# Patient Record
Sex: Female | Born: 1965
Health system: Southern US, Community
[De-identification: ages and names within clinical notes are randomized; demographics above are authoritative.]

## PROBLEM LIST (undated history)

## (undated) DIAGNOSIS — Z923 Personal history of irradiation: Secondary | ICD-10-CM

## (undated) DIAGNOSIS — F329 Major depressive disorder, single episode, unspecified: Secondary | ICD-10-CM

## (undated) DIAGNOSIS — Z86010 Personal history of colonic polyps: Secondary | ICD-10-CM

## (undated) DIAGNOSIS — M1990 Unspecified osteoarthritis, unspecified site: Secondary | ICD-10-CM

## (undated) DIAGNOSIS — R232 Flushing: Secondary | ICD-10-CM

## (undated) DIAGNOSIS — F32A Depression, unspecified: Secondary | ICD-10-CM

## (undated) DIAGNOSIS — C50411 Malignant neoplasm of upper-outer quadrant of right female breast: Secondary | ICD-10-CM

## (undated) DIAGNOSIS — I10 Essential (primary) hypertension: Secondary | ICD-10-CM

## (undated) DIAGNOSIS — T7840XA Allergy, unspecified, initial encounter: Secondary | ICD-10-CM

## (undated) HISTORY — PX: ABDOMINAL HYSTERECTOMY: SHX81

## (undated) HISTORY — DX: Allergy, unspecified, initial encounter: T78.40XA

## (undated) HISTORY — DX: Personal history of irradiation: Z92.3

## (undated) HISTORY — DX: Malignant neoplasm of upper-outer quadrant of right female breast: C50.411

## (undated) HISTORY — DX: Personal history of colonic polyps: Z86.010

## (undated) HISTORY — DX: Flushing: R23.2

## (undated) HISTORY — PX: COLONOSCOPY: SHX174

---

## 1998-09-13 ENCOUNTER — Emergency Department (HOSPITAL_COMMUNITY): Admission: EM | Admit: 1998-09-13 | Discharge: 1998-09-13 | Payer: Self-pay | Admitting: Internal Medicine

## 1998-12-07 ENCOUNTER — Other Ambulatory Visit: Admission: RE | Admit: 1998-12-07 | Discharge: 1998-12-07 | Payer: Self-pay | Admitting: *Deleted

## 1999-11-08 ENCOUNTER — Inpatient Hospital Stay (HOSPITAL_COMMUNITY): Admission: AD | Admit: 1999-11-08 | Discharge: 1999-11-08 | Payer: Self-pay | Admitting: *Deleted

## 1999-11-20 ENCOUNTER — Inpatient Hospital Stay (HOSPITAL_COMMUNITY): Admission: AD | Admit: 1999-11-20 | Discharge: 1999-11-20 | Payer: Self-pay | Admitting: Obstetrics and Gynecology

## 2000-01-05 ENCOUNTER — Other Ambulatory Visit: Admission: RE | Admit: 2000-01-05 | Discharge: 2000-01-05 | Payer: Self-pay | Admitting: *Deleted

## 2000-02-24 ENCOUNTER — Inpatient Hospital Stay (HOSPITAL_COMMUNITY): Admission: AD | Admit: 2000-02-24 | Discharge: 2000-02-24 | Payer: Self-pay | Admitting: Obstetrics and Gynecology

## 2000-03-28 ENCOUNTER — Inpatient Hospital Stay (HOSPITAL_COMMUNITY): Admission: AD | Admit: 2000-03-28 | Discharge: 2000-03-28 | Payer: Self-pay | Admitting: Obstetrics and Gynecology

## 2001-01-17 ENCOUNTER — Emergency Department (HOSPITAL_COMMUNITY): Admission: EM | Admit: 2001-01-17 | Discharge: 2001-01-17 | Payer: Self-pay | Admitting: Emergency Medicine

## 2001-02-27 ENCOUNTER — Other Ambulatory Visit: Admission: RE | Admit: 2001-02-27 | Discharge: 2001-02-27 | Payer: Self-pay | Admitting: *Deleted

## 2001-03-24 ENCOUNTER — Emergency Department (HOSPITAL_COMMUNITY): Admission: EM | Admit: 2001-03-24 | Discharge: 2001-03-24 | Payer: Self-pay | Admitting: Emergency Medicine

## 2001-04-02 ENCOUNTER — Emergency Department (HOSPITAL_COMMUNITY): Admission: EM | Admit: 2001-04-02 | Discharge: 2001-04-02 | Payer: Self-pay | Admitting: Emergency Medicine

## 2002-03-15 ENCOUNTER — Other Ambulatory Visit: Admission: RE | Admit: 2002-03-15 | Discharge: 2002-03-15 | Payer: Self-pay | Admitting: *Deleted

## 2003-08-19 ENCOUNTER — Other Ambulatory Visit: Admission: RE | Admit: 2003-08-19 | Discharge: 2003-08-19 | Payer: Self-pay | Admitting: *Deleted

## 2005-08-15 ENCOUNTER — Other Ambulatory Visit: Admission: RE | Admit: 2005-08-15 | Discharge: 2005-08-15 | Payer: Self-pay | Admitting: Obstetrics and Gynecology

## 2006-05-16 ENCOUNTER — Encounter: Admission: RE | Admit: 2006-05-16 | Discharge: 2006-05-16 | Payer: Self-pay | Admitting: Occupational Medicine

## 2006-08-28 ENCOUNTER — Other Ambulatory Visit: Admission: RE | Admit: 2006-08-28 | Discharge: 2006-08-28 | Payer: Self-pay | Admitting: Obstetrics and Gynecology

## 2006-10-04 ENCOUNTER — Encounter (INDEPENDENT_AMBULATORY_CARE_PROVIDER_SITE_OTHER): Payer: Self-pay | Admitting: Obstetrics and Gynecology

## 2006-10-04 ENCOUNTER — Ambulatory Visit (HOSPITAL_COMMUNITY): Admission: RE | Admit: 2006-10-04 | Discharge: 2006-10-04 | Payer: Self-pay | Admitting: Obstetrics and Gynecology

## 2008-05-15 ENCOUNTER — Encounter (INDEPENDENT_AMBULATORY_CARE_PROVIDER_SITE_OTHER): Payer: Self-pay | Admitting: Obstetrics and Gynecology

## 2008-05-15 ENCOUNTER — Ambulatory Visit (HOSPITAL_COMMUNITY): Admission: RE | Admit: 2008-05-15 | Discharge: 2008-05-16 | Payer: Self-pay | Admitting: Obstetrics and Gynecology

## 2009-06-20 ENCOUNTER — Emergency Department (HOSPITAL_BASED_OUTPATIENT_CLINIC_OR_DEPARTMENT_OTHER): Admission: EM | Admit: 2009-06-20 | Discharge: 2009-06-20 | Payer: Self-pay | Admitting: Emergency Medicine

## 2010-05-30 ENCOUNTER — Encounter: Payer: Self-pay | Admitting: Obstetrics and Gynecology

## 2010-07-13 ENCOUNTER — Emergency Department (HOSPITAL_BASED_OUTPATIENT_CLINIC_OR_DEPARTMENT_OTHER)
Admission: EM | Admit: 2010-07-13 | Discharge: 2010-07-13 | Disposition: A | Discharge status: Home / Self Care | Payer: 59 | Attending: Emergency Medicine | Admitting: Emergency Medicine

## 2010-07-13 DIAGNOSIS — I8390 Asymptomatic varicose veins of unspecified lower extremity: Secondary | ICD-10-CM | POA: Insufficient documentation

## 2010-08-23 LAB — DIFFERENTIAL
Eosinophils Absolute: 0.1 10*3/uL (ref 0.0–0.7)
Monocytes Absolute: 0.4 10*3/uL (ref 0.1–1.0)
Neutrophils Relative %: 57 % (ref 43–77)

## 2010-08-23 LAB — COMPREHENSIVE METABOLIC PANEL
ALT: 36 U/L — ABNORMAL HIGH (ref 0–35)
AST: 45 U/L — ABNORMAL HIGH (ref 0–37)
Alkaline Phosphatase: 41 U/L (ref 39–117)
BUN: 13 mg/dL (ref 6–23)
Calcium: 9.4 mg/dL (ref 8.4–10.5)
Glucose, Bld: 93 mg/dL (ref 70–99)
Sodium: 137 mEq/L (ref 135–145)

## 2010-08-23 LAB — CBC
HCT: 33.4 % — ABNORMAL LOW (ref 36.0–46.0)
HCT: 37.3 % (ref 36.0–46.0)
MCHC: 32.8 g/dL (ref 30.0–36.0)
MCHC: 32.8 g/dL (ref 30.0–36.0)
MCV: 82.3 fL (ref 78.0–100.0)
MCV: 82.4 fL (ref 78.0–100.0)
MCV: 83.9 fL (ref 78.0–100.0)
Platelets: 286 10*3/uL (ref 150–400)
RBC: 3.71 MIL/uL — ABNORMAL LOW (ref 3.87–5.11)
RBC: 4.06 MIL/uL (ref 3.87–5.11)
WBC: 11.5 10*3/uL — ABNORMAL HIGH (ref 4.0–10.5)

## 2010-08-23 LAB — BASIC METABOLIC PANEL
BUN: 5 mg/dL — ABNORMAL LOW (ref 6–23)
CO2: 26 mEq/L (ref 19–32)
Chloride: 104 mEq/L (ref 96–112)
Creatinine, Ser: 0.66 mg/dL (ref 0.4–1.2)

## 2010-09-21 NOTE — Op Note (Signed)
[name], Lindsay [name]                 ACCOUNT NO.:  [id number]   MEDICAL RECORD NO.:  [id number]          PATIENT TYPE:  OIB   LOCATION:  9318                          FACILITY:  [facility]   PHYSICIAN:  Malachi Pro. Ambrose Mantle, M.D. DATE OF BIRTH:  [birth year]   DATE OF PROCEDURE:  05/15/2008  DATE OF DISCHARGE:                               OPERATIVE REPORT   PREOPERATIVE DIAGNOSES:  Menorrhagia, dysmenorrhea, abnormal uterine  bleeding, fibroids, probable adenomyosis, prior endometrial ablation.   POSTOPERATIVE DIAGNOSES:  Menorrhagia, dysmenorrhea, abnormal uterine  bleeding, fibroids, probable adenomyosis, prior endometrial ablation.   OPERATION:  Vaginal hysterectomy.   OPERATOR:  Malachi Pro. Ambrose Mantle, MD   ASSISTANT:  Zenaida Niece, MD   ANESTHESIA:  General anesthesia.   The patient was noted to have a low potassium of 2.8, so she was begun  on potassium intravenously.  She was brought to the operating room,  placed under satisfactory general anesthesia, and placed in lithotomy  position.  The vulva, vagina, perineum, and medial thighs were prepped  with Betadine solution.  Urethra was prepped and a Foley catheter was  inserted to straight drain.  Exam revealed the uterus to be mobile,  posterior upper limit of normal size, the adnexa were free of masses.  The area was draped as a sterile field.  The cervix was identified,  grasped anteriorly and posteriorly with Lahey clamps, and a dilute  solution of Neo-Synephrine was injected at the cervicovaginal junction.  A circumferential incision was made around the cervix.  The anterior  vaginal mucosa was pushed ahead.  A plane was developed over the  anterior peritoneum.  I made one attempt to enter.  I did not enter the  peritoneum, turned my attention posteriorly, and was able to enter the  posterior cul-de-sac.  I clamped, cut, and suture ligated both  uterosacral ligaments and cardinal ligaments.  I then clamped, cut, and  suture ligated  the parametrial tissues still above the uterine vessels,  inverted the uterus through the incision and the cul-de-sac, and clamped  across both upper pedicles requiring two clamps on the right and one on  the left.  The uterus was removed.  It did seem to be one and a half  times normal size and slightly irregular with what was thought to be  fibroids.  The ovaries dropped into the operative field and were  actually somewhat of a burden to keep out of the way.  The upper  pedicles that have been clamped across were doubly suture ligated.  Hemostasis was achieved just at the mesovarium with two  sutures of 3-0  Vicryl.  Additional hemostasis was obtained on both sides with  interrupted figure-of-eight sutures of 0 Vicryl.  I had placed a Bonnano  retractor in when I got into the peritoneal cavity.  I removed it and  sutured the posterior vaginal mucosa to the posterior peritoneum from  uterosacral ligament.  I then searched for more hemostasis, realized  that some of the bleeding was coming from the anterior vagina.  This was  controlled and hemostasis seemed complete.  The anterior peritoneum was  then sutured in sort of a pursestring fashion starting anteriorly and  suturing that to the uterosacral ligaments bilaterally back to the  anterior peritoneum.  This was tied down closing the peritoneal cavity.  The vaginal mucosa was then reunited in the midline after the  uterosacral ligaments were tied  together, and after the figure-of-eight sutures in a vertical fashion,  closed the vaginal cuff.  Hemostasis was complete, the procedure was  terminated, and the patient was returned to recovery in satisfactory  condition. The ovaries were normal.      Malachi Pro. Ambrose Mantle, M.D.  Electronically Signed     TFH/MEDQ  D:  05/15/2008  T:  05/16/2008  Job:  [id number]

## 2010-09-21 NOTE — H&P (Signed)
Lindsay [name], Lindsay [name]                 ACCOUNT NO.:  [id number]   MEDICAL RECORD NO.:  [id number]          PATIENT TYPE:  AMB   LOCATION:  SDC                           FACILITY:  [facility]   PHYSICIAN:  Malachi Pro. Ambrose Mantle, M.D. DATE OF BIRTH:  07/15/1965   DATE OF ADMISSION:  05/15/2008  DATE OF DISCHARGE:                              HISTORY & PHYSICAL   HISTORY OF PRESENT ILLNESS:  This is a 45 year old black married female,  para 3-0-0-3, who is admitted to the hospital for hysterectomy because  of significant dysmenorrhea, menorrhagia and abnormal uterine bleeding  after an unsuccessful endometrial ablation in 2008.  Last menstrual  period was May 03, 2008, lasted 9 days.  The previous period  approximately March 23, 2008, lasted approximately 9 days.  The  patient states that her bleeding is heavier than before she had the  ablation and the ablation was done because of heavy periods.  She states  that her periods interfere with her life.  She cannot predict when her  menses will come.  She bleeds onto her clothes and bleeds onto her bed  sheets.  She states that her periods usually last about 9 days, but  there has been intramenstrual bleeding.  She rates her pain with her  periods as moderate.  She does not have dyspareunia, although she has  not had sex in months.  I initially saw her in March 2009, at which time  she complained of spotting and bleeding for 2 weeks.  Examination showed  a slightly enlarged uterus that was posterior.  Pap smear was normal and  STD panel was negative.  She declined an endometrial biopsy, but  returned in April 2009, for an endometrial biopsy, but I could not admit  a #7, #8 or #9 dilator, and I could not admit a Pipelle into her  endometrial cavity.  She then underwent an ultrasound exam which showed  two small fibroids, one 2.5 x 2.5 cm and another approximately 2 cm and  an endometrial thickness of 2.36 mm.  I viewed the ultrasound pictures  with  her and offered her three options; observation for recurrent  bleeding abnormalities, D and C under anesthesia to see if a cervical  canal could be established or hysterectomy.  She called later and wanted  to proceed with hysterectomy.   PAST MEDICAL HISTORY:   ALLERGIES:  NO KNOWN DRUG ALLERGIES.   PAST SURGICAL HISTORY:  1. Endometrial ablation.  2. Tubal ligation.   ILLNESSES:  High blood pressure.   REVIEW OF SYSTEMS:  No heart, lung, bowel or urinary problems.  She does  not drink or smoke.  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   FAMILY HISTORY:  Father died with an unknown history.  Her mother is 63  and healthy.  She has three siblings, 40, 41 and 43, all healthy.  Three  children ages 9 to 109 are healthy.   MEDICATIONS:  Hydrochlorothiazide and formerly she was on lisinopril,  and it is possible that she is still on lisinopril.  I will have  to  confirm that at the time of admission.   PHYSICAL EXAMINATION:  GENERAL:  A well-developed obese black female in  no distress.  VITAL SIGNS:  Blood pressure 150/90, pulse 99, weight is 218.5 pounds.  HEENT:  Revealed prominent eyes, but extraocular movements were intact.  Nose and pharynx are clear.  NECK:  Supple without thyromegaly.  BREASTS:  Soft without masses.  HEART:  Normal size and sounds, no murmurs.  LUNGS:  Clear to auscultation.  The patient does have a narrow oral  cavity.  ABDOMEN:  Soft and obese.  No masses are palpable.  Liver, spleen and  kidneys are not felt.  There is no tenderness.  Vulva and vagina are  clean.  The cervix is clean.  Uterus is posterior, upper limit of normal  size.  The adnexa are clear.  Rectovaginal exam confirms.  No cul-de-sac  scarring present.   ADMITTING IMPRESSION:  Persistent menorrhagia and dysmenorrhea, abnormal  uterine bleeding, status post ablation.  Hypertension.  Probable  adenomyosis and small fibroids.  The patient is admitted for vaginal  hysterectomy.  If  the surgery cannot be completed vaginally, I will  proceed with abdominal hysterectomy.  The patient has been counseled  about the risks of surgery, including, but not limited to heart attack,  stroke, pulmonary embolus, wound disruption, hemorrhage with the need  for reoperation and/or transfusion, fistula formation, nerve injury and  intestinal obstruction.  She has also been counseled about the fact that  the surgery could have an unpredictable impact on her sex drive and  performance.      Malachi Pro. Ambrose Mantle, M.D.  Electronically Signed     TFH/MEDQ  D:  05/14/2008  T:  05/14/2008  Job:  [id number]

## 2010-09-21 NOTE — Discharge Summary (Signed)
Lindsay [name], Lindsay [name]                 ACCOUNT NO.:  [id number]   MEDICAL RECORD NO.:  [id number]          PATIENT TYPE:  OIB   LOCATION:  9318                          [facility]ACILITY:  [facility]   PHYSICIAN:  Malachi Pro. Ambrose Mantle, M.D. DATE OF BIRTH:  [birth year]   DATE OF ADMISSION:  05/15/2008  DATE OF DISCHARGE:                               DISCHARGE SUMMARY   A 45 year old black female who was admitted with menorrhagia,  dysmenorrhea, abnormal uterine bleeding, fibroids, probable adenomyosis,  and prior endometrial ablation for vaginal hysterectomy.  The patient  underwent a vaginal hysterectomy by Dr. Ambrose Mantle with Dr. Jackelyn Knife  assisting under general anesthesia.  The uterus was thought to be about  1-1/2 times normal size felt like it had small fibroids.  These could  have been adenomyomas.  The proximal portion of the left tube was also  removed.  Postoperatively, the patient did well.  On the first postop  day, she ambulated well, tolerated a diet, voided well, and was ready  for discharge.   Laboratory data on admission showed a potassium of 2.8.  Her SGOT and PT  were 45 and 36, bilirubin was 1.  Her white count was 5400, hemoglobin  12.2, hematocrit 37.3, platelet count 362,000, normal differential.  Pregnancy test was negative.  Followup hemoglobin the evening of the  surgery was 11.1, potassium was 3.8 on the first postop day and  hemoglobin on the first postop day was 10.2, hematocrit 31.1.  Pathology  report is pending.  The ovaries appeared normal at the time of the  surgery.  Tube showed evidence of prior tubal ligation.   FINAL DIAGNOSES:  Menorrhagia, dysmenorrhea, abnormal uterine bleeding,  probable fibroids, probable adenomyosis, history of endometrial  ablation.   OPERATION:  Vaginal hysterectomy and removal of proximal segment of the  left fallopian tube.   FINAL CONDITION:  Improved.   INSTRUCTIONS:  Our regular discharge instructions.  No vaginal entrance,  no heavy  lifting or strenuous activity.  Call with temperature elevation  greater than 100.4 degrees.  Call with heavy vaginal bleeding.   MEDICATIONS:  1. Percocet 5/325, 36 tablets 1 every 4-6 hours as needed for pain.  2. Motrin 600 mg 30 tablets 1 every 6 hours as needed for pain.   The patient is advised to inform her medical doctor that her potassium  was 2.8 on admission and the consideration should be given to using  potassium supplementation along with the hydrochlorothiazide.  The  patient is to return in 1-2 weeks for followup examination.      Malachi Pro. Ambrose Mantle, M.D.  Electronically Signed     TFH/MEDQ  D:  05/16/2008  T:  05/17/2008  Job:  [id number]

## 2010-09-24 NOTE — H&P (Signed)
Lindsay [name], Lindsay [name]                 ACCOUNT NO.:  [id number]   MEDICAL RECORD NO.:  [id number]          PATIENT TYPE:  AMB   LOCATION:  SDC                           [facility]ACILITY:  [facility]   PHYSICIAN:  James A. Ashley Royalty, M.D.DATE OF BIRTH:  1965-07-20   DATE OF ADMISSION:  DATE OF DISCHARGE:                              HISTORY & PHYSICAL   HISTORY OF PRESENT ILLNESS:  The patient is a 45 year old gravida 3 para  3 who complains of menorrhagia and metrorrhagia.  She is status post  tubal sterilization procedure.   Ultrasound was performed on August 31, 2006, and revealed several small  fibroids, the largest of which was 2.6 cm in greatest diameter.  The  patient also had a 2.2-cm echo-free right adnexal cyst.  A  sonohysterogram revealed no significant structural abnormality within  the uterine cavity.  The patient is for diagnostic hysteroscopy with  NovaSure endometrial ablation.   MEDICATIONS:  Hydrochlorothiazide.   PAST MEDICAL HISTORY:  Hypertension.   PAST SURGICAL HISTORY:  Negative.   ALLERGIES:  None.   FAMILY HISTORY:  Positive for breast cancer and diabetes.   SOCIAL HISTORY:  The patient denies use of tobacco or significant  alcohol.   REVIEW OF SYSTEMS:  Noncontributory.   PHYSICAL EXAMINATION:  GENERAL:  A well-developed, well-nourished,  pleasant black female in no acute distress.  VITAL SIGNS:  Afebrile.  Vital signs stable.  CHEST:  Lungs are clear.  CARDIAC:  Regular rate and rhythm.  ABDOMEN:  Soft and nontender.  PELVIC:  External genitalia within normal limits.  Vagina and cervix are  without gross lesions.  Bimanual examination reveals the uterus to be  approximately 9 x 6 x 6 cm and no adnexal masses are palpable.  Please  most recent office evaluation.   IMPRESSION:  1. Fibroid uterus without any obvious submucosal component on      sonohysterogram.  2. Right adnexal cyst - probably physiologic.  3. Menorrhagia/metrorrhagia.  4. Anemia - probably  secondary to #3.   PLAN:  Diagnostic hysteroscopy and NovaSure endometrial ablation.  The  risks, benefits, complications, and alternatives will be discussed with  the patient.  The possibility of operative hysteroscopy was discussed  and accepted.  Questions were invited and answered.      James A. Ashley Royalty, M.D.  Electronically Signed     JAM/MEDQ  D:  10/04/2006  T:  10/04/2006  Job:  [id number]

## 2010-09-24 NOTE — Op Note (Signed)
[name], Lindsay [name]                 ACCOUNT NO.:  [id number]   MEDICAL RECORD NO.:  [id number]          PATIENT TYPE:  AMB   LOCATION:  SDC                           [facility]   PHYSICIAN:  James A. Ashley Royalty, M.D.DATE OF BIRTH:  February 11, 1966   DATE OF PROCEDURE:  10/04/2006  DATE OF DISCHARGE:                               OPERATIVE REPORT   PREOPERATIVE DIAGNOSIS:  Menorrhagia.   POSTOPERATIVE DIAGNOSIS:  Menorrhagia.   PROCEDURE:  1. Diagnostic hysteroscopy.  2. Dilatation curettage.  3. Novasure endometrial ablation.   SURGEON:  Rudy Jew. Ashley Royalty, M.D.   ANESTHESIA:  General, 1% Xylocaine paracervical block.   ESTIMATED BLOOD LOSS:  Minimal.   SPECIMENS:  Uterine curettings.   COMPLICATIONS:  None.   PACKS DRAINS:  None.   PROCEDURE:  The patient is taken to the operating room, placed in the  dorsal supine position.  After general anesthetic was administered, she  was placed in the lithotomy position and prepped and draped in usual  manner for vaginal surgery.  Posterior weighted retractor was placed per  vagina.  The anterior lip of cervix grasped with single-tooth tenaculum.  Uterus was gently sounded to 9 cm and noted to be midplane.  Next a  Hegar dilator was inserted into the cervix and advanced to the level of  the internal os.  Measurement was taken and it was 4.5 cm to the  internal os.  No actual dilatation was required to place the Hegar  dilator.  Next the hysteroscope was placed in the uterine cavity using  sorbitol as a distension medium.  The uterine cavity was thoroughly  inspected.  The left and right tubal ostia were visualized.  The cavity  itself appeared to be without any evidence of structural abnormality.  The cervix was also visualized and also noted to be unremarkable.  Appropriate photographs were obtained.  The uterine cavity was then  flushed with the sodium chloride in preparation for the upcoming  Novasure endometrial ablation.  The  hysteroscope was removed.  Next the  uterine curettage was performed with a medium size curette.  The  curettings were submitted to pathology for histologic studies.   Attention was then turned to the Novasure endometrial ablation.  The  Novasure apparatus was inserted into the uterine cavity for the package  directions.  It was seated appropriately.  The endometrial ablation was  then carried out at a wattage of approximately 87 watts,  width 3.5 cm  and cavity length 4.5 cm.  The ablation lasted approximately 2  minutes.  The instruments were was removed.  Small cervical laceration  was easily closed with 2-0 chromic catgut.  Hemostasis was noted and the  procedure terminated.   The patient tolerated the procedure extremely well and was returned to  the recovery room in good condition.      James A. Ashley Royalty, M.D.  Electronically Signed     JAM/MEDQ  D:  10/04/2006  T:  10/04/2006  Job:  [id number]

## 2012-11-01 ENCOUNTER — Emergency Department (HOSPITAL_BASED_OUTPATIENT_CLINIC_OR_DEPARTMENT_OTHER): Payer: 59

## 2012-11-01 ENCOUNTER — Encounter (HOSPITAL_BASED_OUTPATIENT_CLINIC_OR_DEPARTMENT_OTHER): Payer: Self-pay | Admitting: *Deleted

## 2012-11-01 ENCOUNTER — Emergency Department (HOSPITAL_BASED_OUTPATIENT_CLINIC_OR_DEPARTMENT_OTHER)
Admission: EM | Admit: 2012-11-01 | Discharge: 2012-11-01 | Disposition: A | Discharge status: Home / Self Care | Payer: 59 | Attending: Emergency Medicine | Admitting: Emergency Medicine

## 2012-11-01 DIAGNOSIS — Z79899 Other long term (current) drug therapy: Secondary | ICD-10-CM | POA: Insufficient documentation

## 2012-11-01 DIAGNOSIS — I10 Essential (primary) hypertension: Secondary | ICD-10-CM | POA: Insufficient documentation

## 2012-11-01 DIAGNOSIS — M62838 Other muscle spasm: Secondary | ICD-10-CM | POA: Insufficient documentation

## 2012-11-01 HISTORY — DX: Essential (primary) hypertension: I10

## 2012-11-01 MED ORDER — DIAZEPAM 5 MG PO TABS
5.0000 mg | ORAL_TABLET | Freq: Once | ORAL | Status: AC
  Administered 2012-11-01: 5 mg via ORAL
  Filled 2012-11-01: qty 1

## 2012-11-01 MED ORDER — HYDROCODONE-ACETAMINOPHEN 5-325 MG PO TABS: 1.0000 | ORAL_TABLET | ORAL | Status: DC | PRN

## 2012-11-01 MED ORDER — DIAZEPAM 5 MG PO TABS: 5.0000 mg | ORAL_TABLET | Freq: Two times a day (BID) | ORAL | Status: DC

## 2012-11-01 NOTE — ED Notes (Signed)
Pt c/o neck pain which radiates down into both shoulder  X 4 days

## 2012-11-01 NOTE — ED Provider Notes (Signed)
   History    CSN: [id number] Arrival date & time 11/01/12  1456  First MD Initiated Contact with Patient 11/01/12 1524     Chief Complaint  Patient presents with  . Neck Pain   (Consider location/radiation/quality/duration/timing/severity/associated sxs/prior Treatment) HPI Comments: Pt states that she has been having neck pain for 4 days:no known injury:pt states that she is unable to rotate her shoulder to the left  Patient is a 47 y.o. female presenting with neck pain. The history is provided by the patient. No language interpreter was used.  Neck Pain Pain location:  L side and R side Quality:  Aching Pain radiates to:  Does not radiate Pain severity:  Moderate Pain is:  Same all the time Timing:  Constant Progression:  Unchanged Relieved by:  Nothing  Past Medical History  Diagnosis Date  . Hypertension    Past Surgical History  Procedure Laterality Date  . Abdominal hysterectomy     History reviewed. No pertinent family history. History  Substance Use Topics  . Smoking status: Never Smoker   . Smokeless tobacco: Not on file  . Alcohol Use: No   OB History   Grav Para Term Preterm Abortions TAB SAB Ect Mult Living                 Review of Systems  Constitutional: Negative.   HENT: Positive for neck pain.   Respiratory: Negative.   Cardiovascular: Negative.     Allergies  Review of patient's allergies indicates no known allergies.  Home Medications   Current Outpatient Rx  Name  Route  Sig  Dispense  Refill  . fexofenadine (ALLEGRA) 180 MG tablet   Oral   Take 180 mg by mouth daily.         Marland Kitchen lisinopril-hydrochlorothiazide (PRINZIDE,ZESTORETIC) 20-12.5 MG per tablet   Oral   Take 1 tablet by mouth daily.          BP 140/83  Pulse 90  Temp(Src) 98.6 F (37 C) (Oral)  Resp 16  Ht 5\' 7"  (1.702 m)  Wt 230 lb (104.327 kg)  BMI 36.01 kg/m2  SpO2 100% Physical Exam  Nursing note and vitals reviewed. Constitutional: She is oriented to  person, place, and time. She appears well-developed and well-nourished.  Cardiovascular: Normal rate and regular rhythm.   Pulmonary/Chest: Effort normal and breath sounds normal.  Musculoskeletal:  Cervical paraspinal tenderness with decrease rom  Neurological: She is alert and oriented to person, place, and time.  Skin: Skin is warm and dry.  Psychiatric: She has a normal mood and affect.    ED Course  Procedures (including critical care time) Labs Reviewed - No data to display Dg Cervical Spine Complete  11/01/2012   *RADIOLOGY REPORT*  Clinical Data: Posterior and left-sided neck pain.  CERVICAL SPINE - COMPLETE 4+ VIEW  Comparison: None.  Findings: There is slight reversal of the normal cervical lordosis. Minimal anterior osteophytes at C3, C4, and at C5-6 and C7-T1.  No disc space narrowing.  No facet arthritis or foraminal stenosis. No prevertebral soft tissue swelling.  IMPRESSION: No significant abnormality.   Original Report Authenticated By: Francene Boyers, M.D.   1. Muscle spasms of neck     MDM  Will treat for muscle spasms:pt not having any neuro deficits:will have follow up with DR. Vivi Barrack, NP 11/01/12 1652

## 2012-11-01 NOTE — ED Provider Notes (Signed)
Medical screening examination/treatment/procedure(s) were performed by non-physician practitioner and as supervising physician I was immediately available for consultation/collaboration.   Yancey Pedley, MD 11/01/12 1739 

## 2012-11-03 ENCOUNTER — Emergency Department (HOSPITAL_BASED_OUTPATIENT_CLINIC_OR_DEPARTMENT_OTHER)
Admission: EM | Admit: 2012-11-03 | Discharge: 2012-11-03 | Disposition: A | Discharge status: Home / Self Care | Payer: 59 | Attending: Emergency Medicine | Admitting: Emergency Medicine

## 2012-11-03 ENCOUNTER — Encounter (HOSPITAL_BASED_OUTPATIENT_CLINIC_OR_DEPARTMENT_OTHER): Payer: Self-pay | Admitting: *Deleted

## 2012-11-03 DIAGNOSIS — M7912 Myalgia of auxiliary muscles, head and neck: Secondary | ICD-10-CM

## 2012-11-03 DIAGNOSIS — Z79899 Other long term (current) drug therapy: Secondary | ICD-10-CM | POA: Insufficient documentation

## 2012-11-03 DIAGNOSIS — M62838 Other muscle spasm: Secondary | ICD-10-CM | POA: Insufficient documentation

## 2012-11-03 DIAGNOSIS — I10 Essential (primary) hypertension: Secondary | ICD-10-CM | POA: Insufficient documentation

## 2012-11-03 MED ORDER — NAPROXEN 375 MG PO TABS: 375.0000 mg | ORAL_TABLET | Freq: Two times a day (BID) | ORAL | Status: DC | PRN

## 2012-11-03 MED ORDER — HYDROMORPHONE HCL PF 1 MG/ML IJ SOLN
1.0000 mg | Freq: Once | INTRAMUSCULAR | Status: AC
  Administered 2012-11-03: 1 mg via INTRAMUSCULAR
  Filled 2012-11-03: qty 1

## 2012-11-03 MED ORDER — KETOROLAC TROMETHAMINE 15 MG/ML IJ SOLN
15.0000 mg | Freq: Once | INTRAMUSCULAR | Status: AC
  Administered 2012-11-03: 15 mg via INTRAMUSCULAR
  Filled 2012-11-03: qty 1

## 2012-11-03 NOTE — ED Notes (Signed)
Neck pain, was seen a few days ago and meds are not working

## 2012-11-03 NOTE — ED Provider Notes (Signed)
History    47 year old female with back pain. Gradual onset a few days ago. Pain is in the left neck. It is worse with movement. Mild ache at rest. Patient is seen in the emergency room and prescribed Vicodin and Valium. She's been taking this with only mild relief. She denies any trauma. No fevers or chills. No numbness, tingling or loss of strength. No history of cardiac surgery. No use of blood thinning medication.  CSN: [id number] Arrival date & time 11/03/12  0940  First MD Initiated Contact with Patient 11/03/12 1006     Chief Complaint  Patient presents with  . Neck Pain   (Consider location/radiation/quality/duration/timing/severity/associated sxs/prior Treatment) HPI Past Medical History  Diagnosis Date  . Hypertension    Past Surgical History  Procedure Laterality Date  . Abdominal hysterectomy     No family history on file. History  Substance Use Topics  . Smoking status: Never Smoker   . Smokeless tobacco: Not on file  . Alcohol Use: No   OB History   Grav Para Term Preterm Abortions TAB SAB Ect Mult Living                 Review of Systems All systems reviewed and negative, other than as noted in HPI.   Allergies  Review of patient's allergies indicates no known allergies.  Home Medications   Current Outpatient Rx  Name  Route  Sig  Dispense  Refill  . diazepam (VALIUM) 5 MG tablet   Oral   Take 1 tablet (5 mg total) by mouth 2 (two) times daily.   10 tablet   0   . fexofenadine (ALLEGRA) 180 MG tablet   Oral   Take 180 mg by mouth daily.         Marland Kitchen HYDROcodone-acetaminophen (NORCO/VICODIN) 5-325 MG per tablet   Oral   Take 1 tablet by mouth every 4 (four) hours as needed for pain.   10 tablet   0   . lisinopril-hydrochlorothiazide (PRINZIDE,ZESTORETIC) 20-12.5 MG per tablet   Oral   Take 1 tablet by mouth daily.          BP 141/79  Pulse 66  Resp 18  SpO2 100% Physical Exam  Nursing note and vitals reviewed. Constitutional:  She is oriented to person, place, and time. She appears well-developed and well-nourished. No distress.  HENT:  Head: Normocephalic and atraumatic.  Tenderness along the left sternocleidomastoid muscle. No overlying skin changes. No adenopathy. No nuchal rigidity. Carotid arteries palpable bilaterally and symmetric. No bruit or thrill. Posterior pharynx is clear. Normal stomach phonation. Handling secretions. Submental tissues are soft. No tongue elevation. No midline spinal tenderness. Patient reports increased neck pain with flexion and rotation to the right.  Eyes: Conjunctivae are normal. Right eye exhibits no discharge. Left eye exhibits no discharge.  Neck: Neck supple.  Cardiovascular: Normal rate, regular rhythm and normal heart sounds.  Exam reveals no gallop and no friction rub.   No murmur heard. Pulmonary/Chest: Effort normal and breath sounds normal. No respiratory distress.  Abdominal: Soft. She exhibits no distension. There is no tenderness.  Musculoskeletal: She exhibits no edema and no tenderness.  Neurological: She is alert and oriented to person, place, and time. No cranial nerve deficit. She exhibits normal muscle tone. Coordination normal.  Strength is 5 out of 5 viral upper extremities. Biceps reflexes 1+ bilaterally. Easily palpable radial pulses bilaterally which feel symmetric.  Skin: Skin is warm and dry.  Psychiatric: She has a normal  mood and affect. Her behavior is normal. Thought content normal.    ED Course  Procedures (including critical care time) Labs Reviewed - No data to display Dg Cervical Spine Complete  11/01/2012   *RADIOLOGY REPORT*  Clinical Data: Posterior and left-sided neck pain.  CERVICAL SPINE - COMPLETE 4+ VIEW  Comparison: None.  Findings: There is slight reversal of the normal cervical lordosis. Minimal anterior osteophytes at C3, C4, and at C5-6 and C7-T1.  No disc space narrowing.  No facet arthritis or foraminal stenosis. No prevertebral soft  tissue swelling.  IMPRESSION: No significant abnormality.   Original Report Authenticated By: Francene Boyers, M.D.   1. Sternocleidomastoid muscle tenderness     MDM  47yF with continued neck pain. Tenderness along course of L sternocleidomastoid. No nuchal rigidity. Nonfocal neuro exam. No trauma. Suspicion for emergent etiology such as meningitis, carotid/vertebral artery dissection, deep space neck infection, etc. Is low. Plan continued symptomatic tx. Return precautions discussed.   Raeford Razor, MD 11/05/12 [phone]

## 2012-11-06 ENCOUNTER — Ambulatory Visit (INDEPENDENT_AMBULATORY_CARE_PROVIDER_SITE_OTHER): Payer: 59 | Admitting: Family Medicine

## 2012-11-06 ENCOUNTER — Encounter: Payer: Self-pay | Admitting: Family Medicine

## 2012-11-06 VITALS — BP 120/83 | HR 69 | Ht 67.0 in | Wt 220.0 lb

## 2012-11-06 DIAGNOSIS — M542 Cervicalgia: Secondary | ICD-10-CM | POA: Insufficient documentation

## 2012-11-06 NOTE — Assessment & Plan Note (Signed)
2/2 cervical strain.  Start advil regularly for 1 week with food.  Simple ROM exercises.  Will wait over next week before starting PT as she feels a lot better today.  Discussed ergonomic issues.  F/u prn.

## 2012-11-06 NOTE — Patient Instructions (Addendum)
You've suffered a cervical strain. Take advil 600mg  three times a day with food x 1 week then as needed. If not improving call me - we can keep you out of work longer and add physical therapy. Muscle relaxants and pain medication hasn't seemed to help you and don't make you better faster from this. Consider cervical collar if severely painful. Simple range of motion exercises within limits of pain to prevent further stiffness. Heat 15 minutes at a time 3-4 times a day to help with spasms. Watch head position when on computers, texting, when sleeping in bed - should in line with back to prevent further nerve traction and irritation. Follow up with me as needed.

## 2012-11-06 NOTE — Progress Notes (Signed)
Patient ID: Lindsay [name], female   DOB: 04/06/1966, 47 y.o.   MRN: [id number]  PCP: Aura Dials, MD  Subjective:   HPI: Patient is a 47 y.o. female here for neck pain.  Patient reports 8 days ago she woke up with pain in both sides of neck L > R. Thought she slept wrong but pain intensified over next few days causing a couple visits to the ED. Was given valium, hydrocodone and neither of these helped much. Advil has helped some. No radiation of pain into arms. No numbness or tingling. No prior neck problems. No bowel/bladder dysfunction. Feels much better this morning compared to the past 2 days.  Past Medical History  Diagnosis Date  . Hypertension     Current Outpatient Prescriptions on File Prior to Visit  Medication Sig Dispense Refill  . lisinopril-hydrochlorothiazide (PRINZIDE,ZESTORETIC) 20-12.5 MG per tablet Take 1 tablet by mouth daily.      . diazepam (VALIUM) 5 MG tablet Take 1 tablet (5 mg total) by mouth 2 (two) times daily.  10 tablet  0  . fexofenadine (ALLEGRA) 180 MG tablet Take 180 mg by mouth daily.      Marland Kitchen HYDROcodone-acetaminophen (NORCO/VICODIN) 5-325 MG per tablet Take 1 tablet by mouth every 4 (four) hours as needed for pain.  10 tablet  0  . naproxen (NAPROSYN) 375 MG tablet Take 1 tablet (375 mg total) by mouth 2 (two) times daily as needed.  20 tablet  0   No current facility-administered medications on file prior to visit.    Past Surgical History  Procedure Laterality Date  . Abdominal hysterectomy      No Known Allergies  History   Social History  . Marital Status: Married    Spouse Name: N/A    Number of Children: N/A  . Years of Education: N/A   Occupational History  . Not on file.   Social History Main Topics  . Smoking status: Never Smoker   . Smokeless tobacco: Not on file  . Alcohol Use: No  . Drug Use: No  . Sexually Active: No   Other Topics Concern  . Not on file   Social History Narrative  . No narrative on file     Family History  Problem Relation Age of Onset  . Sudden death Neg Hx   . Hypertension Neg Hx   . Diabetes Neg Hx   . Heart attack Neg Hx   . Hyperlipidemia Neg Hx     BP 120/83  Pulse 69  Ht 5\' 7"  (1.702 m)  Wt 220 lb (99.791 kg)  BMI 34.45 kg/m2  Review of Systems: See HPI above.    Objective:  Physical Exam:  Gen: NAD  Neck: No gross deformity, swelling, bruising. Mild TTP left cervical paraspinal region.  No SCM, other shoulder or neck TTP.  No midline/bony TTP. FROM neck - mild pain on flexion, left lateral rotation. BUE strength 5/5.   Sensation intact to light touch.   2+ equal reflexes in triceps, biceps, brachioradialis tendons. Negative spurlings. NV intact distal BUEs.    Bilateral shoulders: No swelling, ecchymoses.  No gross deformity. No TTP. FROM with negative painful arc. Negative Hawkins. Negative Yergasons. Strength 5/5 with empty can and resisted internal/external rotation. Negative apprehension. NV intact distally.  Assessment & Plan:  1. Neck pain - 2/2 cervical strain.  Start advil regularly for 1 week with food.  Simple ROM exercises.  Will wait over next week before starting PT as she  feels a lot better today.  Discussed ergonomic issues.  F/u prn.

## 2012-11-07 ENCOUNTER — Other Ambulatory Visit: Payer: Self-pay

## 2014-01-31 ENCOUNTER — Encounter: Payer: Self-pay | Admitting: Family Medicine

## 2014-02-11 ENCOUNTER — Encounter: Payer: Self-pay | Admitting: Internal Medicine

## 2014-04-02 ENCOUNTER — Ambulatory Visit (AMBULATORY_SURGERY_CENTER): Payer: Self-pay | Admitting: *Deleted

## 2014-04-02 VITALS — Ht 66.0 in | Wt 226.0 lb

## 2014-04-02 DIAGNOSIS — Z1211 Encounter for screening for malignant neoplasm of colon: Secondary | ICD-10-CM

## 2014-04-02 NOTE — Progress Notes (Signed)
No egg or soy allergy. ewm No issues with sedation. ewm No diet pills, no blood thinners. ewm No home 02 use. ewm emmi video to pt's e mail. ewm

## 2014-04-16 ENCOUNTER — Ambulatory Visit (AMBULATORY_SURGERY_CENTER): Payer: 59 | Admitting: Internal Medicine

## 2014-04-16 ENCOUNTER — Encounter: Payer: Self-pay | Admitting: Internal Medicine

## 2014-04-16 VITALS — BP 127/65 | HR 64 | Temp 98.4°F | Resp 28 | Ht 66.0 in | Wt 226.0 lb

## 2014-04-16 DIAGNOSIS — Z1211 Encounter for screening for malignant neoplasm of colon: Secondary | ICD-10-CM

## 2014-04-16 DIAGNOSIS — D122 Benign neoplasm of ascending colon: Secondary | ICD-10-CM

## 2014-04-16 DIAGNOSIS — D125 Benign neoplasm of sigmoid colon: Secondary | ICD-10-CM

## 2014-04-16 DIAGNOSIS — D124 Benign neoplasm of descending colon: Secondary | ICD-10-CM

## 2014-04-16 MED ORDER — SODIUM CHLORIDE 0.9 % IV SOLN: 500.0000 mL | INTRAVENOUS | Status: DC

## 2014-04-16 NOTE — Patient Instructions (Addendum)
I found and removed 5 small polyps that look benign (not cancer). I will let you know pathology results and when to have another routine colonoscopy by mail.  I appreciate the opportunity to care for you. Gatha Mayer, MD, [facility]  Handout on polyps. Discharge instructions given. Resume previous medications. YOU HAD AN ENDOSCOPIC PROCEDURE TODAY AT [facility]: Refer to the procedure report that was given to you for any specific questions about what was found during the examination.  If the procedure report does not answer your questions, please call your gastroenterologist to clarify.  If you requested that your care partner not be given the details of your procedure findings, then the procedure report has been included in a sealed envelope for you to review at your convenience later.  YOU SHOULD EXPECT: Some feelings of bloating in the abdomen. Passage of more gas than usual.  Walking can help get rid of the air that was put into your GI tract during the procedure and reduce the bloating. If you had a lower endoscopy (such as a colonoscopy or flexible sigmoidoscopy) you may notice spotting of blood in your stool or on the toilet paper. If you underwent a bowel prep for your procedure, then you may not have a normal bowel movement for a few days.  DIET: Your first meal following the procedure should be a light meal and then it is ok to progress to your normal diet.  A half-sandwich or bowl of soup is an example of a good first meal.  Heavy or fried foods are harder to digest and may make you feel nauseous or bloated.  Likewise meals heavy in dairy and vegetables can cause extra gas to form and this can also increase the bloating.  Drink plenty of fluids but you should avoid alcoholic beverages for 24 hours.  ACTIVITY: Your care partner should take you home directly after the procedure.  You should plan to take it easy, moving slowly for the rest of the day.  You can resume  normal activity the day after the procedure however you should NOT DRIVE or use heavy machinery for 24 hours (because of the sedation medicines used during the test).    SYMPTOMS TO REPORT IMMEDIATELY: A gastroenterologist can be reached at any hour.  During normal business hours, 8:30 AM to 5:00 PM Monday through Friday, call [phone].  After hours and on weekends, please call the GI answering service at [phone] who will take a message and have the physician on call contact you.   Following lower endoscopy (colonoscopy or flexible sigmoidoscopy):  Excessive amounts of blood in the stool  Significant tenderness or worsening of abdominal pains  Swelling of the abdomen that is new, acute  Fever of 100F or higher  FOLLOW UP: If any biopsies were taken you will be contacted by phone or by letter within the next 1-3 weeks.  Call your gastroenterologist if you have not heard about the biopsies in 3 weeks.  Our staff will call the home number listed on your records the next business day following your procedure to check on you and address any questions or concerns that you may have at that time regarding the information given to you following your procedure. This is a courtesy call and so if there is no answer at the home number and we have not heard from you through the emergency physician on call, we will assume that you have returned to your regular daily  activities without incident.  SIGNATURES/CONFIDENTIALITY: You and/or your care partner have signed paperwork which will be entered into your electronic medical record.  These signatures attest to the fact that that the information above on your After Visit Summary has been reviewed and is understood.  Full responsibility of the confidentiality of this discharge information lies with you and/or your care-partner.

## 2014-04-16 NOTE — Op Note (Signed)
[place]  [address] [place], [phone]   COLONOSCOPY PROCEDURE REPORT  PATIENT: Lindsay, [name]  MR#: [id number] BIRTHDATE: 1965/12/15 , 48  yrs. old GENDER: female ENDOSCOPIST: Gatha Mayer, MD, [facility] PROCEDURE DATE:  04/16/2014 PROCEDURE:   Colonoscopy with biopsy and Colonoscopy with snare polypectomy First Screening Colonoscopy - Avg.  risk and is 50 yrs.  old or older Yes.  Prior Negative Screening - Now for repeat screening. N/A  History of Adenoma - Now for follow-up colonoscopy & has been > or = to 3 yrs.  N/A  Polyps Removed Today? Yes. ASA CLASS:   Class II INDICATIONS:first colonoscopy and average risk for colorectal cancer. MEDICATIONS: Propofol 400 mg IV and Monitored anesthesia care  DESCRIPTION OF PROCEDURE:   After the risks benefits and alternatives of the procedure were thoroughly explained, informed consent was obtained.  The digital rectal exam revealed no abnormalities of the rectum.   The LB [id number]  endoscope was introduced through the anus and advanced to the cecum, which was identified by both the appendix and ileocecal valve. No adverse events experienced.   The quality of the prep was good, using MiraLax  The instrument was then slowly withdrawn as the colon was fully examined.  COLON FINDINGS: Five polyps were found. A diminutive ascending polyp removed completely with cold forceps. Three descending and 1 sigmoid cold snared and completely removed. All diminutive and all sent to pathology.  The examination was otherwise normal. Retroflexed rectal and right colon views revealed no abnormalities. The time to cecum=2 minutes 04 seconds.  Withdrawal time=14 minutes 49 seconds.  The scope was withdrawn and the procedure completed. COMPLICATIONS: There were no immediate complications.  ENDOSCOPIC IMPRESSION: 1.   Five polyps were found and removed, all diminutive 2.   The examination was otherwise normal w/ good prep -  first screening  RECOMMENDATIONS: Timing of repeat colonoscopy will be determined by pathology findings. eSigned:  Gatha Mayer, MD, [facility] 04/16/2014 9:46 AM cc: Bernerd Limbo, MD and The Patient

## 2014-04-16 NOTE — Progress Notes (Signed)
Called to room to assist during endoscopic procedure.  Patient ID and intended procedure confirmed with present staff. Received instructions for my participation in the procedure from the performing physician.  

## 2014-04-16 NOTE — Progress Notes (Signed)
A/ox3, pleased with MAC, report to RN 

## 2014-04-17 ENCOUNTER — Telehealth: Payer: Self-pay | Admitting: *Deleted

## 2014-04-17 NOTE — Telephone Encounter (Signed)
Message left

## 2014-04-25 ENCOUNTER — Encounter: Payer: Self-pay | Admitting: Internal Medicine

## 2014-04-25 DIAGNOSIS — Z86010 Personal history of colonic polyps: Secondary | ICD-10-CM

## 2014-04-25 DIAGNOSIS — Z860101 Personal history of adenomatous and serrated colon polyps: Secondary | ICD-10-CM

## 2014-04-25 HISTORY — DX: Personal history of adenomatous and serrated colon polyps: Z86.0101

## 2014-04-25 HISTORY — DX: Personal history of colonic polyps: Z86.010

## 2014-04-25 NOTE — Progress Notes (Signed)
Quick Note:  4 small adenomas - repeat colon 2019 ______

## 2014-05-09 HISTORY — PX: KNEE ARTHROSCOPY: SUR90

## 2014-08-27 ENCOUNTER — Emergency Department (HOSPITAL_BASED_OUTPATIENT_CLINIC_OR_DEPARTMENT_OTHER)
Admission: EM | Admit: 2014-08-27 | Discharge: 2014-08-27 | Disposition: A | Discharge status: Home / Self Care | Payer: 59 | Attending: Emergency Medicine | Admitting: Emergency Medicine

## 2014-08-27 ENCOUNTER — Encounter (HOSPITAL_BASED_OUTPATIENT_CLINIC_OR_DEPARTMENT_OTHER): Payer: Self-pay

## 2014-08-27 ENCOUNTER — Emergency Department (HOSPITAL_BASED_OUTPATIENT_CLINIC_OR_DEPARTMENT_OTHER): Payer: 59

## 2014-08-27 DIAGNOSIS — M1990 Unspecified osteoarthritis, unspecified site: Secondary | ICD-10-CM | POA: Diagnosis not present

## 2014-08-27 DIAGNOSIS — Z86010 Personal history of colonic polyps: Secondary | ICD-10-CM | POA: Insufficient documentation

## 2014-08-27 DIAGNOSIS — Z79899 Other long term (current) drug therapy: Secondary | ICD-10-CM | POA: Diagnosis not present

## 2014-08-27 DIAGNOSIS — Z791 Long term (current) use of non-steroidal anti-inflammatories (NSAID): Secondary | ICD-10-CM | POA: Diagnosis not present

## 2014-08-27 DIAGNOSIS — M25562 Pain in left knee: Secondary | ICD-10-CM | POA: Insufficient documentation

## 2014-08-27 DIAGNOSIS — I10 Essential (primary) hypertension: Secondary | ICD-10-CM | POA: Diagnosis not present

## 2014-08-27 HISTORY — DX: Unspecified osteoarthritis, unspecified site: M19.90

## 2014-08-27 MED ORDER — OXYCODONE-ACETAMINOPHEN 5-325 MG PO TABS: 1.0000 | ORAL_TABLET | ORAL | Status: DC | PRN

## 2014-08-27 MED ORDER — NAPROXEN 500 MG PO TABS: 500.0000 mg | ORAL_TABLET | Freq: Two times a day (BID) | ORAL | Status: DC

## 2014-08-27 MED ORDER — LIDOCAINE 5 % EX PTCH: 1.0000 | MEDICATED_PATCH | CUTANEOUS | Status: DC

## 2014-08-27 NOTE — Discharge Instructions (Signed)

## 2014-08-27 NOTE — ED Notes (Signed)
Pt reports left knee pain that started yesterday.  Reports possible injury will working out.  Pt report swelling.  Pt ambulatory in triage.

## 2014-08-27 NOTE — ED Provider Notes (Signed)
CSN: [id number]     Arrival date & time 08/27/14  1954 History   First MD Initiated Contact with Patient 08/27/14 2026     Chief Complaint  Patient presents with  . Knee Pain     (Consider location/radiation/quality/duration/timing/severity/associated sxs/prior Treatment) Patient is a 49 y.o. female presenting with knee pain.  Knee Pain Location:  Knee Time since incident:  1 day Injury: no   Knee location:  L knee Pain details:    Quality:  Aching and pressure   Radiates to:  Does not radiate   Severity:  Moderate   Onset quality:  Gradual   Duration:  1 day   Timing:  Constant   Progression:  Worsening Chronicity:  Recurrent Dislocation: no   Prior injury to area:  No Relieved by:  Nothing Ineffective treatments:  Movement Associated symptoms: decreased ROM, stiffness and swelling   Associated symptoms: no back pain, no fatigue, no fever, no itching, no muscle weakness, no neck pain, no numbness and no tingling   Risk factors comment:  HX of OA. Recently took up [employer]. Has been did  a lot of lunges 2 days ago befor the swelling began.   Past Medical History  Diagnosis Date  . Hypertension   . Allergy     seasonal  . Hx of adenomatous colonic polyps 04/25/2014  . Arthritis    Past Surgical History  Procedure Laterality Date  . Abdominal hysterectomy     Family History  Problem Relation Age of Onset  . Sudden death Neg Hx   . Hypertension Neg Hx   . Diabetes Neg Hx   . Heart attack Neg Hx   . Hyperlipidemia Neg Hx   . Colon cancer Neg Hx   . Esophageal cancer Neg Hx   . Rectal cancer Neg Hx   . Stomach cancer Neg Hx    History  Substance Use Topics  . Smoking status: Never Smoker   . Smokeless tobacco: Never Used  . Alcohol Use: No   OB History    No data available     Review of Systems  Constitutional: Negative for fever and fatigue.  Musculoskeletal: Positive for joint swelling, gait problem and stiffness. Negative for back pain and neck  pain.  Skin: Negative for itching.      Allergies  Pollen extract  Home Medications   Prior to Admission medications   Medication Sig Start Date End Date Taking? Authorizing Provider  EPINEPHrine (EPIPEN 2-PAK) 0.3 mg/0.3 mL IJ SOAJ injection  01/01/14   Historical Provider, MD  fexofenadine (ALLEGRA) 180 MG tablet Take 180 mg by mouth daily.    Historical Provider, MD  lisinopril-hydrochlorothiazide (PRINZIDE,ZESTORETIC) 20-12.5 MG per tablet Take 1 tablet by mouth daily.    Historical Provider, MD  naproxen (NAPROSYN) 500 MG tablet Take 1 tablet (500 mg total) by mouth 2 (two) times daily with a meal. 08/27/14   Kyllian Clingerman, PA-C   BP 125/64 mmHg  Pulse 90  Temp(Src) 98.9 F (37.2 C) (Oral)  Resp 18  Ht 5\' 6"  (1.676 m)  Wt 215 lb (97.523 kg)  BMI 34.72 kg/m2  SpO2 100% Physical Exam  Constitutional: She is oriented to person, place, and time. She appears well-developed and well-nourished. No distress.  HENT:  Head: Normocephalic and atraumatic.  Eyes: Conjunctivae are normal. No scleral icterus.  Neck: Normal range of motion.  Cardiovascular: Normal rate, regular rhythm and normal heart sounds.  Exam reveals no gallop and no friction rub.   No  murmur heard. Pulmonary/Chest: Effort normal and breath sounds normal. No respiratory distress.  Abdominal: Soft. Bowel sounds are normal. She exhibits no distension and no mass. There is no tenderness. There is no guarding.  Musculoskeletal:  Knee exam: left positive for moderate crepitations, some mild tenderness and pain on range of motion, minimal effusion is present, no pseudo laxity noted.   Neurological: She is alert and oriented to person, place, and time.  Skin: Skin is warm and dry. She is not diaphoretic.    ED Course  Procedures (including critical care time) Labs Review Labs Reviewed - No data to display  Imaging Review Dg Knee 2 Views Left  08/27/2014   CLINICAL DATA:  49 year old female with left knee pain   EXAM: LEFT KNEE - 1-2 VIEW  COMPARISON:  None  FINDINGS: No acute fracture, malalignment or knee joint effusion. Tricompartmental osteoarthritis with early osteophyte formation. Suspect chondromalacia patella. No lytic or blastic osseous lesion.  IMPRESSION: No acute fracture or joint effusion.  Mild tricompartmental degenerative osteoarthritis.   Electronically Signed   By: Jacqulynn Cadet M.D.   On: 08/27/2014 21:45     EKG Interpretation None      MDM   Final diagnoses:  Left knee pain    Patient X-Ray negative for obvious fracture or dislocation. Pain managed in ED. Pt advised to follow up with orthopedics if symptoms persist for possibility of missed fracture diagnosis. Patient given brace while in ED, conservative therapy recommended and discussed. Patient will be dc home & is agreeable with above plan.     Margarita Mail, PA-C 09/02/14 [place], MD 09/02/14 [phone]

## 2015-03-03 ENCOUNTER — Emergency Department (HOSPITAL_BASED_OUTPATIENT_CLINIC_OR_DEPARTMENT_OTHER)
Admission: EM | Admit: 2015-03-03 | Discharge: 2015-03-03 | Disposition: A | Discharge status: Home / Self Care | Payer: Managed Care, Other (non HMO) | Attending: Emergency Medicine | Admitting: Emergency Medicine

## 2015-03-03 ENCOUNTER — Emergency Department (HOSPITAL_BASED_OUTPATIENT_CLINIC_OR_DEPARTMENT_OTHER): Payer: Managed Care, Other (non HMO)

## 2015-03-03 ENCOUNTER — Encounter (HOSPITAL_BASED_OUTPATIENT_CLINIC_OR_DEPARTMENT_OTHER): Payer: Self-pay | Admitting: *Deleted

## 2015-03-03 DIAGNOSIS — R110 Nausea: Secondary | ICD-10-CM | POA: Insufficient documentation

## 2015-03-03 DIAGNOSIS — M545 Low back pain, unspecified: Secondary | ICD-10-CM

## 2015-03-03 DIAGNOSIS — Z791 Long term (current) use of non-steroidal anti-inflammatories (NSAID): Secondary | ICD-10-CM | POA: Insufficient documentation

## 2015-03-03 DIAGNOSIS — R109 Unspecified abdominal pain: Secondary | ICD-10-CM | POA: Diagnosis not present

## 2015-03-03 DIAGNOSIS — Z79899 Other long term (current) drug therapy: Secondary | ICD-10-CM | POA: Insufficient documentation

## 2015-03-03 DIAGNOSIS — Z86018 Personal history of other benign neoplasm: Secondary | ICD-10-CM | POA: Insufficient documentation

## 2015-03-03 DIAGNOSIS — I10 Essential (primary) hypertension: Secondary | ICD-10-CM | POA: Diagnosis not present

## 2015-03-03 DIAGNOSIS — M1990 Unspecified osteoarthritis, unspecified site: Secondary | ICD-10-CM | POA: Insufficient documentation

## 2015-03-03 DIAGNOSIS — R10A0 Flank pain, unspecified side: Secondary | ICD-10-CM

## 2015-03-03 LAB — COMPREHENSIVE METABOLIC PANEL
ALBUMIN: 4.5 g/dL (ref 3.5–5.0)
ALK PHOS: 39 U/L (ref 38–126)
ALT: 29 U/L (ref 14–54)
ANION GAP: 7 (ref 5–15)
AST: 36 U/L (ref 15–41)
BILIRUBIN TOTAL: 0.8 mg/dL (ref 0.3–1.2)
BUN: 17 mg/dL (ref 6–20)
CALCIUM: 10 mg/dL (ref 8.9–10.3)
CO2: 28 mmol/L (ref 22–32)
Chloride: 103 mmol/L (ref 101–111)
Creatinine, Ser: 0.69 mg/dL (ref 0.44–1.00)
GFR calc Af Amer: >60 mL/min (ref >60)
GLUCOSE: 126 mg/dL — AB (ref 65–99)
POTASSIUM: 3.6 mmol/L (ref 3.5–5.1)
Sodium: 138 mmol/L (ref 135–145)
TOTAL PROTEIN: 8.3 g/dL — AB (ref 6.5–8.1)

## 2015-03-03 LAB — URINALYSIS, ROUTINE W REFLEX MICROSCOPIC
BILIRUBIN URINE: NEGATIVE
Glucose, UA: NEGATIVE mg/dL
Hgb urine dipstick: NEGATIVE
Ketones, ur: NEGATIVE mg/dL
Leukocytes, UA: NEGATIVE
NITRITE: NEGATIVE
PH: 6 (ref 5.0–8.0)
Protein, ur: NEGATIVE mg/dL
SPECIFIC GRAVITY, URINE: 1.022 (ref 1.005–1.030)
UROBILINOGEN UA: 1 mg/dL (ref 0.0–1.0)

## 2015-03-03 LAB — CBC WITH DIFFERENTIAL/PLATELET
BASOS PCT: 0 %
Basophils Absolute: 0 10*3/uL (ref 0.0–0.1)
Eosinophils Absolute: 0 10*3/uL (ref 0.0–0.7)
Eosinophils Relative: 0 %
HEMATOCRIT: 40.4 % (ref 36.0–46.0)
Hemoglobin: 13.1 g/dL (ref 12.0–15.0)
LYMPHS ABS: 0.9 10*3/uL (ref 0.7–4.0)
LYMPHS PCT: 16 %
MCH: 27.9 pg (ref 26.0–34.0)
MCHC: 32.4 g/dL (ref 30.0–36.0)
MCV: 86 fL (ref 78.0–100.0)
MONO ABS: 0.2 10*3/uL (ref 0.1–1.0)
MONOS PCT: 4 %
NEUTROS ABS: 4.8 10*3/uL (ref 1.7–7.7)
NEUTROS PCT: 80 %
Platelets: 326 10*3/uL (ref 150–400)
RBC: 4.7 MIL/uL (ref 3.87–5.11)
RDW: 12.4 % (ref 11.5–15.5)
WBC: 5.9 10*3/uL (ref 4.0–10.5)

## 2015-03-03 MED ORDER — ONDANSETRON HCL 4 MG/2ML IJ SOLN
4.0000 mg | Freq: Once | INTRAMUSCULAR | Status: AC
  Administered 2015-03-03: 4 mg via INTRAVENOUS
  Filled 2015-03-03: qty 2

## 2015-03-03 MED ORDER — MORPHINE SULFATE (PF) 4 MG/ML IV SOLN
4.0000 mg | Freq: Once | INTRAVENOUS | Status: AC
  Administered 2015-03-03: 4 mg via INTRAVENOUS
  Filled 2015-03-03: qty 1

## 2015-03-03 MED ORDER — METHOCARBAMOL 500 MG PO TABS
500.0000 mg | ORAL_TABLET | Freq: Once | ORAL | Status: AC
  Administered 2015-03-03: 500 mg via ORAL
  Filled 2015-03-03: qty 1

## 2015-03-03 MED ORDER — HYDROCODONE-ACETAMINOPHEN 5-325 MG PO TABS: ORAL_TABLET | ORAL | Status: DC

## 2015-03-03 MED ORDER — HYDROCODONE-ACETAMINOPHEN 5-325 MG PO TABS
1.0000 | ORAL_TABLET | Freq: Once | ORAL | Status: AC
  Administered 2015-03-03: 1 via ORAL
  Filled 2015-03-03: qty 1

## 2015-03-03 MED ORDER — KETOROLAC TROMETHAMINE 30 MG/ML IJ SOLN
30.0000 mg | Freq: Once | INTRAMUSCULAR | Status: AC
  Administered 2015-03-03: 30 mg via INTRAVENOUS
  Filled 2015-03-03: qty 1

## 2015-03-03 MED ORDER — METHOCARBAMOL 500 MG PO TABS: 1000.0000 mg | ORAL_TABLET | Freq: Four times a day (QID) | ORAL | Status: DC

## 2015-03-03 NOTE — ED Provider Notes (Signed)
CSN: [id number]     Arrival date & time 03/03/15  1228 History   First MD Initiated Contact with Patient 03/03/15 1236     Chief Complaint  Patient presents with  . Back Pain     (Consider location/radiation/quality/duration/timing/severity/associated sxs/prior Treatment) HPI Comments: Patient with history of hysterectomy presents with complaint of acute onset of right-sided back pain with radiation to right flank starting an approximate 7 AM. Patient awoke and went to work at 6 AM and felt normal. Symptoms have been associated with nausea, no vomiting. No abdominal pain. No fevers, diarrhea. No vaginal bleeding or discharge. No history of kidney stones. Course is constant. Palpation of the lower back makes the pain a little worse.  Patient is a 49 y.o. female presenting with back pain. The history is provided by the patient.  Back Pain Associated symptoms: no abdominal pain, no chest pain, no dysuria, no fever, no headaches and no pelvic pain     Past Medical History  Diagnosis Date  . Hypertension   . Allergy     seasonal  . Hx of adenomatous colonic polyps 04/25/2014  . Arthritis    Past Surgical History  Procedure Laterality Date  . Abdominal hysterectomy     Family History  Problem Relation Age of Onset  . Sudden death Neg Hx   . Hypertension Neg Hx   . Diabetes Neg Hx   . Heart attack Neg Hx   . Hyperlipidemia Neg Hx   . Colon cancer Neg Hx   . Esophageal cancer Neg Hx   . Rectal cancer Neg Hx   . Stomach cancer Neg Hx    Social History  Substance Use Topics  . Smoking status: Never Smoker   . Smokeless tobacco: Never Used  . Alcohol Use: No   OB History    No data available     Review of Systems  Constitutional: Negative for fever.  HENT: Negative for rhinorrhea and sore throat.   Eyes: Negative for redness.  Respiratory: Negative for cough.   Cardiovascular: Negative for chest pain.  Gastrointestinal: Positive for nausea. Negative for vomiting,  abdominal pain and diarrhea.  Genitourinary: Positive for flank pain. Negative for dysuria, vaginal bleeding, vaginal discharge and pelvic pain.  Musculoskeletal: Positive for back pain. Negative for myalgias.  Skin: Negative for rash.  Neurological: Negative for headaches.      Allergies  Pollen extract  Home Medications   Prior to Admission medications   Medication Sig Start Date End Date Taking? Authorizing Provider  EPINEPHrine (EPIPEN 2-PAK) 0.3 mg/0.3 mL IJ SOAJ injection  01/01/14   Historical Provider, MD  fexofenadine (ALLEGRA) 180 MG tablet Take 180 mg by mouth daily.    Historical Provider, MD  lisinopril-hydrochlorothiazide (PRINZIDE,ZESTORETIC) 20-12.5 MG per tablet Take 1 tablet by mouth daily.    Historical Provider, MD  naproxen (NAPROSYN) 500 MG tablet Take 1 tablet (500 mg total) by mouth 2 (two) times daily with a meal. 08/27/14   Abigail Harris, PA-C   BP 134/72 mmHg  Pulse 75  Temp(Src) 98.8 F (37.1 C) (Oral)  Resp 18  Ht 5\' 6"  (1.676 m)  Wt 210 lb (95.255 kg)  BMI 33.91 kg/m2  SpO2 100% Physical Exam  Constitutional: She appears well-developed and well-nourished. She appears distressed (patient is uncomfortable).  HENT:  Head: Normocephalic and atraumatic.  Eyes: Conjunctivae are normal. Right eye exhibits no discharge. Left eye exhibits no discharge.  Neck: Normal range of motion. Neck supple.  Cardiovascular: Normal rate, regular  rhythm and normal heart sounds.   No murmur heard. Pulmonary/Chest: Effort normal and breath sounds normal. No respiratory distress. She has no wheezes. She has no rales.  Abdominal: Soft. There is no tenderness. There is no rebound and no guarding.  Musculoskeletal:       Cervical back: Normal.       Thoracic back: Normal.       Lumbar back: She exhibits tenderness. She exhibits normal range of motion and no bony tenderness.       Back:  Neurological: She is alert.  Skin: Skin is warm and dry.  Psychiatric: She has a  normal mood and affect.  Nursing note and vitals reviewed.   ED Course  Procedures (including critical care time) Labs Review Labs Reviewed  COMPREHENSIVE METABOLIC PANEL - Abnormal; Notable for the following:    Glucose, Bld 126 (*)    Total Protein 8.3 (*)    All other components within normal limits  URINALYSIS, ROUTINE W REFLEX MICROSCOPIC (NOT AT [facility])  CBC WITH DIFFERENTIAL/PLATELET    Imaging Review Ct Renal Stone Study  03/03/2015  CLINICAL DATA:  Bilateral flank pain, nausea EXAM: CT ABDOMEN AND PELVIS WITHOUT CONTRAST TECHNIQUE: Multidetector CT imaging of the abdomen and pelvis was performed following the standard protocol without IV contrast. COMPARISON:  None. FINDINGS: Lung bases are unremarkable. Sagittal images of the spine shows degenerative changes lower thoracic spine. No calcified gallstones are noted within gallbladder. Question gallbladder sludge. Unenhanced liver, spleen, pancreas and adrenal glands are unremarkable. Unenhanced kidneys are symmetrical in size. No hydronephrosis or hydroureter. No nephrolithiasis. No calcified ureteral calculi. Right colon diverticula are noted. There is no evidence of acute diverticulitis. No pericecal inflammation. Normal retrocecal appendix noted in axial image 56. The terminal ileum is unremarkable. There is no evidence of acute colitis. No small bowel obstruction. The uterus is surgically absent. No adnexal masses noted. The urinary bladder is under distended. Pelvic phleboliths are noted. IMPRESSION: 1. No nephrolithiasis.  No hydronephrosis or hydroureter. 2. No calcified ureteral calculi. 3. No pericecal inflammation.  Normal appendix. 4. Surgical absent uterus. 5. No calcified calculi are noted within under distended urinary bladder. Electronically Signed   By: Lahoma Crocker M.D.   On: 03/03/2015 13:51   I have personally reviewed and evaluated these images and lab results as part of my medical decision-making.   EKG  Interpretation None       12:48 PM Patient seen and examined. Work-up initiated. Medications ordered. Patient appears very uncomfortable. She is moaning.   Vital signs reviewed and are as follows: BP 134/72 mmHg  Pulse 75  Temp(Src) 98.8 F (37.1 C) (Oral)  Resp 18  Ht 5\' 6"  (1.676 m)  Wt 210 lb (95.255 kg)  BMI 33.91 kg/m2  SpO2 100%  2:00 PM Morphine helped temporarily. Toradol ordered. CT ordered to eval for stone given no clear explanation for pain. This is negative. Pt and husband updated. Toradol worked slightly. Will transition to PO pain medications and muscle relaxer. Likely will treat as MSK pain and have patient f/u with PCP.   3:42 PM patient updated on results. She is feeling somewhat better but continues to have pain. Will discharge to home is denied treatment. Encouraged PCP follow-up in the next 2-3 days. Return to the emergency department with worsening uncontrolled pain, vomiting, fever, blood in stool, weakness in lower extremities, trouble walking, or other concerns.  MDM   Final diagnoses:  Flank pain  Right-sided low back pain without sciatica  Patient with back/flank pain. Workup here is reassuring. No obvious etiology for pain discovered here. Suspect musculoskeletal in nature. No red flag signs and symptoms of lower back pain. Treatment as above.   Carlisle Cater, PA-C 03/03/15 1543  Evelina Bucy, MD 03/05/15 [phone]

## 2015-03-03 NOTE — ED Notes (Signed)
Back pain and nausea since this am. Moaning.

## 2015-03-03 NOTE — Discharge Instructions (Signed)
Please read and follow all provided instructions.  Your diagnoses today include:  1. Right-sided low back pain without sciatica   2. Flank pain     Tests performed today include:  Vital signs - see below for your results today  Blood counts and electrolytes - normal  Urine test-no infection  CT scan of abdomen and pelvis looking for kidney stone-no stone or other problems  Medications prescribed:   Robaxin (methocarbamol) - muscle relaxer medication  DO NOT drive or perform any activities that require you to be awake and alert because this medicine can make you drowsy.    Vicodin (hydrocodone/acetaminophen) - narcotic pain medication  DO NOT drive or perform any activities that require you to be awake and alert because this medicine can make you drowsy. BE VERY CAREFUL not to take multiple medicines containing Tylenol (also called acetaminophen). Doing so can lead to an overdose which can damage your liver and cause liver failure and possibly death.  Take any prescribed medications only as directed.  Home care instructions:   Follow any educational materials contained in this packet  Please rest, use ice or heat on your back for the next several days  Do not lift, push, pull anything more than 10 pounds for the next week  Follow-up instructions: Please follow-up with your primary care provider in the next 1 week for further evaluation of your symptoms.   Return instructions:  SEEK IMMEDIATE MEDICAL ATTENTION IF YOU HAVE:  New numbness, tingling, weakness, or problem with the use of your arms or legs  Severe back pain not relieved with medications  Loss control of your bowels or bladder  Increasing pain in any areas of the body (such as chest or abdominal pain)  Shortness of breath, dizziness, or fainting.   Worsening nausea (feeling sick to your stomach), vomiting, fever, or sweats  Any other emergent concerns regarding your health   Additional  Information:  Your vital signs today were: BP 138/67 mmHg   Pulse 71   Temp(Src) 98.8 F (37.1 C) (Oral)   Resp 18   Ht 5\' 6"  (1.676 m)   Wt 210 lb (95.255 kg)   BMI 33.91 kg/m2   SpO2 95% If your blood pressure (BP) was elevated above 135/85 this visit, please have this repeated by your doctor within one month. --------------

## 2015-07-24 ENCOUNTER — Emergency Department (HOSPITAL_BASED_OUTPATIENT_CLINIC_OR_DEPARTMENT_OTHER)
Admission: EM | Admit: 2015-07-24 | Discharge: 2015-07-24 | Disposition: A | Discharge status: Home / Self Care | Payer: Managed Care, Other (non HMO) | Attending: Emergency Medicine | Admitting: Emergency Medicine

## 2015-07-24 ENCOUNTER — Encounter (HOSPITAL_BASED_OUTPATIENT_CLINIC_OR_DEPARTMENT_OTHER): Payer: Self-pay

## 2015-07-24 ENCOUNTER — Emergency Department (HOSPITAL_BASED_OUTPATIENT_CLINIC_OR_DEPARTMENT_OTHER): Payer: Managed Care, Other (non HMO)

## 2015-07-24 DIAGNOSIS — N281 Cyst of kidney, acquired: Secondary | ICD-10-CM | POA: Insufficient documentation

## 2015-07-24 DIAGNOSIS — M25511 Pain in right shoulder: Secondary | ICD-10-CM | POA: Insufficient documentation

## 2015-07-24 DIAGNOSIS — R51 Headache: Secondary | ICD-10-CM | POA: Insufficient documentation

## 2015-07-24 DIAGNOSIS — Z90710 Acquired absence of both cervix and uterus: Secondary | ICD-10-CM | POA: Diagnosis not present

## 2015-07-24 DIAGNOSIS — Z8739 Personal history of other diseases of the musculoskeletal system and connective tissue: Secondary | ICD-10-CM | POA: Diagnosis not present

## 2015-07-24 DIAGNOSIS — I10 Essential (primary) hypertension: Secondary | ICD-10-CM | POA: Diagnosis not present

## 2015-07-24 DIAGNOSIS — Z86010 Personal history of colonic polyps: Secondary | ICD-10-CM | POA: Insufficient documentation

## 2015-07-24 DIAGNOSIS — M25512 Pain in left shoulder: Secondary | ICD-10-CM | POA: Diagnosis not present

## 2015-07-24 DIAGNOSIS — K8020 Calculus of gallbladder without cholecystitis without obstruction: Secondary | ICD-10-CM | POA: Diagnosis not present

## 2015-07-24 DIAGNOSIS — R1011 Right upper quadrant pain: Secondary | ICD-10-CM

## 2015-07-24 DIAGNOSIS — Z79899 Other long term (current) drug therapy: Secondary | ICD-10-CM | POA: Diagnosis not present

## 2015-07-24 LAB — COMPREHENSIVE METABOLIC PANEL
ALBUMIN: 4 g/dL (ref 3.5–5.0)
ALT: 34 U/L (ref 14–54)
ANION GAP: 10 (ref 5–15)
AST: 31 U/L (ref 15–41)
Alkaline Phosphatase: 46 U/L (ref 38–126)
BILIRUBIN TOTAL: 0.6 mg/dL (ref 0.3–1.2)
BUN: 20 mg/dL (ref 6–20)
CALCIUM: 8.9 mg/dL (ref 8.9–10.3)
CO2: 28 mmol/L (ref 22–32)
Chloride: 99 mmol/L — ABNORMAL LOW (ref 101–111)
Creatinine, Ser: 0.74 mg/dL (ref 0.44–1.00)
Glucose, Bld: 104 mg/dL — ABNORMAL HIGH (ref 65–99)
POTASSIUM: 3.4 mmol/L — AB (ref 3.5–5.1)
Sodium: 137 mmol/L (ref 135–145)
TOTAL PROTEIN: 7.4 g/dL (ref 6.5–8.1)

## 2015-07-24 LAB — CBC WITH DIFFERENTIAL/PLATELET
Basophils Absolute: 0 10*3/uL (ref 0.0–0.1)
Basophils Relative: 0 %
EOS ABS: 0.1 10*3/uL (ref 0.0–0.7)
Eosinophils Relative: 1 %
HCT: 36.8 % (ref 36.0–46.0)
HEMOGLOBIN: 11.4 g/dL — AB (ref 12.0–15.0)
LYMPHS PCT: 46 %
Lymphs Abs: 3.5 10*3/uL (ref 0.7–4.0)
MCH: 27.9 pg (ref 26.0–34.0)
MCHC: 31 g/dL (ref 30.0–36.0)
MCV: 90.2 fL (ref 78.0–100.0)
MONO ABS: 0.5 10*3/uL (ref 0.1–1.0)
MONOS PCT: 6 %
Myelocytes: 1 %
NEUTROS ABS: 3.6 10*3/uL (ref 1.7–7.7)
Neutrophils Relative %: 46 %
PLATELETS: 332 10*3/uL (ref 150–400)
RBC: 4.08 MIL/uL (ref 3.87–5.11)
RDW: 12.4 % (ref 11.5–15.5)
WBC: 7.7 10*3/uL (ref 4.0–10.5)

## 2015-07-24 LAB — URINALYSIS, ROUTINE W REFLEX MICROSCOPIC
BILIRUBIN URINE: NEGATIVE
Glucose, UA: NEGATIVE mg/dL
HGB URINE DIPSTICK: NEGATIVE
Ketones, ur: NEGATIVE mg/dL
Leukocytes, UA: NEGATIVE
NITRITE: NEGATIVE
PROTEIN: NEGATIVE mg/dL
SPECIFIC GRAVITY, URINE: 1.015 (ref 1.005–1.030)
pH: 6.5 (ref 5.0–8.0)

## 2015-07-24 LAB — LIPASE, BLOOD: LIPASE: 27 U/L (ref 11–51)

## 2015-07-24 MED ORDER — TRAMADOL HCL 50 MG PO TABS: 50.0000 mg | ORAL_TABLET | Freq: Four times a day (QID) | ORAL | Status: DC | PRN

## 2015-07-24 NOTE — ED Notes (Addendum)
Bowie Tran, PA-C in room with patient now. 

## 2015-07-24 NOTE — ED Notes (Signed)
Pt states last night she noticed her bilateral shoulders were painful to touch.  Today she woke up and feels that her chest, breasts, and abdomen are painful to touch as well.  Pt denies any chest pressure, SOB, nausea, vomiting, diarrhea, or urinary symptoms.  Pt states bilateral eyes feel "sore" today too.  Pt was treated by allergist on Monday with a shot and a 5 day regimen of prednisone which she finished today.

## 2015-07-24 NOTE — ED Notes (Signed)
Pt states increase in pain with palpation to mid sternal chest and RUQ and right flank.

## 2015-07-24 NOTE — ED Notes (Signed)
Pt transported to [place] by technician

## 2015-07-24 NOTE — ED Notes (Signed)
C/o pain to upper back, bilat shoulder,arms and abd pai-started yesterday-denies injury and activity when pain started-NAD-steady gait

## 2015-07-24 NOTE — ED Provider Notes (Signed)
CSN: [id number]     Arrival date & time 07/24/15  1555 History   First MD Initiated Contact with Patient 07/24/15 1635     Chief Complaint  Patient presents with  . Abdominal Pain     (Consider location/radiation/quality/duration/timing/severity/associated sxs/prior Treatment) HPI   50 year old female with history of hypertension, colonic polyps, arthritis and prior abdominal surgery including abdominal hysterectomy who presents for evaluation of abdominal pain. Patient reports yesterday she developed a dramatic bilateral shoulder pain which she described as an achy sensation, mild to moderate in severity and worsening with movement. Pain is waxing waning and noticeable only with movement. Today she reported having neck discomfort, mild headache, discomfort to her mid chest and her right upper quadrant abdomen. Her pain is more noticeable only with palpation but not present while resting. She denies having any fever, light or sound sensitivity, neck stiffness, chest pain, shortness of breath, lightheadedness, dizziness, diaphoresis, nausea vomiting diarrhea constipation, numbness, weakness or rash. No specific treatment tried. Denies any postprandial pain. She is not an alcohol abuser or having history of diabetes. No other complaint.   Past Medical History  Diagnosis Date  . Hypertension   . Allergy     seasonal  . Hx of adenomatous colonic polyps 04/25/2014  . Arthritis    Past Surgical History  Procedure Laterality Date  . Abdominal hysterectomy     Family History  Problem Relation Age of Onset  . Sudden death Neg Hx   . Hypertension Neg Hx   . Diabetes Neg Hx   . Heart attack Neg Hx   . Hyperlipidemia Neg Hx   . Colon cancer Neg Hx   . Esophageal cancer Neg Hx   . Rectal cancer Neg Hx   . Stomach cancer Neg Hx    Social History  Substance Use Topics  . Smoking status: Never Smoker   . Smokeless tobacco: Never Used  . Alcohol Use: No   OB History    No data available      Review of Systems  All other systems reviewed and are negative.     Allergies  Pollen extract  Home Medications   Prior to Admission medications   Medication Sig Start Date End Date Taking? Authorizing Provider  EPINEPHrine (EPIPEN 2-PAK) 0.3 mg/0.3 mL IJ SOAJ injection  01/01/14   Historical Provider, MD  fexofenadine (ALLEGRA) 180 MG tablet Take 180 mg by mouth daily.    Historical Provider, MD  lisinopril-hydrochlorothiazide (PRINZIDE,ZESTORETIC) 20-12.5 MG per tablet Take 1 tablet by mouth daily.    Historical Provider, MD   BP 148/99 mmHg  Pulse 71  Temp(Src) 97.6 F (36.4 C) (Oral)  Resp 18  Ht 5\' 6"  (1.676 m)  Wt 97.523 kg  BMI 34.72 kg/m2  SpO2 100% Physical Exam  Constitutional: She is oriented to person, place, and time. She appears well-developed and well-nourished. No distress.  African-American female laying in bed resting comfortably in no acute discomfort.  HENT:  Head: Atraumatic.  Mouth/Throat: Oropharynx is clear and moist.  Eyes: Conjunctivae are normal.  Neck: Normal range of motion. Neck supple.  No nuchal rigidity  Cardiovascular: Normal rate, regular rhythm and intact distal pulses.  Exam reveals no gallop and no friction rub.   No murmur heard. Pulmonary/Chest: Effort normal and breath sounds normal. No respiratory distress. She has no wheezes. She has no rales. She exhibits no tenderness.  Abdominal: Soft. Bowel sounds are normal. She exhibits no distension. There is tenderness (Mild right upper quadrant tenderness on  palpation without guarding or rebound tenderness. Negative Murphy sign, no pain at McBurney's point.).  No abdominal bruit, pulsatile mass, no guarding or rebound tenderness.  Musculoskeletal: She exhibits tenderness (Mild diffuse tenderness throughout bilateral shoulder and trapezius muscle on palpation without focal point tenderness. No significant midline spine tenderness crepitus or step-off.).  Neurological: She is alert and  oriented to person, place, and time.  Skin: No rash noted.  Psychiatric: She has a normal mood and affect.  Nursing note and vitals reviewed.   ED Course  Procedures (including critical care time) Labs Review Labs Reviewed  COMPREHENSIVE METABOLIC PANEL - Abnormal; Notable for the following:    Potassium 3.4 (*)    Chloride 99 (*)    Glucose, Bld 104 (*)    All other components within normal limits  CBC WITH DIFFERENTIAL/PLATELET - Abnormal; Notable for the following:    Hemoglobin 11.4 (*)    All other components within normal limits  URINALYSIS, ROUTINE W REFLEX MICROSCOPIC (NOT AT [facility])  LIPASE, BLOOD    Imaging Review US Abdomen Complete  07/24/2015  CLINICAL DATA:  Right upper quadrant abdominal pain and tenderness. EXAM: ABDOMEN ULTRASOUND COMPLETE COMPARISON:  Abdomen and pelvis CT dated 03/03/2015. FINDINGS: Gallbladder: Multiple gallstones filling the gallbladder, measuring up to 1.9 cm in diameter each. No gallbladder wall thickening or pericholecystic fluid. The patient was not focally tender over the gallbladder. Common bile duct: Diameter: 2.6 mm Liver: Diffusely echogenic. No significant low density on the previous CT. IVC: No abnormality visualized. Pancreas: Visualized portion unremarkable. Spleen: Size and appearance within normal limits. Right Kidney: Length: 10.9 cm. Echogenicity within normal limits. No mass or hydronephrosis visualized. Left Kidney: Length: 11.7 cm. 7 x 6 x 5 mm oval echogenic focus within the cortex in the lower pole. Corresponding fat density on the previous CT. 11 x 9 x 7 mm oval, hypoechoic area in the peripheral cortex of the upper pole with no corresponding abnormality on the previous CT. Abdominal aorta: No aneurysm visualized. The distal aorta and bifurcation were obscured by overlying bowel gas. Other findings: None. IMPRESSION: 1. Cholelithiasis without evidence of cholecystitis. 2. Diffusely echogenic liver, most likely due to mild steatosis.  3. 7 mm probable angiomyolipoma in the lower pole of the left kidney. 4. 11 mm hypoechoic mass in the upper pole of the left kidney with no corresponding CT abnormality. This could represent a complicated cyst or solid mass. Further evaluation with pre and postcontrast CT or MRI of the kidneys is recommended. Electronically Signed   By: Claudie Revering M.D.   On: 07/24/2015 18:22   I have personally reviewed and evaluated these images and lab results as part of my medical decision-making.   EKG Interpretation   Date/Time:  Friday July 24 2015 16:14:20 EDT Ventricular Rate:  65 PR Interval:  149 QRS Duration: 82 QT Interval:  389 QTC Calculation: 404 R Axis:   72 Text Interpretation:  Sinus rhythm Nonspecific T abnormalities, lateral  leads Confirmed by Hazle Coca [phone]) on 07/24/2015 4:54:01 PM      MDM   Final diagnoses:  Abdominal pain, right upper quadrant  Calculus of gallbladder without cholecystitis without obstruction  Cyst of left kidney    BP 109/61 mmHg  Pulse 70  Temp(Src) 97.6 F (36.4 C) (Oral)  Resp 16  Ht 5\' 6"  (1.676 m)  Wt 97.523 kg  BMI 34.72 kg/m2  SpO2 100%   5:12 PM Patient presents with right upper quadrant abdominal pain and atraumatic bilateral shoulder  pain. The pain is reproducible on exam but she does not have a surgical abdomen. She is well-appearing. Her headache is unlikely to be meningitis as patient has no nuchal rigidity and does not appear toxic. No chest pain, shortness of breath to suggest cardiopulmonary etiology. She has a partial hysterectomy, low suspicion for pregnancy causing her symptoms. She has no dysuria. Workup initiated, abdominal ultrasound ordered to rule out gallbladder etiology. Pain medication offer, patient decline. Patient is currently afebrile with stable normal vital signs. HEART score of 2, low risk of MACE  7:09 PM Labs are reassuring, normal lipase, UA without signs of urinary tract infection. Abdominal ultrasound  showing evidence of cholelithiasis but without evidence of cholecystitis. There are also evidence of a hypoechoic mass noted to the left kidney which could represent a complicated cyst versus a solid mass. It is recommended for patient to have further evaluation with pre-and postcontrast CT or MRI of the kidneys. Her pain is not affecting her left flank therefore I have low suspicion that this abnormal finding is relating to her current discomfort. I did discuss the finding and recommend outpatient follow-up with MRI or CT scan per PCP's preference.  I also recommend f/u with surgery as needed outpt for further management of cholelithiasis.    Domenic Moras, PA-C 07/24/15 [place], MD 07/25/15 [phone]

## 2015-07-24 NOTE — Discharge Instructions (Signed)
You have evidence of gallstones which may cause you abdominal pain. Please follow-up with [place] surgery as needed for further management. There are also a cyst that was noted in your left kidney. You will need to follow-up with your primary care Dr. for further evaluation of this which may require a CT scan or an MRI at your doctor's discretion. Return to ED if your condition worsen or if you have any other concern.  Cholelithiasis Cholelithiasis (also called gallstones) is a form of gallbladder disease. The gallbladder is a small organ that helps you digest fats. Symptoms of gallstones are:  Feeling sick to your stomach (nausea).  Throwing up (vomiting).  Belly pain.  Yellowing of the skin (jaundice).  Sudden pain. You may feel the pain for minutes to hours.  Fever.  Pain to the touch. HOME CARE  Only take medicines as told by your doctor.  Eat a low-fat diet until you see your doctor again. Eating fat can result in pain.  Follow up with your doctor as told. Attacks usually happen time after time. Surgery is usually needed for permanent treatment. GET HELP RIGHT AWAY IF:   Your pain gets worse.  Your pain is not helped by medicines.  You have a fever and lasting symptoms for more than 2-3 days.  You have a fever and your symptoms suddenly get worse.  You keep feeling sick to your stomach and throwing up. MAKE SURE YOU:   Understand these instructions.  Will watch your condition.  Will get help right away if you are not doing well or get worse.   This information is not intended to replace advice given to you by your health care provider. Make sure you discuss any questions you have with your health care provider.   Document Released: 10/12/2007 Document Revised: 12/26/2012 Document Reviewed: 10/17/2012 Elsevier Interactive Patient Education [employer].

## 2015-07-24 NOTE — ED Notes (Signed)
Domenic Moras PA-C in room with patient now.

## 2015-07-25 ENCOUNTER — Telehealth (HOSPITAL_BASED_OUTPATIENT_CLINIC_OR_DEPARTMENT_OTHER): Payer: Self-pay

## 2015-09-02 ENCOUNTER — Ambulatory Visit: Payer: Self-pay | Admitting: General Surgery

## 2015-09-02 NOTE — H&P (Signed)
History of Present Illness Lindsay Ok MD; 08/04/2015 2:39 PM) The patient is a 50 year old female who presents for evaluation of gall stones. The patient is a 50 year old female who is referred by [facility] for evaluation of symptomatic stones.  Patient was recently ER secondary to abdominal pain which radiated to the shoulders. Patient on ultrasound revealed gallstones. Patient's laboratory studies were within normal limits.  Patient states that the pain is all returned secondary to seen a low-fat diet.   Other Problems Elbert Ewings, CMA; 08/04/2015 1:52 PM) Arthritis Cholelithiasis High blood pressure  Past Surgical History Elbert Ewings, CMA; 08/04/2015 1:52 PM) Colon Polyp Removal - Colonoscopy Foot Surgery Left. Hysterectomy (not due to cancer) - Partial Knee Surgery Left.  Diagnostic Studies History Elbert Ewings, [place]; 08/04/2015 1:52 PM) Colonoscopy within last year Mammogram within last year Pap Smear 1-5 years ago  Allergies Elbert Ewings, CMA; 08/04/2015 1:53 PM) Pollen Extracts *ALTERNATIVE MEDICINES*  Medication History Elbert Ewings, CMA; 08/04/2015 1:53 PM) TraMADol HCl (50MG  Tablet, Oral) Active. Lisinopril-Hydrochlorothiazide (20-12.5MG  Tablet, Oral) Active. EPINEPHrine (0.3MG /0.3ML Soln Auto-inj, Injection) Active. Allegra (180MG  Tablet, Oral) Active. Medications Reconciled  Social History Elbert Ewings, [place]; 08/04/2015 1:52 PM) Caffeine use Carbonated beverages, Tea. No alcohol use No drug use Tobacco use Never smoker.  Family History Elbert Ewings, [place]; 08/04/2015 1:52 PM) Arthritis Family Members In General. Hypertension Family Members In General.  Pregnancy / Birth History Elbert Ewings, [place]; 08/04/2015 1:52 PM) Age at menarche 32 years. Age of menopause <43 Contraceptive History Oral contraceptives. Gravida 3 Maternal age 38-20 Para 3    Review of Systems Elbert Ewings CMA; 08/04/2015 1:52 PM) General Present- Fatigue.  Not Present- Appetite Loss, Chills, Fever, Night Sweats, Weight Gain and Weight Loss. Skin Not Present- Change in Wart/Mole, Dryness, Hives, Jaundice, New Lesions, Non-Healing Wounds, Rash and Ulcer. HEENT Present- Seasonal Allergies and Wears glasses/contact lenses. Not Present- Earache, Hearing Loss, Hoarseness, Nose Bleed, Oral Ulcers, Ringing in the Ears, Sinus Pain, Sore Throat, Visual Disturbances and Yellow Eyes. Respiratory Not Present- Bloody sputum, Chronic Cough, Difficulty Breathing, Snoring and Wheezing. Cardiovascular Not Present- Chest Pain, Difficulty Breathing Lying Down, Leg Cramps, Palpitations, Rapid Heart Rate, Shortness of Breath and Swelling of Extremities. Gastrointestinal Present- Gets full quickly at meals and Nausea. Not Present- Abdominal Pain, Bloating, Bloody Stool, Change in Bowel Habits, Chronic diarrhea, Constipation, Difficulty Swallowing, Excessive gas, Hemorrhoids, Indigestion, Rectal Pain and Vomiting. Female Genitourinary Not Present- Frequency, Nocturia, Painful Urination, Pelvic Pain and Urgency. Musculoskeletal Not Present- Back Pain, Joint Pain, Joint Stiffness, Muscle Pain, Muscle Weakness and Swelling of Extremities. Neurological Not Present- Decreased Memory, Fainting, Headaches, Numbness, Seizures, Tingling, Tremor, Trouble walking and Weakness. Psychiatric Present- Anxiety and Depression. Not Present- Bipolar, Change in Sleep Pattern, Fearful and Frequent crying. Endocrine Present- Hot flashes. Not Present- Cold Intolerance, Excessive Hunger, Hair Changes, Heat Intolerance and New Diabetes. Hematology Not Present- Easy Bruising, Excessive bleeding, Gland problems, HIV and Persistent Infections.  Vitals Elbert Ewings CMA; 08/04/2015 1:54 PM) 08/04/2015 1:54 PM Weight: 212.8 lb Height: 66in Body Surface Area: 2.05 m Body Mass Index: 34.35 kg/m  Temp.: 97.49F  Pulse: 60 (Regular)  BP: 138/84 (Sitting, Left Arm, Standard)       Physical  Exam Lindsay Ok, MD; 08/04/2015 2:40 PM) General Mental Status-Alert. General Appearance-Consistent with stated age. Hydration-Well hydrated. Voice-Normal.  Head and Neck Head-normocephalic, atraumatic with no lesions or palpable masses.  Eye Eyeball - Bilateral-Extraocular movements intact. Sclera/Conjunctiva - Bilateral-No scleral icterus.  Chest and Lung Exam Chest and lung  exam reveals -quiet, even and easy respiratory effort with no use of accessory muscles. Inspection Chest Wall - Normal. Back - normal.  Cardiovascular Cardiovascular examination reveals -normal heart sounds, regular rate and rhythm with no murmurs.  Abdomen Inspection Normal Exam - No Hernias. Palpation/Percussion Normal exam - Soft, Non Tender, No Rebound tenderness, No Rigidity (guarding) and No hepatosplenomegaly. Auscultation Normal exam - Bowel sounds normal.  Neurologic Neurologic evaluation reveals -alert and oriented x 3 with no impairment of recent or remote memory. Mental Status-Normal.  Musculoskeletal Normal Exam - Left-Upper Extremity Strength Normal and Lower Extremity Strength Normal. Normal Exam - Right-Upper Extremity Strength Normal, Lower Extremity Weakness.    Assessment & Plan Lindsay Ok MD; 08/04/2015 2:40 PM) SYMPTOMATIC CHOLELITHIASIS (K80.20) Impression: 50 year old female symptomatic stones.  1. Discussed with the patient in detail of the procedure, laparoscopic cholecystectomy 2. Risks and benefits were discussed with the patient to generally include, but not limited to: infection, bleeding, possible need for post op ERCP, damage to the bile ducts, bile leak, and possible need for further surgery. Alternatives were offered and described. All questions were answered and the patient voiced understanding of the procedure and wishes to proceed at this point with a laparoscopic cholecystectomy 3. The patient will call us back when she is  ready to proceed with surgery.

## 2015-09-06 NOTE — Anesthesia Preprocedure Evaluation (Addendum)
Anesthesia Evaluation  Patient identified by MRN, date of birth, ID band Patient awake    Reviewed: Allergy & Precautions, NPO status , Patient's Chart, lab work & pertinent test results  Airway Mallampati: II       Dental  (+) Dental Advisory Given, Teeth Intact   Pulmonary neg pulmonary ROS,    breath sounds clear to auscultation       Cardiovascular hypertension, Pt. on medications negative cardio ROS   Rhythm:Regular     Neuro/Psych negative neurological ROS  negative psych ROS   GI/Hepatic negative GI ROS, Neg liver ROS,   Endo/Other  negative endocrine ROSObesity BMI 35  Renal/GU negative Renal ROS  negative genitourinary   Musculoskeletal negative musculoskeletal ROS (+)   Abdominal   Peds negative pediatric ROS (+)  Hematology negative hematology ROS (+)   Anesthesia Other Findings   Reproductive/Obstetrics negative OB ROS                            Anesthesia Physical Anesthesia Plan  ASA: II  Anesthesia Plan: General   Post-op Pain Management:    Induction: Intravenous  Airway Management Planned: Oral ETT  Additional Equipment:   Intra-op Plan:   Post-operative Plan:   Informed Consent: I have reviewed the patients History and Physical, chart, labs and discussed the procedure including the risks, benefits and alternatives for the proposed anesthesia with the patient or authorized representative who has indicated his/her understanding and acceptance.     Plan Discussed with:   Anesthesia Plan Comments:         Anesthesia Quick Evaluation

## 2015-09-08 ENCOUNTER — Encounter (HOSPITAL_COMMUNITY): Payer: Self-pay

## 2015-09-08 ENCOUNTER — Encounter (HOSPITAL_COMMUNITY)
Admission: RE | Admit: 2015-09-08 | Discharge: 2015-09-08 | Disposition: A | Discharge status: Home / Self Care | Payer: Managed Care, Other (non HMO) | Source: Ambulatory Visit | Attending: General Surgery | Admitting: General Surgery

## 2015-09-08 DIAGNOSIS — K8020 Calculus of gallbladder without cholecystitis without obstruction: Secondary | ICD-10-CM | POA: Diagnosis present

## 2015-09-08 DIAGNOSIS — E669 Obesity, unspecified: Secondary | ICD-10-CM | POA: Diagnosis not present

## 2015-09-08 DIAGNOSIS — K8010 Calculus of gallbladder with chronic cholecystitis without obstruction: Secondary | ICD-10-CM | POA: Diagnosis not present

## 2015-09-08 DIAGNOSIS — I10 Essential (primary) hypertension: Secondary | ICD-10-CM | POA: Diagnosis not present

## 2015-09-08 DIAGNOSIS — M1990 Unspecified osteoarthritis, unspecified site: Secondary | ICD-10-CM | POA: Diagnosis not present

## 2015-09-08 DIAGNOSIS — Z6835 Body mass index (BMI) 35.0-35.9, adult: Secondary | ICD-10-CM | POA: Diagnosis not present

## 2015-09-08 DIAGNOSIS — Z79899 Other long term (current) drug therapy: Secondary | ICD-10-CM | POA: Diagnosis not present

## 2015-09-08 HISTORY — DX: Depression, unspecified: F32.A

## 2015-09-08 HISTORY — DX: Major depressive disorder, single episode, unspecified: F32.9

## 2015-09-08 LAB — CBC
HCT: 38.8 % (ref 36.0–46.0)
HEMOGLOBIN: 12.4 g/dL (ref 12.0–15.0)
MCH: 28.1 pg (ref 26.0–34.0)
MCHC: 32 g/dL (ref 30.0–36.0)
MCV: 88 fL (ref 78.0–100.0)
PLATELETS: 338 10*3/uL (ref 150–400)
RBC: 4.41 MIL/uL (ref 3.87–5.11)
RDW: 12.6 % (ref 11.5–15.5)
WBC: 4.2 10*3/uL (ref 4.0–10.5)

## 2015-09-08 LAB — BASIC METABOLIC PANEL
ANION GAP: 9 (ref 5–15)
BUN: 17 mg/dL (ref 6–20)
CALCIUM: 10 mg/dL (ref 8.9–10.3)
CO2: 29 mmol/L (ref 22–32)
Chloride: 104 mmol/L (ref 101–111)
Creatinine, Ser: 0.78 mg/dL (ref 0.44–1.00)
GLUCOSE: 92 mg/dL (ref 65–99)
Potassium: 4.2 mmol/L (ref 3.5–5.1)
Sodium: 142 mmol/L (ref 135–145)

## 2015-09-08 NOTE — Pre-Procedure Instructions (Signed)
EKG in EPIC 

## 2015-09-08 NOTE — Patient Instructions (Signed)
CATHRIN ERMEL  09/08/2015   Your procedure is scheduled on: 09/09/15  Report to [facility] Main  Entrance take [facility]  elevators to 3rd floor to  [place] at 9:30  AM.  Call this number if you have problems the morning of surgery [phone]   Remember: ONLY 1 PERSON MAY GO WITH YOU TO SHORT STAY TO GET  READY MORNING OF [place].  Do not eat food or drink liquids :After Midnight.tonight  Take Allegra if needed with sip of water                                You may not have any metal on your body including hair pins and              piercings  Do not wear jewelry, make-up, lotions, powders or perfumes, deodorant             Do not wear nail polish.  Do not shave  48 hours prior to surgery.                 Do not bring valuables to the hospital. [place].  Contacts, dentures or bridgework may not be worn into surgery.  Leave suitcase in the car. After surgery it may be brought to your room.     Patients discharged the day of surgery will not be allowed to drive home.  Name and phone number of your driver:mother- Rudene Re  Special Instructions: N/A             _____________________________________________________________________             [facility] - Preparing for Surgery Before surgery, you can play an important role.  Because skin is not sterile, your skin needs to be as free of germs as possible.  You can reduce the number of germs on your skin by washing with CHG (chlorahexidine gluconate) soap before surgery.  CHG is an antiseptic cleaner which kills germs and bonds with the skin to continue killing germs even after washing. Please DO NOT use if you have an allergy to CHG or antibacterial soaps.  If your skin becomes reddened/irritated stop using the CHG and inform your nurse when you arrive at Short Stay. Do not shave (including legs and underarms) for at least 48 hours prior to the  first CHG shower.  You may shave your face/neck. Please follow these instructions carefully:  1.  Shower with CHG Soap the night before surgery and the  morning of Surgery.  2.  If you choose to wash your hair, wash your hair first as usual with your  normal  shampoo.  3.  After you shampoo, rinse your hair and body thoroughly to remove the  shampoo.                           4.  Use CHG as you would any other liquid soap.  You can apply chg directly  to the skin and wash                       Gently with a scrungie or clean washcloth.  5.  Apply the CHG Soap to your body ONLY FROM THE NECK DOWN.   Do not use on face/ open                           Wound or open sores. Avoid contact with eyes, ears mouth and genitals (private parts).                       Wash face,  Genitals (private parts) with your normal soap.             6.  Wash thoroughly, paying special attention to the area where your surgery  will be performed.  7.  Thoroughly rinse your body with warm water from the neck down.  8.  DO NOT shower/wash with your normal soap after using and rinsing off  the CHG Soap.                9.  Pat yourself dry with a clean towel.            10.  Wear clean pajamas.            11.  Place clean sheets on your bed the night of your first shower and do not  sleep with pets. Day of Surgery : Do not apply any lotions/deodorants the morning of surgery.  Please wear clean clothes to the hospital/surgery center.  FAILURE TO FOLLOW THESE INSTRUCTIONS MAY RESULT IN THE CANCELLATION OF YOUR SURGERY PATIENT SIGNATURE_________________________________  NURSE SIGNATURE__________________________________  ________________________________________________________________________

## 2015-09-09 ENCOUNTER — Ambulatory Visit (HOSPITAL_COMMUNITY)
Admission: RE | Admit: 2015-09-09 | Discharge: 2015-09-09 | Disposition: A | Discharge status: Home / Self Care | Payer: Managed Care, Other (non HMO) | Source: Ambulatory Visit | Attending: General Surgery | Admitting: General Surgery

## 2015-09-09 ENCOUNTER — Encounter (HOSPITAL_COMMUNITY): Payer: Self-pay

## 2015-09-09 ENCOUNTER — Ambulatory Visit (HOSPITAL_COMMUNITY): Payer: Managed Care, Other (non HMO) | Admitting: Anesthesiology

## 2015-09-09 ENCOUNTER — Encounter (HOSPITAL_COMMUNITY)
Admission: RE | Disposition: A | Discharge status: Home / Self Care | Payer: Self-pay | Source: Ambulatory Visit | Attending: General Surgery

## 2015-09-09 DIAGNOSIS — Z6835 Body mass index (BMI) 35.0-35.9, adult: Secondary | ICD-10-CM | POA: Insufficient documentation

## 2015-09-09 DIAGNOSIS — E669 Obesity, unspecified: Secondary | ICD-10-CM | POA: Insufficient documentation

## 2015-09-09 DIAGNOSIS — I10 Essential (primary) hypertension: Secondary | ICD-10-CM | POA: Insufficient documentation

## 2015-09-09 DIAGNOSIS — K8010 Calculus of gallbladder with chronic cholecystitis without obstruction: Secondary | ICD-10-CM | POA: Insufficient documentation

## 2015-09-09 DIAGNOSIS — Z79899 Other long term (current) drug therapy: Secondary | ICD-10-CM | POA: Insufficient documentation

## 2015-09-09 DIAGNOSIS — M1990 Unspecified osteoarthritis, unspecified site: Secondary | ICD-10-CM | POA: Insufficient documentation

## 2015-09-09 HISTORY — PX: CHOLECYSTECTOMY: SHX55

## 2015-09-09 SURGERY — LAPAROSCOPIC CHOLECYSTECTOMY
Anesthesia: General

## 2015-09-09 MED ORDER — ONDANSETRON HCL 4 MG/2ML IJ SOLN: 4.0000 mg | Freq: Once | INTRAMUSCULAR | Status: DC

## 2015-09-09 MED ORDER — ACETAMINOPHEN 650 MG RE SUPP
650.0000 mg | RECTAL | Status: DC | PRN
  Filled 2015-09-09: qty 1

## 2015-09-09 MED ORDER — LACTATED RINGERS IV SOLN
INTRAVENOUS | Status: DC | PRN
  Administered 2015-09-09 (×2): via INTRAVENOUS

## 2015-09-09 MED ORDER — OXYCODONE-ACETAMINOPHEN 5-325 MG PO TABS: 1.0000 | ORAL_TABLET | Freq: Once | ORAL | Status: DC

## 2015-09-09 MED ORDER — OXYCODONE-ACETAMINOPHEN 5-325 MG PO TABS: 1.0000 | ORAL_TABLET | ORAL | Status: DC | PRN

## 2015-09-09 MED ORDER — PROPOFOL 10 MG/ML IV BOLUS
INTRAVENOUS | Status: AC
  Filled 2015-09-09: qty 20

## 2015-09-09 MED ORDER — SUGAMMADEX SODIUM 200 MG/2ML IV SOLN
INTRAVENOUS | Status: AC
  Filled 2015-09-09: qty 2

## 2015-09-09 MED ORDER — SODIUM CHLORIDE 0.9 % IV SOLN: 250.0000 mL | INTRAVENOUS | Status: DC | PRN

## 2015-09-09 MED ORDER — CHLORHEXIDINE GLUCONATE 4 % EX LIQD: 1.0000 "application " | Freq: Once | CUTANEOUS | Status: DC

## 2015-09-09 MED ORDER — MIDAZOLAM HCL 2 MG/2ML IJ SOLN
INTRAMUSCULAR | Status: AC
  Filled 2015-09-09: qty 2

## 2015-09-09 MED ORDER — SUCCINYLCHOLINE CHLORIDE 20 MG/ML IJ SOLN
INTRAMUSCULAR | Status: DC | PRN
  Administered 2015-09-09: 100 mg via INTRAVENOUS

## 2015-09-09 MED ORDER — CEFAZOLIN SODIUM-DEXTROSE 2-4 GM/100ML-% IV SOLN
INTRAVENOUS | Status: AC
  Filled 2015-09-09: qty 100

## 2015-09-09 MED ORDER — LABETALOL HCL 5 MG/ML IV SOLN
INTRAVENOUS | Status: AC
  Filled 2015-09-09: qty 4

## 2015-09-09 MED ORDER — SODIUM CHLORIDE 0.9% FLUSH: 3.0000 mL | Freq: Two times a day (BID) | INTRAVENOUS | Status: DC

## 2015-09-09 MED ORDER — LIDOCAINE HCL (CARDIAC) 20 MG/ML IV SOLN
INTRAVENOUS | Status: DC | PRN
  Administered 2015-09-09: 100 mg via INTRAVENOUS

## 2015-09-09 MED ORDER — ACETAMINOPHEN 325 MG PO TABS: 650.0000 mg | ORAL_TABLET | ORAL | Status: DC | PRN

## 2015-09-09 MED ORDER — ROCURONIUM BROMIDE 100 MG/10ML IV SOLN
INTRAVENOUS | Status: DC | PRN
  Administered 2015-09-09: 30 mg via INTRAVENOUS

## 2015-09-09 MED ORDER — IOPAMIDOL (ISOVUE-300) INJECTION 61%
INTRAVENOUS | Status: AC
  Filled 2015-09-09: qty 50

## 2015-09-09 MED ORDER — SUGAMMADEX SODIUM 200 MG/2ML IV SOLN
INTRAVENOUS | Status: DC | PRN
Start: 2015-09-09 — End: 2015-09-09
  Administered 2015-09-09: 200 mg via INTRAVENOUS

## 2015-09-09 MED ORDER — MIDAZOLAM HCL 5 MG/5ML IJ SOLN
INTRAMUSCULAR | Status: DC | PRN
  Administered 2015-09-09: 2 mg via INTRAVENOUS

## 2015-09-09 MED ORDER — FENTANYL CITRATE (PF) 100 MCG/2ML IJ SOLN: 25.0000 ug | INTRAMUSCULAR | Status: DC | PRN

## 2015-09-09 MED ORDER — FENTANYL CITRATE (PF) 100 MCG/2ML IJ SOLN
INTRAMUSCULAR | Status: DC | PRN
  Administered 2015-09-09: 50 ug via INTRAVENOUS
  Administered 2015-09-09 (×2): 100 ug via INTRAVENOUS

## 2015-09-09 MED ORDER — BUPIVACAINE-EPINEPHRINE (PF) 0.25% -1:200000 IJ SOLN
INTRAMUSCULAR | Status: DC | PRN
  Administered 2015-09-09: 10 mL via PERINEURAL

## 2015-09-09 MED ORDER — LIDOCAINE HCL (CARDIAC) 20 MG/ML IV SOLN
INTRAVENOUS | Status: AC
  Filled 2015-09-09: qty 5

## 2015-09-09 MED ORDER — MEPERIDINE HCL 50 MG/ML IJ SOLN: 6.2500 mg | INTRAMUSCULAR | Status: DC | PRN

## 2015-09-09 MED ORDER — FENTANYL CITRATE (PF) 250 MCG/5ML IJ SOLN
INTRAMUSCULAR | Status: AC
  Filled 2015-09-09: qty 5

## 2015-09-09 MED ORDER — LABETALOL HCL 5 MG/ML IV SOLN
INTRAVENOUS | Status: DC | PRN
  Administered 2015-09-09: 2.5 mg via INTRAVENOUS

## 2015-09-09 MED ORDER — MORPHINE SULFATE (PF) 10 MG/ML IV SOLN: 2.0000 mg | INTRAVENOUS | Status: DC | PRN

## 2015-09-09 MED ORDER — ONDANSETRON HCL 4 MG/2ML IJ SOLN
INTRAMUSCULAR | Status: DC | PRN
  Administered 2015-09-09: 4 mg via INTRAVENOUS

## 2015-09-09 MED ORDER — LACTATED RINGERS IV SOLN
INTRAVENOUS | Status: DC
  Administered 2015-09-09: 11:00:00 via INTRAVENOUS

## 2015-09-09 MED ORDER — ONDANSETRON HCL 4 MG/2ML IJ SOLN
INTRAMUSCULAR | Status: AC
  Filled 2015-09-09: qty 2

## 2015-09-09 MED ORDER — BUPIVACAINE-EPINEPHRINE (PF) 0.25% -1:200000 IJ SOLN
INTRAMUSCULAR | Status: AC
  Filled 2015-09-09: qty 30

## 2015-09-09 MED ORDER — LACTATED RINGERS IV SOLN
INTRAVENOUS | Status: DC | PRN
  Administered 2015-09-09: 1000 mL via INTRAVENOUS

## 2015-09-09 MED ORDER — CEFAZOLIN SODIUM-DEXTROSE 2-4 GM/100ML-% IV SOLN
2.0000 g | INTRAVENOUS | Status: AC
  Administered 2015-09-09: 2 g via INTRAVENOUS
  Filled 2015-09-09: qty 100

## 2015-09-09 MED ORDER — PROPOFOL 10 MG/ML IV BOLUS
INTRAVENOUS | Status: DC | PRN
  Administered 2015-09-09: 250 mg via INTRAVENOUS

## 2015-09-09 MED ORDER — SODIUM CHLORIDE 0.9% FLUSH: 3.0000 mL | INTRAVENOUS | Status: DC | PRN

## 2015-09-09 MED ORDER — OXYCODONE HCL 5 MG PO TABS
5.0000 mg | ORAL_TABLET | ORAL | Status: DC | PRN
  Administered 2015-09-09: 5 mg via ORAL
  Filled 2015-09-09: qty 1

## 2015-09-09 SURGICAL SUPPLY — 46 items
APL SKNCLS STERI-STRIP NONHPOA (GAUZE/BANDAGES/DRESSINGS) ×1
APPLIER CLIP 5 13 M/L LIGAMAX5 (MISCELLANEOUS)
APR CLP MED LRG 5 ANG JAW (MISCELLANEOUS)
BAG SPEC RTRVL 10 TROC 200 (ENDOMECHANICALS) ×1
BENZOIN TINCTURE PRP APPL 2/3 (GAUZE/BANDAGES/DRESSINGS) ×2 IMPLANT
CABLE HIGH FREQUENCY MONO STRZ (ELECTRODE) ×3 IMPLANT
CHLORAPREP W/TINT 26ML (MISCELLANEOUS) ×3 IMPLANT
CLIP APPLIE 5 13 M/L LIGAMAX5 (MISCELLANEOUS) IMPLANT
CLIP LIGATING HEMO O LOK GREEN (MISCELLANEOUS) ×7 IMPLANT
CLOSURE STERI-STRIP 1/4X4 (GAUZE/BANDAGES/DRESSINGS) ×2 IMPLANT
CLOSURE WOUND 1/2 X4 (GAUZE/BANDAGES/DRESSINGS) ×1
COVER MAYO STAND STRL (DRAPES) ×2 IMPLANT
COVER SURGICAL LIGHT HANDLE (MISCELLANEOUS) ×3 IMPLANT
COVER TRANSDUCER ULTRASND (DRAPES) ×3 IMPLANT
DECANTER SPIKE VIAL GLASS SM (MISCELLANEOUS) ×3 IMPLANT
DEVICE TROCAR PUNCTURE CLOSURE (ENDOMECHANICALS) ×3 IMPLANT
DRAPE C-ARM 42X120 X-RAY (DRAPES) IMPLANT
DRAPE LAPAROSCOPIC ABDOMINAL (DRAPES) ×3 IMPLANT
DRAPE UTILITY XL STRL (DRAPES) ×3 IMPLANT
ELECT REM PT RETURN 9FT ADLT (ELECTROSURGICAL) ×3
ELECTRODE REM PT RTRN 9FT ADLT (ELECTROSURGICAL) ×1 IMPLANT
GAUZE SPONGE 2X2 8PLY STRL LF (GAUZE/BANDAGES/DRESSINGS) ×1 IMPLANT
GAUZE SPONGE 4X4 12PLY STRL (GAUZE/BANDAGES/DRESSINGS) ×3 IMPLANT
GLOVE BIO SURGEON STRL SZ7.5 (GLOVE) ×3 IMPLANT
GOWN STRL REUS W/TWL XL LVL3 (GOWN DISPOSABLE) ×6 IMPLANT
HEMOSTAT SURGICEL 4X8 (HEMOSTASIS) IMPLANT
KIT BASIN OR (CUSTOM PROCEDURE TRAY) ×3 IMPLANT
NDL INSUFFLATION 14GA 120MM (NEEDLE) ×1 IMPLANT
NEEDLE INSUFFLATION 14GA 120MM (NEEDLE) ×3 IMPLANT
PAD POSITIONING PINK XL (MISCELLANEOUS) IMPLANT
POSITIONER SURGICAL ARM (MISCELLANEOUS) IMPLANT
POUCH RETRIEVAL ECOSAC 10 (ENDOMECHANICALS) IMPLANT
POUCH RETRIEVAL ECOSAC 10MM (ENDOMECHANICALS) ×2
SCISSORS LAP 5X35 DISP (ENDOMECHANICALS) ×3 IMPLANT
SET CHOLANGIOGRAPH MIX (MISCELLANEOUS) IMPLANT
SET IRRIG TUBING LAPAROSCOPIC (IRRIGATION / IRRIGATOR) ×3 IMPLANT
SPONGE GAUZE 2X2 STER 10/PKG (GAUZE/BANDAGES/DRESSINGS) ×2
STRIP CLOSURE SKIN 1/2X4 (GAUZE/BANDAGES/DRESSINGS) ×2 IMPLANT
SUT MNCRL AB 4-0 PS2 18 (SUTURE) ×3 IMPLANT
TAPE CLOTH 4X10 WHT NS (GAUZE/BANDAGES/DRESSINGS) IMPLANT
TOWEL OR 17X26 10 PK STRL BLUE (TOWEL DISPOSABLE) ×3 IMPLANT
TOWEL OR NON WOVEN STRL DISP B (DISPOSABLE) ×3 IMPLANT
TRAY LAPAROSCOPIC (CUSTOM PROCEDURE TRAY) ×3 IMPLANT
TROCAR BLADELESS OPT 5 75 (ENDOMECHANICALS) ×3 IMPLANT
TROCAR SLEEVE XCEL 5X75 (ENDOMECHANICALS) ×3 IMPLANT
TROCAR XCEL NON-BLD 11X100MML (ENDOMECHANICALS) ×3 IMPLANT

## 2015-09-09 NOTE — Op Note (Signed)
09/09/2015  12:36 PM  PATIENT:  Lindsay [name]  50 y.o. female  PRE-OPERATIVE DIAGNOSIS:  Gallstones  POST-OPERATIVE DIAGNOSIS:  Gallstones  PROCEDURE:  Procedure(s): LAPAROSCOPIC CHOLECYSTECTOMY (N/A)  SURGEON:  Surgeon(s) and Role:    * Ralene Ok, MD - Primary  ANESTHESIA:   local and general  EBL:  Total I/O In: 1000 [I.V.:1000] Out: 10 [Blood:10]  BLOOD ADMINISTERED:none  DRAINS: none   LOCAL MEDICATIONS USED:  BUPIVICAINE   SPECIMEN:  Source of Specimen:  gallbladder  DISPOSITION OF SPECIMEN:  PATHOLOGY  COUNTS:  YES  TOURNIQUET:  * No tourniquets in log *  DICTATION: .Dragon Dictation The patient was taken to the operating and placed in the supine position with bilateral SCDs in place. The patient was prepped and draped in the usual sterile fashion. A time out was called and all facts were verified. A pneumoperitoneum was obtained via A Veress needle technique to a pressure of 96mm of mercury.  A 51mm trochar was then placed in the right upper quadrant under visualization, and there were no injuries to any abdominal organs. A 11 mm port was then placed in the umbilical region after infiltrating with local anesthesia under direct visualization. A second and third epigastric port and right lower quadrant port placement under direct visualization, respectively. The liver was very fatty and large. The gallbladder was identified and retracted, the peritoneum was then sharply dissected from the gallbladder and this dissection was carried down to Calot's triangle. The gallbladder was identified and stripped away circumferentially and seen going into the gallbladder 360, the critical angle was obtained.  2 clips were placed proximally one distally and the cystic duct transected. The cystic artery was identified and 2 clips placed proximally and one distally and transected. We then proceeded to remove the gallbladder off the hepatic fossa with Bovie cautery. A retrieval bag  was then placed in the abdomen and gallbladder placed in the bag. The hepatic fossa was then reexamined and hemostasis was achieved with Bovie cautery and was excellent at the end of the case. The subhepatic fossa and perihepatic fossa was then irrigated until the effluent was clear.  The gallbladder and bag were removed from the abdominal cavity. The 11 mm trocar fascia was reapproximated with the Endo Close #1 Vicryl x2. The pneumoperitoneum was evacuated and all trochars removed under direct visulalization. The skin was then closed with 4-0 Monocryl and the skin dressed with Steri-Strips, gauze, and tape. The patient was awaken from general anesthesia and taken to the recovery room in stable condition.   PLAN OF CARE: Discharge to home after PACU  PATIENT DISPOSITION:  PACU - hemodynamically stable.   Delay start of Pharmacological VTE agent (>24hrs) due to surgical blood loss or risk of bleeding: not applicable

## 2015-09-09 NOTE — Interval H&P Note (Signed)
History and Physical Interval Note:  09/09/2015 10:03 AM  Lindsay [name]  has presented today for surgery, with the diagnosis of Gallstones  The various methods of treatment have been discussed with the patient and family. After consideration of risks, benefits and other options for treatment, the patient has consented to  Procedure(s): LAPAROSCOPIC CHOLECYSTECTOMY (N/A) as a surgical intervention .  The patient's history has been reviewed, patient examined, no change in status, stable for surgery.  I have reviewed the patient's chart and labs.  Questions were answered to the patient's satisfaction.     Rosario Jacks., Anne Hahn

## 2015-09-09 NOTE — Progress Notes (Signed)
Patient ambulated from 1303 to 1309 after gall bladder surgery. Tolerated well. Able to void in bathroom. Pain is improved with oxycodone.

## 2015-09-09 NOTE — Discharge Instructions (Signed)
[facility] ______[facility] °LAPAROSCOPIC SURGERY: POST OP INSTRUCTIONS °Always review your discharge instruction sheet given to you by the facility where your surgery was performed. °IF YOU HAVE DISABILITY OR FAMILY LEAVE FORMS, YOU MUST BRING THEM TO THE OFFICE FOR PROCESSING.   °DO NOT GIVE THEM TO YOUR DOCTOR. ° °1. A prescription for pain medication may be given to you upon discharge.  Take your pain medication as prescribed, if needed.  If narcotic pain medicine is not needed, then you may take acetaminophen (Tylenol) or ibuprofen (Advil) as needed. °2. Take your usually prescribed medications unless otherwise directed. °3. If you need a refill on your pain medication, please contact your pharmacy.  They will contact our office to request authorization. Prescriptions will not be filled after 5pm or on week-ends. °4. You should follow a light diet the first few days after arrival home, such as soup and crackers, etc.  Be sure to include lots of fluids daily. °5. Most patients will experience some swelling and bruising in the area of the incisions.  Ice packs will help.  Swelling and bruising can take several days to resolve.  °6. It is common to experience some constipation if taking pain medication after surgery.  Increasing fluid intake and taking a stool softener (such as Colace) will usually help or prevent this problem from occurring.  A mild laxative (Milk of Magnesia or Miralax) should be taken according to package instructions if there are no bowel movements after 48 hours. °7. Unless discharge instructions indicate otherwise, you may remove your bandages 24-48 hours after surgery, and you may shower at that time.  You may have steri-strips (small skin tapes) in place directly over the incision.  These strips should be left on the skin for 7-10 days.  If your surgeon used skin glue on the incision, you may shower in 24 hours.  The glue will flake off over the next 2-3 weeks.  Any sutures or  staples will be removed at the office during your follow-up visit. °8. ACTIVITIES:  You may resume regular (light) daily activities beginning the next day--such as daily self-care, walking, climbing stairs--gradually increasing activities as tolerated.  You may have sexual intercourse when it is comfortable.  Refrain from any heavy lifting or straining until approved by your doctor. °a. You may drive when you are no longer taking prescription pain medication, you can comfortably wear a seatbelt, and you can safely maneuver your car and apply brakes. °b. RETURN TO WORK:  __________________________________________________________ °9. You should see your doctor in the office for a follow-up appointment approximately 2-3 weeks after your surgery.  Make sure that you call for this appointment within a day or two after you arrive home to insure a convenient appointment time. °10. OTHER INSTRUCTIONS: __________________________________________________________________________________________________________________________ __________________________________________________________________________________________________________________________ °WHEN TO CALL YOUR DOCTOR: °1. Fever over 101.0 °2. Inability to urinate °3. Continued bleeding from incision. °4. Increased pain, redness, or drainage from the incision. °5. Increasing abdominal pain ° °The clinic staff is available to answer your questions during regular business hours.  Please don’t hesitate to call and ask to speak to one of the nurses for clinical concerns.  If you have a medical emergency, go to the nearest emergency room or call 911.  A surgeon from [facility] is always on call at the hospital. °[address], [address], [place], [place]  [address] ? [address] [address], [place],    [address] °[phone] ? [phone] ? FAX [phone] °Web site:   [email or web address]        General Anesthesia, Adult, Care After Refer  to this sheet in the next few weeks. These instructions provide you with information on caring for yourself after your procedure. Your health care provider may also give you more specific instructions. Your treatment has been planned according to current medical practices, but problems sometimes occur. Call your health care provider if you have any problems or questions after your procedure. WHAT TO EXPECT AFTER THE PROCEDURE After the procedure, it is typical to experience: Sleepiness. Nausea and vomiting. HOME CARE INSTRUCTIONS For the first 24 hours after general anesthesia: Have a responsible person with you. Do not drive a car. If you are alone, do not take public transportation. Do not drink alcohol. Do not take medicine that has not been prescribed by your health care provider. Do not sign important papers or make important decisions. You may resume a normal diet and activities as directed by your health care provider. Change bandages (dressings) as directed. If you have questions or problems that seem related to general anesthesia, call the hospital and ask for the anesthetist or anesthesiologist on call. SEEK MEDICAL CARE IF: You have nausea and vomiting that continue the day after anesthesia. You develop a rash. SEEK IMMEDIATE MEDICAL CARE IF:  You have difficulty breathing. You have chest pain. You have any allergic problems.   This information is not intended to replace advice given to you by your health care provider. Make sure you discuss any questions you have with your health care provider.   Document Released: 08/01/2000 Document Revised: 05/16/2014 Document Reviewed: 08/24/2011 Elsevier Interactive Patient Education [employer].

## 2015-09-09 NOTE — Anesthesia Procedure Notes (Signed)
Procedure Name: Intubation Date/Time: 09/09/2015 11:47 AM Performed by: Gean Maidens Pre-anesthesia Checklist: Patient identified, Timeout performed, Emergency Drugs available, Suction available and Patient being monitored Patient Re-evaluated:Patient Re-evaluated prior to inductionOxygen Delivery Method: Circle system utilized Preoxygenation: Pre-oxygenation with 100% oxygen Intubation Type: IV induction Ventilation: Mask ventilation without difficulty Laryngoscope Size: Mac and 3 Grade View: Grade II Tube type: Oral Tube size: 7.0 mm Number of attempts: 1 Airway Equipment and Method: Stylet Placement Confirmation: ETT inserted through vocal cords under direct vision,  positive ETCO2 and breath sounds checked- equal and bilateral Secured at: 21 cm Dental Injury: Teeth and Oropharynx as per pre-operative assessment

## 2015-09-09 NOTE — H&P (View-Only) (Signed)
History of Present Illness Ralene Ok MD; 08/04/2015 2:39 PM) The patient is a 50 year old female who presents for evaluation of gall stones. The patient is a 50 year old female who is referred by [facility] for evaluation of symptomatic stones.  Patient was recently ER secondary to abdominal pain which radiated to the shoulders. Patient on ultrasound revealed gallstones. Patient's laboratory studies were within normal limits.  Patient states that the pain is all returned secondary to seen a low-fat diet.   Other Problems Elbert Ewings, CMA; 08/04/2015 1:52 PM) Arthritis Cholelithiasis High blood pressure  Past Surgical History Elbert Ewings, CMA; 08/04/2015 1:52 PM) Colon Polyp Removal - Colonoscopy Foot Surgery Left. Hysterectomy (not due to cancer) - Partial Knee Surgery Left.  Diagnostic Studies History Elbert Ewings, [place]; 08/04/2015 1:52 PM) Colonoscopy within last year Mammogram within last year Pap Smear 1-5 years ago  Allergies Elbert Ewings, CMA; 08/04/2015 1:53 PM) Pollen Extracts *ALTERNATIVE MEDICINES*  Medication History Elbert Ewings, CMA; 08/04/2015 1:53 PM) TraMADol HCl (50MG  Tablet, Oral) Active. Lisinopril-Hydrochlorothiazide (20-12.5MG  Tablet, Oral) Active. EPINEPHrine (0.3MG /0.3ML Soln Auto-inj, Injection) Active. Allegra (180MG  Tablet, Oral) Active. Medications Reconciled  Social History Elbert Ewings, [place]; 08/04/2015 1:52 PM) Caffeine use Carbonated beverages, Tea. No alcohol use No drug use Tobacco use Never smoker.  Family History Elbert Ewings, [place]; 08/04/2015 1:52 PM) Arthritis Family Members In General. Hypertension Family Members In General.  Pregnancy / Birth History Elbert Ewings, [place]; 08/04/2015 1:52 PM) Age at menarche 57 years. Age of menopause <43 Contraceptive History Oral contraceptives. Gravida 3 Maternal age 60-20 Para 3    Review of Systems Elbert Ewings CMA; 08/04/2015 1:52 PM) General Present- Fatigue.  Not Present- Appetite Loss, Chills, Fever, Night Sweats, Weight Gain and Weight Loss. Skin Not Present- Change in Wart/Mole, Dryness, Hives, Jaundice, New Lesions, Non-Healing Wounds, Rash and Ulcer. HEENT Present- Seasonal Allergies and Wears glasses/contact lenses. Not Present- Earache, Hearing Loss, Hoarseness, Nose Bleed, Oral Ulcers, Ringing in the Ears, Sinus Pain, Sore Throat, Visual Disturbances and Yellow Eyes. Respiratory Not Present- Bloody sputum, Chronic Cough, Difficulty Breathing, Snoring and Wheezing. Cardiovascular Not Present- Chest Pain, Difficulty Breathing Lying Down, Leg Cramps, Palpitations, Rapid Heart Rate, Shortness of Breath and Swelling of Extremities. Gastrointestinal Present- Gets full quickly at meals and Nausea. Not Present- Abdominal Pain, Bloating, Bloody Stool, Change in Bowel Habits, Chronic diarrhea, Constipation, Difficulty Swallowing, Excessive gas, Hemorrhoids, Indigestion, Rectal Pain and Vomiting. Female Genitourinary Not Present- Frequency, Nocturia, Painful Urination, Pelvic Pain and Urgency. Musculoskeletal Not Present- Back Pain, Joint Pain, Joint Stiffness, Muscle Pain, Muscle Weakness and Swelling of Extremities. Neurological Not Present- Decreased Memory, Fainting, Headaches, Numbness, Seizures, Tingling, Tremor, Trouble walking and Weakness. Psychiatric Present- Anxiety and Depression. Not Present- Bipolar, Change in Sleep Pattern, Fearful and Frequent crying. Endocrine Present- Hot flashes. Not Present- Cold Intolerance, Excessive Hunger, Hair Changes, Heat Intolerance and New Diabetes. Hematology Not Present- Easy Bruising, Excessive bleeding, Gland problems, HIV and Persistent Infections.  Vitals Elbert Ewings CMA; 08/04/2015 1:54 PM) 08/04/2015 1:54 PM Weight: 212.8 lb Height: 66in Body Surface Area: 2.05 m Body Mass Index: 34.35 kg/m  Temp.: 97.34F  Pulse: 60 (Regular)  BP: 138/84 (Sitting, Left Arm, Standard)       Physical  Exam Ralene Ok, MD; 08/04/2015 2:40 PM) General Mental Status-Alert. General Appearance-Consistent with stated age. Hydration-Well hydrated. Voice-Normal.  Head and Neck Head-normocephalic, atraumatic with no lesions or palpable masses.  Eye Eyeball - Bilateral-Extraocular movements intact. Sclera/Conjunctiva - Bilateral-No scleral icterus.  Chest and Lung Exam Chest and lung  exam reveals -quiet, even and easy respiratory effort with no use of accessory muscles. Inspection Chest Wall - Normal. Back - normal.  Cardiovascular Cardiovascular examination reveals -normal heart sounds, regular rate and rhythm with no murmurs.  Abdomen Inspection Normal Exam - No Hernias. Palpation/Percussion Normal exam - Soft, Non Tender, No Rebound tenderness, No Rigidity (guarding) and No hepatosplenomegaly. Auscultation Normal exam - Bowel sounds normal.  Neurologic Neurologic evaluation reveals -alert and oriented x 3 with no impairment of recent or remote memory. Mental Status-Normal.  Musculoskeletal Normal Exam - Left-Upper Extremity Strength Normal and Lower Extremity Strength Normal. Normal Exam - Right-Upper Extremity Strength Normal, Lower Extremity Weakness.    Assessment & Plan Ralene Ok MD; 08/04/2015 2:40 PM) SYMPTOMATIC CHOLELITHIASIS (K80.20) Impression: 50 year old female symptomatic stones.  1. Discussed with the patient in detail of the procedure, laparoscopic cholecystectomy 2. Risks and benefits were discussed with the patient to generally include, but not limited to: infection, bleeding, possible need for post op ERCP, damage to the bile ducts, bile leak, and possible need for further surgery. Alternatives were offered and described. All questions were answered and the patient voiced understanding of the procedure and wishes to proceed at this point with a laparoscopic cholecystectomy 3. The patient will call us back when she is  ready to proceed with surgery.

## 2015-09-09 NOTE — Anesthesia Postprocedure Evaluation (Signed)
Anesthesia Post Note  Patient: Lindsay [name]  Procedure(s) Performed: Procedure(s) (LRB): LAPAROSCOPIC CHOLECYSTECTOMY (N/A)  Patient location during evaluation: PACU Anesthesia Type: General Level of consciousness: awake and alert Pain management: pain level controlled Vital Signs Assessment: post-procedure vital signs reviewed and stable Respiratory status: spontaneous breathing, nonlabored ventilation, respiratory function stable and patient connected to nasal cannula oxygen Cardiovascular status: blood pressure returned to baseline and stable Postop Assessment: no signs of nausea or vomiting Anesthetic complications: no    Last Vitals:  Filed Vitals:   09/09/15 1330 09/09/15 1345  BP: 134/81 122/79  Pulse: 64 68  Temp:    Resp: 14 16    Last Pain:  Filed Vitals:   09/09/15 1347  PainSc: Asleep                 Alexis Frock

## 2015-09-09 NOTE — Transfer of Care (Signed)
Immediate Anesthesia Transfer of Care Note  Patient: Lindsay [name]  Procedure(s) Performed: Procedure(s): LAPAROSCOPIC CHOLECYSTECTOMY (N/A)  Patient Location: PACU  Anesthesia Type:General  Level of Consciousness: sedated, patient cooperative and responds to stimulation  Airway & Oxygen Therapy: Patient Spontanous Breathing and Patient connected to face mask oxygen  Post-op Assessment: Report given to RN and Post -op Vital signs reviewed and stable  Post vital signs: Reviewed and stable  Last Vitals:  Filed Vitals:   09/09/15 0920 09/09/15 1251  BP: 156/84   Pulse: 74 77  Temp: 36.7 C   Resp: 16     Last Pain: There were no vitals filed for this visit.    Patients Stated Pain Goal: 4 ([id number])  Complications: No apparent anesthesia complications

## 2015-09-10 ENCOUNTER — Encounter (HOSPITAL_COMMUNITY): Payer: Self-pay | Admitting: General Surgery

## 2015-10-05 ENCOUNTER — Emergency Department (HOSPITAL_BASED_OUTPATIENT_CLINIC_OR_DEPARTMENT_OTHER)
Admission: EM | Admit: 2015-10-05 | Discharge: 2015-10-05 | Disposition: A | Discharge status: Home / Self Care | Payer: Managed Care, Other (non HMO) | Attending: Emergency Medicine | Admitting: Emergency Medicine

## 2015-10-05 ENCOUNTER — Encounter (HOSPITAL_BASED_OUTPATIENT_CLINIC_OR_DEPARTMENT_OTHER): Payer: Self-pay

## 2015-10-05 ENCOUNTER — Emergency Department (HOSPITAL_BASED_OUTPATIENT_CLINIC_OR_DEPARTMENT_OTHER): Payer: Managed Care, Other (non HMO)

## 2015-10-05 DIAGNOSIS — M1711 Unilateral primary osteoarthritis, right knee: Secondary | ICD-10-CM | POA: Insufficient documentation

## 2015-10-05 DIAGNOSIS — Z79899 Other long term (current) drug therapy: Secondary | ICD-10-CM | POA: Insufficient documentation

## 2015-10-05 DIAGNOSIS — I10 Essential (primary) hypertension: Secondary | ICD-10-CM | POA: Insufficient documentation

## 2015-10-05 DIAGNOSIS — M25561 Pain in right knee: Secondary | ICD-10-CM | POA: Diagnosis present

## 2015-10-05 DIAGNOSIS — M25551 Pain in right hip: Secondary | ICD-10-CM | POA: Diagnosis not present

## 2015-10-05 DIAGNOSIS — F329 Major depressive disorder, single episode, unspecified: Secondary | ICD-10-CM | POA: Diagnosis not present

## 2015-10-05 MED ORDER — MELOXICAM 15 MG PO TABS: 15.0000 mg | ORAL_TABLET | Freq: Every day | ORAL | Status: DC | PRN

## 2015-10-05 NOTE — ED Notes (Signed)
C/o right knee, right buttock x 1 week-denies injury-NAD-steady gait

## 2015-10-05 NOTE — Discharge Instructions (Signed)
Arthritis °Arthritis is a term that is commonly used to refer to joint pain or joint disease. There are more than 100 types of arthritis. °CAUSES °The most common cause of this condition is wear and tear of a joint. Other causes include: °· Gout. °· Inflammation of a joint. °· An infection of a joint. °· Sprains and other injuries near the joint. °· A drug reaction or allergic reaction. °In some cases, the cause may not be known. °SYMPTOMS °The main symptom of this condition is pain in the joint with movement. Other symptoms include: °· Redness, swelling, or stiffness at a joint. °· Warmth coming from the joint. °· Fever. °· Overall feeling of illness. °DIAGNOSIS °This condition may be diagnosed with a physical exam and tests, including: °· Blood tests. °· Urine tests. °· Imaging tests, such as MRI, X-rays, or a CT scan. °Sometimes, fluid is removed from a joint for testing. °TREATMENT °Treatment for this condition may involve: °· Treatment of the cause, if it is known. °· Rest. °· Raising (elevating) the joint. °· Applying cold or hot packs to the joint. °· Medicines to improve symptoms and reduce inflammation. °· Injections of a steroid such as cortisone into the joint to help reduce pain and inflammation. °Depending on the cause of your arthritis, you may need to make lifestyle changes to reduce stress on your joint. These changes may include exercising more and losing weight. °HOME CARE INSTRUCTIONS °Medicines °· Take over-the-counter and prescription medicines only as told by your health care provider. °· Do not take aspirin to relieve pain if gout is suspected. °Activities °· Rest your joint if told by your health care provider. Rest is important when your disease is active and your joint feels painful, swollen, or stiff. °· Avoid activities that make the pain worse. It is important to balance activity with rest. °· Exercise your joint regularly with range-of-motion exercises as told by your health care  provider. Try doing low-impact exercise, such as: °¨ Swimming. °¨ Water aerobics. °¨ Biking. °¨ Walking. °Joint Care °· If your joint is swollen, keep it elevated if told by your health care provider. °· If your joint feels stiff in the morning, try taking a warm shower. °· If directed, apply heat to the joint. If you have diabetes, do not apply heat without permission from your health care provider. °· Put a towel between the joint and the hot pack or heating pad. °· Leave the heat on the area for 20-30 minutes. °· If directed, apply ice to the joint: °· Put ice in a plastic bag. °· Place a towel between your skin and the bag. °· Leave the ice on for 20 minutes, 2-3 times per day. °· Keep all follow-up visits as told by your health care provider. This is important. °SEEK MEDICAL CARE IF: °· The pain gets worse. °· You have a fever. °SEEK IMMEDIATE MEDICAL CARE IF: °· You develop severe joint pain, swelling, or redness. °· Many joints become painful and swollen. °· You develop severe back pain. °· You develop severe weakness in your leg. °· You cannot control your bladder or bowels. °  °This information is not intended to replace advice given to you by your health care provider. Make sure you discuss any questions you have with your health care provider. °  °Document Released: 06/02/2004 Document Revised: 01/14/2015 Document Reviewed: 07/21/2014 °Elsevier Interactive Patient Education ©2016 [employer] ° °Osteoarthritis °Osteoarthritis is a disease that causes soreness and inflammation of a joint. It   occurs when the cartilage at the affected joint wears down. Cartilage acts as a cushion, covering the ends of bones where they meet to form a joint. Osteoarthritis is the most common form of arthritis. It often occurs in older people. The joints affected most often by this condition include those in the: °· Ends of the fingers. °· Thumbs. °· Neck. °· Lower back. °· Knees. °· Hips. °CAUSES  °Over time, the cartilage  that covers the ends of bones begins to wear away. This causes bone to rub on bone, producing pain and stiffness in the affected joints.  °RISK FACTORS °Certain factors can increase your chances of having osteoarthritis, including: °· Older age. °· Excessive body weight. °· Overuse of joints. °· Previous joint injury. °SIGNS AND SYMPTOMS  °· Pain, swelling, and stiffness in the joint. °· Over time, the joint may lose its normal shape. °· Small deposits of bone (osteophytes) may grow on the edges of the joint. °· Bits of bone or cartilage can break off and float inside the joint space. This may cause more pain and damage. °DIAGNOSIS  °Your health care provider will do a physical exam and ask about your symptoms. Various tests may be ordered, such as: °· X-rays of the affected joint. °· Blood tests to rule out other types of arthritis. °Additional tests may be used to diagnose your condition. °TREATMENT  °Goals of treatment are to control pain and improve joint function. Treatment plans may include: °· A prescribed exercise program that allows for rest and joint relief. °· A weight control plan. °· Pain relief techniques, such as: °¨ Properly applied heat and cold. °¨ Electric pulses delivered to nerve endings under the skin (transcutaneous electrical nerve stimulation [TENS]). °¨ Massage. °¨ Certain nutritional supplements. °· Medicines to control pain, such as: °¨ Acetaminophen. °¨ Nonsteroidal anti-inflammatory drugs (NSAIDs), such as naproxen. °¨ Narcotic or central-acting agents, such as tramadol. °¨ Corticosteroids. These can be given orally or as an injection. °· Surgery to reposition the bones and relieve pain (osteotomy) or to remove loose pieces of bone and cartilage. Joint replacement may be needed in advanced states of osteoarthritis. °HOME CARE INSTRUCTIONS  °· Take medicines only as directed by your health care provider. °· Maintain a healthy weight. Follow your health care provider's instructions for  weight control. This may include dietary instructions. °· Exercise as directed. Your health care provider can recommend specific types of exercise. These may include: °¨ Strengthening exercises. These are done to strengthen the muscles that support joints affected by arthritis. They can be performed with weights or with exercise bands to add resistance. °¨ Aerobic activities. These are exercises, such as brisk walking or low-impact aerobics, that get your heart pumping. °¨ Range-of-motion activities. These keep your joints limber. °¨ Balance and agility exercises. These help you maintain daily living skills. °· Rest your affected joints as directed by your health care provider. °· Keep all follow-up visits as directed by your health care provider. °SEEK MEDICAL CARE IF:  °· Your skin turns red. °· You develop a rash in addition to your joint pain. °· You have worsening joint pain. °· You have a fever along with joint or muscle aches. °SEEK IMMEDIATE MEDICAL CARE IF: °· You have a significant loss of weight or appetite. °· You have night sweats. °FOR MORE INFORMATION  °· National Institute of Arthritis and Musculoskeletal and Skin Diseases: www.niams.nih.gov °· National Institute on Aging: www.nia.nih.gov °· American College of Rheumatology: www.rheumatology.org °  °This information is   not intended to replace advice given to you by your health care provider. Make sure you discuss any questions you have with your health care provider. °  °Document Released: 04/25/2005 Document Revised: 05/16/2014 Document Reviewed: 12/31/2012 °Elsevier Interactive Patient Education ©2016 Else[employer] Inc. ° °

## 2015-10-05 NOTE — ED Provider Notes (Signed)
CSN: [id number]     Arrival date & time 10/05/15  1524 History  By signing my name below, I, Hansel Feinstein, attest that this documentation has been prepared under the direction and in the presence of Dorie Rank, MD. Electronically Signed: Hansel Feinstein, ED Scribe. 10/05/2015. 3:58 PM.    Chief Complaint  Patient presents with  . Knee Pain   The history is provided by the patient. No language interpreter was used.   HPI Comments: Lindsay [name] is a 50 y.o. female who presents to the Emergency Department complaining of moderate right knee pain and swelling onset a week ago with associated right hip pain. Pt denies recent falls, injury or trauma. Pt states that pain is worsened with movement, ambulation and weight-bearing. She is ambulatory without difficulty, but reports her knee occasionally "locks up". Pt reports h/o knee pains and left meniscus repair. She denies fever, numbness, weakness.    Past Medical History  Diagnosis Date  . Hypertension   . Allergy     seasonal  . Hx of adenomatous colonic polyps 04/25/2014  . Arthritis   . Depression     mild depression after death of daughter- no meds  . Anemia     prior to hysterectomy   Past Surgical History  Procedure Laterality Date  . Abdominal hysterectomy    . Knee arthroscopy  2016  . Cholecystectomy N/A 09/09/2015    Procedure: LAPAROSCOPIC CHOLECYSTECTOMY;  Surgeon: Ralene Ok, MD;  Location: [facility]S;  Service: General;  Laterality: N/A;   Family History  Problem Relation Age of Onset  . Sudden death Neg Hx   . Hypertension Neg Hx   . Diabetes Neg Hx   . Heart attack Neg Hx   . Hyperlipidemia Neg Hx   . Colon cancer Neg Hx   . Esophageal cancer Neg Hx   . Rectal cancer Neg Hx   . Stomach cancer Neg Hx    Social History  Substance Use Topics  . Smoking status: Never Smoker   . Smokeless tobacco: Never Used  . Alcohol Use: No   OB History    No data available     Review of Systems  Constitutional: Negative for  fever.  Musculoskeletal: Positive for joint swelling (right knee) and arthralgias (right knee, right hip).  Neurological: Negative for weakness and numbness.  All other systems reviewed and are negative.  Allergies  Tramadol; Vicodin; and Pollen extract  Home Medications   Prior to Admission medications   Medication Sig Start Date End Date Taking? Authorizing Provider  EPINEPHrine (EPIPEN 2-PAK) 0.3 mg/0.3 mL IJ SOAJ injection Inject 0.3 mg into the muscle once as needed (For anaphylaxis.).  01/01/14   Historical Provider, MD  fexofenadine (ALLEGRA) 180 MG tablet Take 180 mg by mouth daily.    Historical Provider, MD  lisinopril-hydrochlorothiazide (PRINZIDE,ZESTORETIC) 20-12.5 MG per tablet Take 1 tablet by mouth daily.    Historical Provider, MD  meloxicam (MOBIC) 15 MG tablet Take 1 tablet (15 mg total) by mouth daily as needed for pain. 10/05/15   Dorie Rank, MD   BP 145/76 mmHg  Pulse 96  Temp(Src) 98.5 F (36.9 C) (Oral)  Resp 18  Ht 5\' 7"  (1.702 m)  Wt 96.616 kg  BMI 33.35 kg/m2  SpO2 99% Physical Exam  Constitutional: She appears well-developed and well-nourished. No distress.  HENT:  Head: Normocephalic and atraumatic.  Right Ear: External ear normal.  Left Ear: External ear normal.  Eyes: Conjunctivae are normal. Right eye exhibits  no discharge. Left eye exhibits no discharge. No scleral icterus.  Neck: Neck supple. No tracheal deviation present.  Cardiovascular: Normal rate.   Pulmonary/Chest: Effort normal. No stridor. No respiratory distress.  Musculoskeletal: She exhibits no edema.       Right hip: She exhibits tenderness. She exhibits normal range of motion, no swelling, no crepitus and no deformity.       Right knee: She exhibits swelling and effusion. She exhibits normal range of motion, no erythema and normal alignment. Tenderness found.  No ACL laxity.   Neurological: She is alert. Cranial nerve deficit: no gross deficits.  Skin: Skin is warm and dry. No rash  noted.  Psychiatric: She has a normal mood and affect.  Nursing note and vitals reviewed.   ED Course  Procedures (including critical care time) DIAGNOSTIC STUDIES: Oxygen Saturation is 99% on RA, normal by my interpretation.    COORDINATION OF CARE: 3:55 PM Discussed treatment plan with pt at bedside which includes XR and pt agreed to plan.   Labs Review Labs Reviewed - No data to display  Imaging Review Dg Knee Complete 4 Views Right  10/05/2015  CLINICAL DATA:  Medial knee pain for 2 weeks without trauma. EXAM: RIGHT KNEE - COMPLETE 4+ VIEW COMPARISON:  None. FINDINGS: Mild joint space narrowing and osteophyte formation involve the medial and lateral compartments. Moderate joint space narrowing and subchondral sclerosis involves the patellofemoral compartment. No acute fracture or dislocation. No joint effusion. IMPRESSION: 3 compartment osteoarthritis.  No acute osseous abnormality. Electronically Signed   By: Abigail Miyamoto M.D.   On: 10/05/2015 16:24   Dg Hip Unilat With Pelvis 2-3 Views Right  10/05/2015  CLINICAL DATA:  50 year old female with history of right buttock pain for the past 2 weeks. EXAM: DG HIP (WITH OR WITHOUT PELVIS) 2-3V RIGHT COMPARISON:  No priors. FINDINGS: There is no evidence of hip fracture or dislocation. There is no evidence of arthropathy or other focal bone abnormality. IMPRESSION: Negative. Electronically Signed   By: Vinnie Langton M.D.   On: 10/05/2015 16:25   I have personally reviewed and evaluated these images and lab results as part of my medical decision-making.    MDM   Final diagnoses:  Osteoarthritis of right knee, unspecified osteoarthritis type   No signs of infection.  Joint effusion on exam related to her osteoarthritis.  Will dc home with nsaids.  Follow up with orthopedist.  I personally performed the services described in this documentation, which was scribed in my presence.  The recorded information has been reviewed and is  accurate.   Dorie Rank, MD 10/05/15 1710

## 2015-10-05 NOTE — ED Notes (Signed)
Patient transported to X-ray 

## 2015-11-25 ENCOUNTER — Encounter (HOSPITAL_BASED_OUTPATIENT_CLINIC_OR_DEPARTMENT_OTHER): Payer: Self-pay

## 2015-11-25 ENCOUNTER — Emergency Department (HOSPITAL_BASED_OUTPATIENT_CLINIC_OR_DEPARTMENT_OTHER)
Admission: EM | Admit: 2015-11-25 | Discharge: 2015-11-25 | Disposition: A | Discharge status: Home / Self Care | Payer: Managed Care, Other (non HMO) | Attending: Emergency Medicine | Admitting: Emergency Medicine

## 2015-11-25 ENCOUNTER — Emergency Department (HOSPITAL_BASED_OUTPATIENT_CLINIC_OR_DEPARTMENT_OTHER): Payer: Managed Care, Other (non HMO)

## 2015-11-25 DIAGNOSIS — Z79899 Other long term (current) drug therapy: Secondary | ICD-10-CM | POA: Insufficient documentation

## 2015-11-25 DIAGNOSIS — M25561 Pain in right knee: Secondary | ICD-10-CM | POA: Insufficient documentation

## 2015-11-25 DIAGNOSIS — I10 Essential (primary) hypertension: Secondary | ICD-10-CM | POA: Diagnosis not present

## 2015-11-25 MED ORDER — IBUPROFEN 400 MG PO TABS: 400.0000 mg | ORAL_TABLET | Freq: Four times a day (QID) | ORAL | Status: DC | PRN

## 2015-11-25 MED FILL — IBUPROFEN 400 MG TABLET: 400 | 7 days supply | Qty: 30 | Fill #0

## 2015-11-25 NOTE — ED Provider Notes (Signed)
CSN: [id number]     Arrival date & time 11/25/15  1546 History  By signing my name below, I, Rayna Sexton, attest that this documentation has been prepared under the direction and in the presence of Montine Circle, PA-C.  Electronically Signed: Rayna Sexton, ED Scribe. 11/25/2015. 4:11 PM.   Chief Complaint  Patient presents with  . Knee Pain   The history is provided by the patient. No language interpreter was used.    HPI Comments: Lindsay [name] is a 50 y.o. female with a PMHx of arthritis who presents to the Emergency Department complaining of constant, moderate, atraumatic right knee pain x 1 week. Pt denies any recent injuries noting she has a hx of arthritis in the affected knee and has noticed a "popping" sound from the joint. Her pain worsens with ambulation. She has taken Tylenol Arthritis w/o significant relief. 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 Pt states she has worn a compression sleeve over her knee in the past but discontinued use stating it caused worsening joint swelling. She denies any other associated symptoms at this time.   Past Medical History  Diagnosis Date  . Hypertension   . Allergy     seasonal  . Hx of adenomatous colonic polyps 04/25/2014  . Arthritis   . Depression     mild depression after death of daughter- no meds  . Anemia     prior to hysterectomy   Past Surgical History  Procedure Laterality Date  . Abdominal hysterectomy    . Knee arthroscopy  2016  . Cholecystectomy N/A 09/09/2015    Procedure: LAPAROSCOPIC CHOLECYSTECTOMY;  Surgeon: Ralene Ok, MD;  Location: [facility];  Service: General;  Laterality: N/A;   Family History  Problem Relation Age of Onset  . Sudden death Neg Hx   . Hypertension Neg Hx   . Diabetes Neg Hx   . Heart attack Neg Hx   . Hyperlipidemia Neg Hx   . Colon cancer Neg Hx   . Esophageal cancer Neg Hx   . Rectal cancer Neg Hx   .  Stomach cancer Neg Hx    Social History  Substance Use Topics  . Smoking status: Never Smoker   . Smokeless tobacco: Never Used  . Alcohol Use: No   OB History    No data available     Review of Systems  Musculoskeletal: Positive for arthralgias.  Skin: Negative for color change and wound.    Allergies  Tramadol; Vicodin; and Pollen extract  Home Medications   Prior to Admission medications   Medication Sig Start Date End Date Taking? Authorizing Provider  EPINEPHrine (EPIPEN 2-PAK) 0.3 mg/0.3 mL IJ SOAJ injection Inject 0.3 mg into the muscle once as needed (For anaphylaxis.).  01/01/14   Historical Provider, MD  fexofenadine (ALLEGRA) 180 MG tablet Take 180 mg by mouth daily.    Historical Provider, MD  lisinopril-hydrochlorothiazide (PRINZIDE,ZESTORETIC) 20-12.5 MG per tablet Take 1 tablet by mouth daily.    Historical Provider, MD  meloxicam (MOBIC) 15 MG tablet Take 1 tablet (15 mg total) by mouth daily as needed for pain. 10/05/15   Dorie Rank, MD   BP 120/73 mmHg  Pulse 85  Temp(Src) 97.7 F (36.5 C) (Oral)  Resp 18  Ht 5\' 6"  (1.676 m)  Wt 220 lb (99.791 kg)  BMI 35.53 kg/m2  SpO2 100%    Physical Exam  Constitutional: Pt appears well-developed  and well-nourished. No distress.  HENT:  Head: Normocephalic and atraumatic.  Eyes: Conjunctivae are normal.  Neck: Normal range of motion.  Cardiovascular: Normal rate, regular rhythm and intact distal pulses.   Capillary refill < 3 sec  Pulmonary/Chest: Effort normal and breath sounds normal.  Musculoskeletal: Pt exhibits tenderness at the anterior right knee with no obvious effusion bony abnormality or deformity. Pt exhibits no edema.  ROM: 5/5  Neurological: Pt  is alert. Coordination normal.  Sensation 5/5 Strength 5/5  Skin: Skin is warm and dry. Pt is not diaphoretic.  No tenting of the skin  Psychiatric: Pt has a normal mood and affect.  Nursing note and vitals reviewed.  ED Course  Procedures   DIAGNOSTIC STUDIES: Oxygen Saturation is 100% on RA, normal by my interpretation.    COORDINATION OF CARE: 4:10 PM Discussed next steps with pt. Pt verbalized understanding and is agreeable with the plan.    Imaging Review Dg Knee Complete 4 Views Right  11/25/2015  CLINICAL DATA:  Right knee pain for the past week.  No known injury. EXAM: RIGHT KNEE - COMPLETE 4+ VIEW COMPARISON:  Right knee dated 10/05/2015. FINDINGS: Mild medial spur formation and mild to moderate lateral and patellofemoral spur formation. No definite effusion. IMPRESSION: Mild to moderate degenerative changes. Electronically Signed   By: Claudie Revering M.D.   On: 11/25/2015 16:30   I have personally reviewed and evaluated these images and lab results as part of my medical decision-making.    MDM   Final diagnoses:  Right knee pain    Patient with right knee pain x 1 month.  Pain anteriorly.  No effusion.  No obvious swelling.  X-ray shows degenerative changes and patellofemoral spur formation.  Recommend ortho/sports medicine and PT follow-up.  I personally performed the services described in this documentation, which was scribed in my presence. The recorded information has been reviewed and is accurate.      Montine Circle, PA-C 11/25/15 1748   Tanna Furry, MD 12/04/15 [phone]

## 2015-11-25 NOTE — ED Notes (Signed)
Co right knee pain x 1 week-denies injury-states hx of arthritis-NAD

## 2015-11-25 NOTE — ED Notes (Signed)
Patient transported to X-ray 

## 2015-11-25 NOTE — Discharge Instructions (Signed)

## 2015-12-14 ENCOUNTER — Other Ambulatory Visit: Payer: Self-pay | Admitting: Radiology

## 2015-12-16 ENCOUNTER — Encounter: Payer: Self-pay | Admitting: *Deleted

## 2015-12-16 ENCOUNTER — Telehealth: Payer: Self-pay | Admitting: *Deleted

## 2015-12-16 DIAGNOSIS — C50411 Malignant neoplasm of upper-outer quadrant of right female breast: Secondary | ICD-10-CM

## 2015-12-16 DIAGNOSIS — Z171 Estrogen receptor negative status [ER-]: Secondary | ICD-10-CM

## 2015-12-16 HISTORY — DX: Malignant neoplasm of upper-outer quadrant of right female breast: C50.411

## 2015-12-16 NOTE — Telephone Encounter (Signed)
Confirmed [facility] for 12/23/15 at 1215 .  Instructions and contact information given.

## 2015-12-23 ENCOUNTER — Ambulatory Visit
Admission: RE | Admit: 2015-12-23 | Discharge: 2015-12-23 | Disposition: A | Discharge status: Home / Self Care | Payer: Managed Care, Other (non HMO) | Source: Ambulatory Visit | Attending: Radiation Oncology | Admitting: Radiation Oncology

## 2015-12-23 ENCOUNTER — Other Ambulatory Visit (HOSPITAL_BASED_OUTPATIENT_CLINIC_OR_DEPARTMENT_OTHER): Payer: Managed Care, Other (non HMO)

## 2015-12-23 ENCOUNTER — Ambulatory Visit (HOSPITAL_BASED_OUTPATIENT_CLINIC_OR_DEPARTMENT_OTHER): Payer: Managed Care, Other (non HMO) | Admitting: Oncology

## 2015-12-23 ENCOUNTER — Encounter: Payer: Self-pay | Admitting: Oncology

## 2015-12-23 ENCOUNTER — Telehealth: Payer: Self-pay | Admitting: Oncology

## 2015-12-23 VITALS — BP 150/83 | HR 68 | Temp 97.4°F | Resp 18 | Ht 66.0 in | Wt 216.3 lb

## 2015-12-23 DIAGNOSIS — C50411 Malignant neoplasm of upper-outer quadrant of right female breast: Secondary | ICD-10-CM | POA: Diagnosis not present

## 2015-12-23 DIAGNOSIS — Z171 Estrogen receptor negative status [ER-]: Secondary | ICD-10-CM

## 2015-12-23 DIAGNOSIS — I10 Essential (primary) hypertension: Secondary | ICD-10-CM | POA: Diagnosis not present

## 2015-12-23 LAB — CBC WITH DIFFERENTIAL/PLATELET
BASO%: 0.9 % (ref 0.0–2.0)
Basophils Absolute: 0 10*3/uL (ref 0.0–0.1)
EOS ABS: 0.1 10*3/uL (ref 0.0–0.5)
EOS%: 1.9 % (ref 0.0–7.0)
HCT: 38.7 % (ref 34.8–46.6)
HEMOGLOBIN: 12.4 g/dL (ref 11.6–15.9)
LYMPH#: 1.7 10*3/uL (ref 0.9–3.3)
LYMPH%: 38.9 % (ref 14.0–49.7)
MCH: 27.6 pg (ref 25.1–34.0)
MCHC: 31.9 g/dL (ref 31.5–36.0)
MCV: 86.4 fL (ref 79.5–101.0)
MONO#: 0.3 10*3/uL (ref 0.1–0.9)
MONO%: 7.5 % (ref 0.0–14.0)
NEUT%: 50.8 % (ref 38.4–76.8)
NEUTROS ABS: 2.2 10*3/uL (ref 1.5–6.5)
PLATELETS: 321 10*3/uL (ref 145–400)
RBC: 4.48 10*6/uL (ref 3.70–5.45)
RDW: 13.2 % (ref 11.2–14.5)
WBC: 4.4 10*3/uL (ref 3.9–10.3)

## 2015-12-23 LAB — COMPREHENSIVE METABOLIC PANEL
ALBUMIN: 4.1 g/dL (ref 3.5–5.0)
ALK PHOS: 55 U/L (ref 40–150)
ALT: 26 U/L (ref 0–55)
ANION GAP: 9 meq/L (ref 3–11)
AST: 30 U/L (ref 5–34)
BILIRUBIN TOTAL: 0.71 mg/dL (ref 0.20–1.20)
BUN: 19.1 mg/dL (ref 7.0–26.0)
CO2: 28 mEq/L (ref 22–29)
CREATININE: 1 mg/dL (ref 0.6–1.1)
Calcium: 10.1 mg/dL (ref 8.4–10.4)
Chloride: 106 mEq/L (ref 98–109)
EGFR: 77 mL/min/{1.73_m2} — AB (ref >90)
GLUCOSE: 99 mg/dL (ref 70–140)
Potassium: 3.5 mEq/L (ref 3.5–5.1)
SODIUM: 143 meq/L (ref 136–145)
TOTAL PROTEIN: 8.1 g/dL (ref 6.4–8.3)

## 2015-12-23 NOTE — Telephone Encounter (Signed)
appt made and avs printed °

## 2015-12-23 NOTE — Progress Notes (Signed)
Radiation Oncology         [phone] ________________________________  Initial Outpatient Consultation  Name: Lindsay [name] MRN: [id number]  Date: 12/23/2015  DOB: [birth year]  QJ:JHERDE,YCXKG E, MD  Rolm Bookbinder, MD   REFERRING PHYSICIAN: Rolm Bookbinder, MD  DIAGNOSIS: The encounter diagnosis was Breast cancer of upper-outer quadrant of right female breast [facility]).   Clinical T1c, N0, Mx grade 2-3 invasive ductal carcinoma of the right breast (triple negative)  HISTORY OF PRESENT ILLNESS::Lindsay [name] is a 50 y.o. female who had a screening mammogram on 12/04/15 revealing a 1.5 cm mass in the right breast at the 11 o'clock posterior depth. Ultrasound of the right breast on 12/10/15 revealed a 1.9 cm mass in the UOQ posterior depth of the right breast correlating to the findings on mammography. A lymph node in the right axilla with focal cortical thickening was noted.  The patient underwent multiple biopsies on 12/14/15. Biopsy of a right breast mass 9 o'clock (12 cm from the nipple) revealed grade 2-3 invasive ductal carcinoma (ER negative, PR negative, HER2 negative, Ki67 70%). Biopsy of a right breast mass (10 cm from the nipple) revealed benign fibroadipose tissue. Biopsy of the right axillary lymph node was negative for malignancy.  PREVIOUS RADIATION THERAPY: No  PAST MEDICAL HISTORY:  has a past medical history of Allergy; Anemia; Arthritis; Breast cancer of upper-outer quadrant of right female breast ([place]) (12/16/2015); Depression; Hot flashes; adenomatous colonic polyps (04/25/2014); and Hypertension.    PAST SURGICAL HISTORY: Past Surgical History:  Procedure Laterality Date  . ABDOMINAL HYSTERECTOMY    . CHOLECYSTECTOMY N/A 09/09/2015   Procedure: LAPAROSCOPIC CHOLECYSTECTOMY;  Surgeon: Ralene Ok, MD;  Location: [facility];  Service: General;  Laterality: N/A;  . KNEE ARTHROSCOPY  2016    FAMILY HISTORY: family history includes Breast cancer in her cousin, maternal  grandfather, and other.  SOCIAL HISTORY:  reports that she has never smoked. She has never used smokeless tobacco. She reports that she does not drink alcohol or use drugs. Works at [employer] community  ALLERGIES: Tramadol; Vicodin [hydrocodone-acetaminophen]; and Pollen extract  MEDICATIONS:  Current Outpatient Prescriptions  Medication Sig Dispense Refill  . EPINEPHrine (EPIPEN 2-PAK) 0.3 mg/0.3 mL IJ SOAJ injection Inject 0.3 mg into the muscle once as needed (For anaphylaxis.).     Marland Kitchen fexofenadine (ALLEGRA) 180 MG tablet Take 180 mg by mouth daily.    Marland Kitchen ibuprofen (ADVIL,MOTRIN) 400 MG tablet Take 1 tablet (400 mg total) by mouth every 6 (six) hours as needed. 30 tablet 0  . lisinopril-hydrochlorothiazide (PRINZIDE,ZESTORETIC) 20-12.5 MG per tablet Take 1 tablet by mouth daily.    . meloxicam (MOBIC) 15 MG tablet Take 1 tablet (15 mg total) by mouth daily as needed for pain. 30 tablet 0   No current facility-administered medications for this encounter.     REVIEW OF SYSTEMS:  A 15 point review of systems is documented in the electronic medical record. This was obtained by the nursing staff. However, I reviewed this with the patient to discuss relevant findings and make appropriate changes.  The patient wears glasses and has hot flashes.  Gynecologic History  Age at first menstrual period? 10  Are you still having periods? No  If you no longer have periods: Have you used hormone replacement? No Obstetric History:  How many children have you carried to term? 3 Your age at first live birth? 65  Pregnant now or trying to get pregnant? No  Have you used birth control  pills or hormone shots for contraception? Yes  If so, for how long (or approximate dates)? Years ago Health Maintenance:  Have you ever had a colonoscopy? Yes  Have you ever had a bone density? No  PHYSICAL EXAM:  Vitals with BMI 12/23/2015  Height _0   Weight 216 lbs 5 oz  BMI 35  Systolic 801    Diastolic 83  Pulse 68  Respirations 18  Lungs are clear to auscultation bilaterally. Heart has regular rate and rhythm. No palpable cervical, supraclavicular, or axillary adenopathy. Examination of the left breast revealed to be large and pendulous without nipple discharge or bleeding. Examination of the right breast appears to be large and pendulous without nipple discharge or bleeding. Small biopsy site noted in the lateral aspect of the breast.  ECOG = 1  LABORATORY DATA:  Lab Results  Component Value Date   WBC 4.4 12/23/2015   HGB 12.4 12/23/2015   HCT 38.7 12/23/2015   MCV 86.4 12/23/2015   PLT 321 12/23/2015   NEUTROABS 2.2 12/23/2015   Lab Results  Component Value Date   NA 143 12/23/2015   K 3.5 12/23/2015   CL 104 09/08/2015   CO2 28 12/23/2015   GLUCOSE 99 12/23/2015   CREATININE 1.0 12/23/2015   CALCIUM 10.1 12/23/2015      RADIOGRAPHY: Dg Knee Complete 4 Views Right  Result Date: 11/25/2015 CLINICAL DATA:  Right knee pain for the past week.  No known injury. EXAM: RIGHT KNEE - COMPLETE 4+ VIEW COMPARISON:  Right knee dated 10/05/2015. FINDINGS: Mild medial spur formation and mild to moderate lateral and patellofemoral spur formation. No definite effusion. IMPRESSION: Mild to moderate degenerative changes. Electronically Signed   By: Claudie Revering M.D.   On: 11/25/2015 16:30      IMPRESSION: Clinical T1c, N0, Mx grade 2-3 invasive ductal carcinoma of the right breast (triple negative)  She would be a good candidate for neoadjuvant chemotherapy followed by lumpectomy and sentinel node procedure.  I spoke to the patient today regarding her diagnosis and options for treatment. We discussed the equivalence in terms of survival and local failure between mastectomy and breast conservation. We discussed the role of radiation in decreasing local failures in patients who undergo lumpectomy. We discussed the process of simulation and the placement tattoos. We discussed 4-6  weeks of treatment as an outpatient. We discussed the possibility of asymptomatic lung damage. We discussed the low likelihood of secondary malignancies. We discussed the possible side effects including but not limited to skin redness, fatigue, permanent skin darkening, and breast swelling.  PLAN: She is scheduled for a breast MRI on 8/18 and Genetics in the near future. The patient would be seen in a post operative setting in radiation oncology for further evaluation and discussion of breast conservation therapy.     ------------------------------------------------  Blair Promise, PhD, MD  This document serves as a record of services personally performed by Gery Pray, MD. It was created on his behalf by Darcus Austin, a trained medical scribe. The creation of this record is based on the scribe's personal observations and the provider's statements to them. This document has been checked and approved by the attending provider.

## 2015-12-24 ENCOUNTER — Telehealth: Payer: Self-pay | Admitting: Oncology

## 2015-12-24 NOTE — Telephone Encounter (Signed)
lvm to inform pt of 8/21 echo appt at 10 am

## 2015-12-25 ENCOUNTER — Telehealth (HOSPITAL_COMMUNITY): Payer: Self-pay | Admitting: Vascular Surgery

## 2015-12-25 ENCOUNTER — Encounter: Payer: Self-pay | Admitting: General Practice

## 2015-12-25 ENCOUNTER — Encounter: Payer: Self-pay | Admitting: Oncology

## 2015-12-25 ENCOUNTER — Ambulatory Visit
Admission: RE | Admit: 2015-12-25 | Discharge: 2015-12-25 | Disposition: A | Discharge status: Home / Self Care | Payer: Managed Care, Other (non HMO) | Source: Ambulatory Visit | Attending: Oncology | Admitting: Oncology

## 2015-12-25 DIAGNOSIS — C50411 Malignant neoplasm of upper-outer quadrant of right female breast: Secondary | ICD-10-CM

## 2015-12-25 MED ORDER — GADOBENATE DIMEGLUMINE 529 MG/ML IV SOLN
20.0000 mL | Freq: Once | INTRAVENOUS | Status: AC | PRN
  Administered 2015-12-25: 20 mL via INTRAVENOUS

## 2015-12-25 NOTE — Progress Notes (Signed)
Fmla form left in box 12/24/15

## 2015-12-25 NOTE — Progress Notes (Signed)
form left in box- left for dr Jana Hakim to sign

## 2015-12-25 NOTE — Telephone Encounter (Signed)
Left pt message to make new brst appt on [phone]

## 2015-12-25 NOTE — Progress Notes (Signed)
[place] Psychosocial Distress Screening Spiritual Care  Reached [place] by phone following [facility] to introduce [place] team/resources, reviewing distress screen per protocol.  The patient scored a 7 on the Psychosocial Distress Thermometer which indicates severe distress. Also assessed for distress and other psychosocial needs.   ONCBCN DISTRESS SCREENING 12/25/2015  Screening Type Initial Screening  Distress experienced in past week (1-10) 7  Information Concerns Type Lack of info about diagnosis;Lack of info about treatment  Referral to support programs Yes  Other [place] reports good support from family and church.  She is actively processing the distress of dx, tx plan, and assimilation of info from [facility]; this is helping her cope with anxiety and return focus to supporting her health and wellness.  Follow up needed: Yes.   Mailing [place] the full packet of [place], as well as massage forms and certificates.  Per pt's permission, submitting request for an [employer]. We plan to meet following her chemo education class on Friday, August 25 to explore support resources in more detail, but please also page if needs arise/circumstances change.  Thank you.  [place], [place], [facility] Pager [phone] Voicemail [phone]

## 2015-12-27 NOTE — Progress Notes (Signed)
[place]  [phone] Fax:([phone]) [phone]     ID: Lindsay [name] DOB: 05-15-1965  MR#: [id number]  QVZ#:[id number]  Patient Care Team: Bernerd Limbo, MD as PCP - General (Family Medicine) Rolm Bookbinder, MD as Consulting Physician (General Surgery) Chauncey Cruel, MD as Consulting Physician (Oncology) Gery Pray, MD as Consulting Physician (Radiation Oncology) Newton Pigg, MD as Consulting Physician (Obstetrics and Gynecology) Dorna Leitz, MD as Consulting Physician (Orthopedic Surgery) OTHER MD:  CHIEF COMPLAINT: Invasive breast cancer  CURRENT TREATMENT: Neoadjuvant chemotherapy   BREAST CANCER HISTORY: Lindsay [name] had routine screening mammography with tomography at [facility] 12/04/2015. There was a new mass measuring 1.5 cm at the 11:00 position of the right breast. Right breast ultrasonography 12/10/2015 confirmed a 1.9 cm irregular mass in the upper-outer quadrant of the right breast posteriorly. There was a right axillary lymph node with focal cortical thickening.  On 12/14/2015 Lindsay [name] underwent biopsy of the right breast mass at 9:00 position as well as a suspicious lymph node. A separate area in the right breast 10 cm from the nipple was also biopsied biopsied. The final pathology ([id number]) showed the additional area and the lymph node to be benign. The 9:00 mass however was positive for invasive ductal carcinoma, grade 3, estrogen receptor and progesterone receptor negative, with an MIB-1 of 70%, and HER-2 nonamplified with a signals ratio 1.44, and the number per cell 2.70.  Her subsequent history is as detailed below  INTERVAL HISTORY: Lindsay [name] was evaluated in the multidisciplinary breast cancer clinic 12/23/2015, , accompanied by her husband Lindsay [name] Her case was also presented in the multidisciplinary breast cancer conference that same morning. At that time a preliminary plan was proposed: Genetics screening, neoadjuvant chemotherapy, followed by  breast conserving surgery and sentinel lymph node sampling, then radiation.  REVIEW OF SYSTEMS: There were no specific symptoms leading to the original mammogram, which was routinely scheduled. The patient denies unusual headaches, visual changes, nausea, vomiting, stiff neck, dizziness, or gait imbalance. There has been no cough, phlegm production, or pleurisy, no chest pain or pressure, and no change in bowel or bladder habits. The patient denies fever, rash, bleeding, unexplained fatigue or unexplained weight loss. She admits to mild arthritis symptoms here and there, which are not more persistent or intense than prior. A detailed review of systems was otherwise entirely negative.   PAST MEDICAL HISTORY: Past Medical History:  Diagnosis Date  . Allergy    seasonal  . Anemia    prior to hysterectomy  . Arthritis   . Breast cancer of upper-outer quadrant of right female breast ([place]) 12/16/2015  . Depression    mild depression after death of daughter- no meds  . Hot flashes   . Hx of adenomatous colonic polyps 04/25/2014  . Hypertension     PAST SURGICAL HISTORY: Past Surgical History:  Procedure Laterality Date  . ABDOMINAL HYSTERECTOMY    . CHOLECYSTECTOMY N/A 09/09/2015   Procedure: LAPAROSCOPIC CHOLECYSTECTOMY;  Surgeon: Ralene Ok, MD;  Location: [facility];  Service: General;  Laterality: N/A;  . KNEE ARTHROSCOPY  2016    FAMILY HISTORY Family History  Problem Relation Age of Onset  . Breast cancer Maternal Grandfather   . Breast cancer Other   . Breast cancer Cousin   . Sudden death Neg Hx   . Hypertension Neg Hx   . Diabetes Neg Hx   . Heart attack Neg Hx   . Hyperlipidemia Neg Hx   . Colon cancer Neg Hx   .  Esophageal cancer Neg Hx   . Rectal cancer Neg Hx   . Stomach cancer Neg Hx   The patient's father was murdered at age 38. The patient's mother is 37 years old as of August 2017. The patient has one brother, 2 sisters. A cousin was diagnosed with left breast  cancer at age 84. A maternal aunt and a maternal grandmother were also diagnosed with breast cancer at age 78 and 13 respectively.  GYNECOLOGIC HISTORY:  No LMP recorded. Patient has had a hysterectomy.  Menarche age 73, first live birth age 40, the patient is GX P3. She is status post hysterectomy without salpingo-oophorectomy. She did not take hormone replacement. She used oral contraceptives remotely without complications.  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 Her husband Lindsay [name] is a news and record Lexicographer. Son Lindsay [name] lives in [place] and is a Games developer. Son Lindsay [name] more lives in [place] and is a Architectural technologist. The patient had a daughter who died at the age of 69.    ADVANCED DIRECTIVES: Not in place   HEALTH MAINTENANCE: Social History  Substance Use Topics  . Smoking status: Never Smoker  . Smokeless tobacco: Never Used  . Alcohol use No     Colonoscopy: 2016  PAP:  Bone density: Never   Allergies  Allergen Reactions  . Tramadol Nausea And Vomiting  . Vicodin [Hydrocodone-Acetaminophen] Nausea And Vomiting  . Pollen Extract Other (See Comments)    Runny nose, itchy eyes and sneezing due to seasonal allergies.    Current Outpatient Prescriptions  Medication Sig Dispense Refill  . fexofenadine (ALLEGRA) 180 MG tablet Take 180 mg by mouth daily.    Marland Kitchen ibuprofen (ADVIL,MOTRIN) 400 MG tablet Take 1 tablet (400 mg total) by mouth every 6 (six) hours as needed. 30 tablet 0  . lisinopril-hydrochlorothiazide (PRINZIDE,ZESTORETIC) 20-12.5 MG per tablet Take 1 tablet by mouth daily.    . meloxicam (MOBIC) 15 MG tablet Take 1 tablet (15 mg total) by mouth daily as needed for pain. 30 tablet 0  . EPINEPHrine (EPIPEN 2-PAK) 0.3 mg/0.3 mL IJ SOAJ injection Inject 0.3 mg into the muscle once as needed (For anaphylaxis.).      No current facility-administered medications for this visit.     OBJECTIVE: Middle-aged  African-American woman who appears well Vitals:   12/23/15 1256  BP: (!) 150/83  Pulse: 68  Resp: 18  Temp: 97.4 F (36.3 C)     Body mass index is 34.91 kg/m.    ECOG FS:0 - Asymptomatic  Ocular: Sclerae unicteric, pupils equal, round and reactive to light Ear-nose-throat: Oropharynx clear and moist Lymphatic: No cervical or supraclavicular adenopathy Lungs no rales or rhonchi, good excursion bilaterally Heart regular rate and rhythm, no murmur appreciated Abd soft, nontender, positive bowel sounds MSK no focal spinal tenderness, no joint edema Neuro: non-focal, well-oriented, appropriate affect Breasts: The right breast is status post recent biopsy. I do not palpate a well-defined mass. The right axilla is benign. The left breast is unremarkable.  LAB RESULTS:  CMP     Component Value Date/Time   NA 143 12/23/2015 1157   K 3.5 12/23/2015 1157   CL 104 09/08/2015 1110   CO2 28 12/23/2015 1157   GLUCOSE 99 12/23/2015 1157   BUN 19.1 12/23/2015 1157   CREATININE 1.0 12/23/2015 1157   CALCIUM 10.1 12/23/2015 1157   PROT 8.1 12/23/2015 1157   ALBUMIN 4.1 12/23/2015 1157   AST 30  12/23/2015 1157   ALT 26 12/23/2015 1157   ALKPHOS 55 12/23/2015 1157   BILITOT 0.71 12/23/2015 1157   GFRNONAA >60 09/08/2015 1110   GFRAA >60 09/08/2015 1110    INo results found for: SPEP, UPEP  Lab Results  Component Value Date   WBC 4.4 12/23/2015   NEUTROABS 2.2 12/23/2015   HGB 12.4 12/23/2015   HCT 38.7 12/23/2015   MCV 86.4 12/23/2015   PLT 321 12/23/2015      Chemistry      Component Value Date/Time   NA 143 12/23/2015 1157   K 3.5 12/23/2015 1157   CL 104 09/08/2015 1110   CO2 28 12/23/2015 1157   BUN 19.1 12/23/2015 1157   CREATININE 1.0 12/23/2015 1157      Component Value Date/Time   CALCIUM 10.1 12/23/2015 1157   ALKPHOS 55 12/23/2015 1157   AST 30 12/23/2015 1157   ALT 26 12/23/2015 1157   BILITOT 0.71 12/23/2015 1157       No results found for:  LABCA2  No components found for: LABCA125  No results for input(s): INR in the last 168 hours.  Urinalysis    Component Value Date/Time   COLORURINE YELLOW 07/24/2015 1730   APPEARANCEUR CLEAR 07/24/2015 1730   LABSPEC 1.015 07/24/2015 1730   PHURINE 6.5 07/24/2015 1730   GLUCOSEU NEGATIVE 07/24/2015 1730   HGBUR NEGATIVE 07/24/2015 1730   BILIRUBINUR NEGATIVE 07/24/2015 1730   KETONESUR NEGATIVE 07/24/2015 1730   PROTEINUR NEGATIVE 07/24/2015 1730   UROBILINOGEN 1.0 03/03/2015 1240   NITRITE NEGATIVE 07/24/2015 1730   LEUKOCYTESUR NEGATIVE 07/24/2015 1730     STUDIES: Mr Breast Bilateral W Wo Contrast  Result Date: 12/25/2015 CLINICAL DATA:  50 year old with new diagnosis of breast cancer involving the upper outer quadrant of the right breast at posterior depth, biopsy proven invasive ductal carcinoma, grade 2-3. Biopsy of the upper inner quadrant of the left breast at that time revealed benign fibrofatty tissue. Biopsy of a right axillary lymph node demonstrated no evidence of metastatic disease. LABS:  Not applicable. EXAM: BILATERAL BREAST MRI WITH AND WITHOUT CONTRAST TECHNIQUE: Multiplanar, multisequence MR images of both breasts were obtained prior to and following the intravenous administration of 20 ml of Multihance. THREE-DIMENSIONAL MR IMAGE RENDERING ON INDEPENDENT WORKSTATION: Three-dimensional MR images were rendered by post-processing of the original MR data on an independent workstation. The three-dimensional MR images were interpreted, and findings are reported in the following complete MRI report for this study. Three dimensional images were evaluated at the independent DynaCad workstation. COMPARISON:  No prior MRI. Mammography 12/14/2015 (right), 12/04/2015 (bilateral), 11/21/2014 (bilateral) and earlier. Right breast ultrasound 12/14/2015, 12/10/2015, 10/31/2012. Prior imaging was performed at [facility]. FINDINGS: Breast composition: b. Scattered  fibroglandular tissue. Background parenchymal enhancement: Mild. Right breast: Enhancing mass in the upper outer quadrant at posterior depth, biopsy proven invasive ductal carcinoma, measures approximately 2.5 x 1.8 x 2.1 cm, demonstrating washout kinetics. Enhancing mass in the upper outer quadrant at middle depth measures approximately 0.8 cm, demonstrating washout kinetics. Left breast: No mass or abnormal enhancement. Lymph nodes: No abnormal appearing lymph nodes. Ancillary findings:  None. IMPRESSION: 1. Approximate 2.5 cm mass in the upper outer quadrant of the right breast at posterior depth, biopsy proven invasive ductal carcinoma. 2. Approximate 0.8 cm mass in the upper outer quadrant of the right breast at middle depth with suspicious MRI features. While this may just represent an intramammary lymph node, a 2nd site of malignancy is not excluded. 3.  No MRI evidence of malignancy, left breast. 4. No pathologic lymphadenopathy. RECOMMENDATION: MRI guided core needle biopsy of the mass in the upper outer quadrant of the right breast at middle depth. BI-RADS CATEGORY  4: Suspicious. Electronically Signed   By: Evangeline Dakin M.D.   On: 12/25/2015 09:58    ELIGIBLE FOR AVAILABLE RESEARCH PROTOCOL: PREVENT  ASSESSMENT: 50 y.o. [place] woman status post right breast upper outer quadrant biopsy 12/14/2015 for a clinical T1c N0, stage IA  invasive ductal carcinoma, grade 3, triple negative, with an MIB-1 of 70%  (1) genetics testing pending  (2) neoadjuvant chemotherapy to consist of doxorubicin and cyclophosphamide in dose this fashion 4, followed by paclitaxel weekly 12  (3) breast conserving surgery to follow chemotherapy  (4) adjuvant radiation to follow surgery  (5) consider the PREVENT trial   PLAN: We spent the better part of today's hour-long appointment discussing the biology of breast cancer in general, and the specifics of the patient's tumor in particular. We first reviewed the  fact that cancer is not one disease but more than 100 different diseases and that it is important to keep them separate-- otherwise when friends and relatives discuss their own cancer experiences with Katya confusion can result. Similarly we explained that if breast cancer spreads to the bone or liver, the patient would not have bone cancer or liver cancer, but breast cancer in the bone and breast cancer in the liver: one cancer in three places-- not 3 different cancers which otherwise would have to be treated in 3 different ways.  We discussed the difference between local and systemic therapy. In terms of loco-regional treatment, lumpectomy plus radiation is equivalent to mastectomy as far as survival is concerned. For this reason, and because the cosmetic results are generally superior, we generally recommend breast conserving surgery.   We also noted that in terms of sequencing of treatments, whether systemic therapy or surgery is done first does not affect the ultimate outcome. In her case we recommend that we start with systemic treatment so that she has time to get her genetics result and can consider all her local treatment options without concerns regarding the leg.  We then discussed the rationale for systemic therapy. There is some risk that this cancer may have already spread to other parts of her body. Patients frequently ask at this point about bone scans, CAT scans and PET scans to find out if they have occult breast cancer somewhere else. The problem is that in early stage disease we are much more likely to find false positives then true cancers and this would expose the patient to unnecessary procedures as well as unnecessary radiation. Scans cannot answer the question the patient really would like to know, which is whether she has microscopic disease elsewhere in her body. For those reasons we do not recommend them.  Of course we would proceed to aggressive evaluation of any symptoms that  might suggest metastatic disease, but that is not the case here.  Next we went over the options for systemic therapy which are anti-estrogens, anti-HER-2 immunotherapy, and chemotherapy. Rebakah does not meet criteria for anti-estrogens or anti-HER-2 immunotherapy. The only form of systemic treatment available for hers chemotherapy and accordingly this is what we strongly recommended.  We then discussed standard treatment in this setting, which consists of doxorubicin and cyclophosphamide given in dose dense fashion with Neulasta support, followed by weekly paclitaxel 12. We discussed the possible toxicities, side effects and complications of these agents.  Aram Beecham  will need a port and an echocardiogram. We also discussed the PREVENT" study, and she is interested. Our research nurses will follow-up. Yong will also come to "chemotherapy school" for more information regarding her treatment.  She  has a good understanding of the overall plan. She agrees with it. She knows the goal of treatment in her case is cure. She will call with any problems that may develop before her next visit here, which will be August 25 in preparation for chemotherapy start 01/04/2016.  Chauncey Cruel, MD   12/27/2015 11:30 AM Medical Oncology and Hematology [facility] [address] [place], [place] [address] Tel. [phone]    Fax. [phone]

## 2015-12-28 ENCOUNTER — Ambulatory Visit (HOSPITAL_COMMUNITY)
Admission: RE | Admit: 2015-12-28 | Discharge: 2015-12-28 | Disposition: A | Discharge status: Home / Self Care | Payer: Managed Care, Other (non HMO) | Source: Ambulatory Visit | Attending: Oncology | Admitting: Oncology

## 2015-12-28 ENCOUNTER — Encounter: Payer: Self-pay | Admitting: Oncology

## 2015-12-28 ENCOUNTER — Encounter (HOSPITAL_COMMUNITY): Payer: Self-pay | Admitting: *Deleted

## 2015-12-28 ENCOUNTER — Telehealth (HOSPITAL_COMMUNITY): Payer: Self-pay | Admitting: Vascular Surgery

## 2015-12-28 DIAGNOSIS — C50411 Malignant neoplasm of upper-outer quadrant of right female breast: Secondary | ICD-10-CM | POA: Diagnosis not present

## 2015-12-28 NOTE — Progress Notes (Signed)
  Echocardiogram 2D Echocardiogram has been performed.  Jennette Dubin 12/28/2015, 10:56 AM

## 2015-12-28 NOTE — Progress Notes (Signed)
Met with patient in my office today to introduce myself as Estate manager/land agent and to discuss financial options. Asked patient if she knows if she has met her deductible and OOP for the year. Patient states she has met her deductible but was unsure about the OOP. Advised patient this information will let me know if she may need to apply for co-pay assistance for her treatment as well as enroll in the [employer] First Step program for Neulasta. Advised patient to contact her insurance to find out if her OOP has been met and to let me know at our appointment. Discussed Advertising account executive and how to apply. Patient has my card to make an appointment once she has this information available.

## 2015-12-28 NOTE — Progress Notes (Signed)
Spoke with pt for pre-op call. Pt denies cardiac history, chest pain or sob. 

## 2015-12-28 NOTE — Progress Notes (Signed)
Called patient to introduce myself as Estate manager/land agent and to see if she has financial questions or concerns. Will also determine if she may need co-pay assistance by asking if insurance deductible/OOP have been met. Also will mention [employer]. Left voicemail with my contact name and number.

## 2015-12-28 NOTE — Progress Notes (Signed)
Patient called back to inform me she has met everything and insurance will pay at 100%. Patient will not need co-pay assistance. Advised patient she can still apply for the [employer] and I would need proof of income for herself and her spouse. Patient will bring on 01/01/16 to apply for grant.

## 2015-12-28 NOTE — Telephone Encounter (Signed)
Left pt message to make New brst appt

## 2015-12-29 ENCOUNTER — Ambulatory Visit (HOSPITAL_COMMUNITY): Payer: Managed Care, Other (non HMO)

## 2015-12-29 ENCOUNTER — Encounter (HOSPITAL_COMMUNITY): Payer: Self-pay | Admitting: Urology

## 2015-12-29 ENCOUNTER — Telehealth: Payer: Self-pay | Admitting: *Deleted

## 2015-12-29 ENCOUNTER — Ambulatory Visit (HOSPITAL_COMMUNITY): Payer: Managed Care, Other (non HMO) | Admitting: Anesthesiology

## 2015-12-29 ENCOUNTER — Encounter (HOSPITAL_COMMUNITY)
Admission: RE | Disposition: A | Discharge status: Home / Self Care | Payer: Self-pay | Source: Ambulatory Visit | Attending: General Surgery

## 2015-12-29 ENCOUNTER — Encounter: Payer: Self-pay | Admitting: Oncology

## 2015-12-29 ENCOUNTER — Ambulatory Visit (HOSPITAL_COMMUNITY)
Admission: RE | Admit: 2015-12-29 | Discharge: 2015-12-29 | Disposition: A | Discharge status: Home / Self Care | Payer: Managed Care, Other (non HMO) | Source: Ambulatory Visit | Attending: General Surgery | Admitting: General Surgery

## 2015-12-29 DIAGNOSIS — I10 Essential (primary) hypertension: Secondary | ICD-10-CM | POA: Diagnosis not present

## 2015-12-29 DIAGNOSIS — C50919 Malignant neoplasm of unspecified site of unspecified female breast: Secondary | ICD-10-CM

## 2015-12-29 DIAGNOSIS — Z79899 Other long term (current) drug therapy: Secondary | ICD-10-CM | POA: Diagnosis not present

## 2015-12-29 DIAGNOSIS — C50411 Malignant neoplasm of upper-outer quadrant of right female breast: Secondary | ICD-10-CM | POA: Diagnosis present

## 2015-12-29 DIAGNOSIS — Z95828 Presence of other vascular implants and grafts: Secondary | ICD-10-CM

## 2015-12-29 HISTORY — PX: PORTACATH PLACEMENT: SHX2246

## 2015-12-29 LAB — CBC
HCT: 40.2 % (ref 36.0–46.0)
HEMOGLOBIN: 12.9 g/dL (ref 12.0–15.0)
MCH: 28.4 pg (ref 26.0–34.0)
MCHC: 32.1 g/dL (ref 30.0–36.0)
MCV: 88.5 fL (ref 78.0–100.0)
PLATELETS: 302 10*3/uL (ref 150–400)
RBC: 4.54 MIL/uL (ref 3.87–5.11)
RDW: 12.5 % (ref 11.5–15.5)
WBC: 4.6 10*3/uL (ref 4.0–10.5)

## 2015-12-29 LAB — BASIC METABOLIC PANEL
ANION GAP: 8 (ref 5–15)
BUN: 17 mg/dL (ref 6–20)
CALCIUM: 10.3 mg/dL (ref 8.9–10.3)
CO2: 27 mmol/L (ref 22–32)
CREATININE: 0.84 mg/dL (ref 0.44–1.00)
Chloride: 106 mmol/L (ref 101–111)
GFR calc Af Amer: >60 mL/min (ref >60)
GFR calc non Af Amer: >60 mL/min (ref >60)
Glucose, Bld: 100 mg/dL — ABNORMAL HIGH (ref 65–99)
Potassium: 3.1 mmol/L — ABNORMAL LOW (ref 3.5–5.1)
SODIUM: 141 mmol/L (ref 135–145)

## 2015-12-29 SURGERY — INSERTION, TUNNELED CENTRAL VENOUS DEVICE, WITH PORT
Anesthesia: General | Site: Chest | Laterality: Right

## 2015-12-29 MED ORDER — FENTANYL CITRATE (PF) 100 MCG/2ML IJ SOLN
INTRAMUSCULAR | Status: DC | PRN
  Administered 2015-12-29 (×2): 50 ug via INTRAVENOUS

## 2015-12-29 MED ORDER — PROPOFOL 10 MG/ML IV BOLUS
INTRAVENOUS | Status: AC
  Filled 2015-12-29: qty 20

## 2015-12-29 MED ORDER — HEPARIN SODIUM (PORCINE) 5000 UNIT/ML IJ SOLN
INTRAMUSCULAR | Status: DC | PRN
  Administered 2015-12-29: 500 mL

## 2015-12-29 MED ORDER — ONDANSETRON HCL 4 MG/2ML IJ SOLN
INTRAMUSCULAR | Status: AC
  Filled 2015-12-29: qty 2

## 2015-12-29 MED ORDER — LACTATED RINGERS IV SOLN
INTRAVENOUS | Status: DC | PRN
Start: 2015-12-29 — End: 2015-12-29
  Administered 2015-12-29: 07:00:00 via INTRAVENOUS

## 2015-12-29 MED ORDER — BUPIVACAINE HCL (PF) 0.25 % IJ SOLN
INTRAMUSCULAR | Status: AC
  Filled 2015-12-29: qty 30

## 2015-12-29 MED ORDER — HEPARIN SOD (PORK) LOCK FLUSH 100 UNIT/ML IV SOLN
INTRAVENOUS | Status: DC | PRN
  Administered 2015-12-29: 500 [IU] via INTRAVENOUS

## 2015-12-29 MED ORDER — CEFAZOLIN SODIUM-DEXTROSE 2-4 GM/100ML-% IV SOLN
2.0000 g | Freq: Once | INTRAVENOUS | Status: AC
  Administered 2015-12-29: 2 g via INTRAVENOUS

## 2015-12-29 MED ORDER — LIDOCAINE HCL (CARDIAC) 20 MG/ML IV SOLN
INTRAVENOUS | Status: DC | PRN
  Administered 2015-12-29: 60 mg via INTRAVENOUS

## 2015-12-29 MED ORDER — ONDANSETRON HCL 4 MG/2ML IJ SOLN
INTRAMUSCULAR | Status: DC | PRN
  Administered 2015-12-29: 4 mg via INTRAVENOUS

## 2015-12-29 MED ORDER — LIDOCAINE 2% (20 MG/ML) 5 ML SYRINGE
INTRAMUSCULAR | Status: AC
  Filled 2015-12-29: qty 5

## 2015-12-29 MED ORDER — BUPIVACAINE HCL (PF) 0.25 % IJ SOLN
INTRAMUSCULAR | Status: DC | PRN
Start: 2015-12-29 — End: 2015-12-29
  Administered 2015-12-29: 8 mL

## 2015-12-29 MED ORDER — CEFAZOLIN SODIUM-DEXTROSE 2-4 GM/100ML-% IV SOLN
INTRAVENOUS | Status: AC
  Filled 2015-12-29: qty 100

## 2015-12-29 MED ORDER — HEPARIN SOD (PORK) LOCK FLUSH 100 UNIT/ML IV SOLN
INTRAVENOUS | Status: AC
  Filled 2015-12-29: qty 5

## 2015-12-29 MED ORDER — FENTANYL CITRATE (PF) 100 MCG/2ML IJ SOLN: 25.0000 ug | INTRAMUSCULAR | Status: DC | PRN

## 2015-12-29 MED ORDER — MIDAZOLAM HCL 5 MG/5ML IJ SOLN
INTRAMUSCULAR | Status: DC | PRN
  Administered 2015-12-29: 1 mg via INTRAVENOUS

## 2015-12-29 MED ORDER — ONDANSETRON HCL 4 MG/2ML IJ SOLN: 4.0000 mg | Freq: Four times a day (QID) | INTRAMUSCULAR | Status: DC | PRN

## 2015-12-29 MED ORDER — FENTANYL CITRATE (PF) 100 MCG/2ML IJ SOLN
INTRAMUSCULAR | Status: AC
  Filled 2015-12-29: qty 4

## 2015-12-29 MED ORDER — MIDAZOLAM HCL 2 MG/2ML IJ SOLN
INTRAMUSCULAR | Status: AC
  Filled 2015-12-29: qty 2

## 2015-12-29 MED ORDER — PROPOFOL 10 MG/ML IV BOLUS
INTRAVENOUS | Status: DC | PRN
  Administered 2015-12-29: 40 mg via INTRAVENOUS
  Administered 2015-12-29: 200 mg via INTRAVENOUS

## 2015-12-29 SURGICAL SUPPLY — 51 items
BAG DECANTER FOR FLEXI CONT (MISCELLANEOUS) ×3 IMPLANT
BLADE SURG 11 STRL SS (BLADE) ×3 IMPLANT
BLADE SURG 15 STRL LF DISP TIS (BLADE) ×1 IMPLANT
BLADE SURG 15 STRL SS (BLADE) ×3
CANISTER SUCTION 2500CC (MISCELLANEOUS) IMPLANT
CHLORAPREP W/TINT 26ML (MISCELLANEOUS) ×3 IMPLANT
COVER SURGICAL LIGHT HANDLE (MISCELLANEOUS) ×3 IMPLANT
COVER TRANSDUCER ULTRASND GEL (DRAPE) ×2 IMPLANT
CRADLE DONUT ADULT HEAD (MISCELLANEOUS) ×3 IMPLANT
DECANTER SPIKE VIAL GLASS SM (MISCELLANEOUS) ×3 IMPLANT
DRAPE C-ARM 42X72 X-RAY (DRAPES) ×3 IMPLANT
DRAPE LAPAROSCOPIC ABDOMINAL (DRAPES) ×3 IMPLANT
ELECT CAUTERY BLADE 6.4 (BLADE) ×3 IMPLANT
ELECT REM PT RETURN 9FT ADLT (ELECTROSURGICAL) ×3
ELECTRODE REM PT RTRN 9FT ADLT (ELECTROSURGICAL) ×1 IMPLANT
GAUZE SPONGE 4X4 16PLY XRAY LF (GAUZE/BANDAGES/DRESSINGS) ×3 IMPLANT
GEL ULTRASOUND 20GR AQUASONIC (MISCELLANEOUS) ×2 IMPLANT
GLOVE BIO SURGEON STRL SZ7 (GLOVE) ×3 IMPLANT
GLOVE BIOGEL PI IND STRL 7.5 (GLOVE) ×1 IMPLANT
GLOVE BIOGEL PI INDICATOR 7.5 (GLOVE) ×6
GOWN STRL REUS W/ TWL LRG LVL3 (GOWN DISPOSABLE) ×2 IMPLANT
GOWN STRL REUS W/TWL LRG LVL3 (GOWN DISPOSABLE) ×9
INTRODUCER COOK 11FR (CATHETERS) IMPLANT
KIT BASIN OR (CUSTOM PROCEDURE TRAY) ×3 IMPLANT
KIT PORT POWER 8FR ISP CVUE (Catheter) ×2 IMPLANT
KIT ROOM TURNOVER OR (KITS) ×3 IMPLANT
LIQUID BAND (GAUZE/BANDAGES/DRESSINGS) ×3 IMPLANT
NDL HYPO 25GX1X1/2 BEV (NEEDLE) ×1 IMPLANT
NEEDLE HYPO 25GX1X1/2 BEV (NEEDLE) ×3 IMPLANT
NS IRRIG 1000ML POUR BTL (IV SOLUTION) ×3 IMPLANT
PACK SURGICAL SETUP 50X90 (CUSTOM PROCEDURE TRAY) ×3 IMPLANT
PAD ARMBOARD 7.5X6 YLW CONV (MISCELLANEOUS) ×6 IMPLANT
PENCIL BUTTON HOLSTER BLD 10FT (ELECTRODE) ×3 IMPLANT
SET INTRODUCER 12FR PACEMAKER (SHEATH) IMPLANT
SET SHEATH INTRODUCER 10FR (MISCELLANEOUS) IMPLANT
SHEATH COOK PEEL AWAY SET 9F (SHEATH) IMPLANT
STAPLER VISISTAT 35W (STAPLE) ×3 IMPLANT
SUT MNCRL AB 4-0 PS2 18 (SUTURE) ×3 IMPLANT
SUT PROLENE 2 0 SH DA (SUTURE) ×3 IMPLANT
SUT SILK 2 0 (SUTURE) ×3
SUT SILK 2-0 18XBRD TIE 12 (SUTURE) IMPLANT
SUT VIC AB 3-0 SH 27 (SUTURE) ×3
SUT VIC AB 3-0 SH 27XBRD (SUTURE) ×1 IMPLANT
SYR 20ML ECCENTRIC (SYRINGE) ×6 IMPLANT
SYR 5ML LUER SLIP (SYRINGE) ×3 IMPLANT
SYR CONTROL 10ML LL (SYRINGE) IMPLANT
TOWEL OR 17X24 6PK STRL BLUE (TOWEL DISPOSABLE) ×3 IMPLANT
TOWEL OR 17X26 10 PK STRL BLUE (TOWEL DISPOSABLE) ×3 IMPLANT
TUBE CONNECTING 12'X1/4 (SUCTIONS) ×1
TUBE CONNECTING 12X1/4 (SUCTIONS) ×1 IMPLANT
YANKAUER SUCT BULB TIP NO VENT (SUCTIONS) ×2 IMPLANT

## 2015-12-29 NOTE — Anesthesia Procedure Notes (Signed)
Procedure Name: LMA Insertion Date/Time: 12/29/2015 7:43 AM Performed by: Terrill Mohr Pre-anesthesia Checklist: Patient identified, Emergency Drugs available, Suction available and Patient being monitored Patient Re-evaluated:Patient Re-evaluated prior to inductionOxygen Delivery Method: Circle system utilized Preoxygenation: Pre-oxygenation with 100% oxygen Intubation Type: IV induction Ventilation: Mask ventilation without difficulty LMA: LMA inserted LMA Size: 4.0 Number of attempts: 2 Placement Confirmation: positive ETCO2 and breath sounds checked- equal and bilateral Tube secured with: Tape (taped across cheeks) Dental Injury: Teeth and Oropharynx as per pre-operative assessment

## 2015-12-29 NOTE — Discharge Instructions (Signed)
    PORT-A-CATH: POST OP INSTRUCTIONS  Always review your discharge instruction sheet given to you by the facility where your surgery was performed.   1. A prescription for pain medication may be given to you upon discharge. Take your pain medication as prescribed, if needed. If narcotic pain medicine is not needed, then you make take acetaminophen (Tylenol) or ibuprofen (Advil) as needed.  2. Take your usually prescribed medications unless otherwise directed. 3. If you need a refill on your pain medication, please contact our office. All narcotic pain medicine now requires a paper prescription.  Phoned in and fax refills are no longer allowed by law.  Prescriptions will not be filled after 5 pm or on weekends.  4. You should follow a light diet for the remainder of the day after your procedure. 5. Most patients will experience some mild swelling and/or bruising in the area of the incision. It may take several days to resolve. 6. It is common to experience some constipation if taking pain medication after surgery. Increasing fluid intake and taking a stool softener (such as Colace) will usually help or prevent this problem from occurring. A mild laxative (Milk of Magnesia or Miralax) should be taken according to package directions if there are no bowel movements after 48 hours.  7. Unless discharge instructions indicate otherwise, you may remove your bandages 48 hours after surgery, and you may shower at that time. You may have steri-strips (small white skin tapes) in place directly over the incision.  These strips should be left on the skin for 7-10 days.  If your surgeon used Dermabond (skin glue) on the incision, you may shower in 24 hours.  The glue will flake off over the next 2-3 weeks.  8. If your port is left accessed at the end of surgery (needle left in port), the dressing cannot get wet and should only by changed by a healthcare professional. When the port is no longer accessed (when the  needle has been removed), follow step 7.   9. ACTIVITIES:  Limit activity involving your arms for the next 72 hours. Do no strenuous exercise or activity for 1 week. You may drive when you are no longer taking prescription pain medication, you can comfortably wear a seatbelt, and you can maneuver your car. 10.You may need to see your doctor in the office for a follow-up appointment.  Please       check with your doctor.  11.When you receive a new Port-a-Cath, you will get a product guide and        ID card.  Please keep them in case you need them.  WHEN TO CALL YOUR DOCTOR ([phone]): 1. Fever over 101.0 2. Chills 3. Continued bleeding from incision 4. Increased redness and tenderness at the site 5. Shortness of breath, difficulty breathing   The clinic staff is available to answer your questions during regular business hours. Please don't hesitate to call and ask to speak to one of the nurses or medical assistants for clinical concerns. If you have a medical emergency, go to the nearest emergency room or call 911.  A surgeon from [facility] is always on call at the hospital.     For further information, please visit [email or web address]      

## 2015-12-29 NOTE — Addendum Note (Signed)
Addendum  created 12/29/15 1435 by Terrill Mohr, CRNA   Anesthesia Event deleted, Anesthesia Event edited

## 2015-12-29 NOTE — H&P (Signed)
50 yof who works at [employer] and is here with family including her husband. she is seen in consultation from Dr Lindsay [name]. she has no prior breast history and no family history. she didnt have mass or dc. she underwent screening mm that shows b density breasts. there is posterior uoq mass present that on [place] is 1.9 cm in size. she also had a 1 cm area and possible node with focal cortical thickening. the node and 1 cm biopsy are benign and concordant. the 1.9 cm mass is a grade III IDC that is TNBC with Ki of 70%. she is here to discuss options   Other Problems Lindsay Slipper, RN; 12/23/2015 8:11 AM) Arthritis High blood pressure Lump In Breast  Past Surgical History Lindsay Slipper, RN; 12/23/2015 8:11 AM) Breast Biopsy Right. Gallbladder Surgery - Laparoscopic Knee Surgery Left.  Diagnostic Studies History Lindsay Slipper, RN; 12/23/2015 8:11 AM) Colonoscopy 1-5 years ago within last year Mammogram within last year Pap Smear 1-5 years ago  Medication History Lindsay Slipper, RN; 12/23/2015 8:12 AM) Medications Reconciled  Social History Lindsay Slipper, RN; 12/23/2015 8:11 AM) Caffeine use Carbonated beverages, Tea. No alcohol use No drug use Tobacco use Never smoker.  Family History Lindsay Slipper, RN; 12/23/2015 8:11 AM) Breast Cancer Family Members In General. Heart Disease Mother. Hypertension Family Members In General, Mother.  Pregnancy / Birth History Lindsay Slipper, RN; 12/23/2015 8:11 AM) Age at menarche 7 years. Age of menopause <43 Contraceptive History Oral contraceptives. Gravida 3 Maternal age 21-20 Para 3    Review of Systems Lindsay Slipper RN; 12/23/2015 8:11 AM) General Not Present- Appetite Loss, Chills, Fatigue, Fever, Night Sweats, Weight Gain and Weight Loss. Skin Not Present- Change in Wart/Mole, Dryness, Hives, Jaundice, New Lesions, Non-Healing Wounds, Rash and Ulcer. HEENT Present- Seasonal Allergies and Wears glasses/contact lenses.  Not Present- Earache, Hearing Loss, Hoarseness, Nose Bleed, Oral Ulcers, Ringing in the Ears, Sinus Pain, Sore Throat, Visual Disturbances and Yellow Eyes. Respiratory Not Present- Bloody sputum, Chronic Cough, Difficulty Breathing, Snoring and Wheezing. Breast Present- Breast Mass. Not Present- Breast Pain, Nipple Discharge and Skin Changes. Cardiovascular Not Present- Chest Pain, Difficulty Breathing Lying Down, Leg Cramps, Palpitations, Rapid Heart Rate, Shortness of Breath and Swelling of Extremities. Gastrointestinal Not Present- Abdominal Pain, Bloating, Bloody Stool, Change in Bowel Habits, Chronic diarrhea, Constipation, Difficulty Swallowing, Excessive gas, Gets full quickly at meals, Hemorrhoids, Indigestion, Nausea, Rectal Pain and Vomiting. Female Genitourinary Not Present- Frequency, Nocturia, Painful Urination, Pelvic Pain and Urgency. Musculoskeletal Not Present- Back Pain, Joint Pain, Joint Stiffness, Muscle Pain, Muscle Weakness and Swelling of Extremities. Neurological Not Present- Decreased Memory, Fainting, Headaches, Numbness, Seizures, Tingling, Tremor, Trouble walking and Weakness. Psychiatric Not Present- Anxiety, Bipolar, Change in Sleep Pattern, Depression, Fearful and Frequent crying. Endocrine Present- Hot flashes. Not Present- Cold Intolerance, Excessive Hunger, Hair Changes, Heat Intolerance and New Diabetes. Hematology Not Present- Blood Thinners, Easy Bruising, Excessive bleeding, Gland problems, HIV and Persistent Infections.   Physical Exam Lindsay Bookbinder MD; 12/23/2015 4:11 PM) General Mental Status-Alert. Orientation-Oriented X3.  Eye Sclera/Conjunctiva - Bilateral-No scleral icterus.  Chest and Lung Exam Chest and lung exam reveals -on auscultation, normal breath sounds, no adventitious sounds and normal vocal resonance.  Breast Nipples-No Discharge. Breast Lump-No Palpable Breast Mass.  Cardiovascular Cardiovascular examination reveals  -normal heart sounds, regular rate and rhythm with no murmurs.  Lymphatic Head & Neck  General Head & Neck Lymphatics: Bilateral - Description - Normal. Axillary  General Axillary Region: Bilateral - Description - Normal.  Note: no [place] adenopathy     Assessment & Plan Lindsay Bookbinder MD; 12/23/2015 4:14 PM) BREAST CANCER OF UPPER-OUTER QUADRANT OF RIGHT FEMALE BREAST (C50.411) Story: genetic testing, port, primary chemotherapy, surgery following chemotherapy We discussed the staging and pathophysiology of breast cancer. We discussed all of the different options for treatment for breast cancer including surgery, chemotherapy, radiation therapy, Herceptin, and antiestrogen therapy. she could undergo surgery now with lump/sn but I think delay for genetics would be reasonable to do chemo first. will get mri as baseline due to chemotherapy. We discussed a sentinel lymph node biopsy at time of surgery as she does not appear to having lymph node involvement right now. We discussed the performance of that with injection of radioactive tracer. We discussed up to a 5% risk lifetime of chronic shoulder pain as well as lymphedema associated with a sentinel lymph node biopsy. We discussed the options for treatment of the breast cancer which included lumpectomy versus a mastectomy. We discussed the performance of the lumpectomy with radioactive seed placement. We discussed a 5-10% chance of a positive margin requiring reexcision in the operating room. We also discussed that she will need radiation therapy if she undergoes lumpectomy. We discussed the mastectomy (removal of whole breast) and the postoperative care for that as well. Mastectomy can be followed by reconstruction. The decision for lumpectomy vs mastectomy has no impact on decision for chemotherapy. Most mastectomy patients will not need radiation therapy. We discussed that there is no difference in her survival whether she undergoes lumpectomy  with radiation therapy or antiestrogen therapy versus a mastectomy. There is also no real difference between her recurrence in the breast. discussed port placement asap with risks/benefits

## 2015-12-29 NOTE — Anesthesia Preprocedure Evaluation (Addendum)
Anesthesia Evaluation  Patient identified by MRN, date of birth, ID band Patient awake    Reviewed: Allergy & Precautions, NPO status , Patient's Chart, lab work & pertinent test results  Airway Mallampati: II  TM Distance: >3 FB Neck ROM: full    Dental  (+) Teeth Intact, Dental Advisory Given   Pulmonary neg pulmonary ROS,    breath sounds clear to auscultation       Cardiovascular hypertension, Pt. on medications  Rhythm:regular Rate:Normal     Neuro/Psych    GI/Hepatic   Endo/Other    Renal/GU      Musculoskeletal  (+) Arthritis ,   Abdominal   Peds  Hematology   Anesthesia Other Findings   Reproductive/Obstetrics Breast CA                            Anesthesia Physical Anesthesia Plan  ASA: II  Anesthesia Plan: General   Post-op Pain Management:    Induction: Intravenous  Airway Management Planned: LMA  Additional Equipment:   Intra-op Plan:   Post-operative Plan:   Informed Consent: I have reviewed the patients History and Physical, chart, labs and discussed the procedure including the risks, benefits and alternatives for the proposed anesthesia with the patient or authorized representative who has indicated his/her understanding and acceptance.     Plan Discussed with: CRNA, Anesthesiologist and Surgeon  Anesthesia Plan Comments:         Anesthesia Quick Evaluation

## 2015-12-29 NOTE — Op Note (Signed)
Preoperative diagnosis: breast cancer need for venous access Postoperative diagnosis: same as above Procedure: right ij US guided powerport insertion Surgeon: Dr Serita Grammes EBL: minimal Anes: general  Specimens none Complications none Drains none Sponge count correct Dispo to pacu stable  Indications: This is a 50yof due to begin systemic therapy for breast cancer. We discussed port placement.   Procedure: After informed consent was obtained the patient was taken to the operating room. She was given antibiotics. Sequential compression devices were on her legs. She was then placed under general anesthesia with an LMA. Then she was prepped and draped in the standard sterile surgical fashion. Surgical timeout was then performed.  I used the ultrasound to identify the right internal jugular vein. I then accessed the vein using the ultrasound. This aspirated blood. I then placed the wire.  This was confirmed by fluoroscopy to be in the correct position. I created a pocket on the right chest. I tunneled the line between the 2 sites. I then dilated the tract and placed the dilator assembly with the sheath. This was done under fluoroscopy. I then removed the sheath and dilator. The wire was also removed. The line was then pulled back to be in the vena cava where there was no ectopy. I hooked this up to the port. I sutured this into place with 2-0 Prolene in 2 places. This aspirated blood and flushed easily. .This was confirmed with a final fluoroscopy. I then closed this with 2-0 Vicryl and 4-0 Monocryl. Dermabond was placed on both the incisions.She tolerated this well and was transferred to the recovery room in stable condition.

## 2015-12-29 NOTE — Interval H&P Note (Signed)
History and Physical Interval Note:  12/29/2015 7:15 AM  Lindsay [name]  has presented today for surgery, with the diagnosis of BREAST CANCER  The various methods of treatment have been discussed with the patient and family. After consideration of risks, benefits and other options for treatment, the patient has consented to  Procedure(s): INSERTION PORT-A-CATH WITH  [place] (N/A) as a surgical intervention .  The patient's history has been reviewed, patient examined, no change in status, stable for surgery.  I have reviewed the patient's chart and labs.  Questions were answered to the patient's satisfaction.     Burnette Sautter

## 2015-12-29 NOTE — Anesthesia Postprocedure Evaluation (Signed)
Anesthesia Post Note  Patient: Lindsay [name]  Procedure(s) Performed: Procedure(s) (LRB): INSERTION PORT-A-CATH WITH ULTRASOUND GUIDANCE (Right)  Patient location during evaluation: PACU Anesthesia Type: General Level of consciousness: awake and alert and patient cooperative Pain management: pain level controlled Vital Signs Assessment: post-procedure vital signs reviewed and stable Respiratory status: spontaneous breathing and respiratory function stable Cardiovascular status: stable Anesthetic complications: no    Last Vitals:  Vitals:   12/29/15 0910 12/29/15 0915  BP:  (!) 150/73  Pulse:    Resp: 13 18  Temp:  36.1 C    Last Pain:  Vitals:   12/29/15 0830  TempSrc:   PainSc: 0-No pain                 Shaymus Eveleth S

## 2015-12-29 NOTE — Progress Notes (Signed)
form left in box- left for dr Jana Hakim to sign- spoke with patient hubby-she will pk up 12/30/15 at front desk

## 2015-12-29 NOTE — Telephone Encounter (Signed)
  Oncology Nurse Navigator Documentation  Navigator Location: [facility] (12/29/15 1500) Navigator Encounter Type: Telephone (12/29/15 1500) Telephone: Lindsay [name] Call;Financial Assistance;Clinic/[facility]C Follow-up (12/29/15 1500)                                        Time Spent with Patient: 30 (12/29/15 1500)

## 2015-12-29 NOTE — Transfer of Care (Signed)
Immediate Anesthesia Transfer of Care Note  Patient: Lindsay [name]  Procedure(s) Performed: Procedure(s): INSERTION PORT-A-CATH WITH ULTRASOUND GUIDANCE (Right)  Patient Location: PACU  Anesthesia Type:General  Level of Consciousness: awake, sedated and patient cooperative  Airway & Oxygen Therapy: Patient Spontanous Breathing and Patient connected to nasal cannula oxygen  Post-op Assessment: Report given to RN, Post -op Vital signs reviewed and stable and Patient moving all extremities  Post vital signs: Reviewed and stable  Last Vitals:  Vitals:   12/29/15 0651  BP: (!) 138/95  Pulse: 79  Resp: 18  Temp: 36.9 C    Last Pain:  Vitals:   12/29/15 0651  TempSrc: Oral         Complications: No apparent anesthesia complications

## 2015-12-30 ENCOUNTER — Encounter: Payer: Self-pay | Admitting: Oncology

## 2015-12-30 ENCOUNTER — Encounter (HOSPITAL_COMMUNITY): Payer: Self-pay | Admitting: General Surgery

## 2015-12-30 NOTE — Progress Notes (Signed)
recd fmla form for hubby- left in box

## 2015-12-31 ENCOUNTER — Encounter: Payer: Self-pay | Admitting: Oncology

## 2015-12-31 NOTE — Progress Notes (Signed)
recd fmla form for hubby- left in box- left for dr. Jana Hakim to sign [employer] form

## 2016-01-01 ENCOUNTER — Encounter: Payer: Self-pay | Admitting: Oncology

## 2016-01-01 ENCOUNTER — Ambulatory Visit (HOSPITAL_BASED_OUTPATIENT_CLINIC_OR_DEPARTMENT_OTHER): Payer: Managed Care, Other (non HMO) | Admitting: Oncology

## 2016-01-01 ENCOUNTER — Telehealth: Payer: Self-pay | Admitting: Oncology

## 2016-01-01 ENCOUNTER — Other Ambulatory Visit: Payer: Managed Care, Other (non HMO)

## 2016-01-01 VITALS — BP 140/85 | HR 78 | Temp 98.4°F | Resp 18 | Ht 66.0 in | Wt 211.8 lb

## 2016-01-01 DIAGNOSIS — C50411 Malignant neoplasm of upper-outer quadrant of right female breast: Secondary | ICD-10-CM | POA: Diagnosis not present

## 2016-01-01 DIAGNOSIS — Z171 Estrogen receptor negative status [ER-]: Secondary | ICD-10-CM | POA: Diagnosis not present

## 2016-01-01 MED ORDER — PROCHLORPERAZINE MALEATE 10 MG PO TABS: 10.0000 mg | ORAL_TABLET | Freq: Four times a day (QID) | ORAL | 1 refills | Status: DC | PRN

## 2016-01-01 MED ORDER — LORAZEPAM 0.5 MG PO TABS: 0.5000 mg | ORAL_TABLET | Freq: Every evening | ORAL | 0 refills | Status: DC | PRN

## 2016-01-01 MED ORDER — DEXAMETHASONE 4 MG PO TABS: ORAL_TABLET | ORAL | 1 refills | Status: DC

## 2016-01-01 MED ORDER — POTASSIUM CHLORIDE ER 10 MEQ PO TBCR
10.0000 meq | EXTENDED_RELEASE_TABLET | Freq: Every day | ORAL | 3 refills | Status: DC
Start: 2016-01-01 — End: 2016-04-15

## 2016-01-01 MED ORDER — LIDOCAINE-PRILOCAINE 2.5-2.5 % EX CREA: TOPICAL_CREAM | CUTANEOUS | 3 refills | Status: DC

## 2016-01-01 NOTE — Telephone Encounter (Signed)
Added appts per [facility] LOS

## 2016-01-01 NOTE — Progress Notes (Signed)
[place]  [phone] [phone]     ID: Lindsay [name] DOB: Dec 31, 1965  MR#: [id number]  JHE#:[id number]  Patient Care Team: Bernerd Limbo, MD as PCP - General (Family Medicine) Rolm Bookbinder, MD as Consulting Physician (General Surgery) Chauncey Cruel, MD as Consulting Physician (Oncology) Gery Pray, MD as Consulting Physician (Radiation Oncology) Newton Pigg, MD as Consulting Physician (Obstetrics and Gynecology) Dorna Leitz, MD as Consulting Physician (Orthopedic Surgery) OTHER MD:  CHIEF COMPLAINT: Invasive breast cancer  CURRENT TREATMENT: Neoadjuvant chemotherapy   BREAST CANCER HISTORY: Lindsay [name] had routine screening mammography with tomography at [facility] 12/04/2015. There was a new mass measuring 1.5 cm at the 11:00 position of the right breast. Right breast ultrasonography 12/10/2015 confirmed a 1.9 cm irregular mass in the upper-outer quadrant of the right breast posteriorly. There was a right axillary lymph node with focal cortical thickening.  On 12/14/2015 Lindsay [name] underwent biopsy of the right breast mass at 9:00 position as well as a suspicious lymph node. A separate area in the right breast 10 cm from the nipple was also biopsied biopsied. The final pathology ([id number]) showed the additional area and the lymph node to be benign. The 9:00 mass however was positive for invasive ductal carcinoma, grade 3, estrogen receptor and progesterone receptor negative, with an MIB-1 of 70%, and HER-2 nonamplified with a signals ratio 1.44, and the number per cell 2.70.  Her subsequent history is as detailed below  INTERVAL HISTORY: Lindsay [name] returns today for follow-up of her triple negative breast cancer accompanied by her husband Lindsay [name] and one of her sisters. Since her last visit here she had her port placed. That went without event. She also had an echocardiogram. She has an ejection fraction in the 55-60% range, with no wall motion  abnormalities. Finally she came to "chemotherapy school". She somehow got the impression that her urine would be read for the entire time of treatment and she also did not quite understand how to use the EMLA cream. Those issues were clarified today.  REVIEW OF SYSTEMS: Aside from some anxiety, which is causing insomnia, a detailed review of systems today was stable.  PAST MEDICAL HISTORY: Past Medical History:  Diagnosis Date  . Allergy    seasonal  . Anemia    prior to hysterectomy  . Arthritis   . Breast cancer of upper-outer quadrant of right female breast ([place]) 12/16/2015  . Depression    mild depression after death of daughter- no meds  . Hot flashes   . Hx of adenomatous colonic polyps 04/25/2014  . Hypertension     PAST SURGICAL HISTORY: Past Surgical History:  Procedure Laterality Date  . ABDOMINAL HYSTERECTOMY    . CHOLECYSTECTOMY N/A 09/09/2015   Procedure: LAPAROSCOPIC CHOLECYSTECTOMY;  Surgeon: Ralene Ok, MD;  Location: [facility];  Service: General;  Laterality: N/A;  . COLONOSCOPY    . KNEE ARTHROSCOPY  2016  . PORTACATH PLACEMENT Right 12/29/2015   Procedure: INSERTION PORT-A-CATH WITH ULTRASOUND GUIDANCE;  Surgeon: Rolm Bookbinder, MD;  Location: [place];  Service: General;  Laterality: Right;    FAMILY HISTORY Family History  Problem Relation Age of Onset  . Hypertension Mother   . Breast cancer Maternal Grandfather   . Breast cancer Other   . Breast cancer Cousin   . Sudden death Neg Hx   . Diabetes Neg Hx   . Heart attack Neg Hx   . Hyperlipidemia Neg Hx   . Colon cancer Neg Hx   . Esophageal  cancer Neg Hx   . Rectal cancer Neg Hx   . Stomach cancer Neg Hx   The patient's father was murdered at age 47. The patient's mother is 76 years old as of August 2017. The patient has one brother, 2 sisters. A cousin was diagnosed with left breast cancer at age 90. A maternal aunt and a maternal grandmother were also diagnosed with breast cancer at age 60 and 73  respectively.  GYNECOLOGIC HISTORY:  No LMP recorded. Patient has had a hysterectomy.  Menarche age 80, first live birth age 33, the patient is GX P3. She is status post hysterectomy without salpingo-oophorectomy. She did not take hormone replacement. She used oral contraceptives remotely without complications.  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 Her husband Lindsay [name] is a news and record Lexicographer. Son Lindsay [name] lives in [place] and is a Games developer. Son Lindsay [name] lives in [place] and is a Architectural technologist. The patient had a daughter who died at the age of 48.    ADVANCED DIRECTIVES: Not in place   HEALTH MAINTENANCE: Social History  Substance Use Topics  . Smoking status: Never Smoker  . Smokeless tobacco: Never Used  . Alcohol use No     Colonoscopy: 2016  PAP:  Bone density: Never   Allergies  Allergen Reactions  . Percocet [Oxycodone-Acetaminophen] Diarrhea and Nausea And Vomiting  . Tramadol Nausea And Vomiting  . Vicodin [Hydrocodone-Acetaminophen] Nausea And Vomiting  . Pollen Extract Other (See Comments)    Runny nose, itchy eyes and sneezing due to seasonal allergies.    Current Outpatient Prescriptions  Medication Sig Dispense Refill  . EPINEPHrine (EPIPEN 2-PAK) 0.3 mg/0.3 mL IJ SOAJ injection Inject 0.3 mg into the muscle once as needed (anaphylaxis allergic reaction).     . fexofenadine (ALLEGRA) 180 MG tablet Take 180 mg by mouth daily.    Marland Kitchen ibuprofen (ADVIL,MOTRIN) 400 MG tablet Take 1 tablet (400 mg total) by mouth every 6 (six) hours as needed. (Patient not taking: Reported on 12/28/2015) 30 tablet 0  . lisinopril-hydrochlorothiazide (PRINZIDE,ZESTORETIC) 20-12.5 MG per tablet Take 1 tablet by mouth daily.    . meloxicam (MOBIC) 15 MG tablet Take 1 tablet (15 mg total) by mouth daily as needed for pain. 30 tablet 0   No current facility-administered medications for this visit.     OBJECTIVE:  Middle-aged African-American woman In no acute distress Vitals:   01/01/16 1437  BP: 140/85  Pulse: 78  Resp: 18  Temp: 98.4 F (36.9 C)     Body mass index is 34.19 kg/m.    ECOG FS:0 - Asymptomatic  Sclerae unicteric, pupils round and equal Oropharynx clear and moist-- no thrush or other lesions No cervical or supraclavicular adenopathy Lungs no rales or rhonchi Heart regular rate and rhythm Abd soft, nontender, positive bowel sounds MSK no focal spinal tenderness, no upper extremity lymphedema Neuro: nonfocal, well oriented, appropriate affect Breasts: Deferred   LAB RESULTS:  CMP     Component Value Date/Time   NA 141 12/29/2015 0704   NA 143 12/23/2015 1157   K 3.1 (L) 12/29/2015 0704   K 3.5 12/23/2015 1157   CL 106 12/29/2015 0704   CO2 27 12/29/2015 0704   CO2 28 12/23/2015 1157   GLUCOSE 100 (H) 12/29/2015 0704   GLUCOSE 99 12/23/2015 1157   BUN 17 12/29/2015 0704   BUN 19.1 12/23/2015 1157   CREATININE 0.84 12/29/2015 0704   CREATININE  1.0 12/23/2015 1157   CALCIUM 10.3 12/29/2015 0704   CALCIUM 10.1 12/23/2015 1157   PROT 8.1 12/23/2015 1157   ALBUMIN 4.1 12/23/2015 1157   AST 30 12/23/2015 1157   ALT 26 12/23/2015 1157   ALKPHOS 55 12/23/2015 1157   BILITOT 0.71 12/23/2015 1157   GFRNONAA >60 12/29/2015 0704   GFRAA >60 12/29/2015 0704    INo results found for: SPEP, UPEP  Lab Results  Component Value Date   WBC 4.6 12/29/2015   NEUTROABS 2.2 12/23/2015   HGB 12.9 12/29/2015   HCT 40.2 12/29/2015   MCV 88.5 12/29/2015   PLT 302 12/29/2015      Chemistry      Component Value Date/Time   NA 141 12/29/2015 0704   NA 143 12/23/2015 1157   K 3.1 (L) 12/29/2015 0704   K 3.5 12/23/2015 1157   CL 106 12/29/2015 0704   CO2 27 12/29/2015 0704   CO2 28 12/23/2015 1157   BUN 17 12/29/2015 0704   BUN 19.1 12/23/2015 1157   CREATININE 0.84 12/29/2015 0704   CREATININE 1.0 12/23/2015 1157      Component Value Date/Time   CALCIUM 10.3  12/29/2015 0704   CALCIUM 10.1 12/23/2015 1157   ALKPHOS 55 12/23/2015 1157   AST 30 12/23/2015 1157   ALT 26 12/23/2015 1157   BILITOT 0.71 12/23/2015 1157       No results found for: LABCA2  No components found for: LABCA125  No results for input(s): INR in the last 168 hours.  Urinalysis    Component Value Date/Time   COLORURINE YELLOW 07/24/2015 1730   APPEARANCEUR CLEAR 07/24/2015 1730   LABSPEC 1.015 07/24/2015 1730   PHURINE 6.5 07/24/2015 1730   GLUCOSEU NEGATIVE 07/24/2015 1730   HGBUR NEGATIVE 07/24/2015 1730   BILIRUBINUR NEGATIVE 07/24/2015 1730   KETONESUR NEGATIVE 07/24/2015 1730   PROTEINUR NEGATIVE 07/24/2015 1730   UROBILINOGEN 1.0 03/03/2015 1240   NITRITE NEGATIVE 07/24/2015 1730   LEUKOCYTESUR NEGATIVE 07/24/2015 1730     STUDIES: Mr Breast Bilateral W Wo Contrast  Result Date: 12/25/2015 CLINICAL DATA:  50 year old with new diagnosis of breast cancer involving the upper outer quadrant of the right breast at posterior depth, biopsy proven invasive ductal carcinoma, grade 2-3. Biopsy of the upper inner quadrant of the left breast at that time revealed benign fibrofatty tissue. Biopsy of a right axillary lymph node demonstrated no evidence of metastatic disease. LABS:  Not applicable. EXAM: BILATERAL BREAST MRI WITH AND WITHOUT CONTRAST TECHNIQUE: Multiplanar, multisequence MR images of both breasts were obtained prior to and following the intravenous administration of 20 ml of Multihance. THREE-DIMENSIONAL MR IMAGE RENDERING ON INDEPENDENT WORKSTATION: Three-dimensional MR images were rendered by post-processing of the original MR data on an independent workstation. The three-dimensional MR images were interpreted, and findings are reported in the following complete MRI report for this study. Three dimensional images were evaluated at the independent DynaCad workstation. COMPARISON:  No prior MRI. Mammography 12/14/2015 (right), 12/04/2015 (bilateral),  11/21/2014 (bilateral) and earlier. Right breast ultrasound 12/14/2015, 12/10/2015, 10/31/2012. Prior imaging was performed at [facility]. FINDINGS: Breast composition: b. Scattered fibroglandular tissue. Background parenchymal enhancement: Mild. Right breast: Enhancing mass in the upper outer quadrant at posterior depth, biopsy proven invasive ductal carcinoma, measures approximately 2.5 x 1.8 x 2.1 cm, demonstrating washout kinetics. Enhancing mass in the upper outer quadrant at middle depth measures approximately 0.8 cm, demonstrating washout kinetics. Left breast: No mass or abnormal enhancement. Lymph nodes: No abnormal appearing  lymph nodes. Ancillary findings:  None. IMPRESSION: 1. Approximate 2.5 cm mass in the upper outer quadrant of the right breast at posterior depth, biopsy proven invasive ductal carcinoma. 2. Approximate 0.8 cm mass in the upper outer quadrant of the right breast at middle depth with suspicious MRI features. While this may just represent an intramammary lymph node, a 2nd site of malignancy is not excluded. 3. No MRI evidence of malignancy, left breast. 4. No pathologic lymphadenopathy. RECOMMENDATION: MRI guided core needle biopsy of the mass in the upper outer quadrant of the right breast at middle depth. BI-RADS CATEGORY  4: Suspicious. Electronically Signed   By: Evangeline Dakin M.D.   On: 12/25/2015 09:58   Dg Chest Port 1 View  Result Date: 12/29/2015 CLINICAL DATA:  Port placement. EXAM: PORTABLE CHEST 1 VIEW COMPARISON:  None. FINDINGS: Power port inserted from a right internal jugular approach. Catheter tip is in the SVC 2 cm above the right atrium. No pneumothorax. Heart size is normal. Perihilar lung opacity is present. No pleural fluid. IMPRESSION: Power port well positioned with the tip in the SVC 2 cm above the right atrium. Bilateral perihilar pulmonary opacity. No pneumothorax or hemothorax. Electronically Signed   By: Nelson Chimes M.D.   On: 12/29/2015  09:03   Dg Fluoro Guide Cv Line-no Report  Result Date: 12/29/2015 CLINICAL DATA:  FLOURO GUIDE CV LINE Fluoroscopy was utilized by the requesting physician.  No radiographic interpretation.    ELIGIBLE FOR AVAILABLE RESEARCH PROTOCOL: PREVENT  ASSESSMENT: 50 y.o.  woman status post right breast upper outer quadrant biopsy 12/14/2015 for a clinical T1c N0, stage IA  invasive ductal carcinoma, grade 3, triple negative, with an MIB-1 of 70%  (1) genetics testing pending  (2) neoadjuvant chemotherapy to consist of doxorubicin and cyclophosphamide in dose this fashion 4, followed by paclitaxel weekly 12  (3) breast conserving surgery to follow chemotherapy  (4) adjuvant radiation to follow surgery  (5) considered the PREVENT trial: decided against it  PLAN: I spent approximately 40 minutes with 2-D and her family reviewing her situation. She will be ready to start chemotherapy next week. Because she will be receiving anthracyclines we suggest that she consider the PREVENT study. After much discussion she decided against it. She really did not have a specific reason is just "too much going on". She just wants to focus on getting her treatments done  I gave her a "roadmap" on how to take her supportive medications and entered all the relevant prescriptions. She understands that some of the information on the medicine bottle may not match what they roadmap says and in that case she should do at the roadmap states. If she hasn't questions of course she will call us.  The plan is to proceed through 4 cycles of doxorubicin and cyclophosphamide in dose dense fashion, to be followed by weekly paclitaxel. She has a good understanding of the possible toxicities, side effects and complications and she also came to "chemotherapy school" and she had some questions related to that which we clarify. She knows to apply the EMLA cream to the port area, which we delineated today, and I gave her a  copy of her echocardiogram which shows a normal ejection fraction.  Her husband's F ML a application could not be located today. I wrote him a note so that he would be able to bring his wife to the appointments and also be with her at least the first 2 or 3 nights after each chemotherapy  treatment.  We discussed her low potassium. She generally does not eat fruit. We are starting her on potassium Gen.: Stable he and we'll follow the labs next draw.  She is not "ready to go". She will see me a week after her first cycle and I asked her to write down side effects and other complications so we can address them at that time.     :Chauncey Cruel, MD   01/01/2016 3:34 PM Medical Oncology and Hematology [facility] [place], [place] [address] Tel. [phone]    Fax. [phone]

## 2016-01-01 NOTE — Progress Notes (Signed)
recd fmla form for hubby- left in box- left for dr. Jana Hakim to sign [employer] mutual form-faxed [employer] form [phone] and left copy for patient copy-sent to medical recrds

## 2016-01-01 NOTE — Progress Notes (Signed)
Met with patient before chemo ed class. Patient brought proof of household income to apply for [employer]. Patient approved for one-time $1000 grant. Patient has a copy of the approval as well as the expense sheet along with the outpatient pharmacy information. Asked patient if she needed anything today from her grant and patient said not today. Patient has my card for any additional financial questions or concerns.

## 2016-01-04 ENCOUNTER — Encounter: Payer: Self-pay | Admitting: Oncology

## 2016-01-04 NOTE — Progress Notes (Signed)
Left fmla forms for hubby willie for dr.magrinat to sign.

## 2016-01-04 NOTE — Progress Notes (Signed)
left fmla for hubby for dr. Jana Hakim to sign

## 2016-01-05 ENCOUNTER — Other Ambulatory Visit: Payer: Self-pay | Admitting: Oncology

## 2016-01-05 ENCOUNTER — Telehealth: Payer: Self-pay | Admitting: *Deleted

## 2016-01-05 ENCOUNTER — Telehealth: Payer: Self-pay | Admitting: Oncology

## 2016-01-05 ENCOUNTER — Ambulatory Visit (HOSPITAL_BASED_OUTPATIENT_CLINIC_OR_DEPARTMENT_OTHER): Payer: Managed Care, Other (non HMO)

## 2016-01-05 ENCOUNTER — Encounter: Payer: Self-pay | Admitting: Oncology

## 2016-01-05 ENCOUNTER — Other Ambulatory Visit (HOSPITAL_BASED_OUTPATIENT_CLINIC_OR_DEPARTMENT_OTHER): Payer: Managed Care, Other (non HMO)

## 2016-01-05 VITALS — BP 149/78 | HR 81 | Temp 98.6°F | Resp 18

## 2016-01-05 DIAGNOSIS — C50411 Malignant neoplasm of upper-outer quadrant of right female breast: Secondary | ICD-10-CM

## 2016-01-05 DIAGNOSIS — Z5111 Encounter for antineoplastic chemotherapy: Secondary | ICD-10-CM

## 2016-01-05 DIAGNOSIS — Z5189 Encounter for other specified aftercare: Secondary | ICD-10-CM | POA: Diagnosis not present

## 2016-01-05 LAB — CBC WITH DIFFERENTIAL/PLATELET
BASO%: 0.6 % (ref 0.0–2.0)
Basophils Absolute: 0 10*3/uL (ref 0.0–0.1)
EOS%: 6.2 % (ref 0.0–7.0)
Eosinophils Absolute: 0.3 10*3/uL (ref 0.0–0.5)
HCT: 36.5 % (ref 34.8–46.6)
HGB: 12 g/dL (ref 11.6–15.9)
LYMPH%: 36.2 % (ref 14.0–49.7)
MCH: 28.4 pg (ref 25.1–34.0)
MCHC: 32.9 g/dL (ref 31.5–36.0)
MCV: 86.3 fL (ref 79.5–101.0)
MONO#: 0.4 10*3/uL (ref 0.1–0.9)
MONO%: 7.4 % (ref 0.0–14.0)
NEUT#: 2.5 10*3/uL (ref 1.5–6.5)
NEUT%: 49.6 % (ref 38.4–76.8)
PLATELETS: 308 10*3/uL (ref 145–400)
RBC: 4.23 10*6/uL (ref 3.70–5.45)
RDW: 12.6 % (ref 11.2–14.5)
WBC: 5 10*3/uL (ref 3.9–10.3)
lymph#: 1.8 10*3/uL (ref 0.9–3.3)

## 2016-01-05 LAB — COMPREHENSIVE METABOLIC PANEL
ALT: 19 U/L (ref 0–55)
ANION GAP: 10 meq/L (ref 3–11)
AST: 26 U/L (ref 5–34)
Albumin: 4 g/dL (ref 3.5–5.0)
Alkaline Phosphatase: 45 U/L (ref 40–150)
BUN: 15.6 mg/dL (ref 7.0–26.0)
CHLORIDE: 105 meq/L (ref 98–109)
CO2: 26 meq/L (ref 22–29)
CREATININE: 0.9 mg/dL (ref 0.6–1.1)
Calcium: 10.1 mg/dL (ref 8.4–10.4)
EGFR: 90 mL/min/{1.73_m2} — ABNORMAL LOW (ref >90)
GLUCOSE: 94 mg/dL (ref 70–140)
Potassium: 3.9 mEq/L (ref 3.5–5.1)
SODIUM: 141 meq/L (ref 136–145)
Total Bilirubin: 0.61 mg/dL (ref 0.20–1.20)
Total Protein: 7.9 g/dL (ref 6.4–8.3)

## 2016-01-05 MED ORDER — SODIUM CHLORIDE 0.9 % IV SOLN
Freq: Once | INTRAVENOUS | Status: AC
  Administered 2016-01-05: 10:00:00 via INTRAVENOUS

## 2016-01-05 MED ORDER — PALONOSETRON HCL INJECTION 0.25 MG/5ML
INTRAVENOUS | Status: AC
  Filled 2016-01-05: qty 5

## 2016-01-05 MED ORDER — SODIUM CHLORIDE 0.9% FLUSH
10.0000 mL | INTRAVENOUS | Status: DC | PRN
  Administered 2016-01-05: 10 mL
  Filled 2016-01-05: qty 10

## 2016-01-05 MED ORDER — DOXORUBICIN HCL CHEMO IV INJECTION 2 MG/ML
60.0000 mg/m2 | Freq: Once | INTRAVENOUS | Status: AC
  Administered 2016-01-05: 128 mg via INTRAVENOUS
  Filled 2016-01-05: qty 64

## 2016-01-05 MED ORDER — PEGFILGRASTIM 6 MG/0.6ML ~~LOC~~ PSKT
6.0000 mg | PREFILLED_SYRINGE | Freq: Once | SUBCUTANEOUS | Status: AC
  Administered 2016-01-05: 6 mg via SUBCUTANEOUS
  Filled 2016-01-05: qty 0.6

## 2016-01-05 MED ORDER — SODIUM CHLORIDE 0.9 % IV SOLN
Freq: Once | INTRAVENOUS | Status: AC
  Administered 2016-01-05: 10:00:00 via INTRAVENOUS
  Filled 2016-01-05: qty 5

## 2016-01-05 MED ORDER — CYCLOPHOSPHAMIDE CHEMO INJECTION 1 GM
600.0000 mg/m2 | Freq: Once | INTRAMUSCULAR | Status: AC
  Administered 2016-01-05: 1280 mg via INTRAVENOUS
  Filled 2016-01-05: qty 64

## 2016-01-05 MED ORDER — PALONOSETRON HCL INJECTION 0.25 MG/5ML
0.2500 mg | Freq: Once | INTRAVENOUS | Status: AC
  Administered 2016-01-05: 0.25 mg via INTRAVENOUS

## 2016-01-05 MED ORDER — HEPARIN SOD (PORK) LOCK FLUSH 100 UNIT/ML IV SOLN
500.0000 [IU] | Freq: Once | INTRAVENOUS | Status: AC | PRN
  Administered 2016-01-05: 500 [IU]
  Filled 2016-01-05: qty 5

## 2016-01-05 NOTE — Telephone Encounter (Signed)
Scheduled nutrition appt per pt request. Desk nurse will put in Epic order

## 2016-01-05 NOTE — Progress Notes (Signed)
left fmla for hubby for dr. Jana Hakim to sign- faxed [phone] and got cpy to wife and medical recrds

## 2016-01-05 NOTE — Progress Notes (Signed)
left [employer] form for dr. feng to sign-faxed [phone] and sent to medical recrds °

## 2016-01-05 NOTE — Patient Instructions (Addendum)
[facility] Discharge Instructions for Patients Receiving Chemotherapy  Today you received the following chemotherapy agents: Adriamycin & Cytoxan.   To help prevent nausea and vomiting after your treatment, we encourage you to take your nausea medication as prescribed.   If you develop nausea and vomiting that is not controlled by your nausea medication, call the clinic.   BELOW ARE SYMPTOMS THAT SHOULD BE REPORTED IMMEDIATELY:  *FEVER GREATER THAN 100.5 F  *CHILLS WITH OR WITHOUT FEVER  NAUSEA AND VOMITING THAT IS NOT CONTROLLED WITH YOUR NAUSEA MEDICATION  *UNUSUAL SHORTNESS OF BREATH  *UNUSUAL BRUISING OR BLEEDING  TENDERNESS IN MOUTH AND THROAT WITH OR WITHOUT PRESENCE OF ULCERS  *URINARY PROBLEMS  *BOWEL PROBLEMS  UNUSUAL RASH Items with * indicate a potential emergency and should be followed up as soon as possible.  Feel free to call the clinic you have any questions or concerns. The clinic phone number is [phone].  Please show the [facility] at check-in to the Emergency Department and triage nurse.  Adriamycin injection What is this medicine? DOXORUBICIN (dox oh ROO bi sin) is a chemotherapy drug. It is used to treat many kinds of cancer like Hodgkin's disease, leukemia, non-Hodgkin's lymphoma, neuroblastoma, sarcoma, and Wilms' tumor. It is also used to treat bladder cancer, breast cancer, lung cancer, ovarian cancer, stomach cancer, and thyroid cancer. This medicine may be used for other purposes; ask your health care provider or pharmacist if you have questions. What should I tell my health care provider before I take this medicine? They need to know if you have any of these conditions: -blood disorders -heart disease, recent heart attack -infection (especially a virus infection such as chickenpox, cold sores, or herpes) -irregular heartbeat -liver disease -recent or ongoing radiation therapy -an unusual or allergic reaction to  doxorubicin, other chemotherapy agents, other medicines, foods, dyes, or preservatives -pregnant or trying to get pregnant -breast-feeding How should I use this medicine? This drug is given as an infusion into a vein. It is administered in a hospital or clinic by a specially trained health care professional. If you have pain, swelling, burning or any unusual feeling around the site of your injection, tell your health care professional right away. Talk to your pediatrician regarding the use of this medicine in children. Special care may be needed. Overdosage: If you think you have taken too much of this medicine contact a poison control center or emergency room at once. NOTE: This medicine is only for you. Do not share this medicine with others. What if I miss a dose? It is important not to miss your dose. Call your doctor or health care professional if you are unable to keep an appointment. What may interact with this medicine? Do not take this medicine with any of the following medications: -cisapride -droperidol -halofantrine -pimozide -zidovudine This medicine may also interact with the following medications: -chloroquine -chlorpromazine -clarithromycin -cyclophosphamide -cyclosporine -erythromycin -medicines for depression, anxiety, or psychotic disturbances -medicines for irregular heart beat like amiodarone, bepridil, dofetilide, encainide, flecainide, propafenone, quinidine -medicines for seizures like ethotoin, fosphenytoin, phenytoin -medicines for nausea, vomiting like dolasetron, ondansetron, palonosetron -medicines to increase blood counts like filgrastim, pegfilgrastim, sargramostim -methadone -methotrexate -pentamidine -progesterone -vaccines -verapamil Talk to your doctor or health care professional before taking any of these medicines: -acetaminophen -aspirin -ibuprofen -ketoprofen -naproxen This list may not describe all possible interactions. Give your  health care provider a list of all the medicines, herbs, non-prescription drugs, or dietary supplements you use. Also  tell them if you smoke, drink alcohol, or use illegal drugs. Some items may interact with your medicine. What should I watch for while using this medicine? Your condition will be monitored carefully while you are receiving this medicine. You will need important blood work done while you are taking this medicine. This drug may make you feel generally unwell. This is not uncommon, as chemotherapy can affect healthy cells as well as cancer cells. Report any side effects. Continue your course of treatment even though you feel ill unless your doctor tells you to stop. Your urine may turn red for a few days after your dose. This is not blood. If your urine is dark or brown, call your doctor. In some cases, you may be given additional medicines to help with side effects. Follow all directions for their use. Call your doctor or health care professional for advice if you get a fever, chills or sore throat, or other symptoms of a cold or flu. Do not treat yourself. This drug decreases your body's ability to fight infections. Try to avoid being around people who are sick. This medicine may increase your risk to bruise or bleed. Call your doctor or health care professional if you notice any unusual bleeding. Be careful brushing and flossing your teeth or using a toothpick because you may get an infection or bleed more easily. If you have any dental work done, tell your dentist you are receiving this medicine. Avoid taking products that contain aspirin, acetaminophen, ibuprofen, naproxen, or ketoprofen unless instructed by your doctor. These medicines may hide a fever. Men and women of childbearing age should use effective birth control methods while using taking this medicine. Do not become pregnant while taking this medicine. There is a potential for serious side effects to an unborn child. Talk to  your health care professional or pharmacist for more information. Do not breast-feed an infant while taking this medicine. Do not let others touch your urine or other body fluids for 5 days after each treatment with this medicine. Caregivers should wear latex gloves to avoid touching body fluids during this time. There is a maximum amount of this medicine you should receive throughout your life. The amount depends on the medical condition being treated and your overall health. Your doctor will watch how much of this medicine you receive in your lifetime. Tell your doctor if you have taken this medicine before. What side effects may I notice from receiving this medicine? Side effects that you should report to your doctor or health care professional as soon as possible: -allergic reactions like skin rash, itching or hives, swelling of the face, lips, or tongue -low blood counts - this medicine may decrease the number of white blood cells, red blood cells and platelets. You may be at increased risk for infections and bleeding. -signs of infection - fever or chills, cough, sore throat, pain or difficulty passing urine -signs of decreased platelets or bleeding - bruising, pinpoint red spots on the skin, black, tarry stools, blood in the urine -signs of decreased red blood cells - unusually weak or tired, fainting spells, lightheadedness -breathing problems -chest pain -fast, irregular heartbeat -mouth sores -nausea, vomiting -pain, swelling, redness at site where injected -pain, tingling, numbness in the hands or feet -swelling of ankles, feet, or hands -unusual bleeding or bruising Side effects that usually do not require medical attention (report to your doctor or health care professional if they continue or are bothersome): -diarrhea -facial flushing -hair loss -loss of   appetite -missed menstrual periods -nail discoloration or damage -red or watery eyes -red colored urine -stomach  upset This list may not describe all possible side effects. Call your doctor for medical advice about side effects. You may report side effects to FDA at 1-800-FDA-1088. Where should I keep my medicine? This drug is given in a hospital or clinic and will not be stored at home. NOTE: This sheet is a summary. It may not cover all possible information. If you have questions about this medicine, talk to your doctor, pharmacist, or health care provider.    2016, [employer]vier/Gold Standard. (2012-08-21 09:54:34)  Cytoxan injection What is this medicine? CYCLOPHOSPHAMIDE (sye kloe FOSS fa mide) is a chemotherapy drug. It slows the growth of cancer cells. This medicine is used to treat many types of cancer like lymphoma, myeloma, leukemia, breast cancer, and ovarian cancer, to name a few. This medicine may be used for other purposes; ask your health care provider or pharmacist if you have questions. What should I tell my health care provider before I take this medicine? They need to know if you have any of these conditions: -blood disorders -history of other chemotherapy -infection -kidney disease -liver disease -recent or ongoing radiation therapy -tumors in the bone marrow -an unusual or allergic reaction to cyclophosphamide, other chemotherapy, other medicines, foods, dyes, or preservatives -pregnant or trying to get pregnant -breast-feeding How should I use this medicine? This drug is usually given as an injection into a vein or muscle or by infusion into a vein. It is administered in a hospital or clinic by a specially trained health care professional. Talk to your pediatrician regarding the use of this medicine in children. Special care may be needed. Overdosage: If you think you have taken too much of this medicine contact a poison control center or emergency room at once. NOTE: This medicine is only for you. Do not share this medicine with others. What if I miss a dose? It is important not  to miss your dose. Call your doctor or health care professional if you are unable to keep an appointment. What may interact with this medicine? This medicine may interact with the following medications: -amiodarone -amphotericin B -azathioprine -certain antiviral medicines for HIV or AIDS such as protease inhibitors (e.g., indinavir, ritonavir) and zidovudine -certain blood pressure medications such as benazepril, captopril, enalapril, fosinopril, lisinopril, moexipril, monopril, perindopril, quinapril, ramipril, trandolapril -certain cancer medications such as anthracyclines (e.g., daunorubicin, doxorubicin), busulfan, cytarabine, paclitaxel, pentostatin, tamoxifen, trastuzumab -certain diuretics such as chlorothiazide, chlorthalidone, hydrochlorothiazide, indapamide, metolazone -certain medicines that treat or prevent blood clots like warfarin -certain muscle relaxants such as succinylcholine -cyclosporine -etanercept -indomethacin -medicines to increase blood counts like filgrastim, pegfilgrastim, sargramostim -medicines used as general anesthesia -metronidazole -natalizumab This list may not describe all possible interactions. Give your health care provider a list of all the medicines, herbs, non-prescription drugs, or dietary supplements you use. Also tell them if you smoke, drink alcohol, or use illegal drugs. Some items may interact with your medicine. What should I watch for while using this medicine? Visit your doctor for checks on your progress. This drug may make you feel generally unwell. This is not uncommon, as chemotherapy can affect healthy cells as well as cancer cells. Report any side effects. Continue your course of treatment even though you feel ill unless your doctor tells you to stop. Drink water or other fluids as directed. Urinate often, even at night. In some cases, you may be given additional medicines to help  with side effects. Follow all directions for their  use. Call your doctor or health care professional for advice if you get a fever, chills or sore throat, or other symptoms of a cold or flu. Do not treat yourself. This drug decreases your body's ability to fight infections. Try to avoid being around people who are sick. This medicine may increase your risk to bruise or bleed. Call your doctor or health care professional if you notice any unusual bleeding. Be careful brushing and flossing your teeth or using a toothpick because you may get an infection or bleed more easily. If you have any dental work done, tell your dentist you are receiving this medicine. You may get drowsy or dizzy. Do not drive, use machinery, or do anything that needs mental alertness until you know how this medicine affects you. Do not become pregnant while taking this medicine or for 1 year after stopping it. Women should inform their doctor if they wish to become pregnant or think they might be pregnant. Men should not father a child while taking this medicine and for 4 months after stopping it. There is a potential for serious side effects to an unborn child. Talk to your health care professional or pharmacist for more information. Do not breast-feed an infant while taking this medicine. This medicine may interfere with the ability to have a child. This medicine has caused ovarian failure in some women. This medicine has caused reduced sperm counts in some men. You should talk with your doctor or health care professional if you are concerned about your fertility. If you are going to have surgery, tell your doctor or health care professional that you have taken this medicine. What side effects may I notice from receiving this medicine? Side effects that you should report to your doctor or health care professional as soon as possible: -allergic reactions like skin rash, itching or hives, swelling of the face, lips, or tongue -low blood counts - this medicine may decrease the number  of white blood cells, red blood cells and platelets. You may be at increased risk for infections and bleeding. -signs of infection - fever or chills, cough, sore throat, pain or difficulty passing urine -signs of decreased platelets or bleeding - bruising, pinpoint red spots on the skin, black, tarry stools, blood in the urine -signs of decreased red blood cells - unusually weak or tired, fainting spells, lightheadedness -breathing problems -dark urine -dizziness -palpitations -swelling of the ankles, feet, hands -trouble passing urine or change in the amount of urine -weight gain -yellowing of the eyes or skin Side effects that usually do not require medical attention (report to your doctor or health care professional if they continue or are bothersome): -changes in nail or skin color -hair loss -missed menstrual periods -mouth sores -nausea, vomiting This list may not describe all possible side effects. Call your doctor for medical advice about side effects. You may report side effects to FDA at 1-800-FDA-1088. Where should I keep my medicine? This drug is given in a hospital or clinic and will not be stored at home. NOTE: This sheet is a summary. It may not cover all possible information. If you have questions about this medicine, talk to your doctor, pharmacist, or health care provider.    2016, [employer] Standard. (2012-03-09 16:22:58)

## 2016-01-05 NOTE — Telephone Encounter (Signed)
  Oncology Nurse Navigator Documentation  Navigator Location: [facility]Med Onc (01/05/16 1600) Navigator Encounter Type: Telephone (01/05/16 1600) Telephone: Outgoing Call (01/05/16 1600)         Patient Visit Type: MedOnc (01/05/16 1600) Treatment Phase: First Chemo Tx (01/05/16 1600)                            Time Spent with Patient: 15 (01/05/16 1600)

## 2016-01-06 ENCOUNTER — Encounter: Payer: Self-pay | Admitting: Oncology

## 2016-01-06 ENCOUNTER — Telehealth: Payer: Self-pay

## 2016-01-06 NOTE — Progress Notes (Signed)
[employer] form for [facility]-left in my box. left for dr. Jana Hakim to sign

## 2016-01-06 NOTE — Telephone Encounter (Signed)
[place] Engineer, structural from [place] called for coding and dx codes. Call transferred to coding dept.

## 2016-01-07 ENCOUNTER — Encounter: Payer: Self-pay | Admitting: Oncology

## 2016-01-07 NOTE — Progress Notes (Signed)
form left in my box=[employer]

## 2016-01-08 ENCOUNTER — Encounter: Payer: Self-pay | Admitting: Oncology

## 2016-01-08 NOTE — Progress Notes (Signed)
form left in my box=[employer]-faxed [phone]-sent to medical recds and copy for patient

## 2016-01-11 ENCOUNTER — Other Ambulatory Visit: Payer: Self-pay | Admitting: Oncology

## 2016-01-13 ENCOUNTER — Ambulatory Visit (HOSPITAL_BASED_OUTPATIENT_CLINIC_OR_DEPARTMENT_OTHER): Payer: Managed Care, Other (non HMO) | Admitting: Oncology

## 2016-01-13 ENCOUNTER — Other Ambulatory Visit (HOSPITAL_BASED_OUTPATIENT_CLINIC_OR_DEPARTMENT_OTHER): Payer: Managed Care, Other (non HMO)

## 2016-01-13 ENCOUNTER — Ambulatory Visit: Payer: Managed Care, Other (non HMO) | Admitting: Nutrition

## 2016-01-13 VITALS — BP 132/73 | HR 73 | Temp 97.3°F | Resp 18 | Ht 66.0 in | Wt 212.6 lb

## 2016-01-13 DIAGNOSIS — C50411 Malignant neoplasm of upper-outer quadrant of right female breast: Secondary | ICD-10-CM

## 2016-01-13 LAB — CBC WITH DIFFERENTIAL/PLATELET
BASO%: 0.5 % (ref 0.0–2.0)
BASOS ABS: 0 10*3/uL (ref 0.0–0.1)
EOS%: 10.5 % — ABNORMAL HIGH (ref 0.0–7.0)
Eosinophils Absolute: 0.3 10*3/uL (ref 0.0–0.5)
HEMATOCRIT: 35.9 % (ref 34.8–46.6)
HGB: 11.6 g/dL (ref 11.6–15.9)
LYMPH#: 1 10*3/uL (ref 0.9–3.3)
LYMPH%: 43.3 % (ref 14.0–49.7)
MCH: 27.8 pg (ref 25.1–34.0)
MCHC: 32.2 g/dL (ref 31.5–36.0)
MCV: 86.1 fL (ref 79.5–101.0)
MONO#: 0.2 10*3/uL (ref 0.1–0.9)
MONO%: 7.7 % (ref 0.0–14.0)
NEUT#: 0.9 10*3/uL — ABNORMAL LOW (ref 1.5–6.5)
NEUT%: 38 % — AB (ref 38.4–76.8)
PLATELETS: 239 10*3/uL (ref 145–400)
RBC: 4.16 10*6/uL (ref 3.70–5.45)
RDW: 12.7 % (ref 11.2–14.5)
WBC: 2.4 10*3/uL — ABNORMAL LOW (ref 3.9–10.3)

## 2016-01-13 LAB — COMPREHENSIVE METABOLIC PANEL
ALT: 23 U/L (ref 0–55)
ANION GAP: 9 meq/L (ref 3–11)
AST: 17 U/L (ref 5–34)
Albumin: 3.7 g/dL (ref 3.5–5.0)
Alkaline Phosphatase: 50 U/L (ref 40–150)
BUN: 14.9 mg/dL (ref 7.0–26.0)
CALCIUM: 9.6 mg/dL (ref 8.4–10.4)
CHLORIDE: 101 meq/L (ref 98–109)
CO2: 29 mEq/L (ref 22–29)
CREATININE: 0.8 mg/dL (ref 0.6–1.1)
Glucose: 92 mg/dl (ref 70–140)
POTASSIUM: 3.9 meq/L (ref 3.5–5.1)
Sodium: 140 mEq/L (ref 136–145)
Total Bilirubin: 0.38 mg/dL (ref 0.20–1.20)
Total Protein: 7.4 g/dL (ref 6.4–8.3)

## 2016-01-13 NOTE — Progress Notes (Signed)
50 year old female diagnosed with triple negative breast cancer.  She is a patient of Dr. Jana Hakim.  Past medical history includes anemia, allergy, depression, and hypertension.  Medications include Decadron, Ativan, and Compazine.  Labs include potassium 3.9, albumin 4.0 on August 29.  Height: 5 foot 6 inches. Weight: 211 pounds. Usual body weight: 215-220 pounds. BMI: 34.19.  Patient reports nausea after chemotherapy beginning when she started her oral nausea medications. Patient denies vomiting. Dietary recall reveals patient does not consume dairy products of any kind. Reports food tastes bad since she got an allergy shot.  Nutrition diagnosis:  Food and nutrition related knowledge deficit related to breast cancer and associated treatments as evidenced by no prior need for nutrition related information.  Intervention: Patient was educated to consume small frequent meals and snacks with high protein foods and adequate calories to promote weight maintenance. Provided fact sheet on increasing calories and protein. Educated patient on strategies for improving nausea and provided fact sheet. Encouraged patient to try juice-based oral nutrition supplements and protein supplements and provided samples. Encouraged patient to rinse her mouth out with baking soda and salt water rinses to address taste alterations. Questions were answered.  Teach back method used.  Contact information given.  Monitoring, evaluation, goals:  Patient will tolerate adequate calories and protein to promote weight maintenance.  Next visit:  Patient will contact me by phone for follow-up as needed.  **Disclaimer: This note was dictated with voice recognition software. Similar sounding words can inadvertently be transcribed and this note may contain transcription errors which may not have been corrected upon publication of note.**

## 2016-01-13 NOTE — Progress Notes (Signed)
[place]  [phone] Fax:([phone]) [phone]     ID: Lindsay [name] DOB: [birth year]  MR#: [id number]  LZJ#:[id number]  Patient Care Team: Bernerd Limbo, MD as PCP - General (Family Medicine) Rolm Bookbinder, MD as Consulting Physician (General Surgery) Chauncey Cruel, MD as Consulting Physician (Oncology) Gery Pray, MD as Consulting Physician (Radiation Oncology) Newton Pigg, MD as Consulting Physician (Obstetrics and Gynecology) Dorna Leitz, MD as Consulting Physician (Orthopedic Surgery) OTHER MD:  CHIEF COMPLAINT: Invasive breast cancer  CURRENT TREATMENT: Neoadjuvant chemotherapy   BREAST CANCER HISTORY: From the original intake note:  Lindsay [name] had routine screening mammography with [facility] 12/04/2015. There was a new mass measuring 1.5 cm at the 11:00 position of the right breast. Right breast ultrasonography 12/10/2015 confirmed a 1.9 cm irregular mass in the upper-outer quadrant of the right breast posteriorly. There was a right axillary lymph node with focal cortical thickening.  On 12/14/2015 Lindsay [name] underwent biopsy of the right breast mass at 9:00 position as well as a suspicious lymph node. A separate area in the right breast 10 cm from the nipple was also biopsied biopsied. The final pathology ([id number]) showed the additional area and the lymph node to be benign. The 9:00 mass however was positive for invasive ductal carcinoma, grade 3, estrogen receptor and progesterone receptor negative, with an MIB-1 of 70%, and HER-2 nonamplified with a signals ratio 1.44, and the number per cell 2.70.  Her subsequent history is as detailed below  INTERVAL HISTORY: Lindsay [name] returns today for follow-up of her breast cancer accompanied by her husband [name] . Today is day 9 cycle 1 of 4 planned cycles of cyclophosphamide and doxorubicin given every 2 weeks, to be followed by weekly paclitaxel 12  REVIEW OF SYSTEMS: Lindsay [name] had significant  problems with her first cycle of chemotherapy. She was very fatigued the first week and spent a great deal of time in bed. The dexamethasone upset her stomach and made her nauseated. She took the prochlorperazine 3 times a day instead of 4, and that worked fairly well for her. The lorazepam did not help her sleep. She had no mouth sores, no actual vomiting, no change in bowel or bladder habits, no rash, bleeding, or fever. Her port worked well. A detailed review of systems was otherwise stable.  PAST MEDICAL HISTORY: Past Medical History:  Diagnosis Date  . Allergy    seasonal  . Anemia    prior to hysterectomy  . Arthritis   . Breast cancer of upper-outer quadrant of right female breast (Lindsay [name]) 12/16/2015  . Depression    mild depression after death of daughter- no meds  . Hot flashes   . Hx of adenomatous colonic polyps 04/25/2014  . Hypertension     PAST SURGICAL HISTORY: Past Surgical History:  Procedure Laterality Date  . ABDOMINAL HYSTERECTOMY    . CHOLECYSTECTOMY N/A 09/09/2015   Procedure: LAPAROSCOPIC CHOLECYSTECTOMY;  Surgeon: Ralene Ok, MD;  Location: [facility];  Service: General;  Laterality: N/A;  . COLONOSCOPY    . KNEE ARTHROSCOPY  2016  . PORTACATH PLACEMENT Right 12/29/2015   Procedure: INSERTION PORT-A-CATH WITH ULTRASOUND GUIDANCE;  Surgeon: Rolm Bookbinder, MD;  Location: [facility];  Service: General;  Laterality: Right;    FAMILY HISTORY Family History  Problem Relation Age of Onset  . Hypertension Mother   . Breast cancer Maternal Grandfather   . Breast cancer Other   . Breast cancer Cousin   . Sudden death Neg Hx   . Diabetes  Neg Hx   . Heart attack Neg Hx   . Hyperlipidemia Neg Hx   . Colon cancer Neg Hx   . Esophageal cancer Neg Hx   . Rectal cancer Neg Hx   . Stomach cancer Neg Hx   The patient's father was murdered at age 16. The patient's mother is 17 years old as of August 2017. The patient has one brother, 2 sisters. A cousin was diagnosed with  left breast cancer at age 89. A maternal aunt and a maternal grandmother were also diagnosed with breast cancer at age 97 and 40 respectively.  GYNECOLOGIC HISTORY:  No LMP recorded. Patient has had a hysterectomy.  Menarche age 51, first live birth age 50, the patient is GX P3. She is status post hysterectomy without salpingo-oophorectomy. She did not take hormone replacement. She used oral contraceptives remotely without complications.  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 Her husband [name] [place] is a news and record Lexicographer. Son [name] lives in [place] and is a Games developer. Son [name] lives in [place] and is a Architectural technologist. The patient had a daughter who died at the age of 61.    ADVANCED DIRECTIVES: Not in place   HEALTH MAINTENANCE: Social History  Substance Use Topics  . Smoking status: Never Smoker  . Smokeless tobacco: Never Used  . Alcohol use No     Colonoscopy: 2016  PAP:  Bone density: Never   Allergies  Allergen Reactions  . Percocet [Oxycodone-Acetaminophen] Diarrhea and Nausea And Vomiting  . Tramadol Nausea And Vomiting  . Vicodin [Hydrocodone-Acetaminophen] Nausea And Vomiting  . Pollen Extract Other (See Comments)    Runny nose, itchy eyes and sneezing due to seasonal allergies.    Current Outpatient Prescriptions  Medication Sig Dispense Refill  . EPINEPHrine (EPIPEN 2-PAK) 0.3 mg/0.3 mL IJ SOAJ injection Inject 0.3 mg into the muscle once as needed (anaphylaxis allergic reaction).     . fexofenadine (ALLEGRA) 180 MG tablet Take 180 mg by mouth daily.    Marland Kitchen ibuprofen (ADVIL,MOTRIN) 400 MG tablet Take 1 tablet (400 mg total) by mouth every 6 (six) hours as needed. (Patient not taking: Reported on 12/28/2015) 30 tablet 0  . lidocaine-prilocaine (EMLA) cream Apply to affected area once 30 g 3  . lisinopril-hydrochlorothiazide (PRINZIDE,ZESTORETIC) 20-12.5 MG per tablet Take 1 tablet by mouth daily.      Marland Kitchen LORazepam (ATIVAN) 0.5 MG tablet Take 1 tablet (0.5 mg total) by mouth at bedtime as needed (Nausea or vomiting). 30 tablet 0  . meloxicam (MOBIC) 15 MG tablet Take 1 tablet (15 mg total) by mouth daily as needed for pain. 30 tablet 0  . potassium chloride (KLOR-CON 10) 10 MEQ tablet Take 1 tablet (10 mEq total) by mouth daily. 90 tablet 3  . prochlorperazine (COMPAZINE) 10 MG tablet Take 1 tablet (10 mg total) by mouth every 6 (six) hours as needed (Nausea or vomiting). 30 tablet 1   No current facility-administered medications for this visit.     OBJECTIVE: Middle-aged African-American woman Who appears stated age 50:   01/13/16 1251  BP: 132/73  Pulse: 73  Resp: 18  Temp: 97.3 F (36.3 C)     Body mass index is 34.31 kg/m.    ECOG FS:1 - Symptomatic but completely ambulatory  Sclerae unicteric, EOMs intact Oropharynx clear and moist No cervical or supraclavicular adenopathy Lungs no rales or rhonchi Heart regular rate and rhythm Abd soft, nontender, positive  bowel sounds MSK no focal spinal tenderness, no upper extremity lymphedema Neuro: nonfocal, well oriented, appropriate affect Breasts: Deferred   LAB RESULTS:  CMP     Component Value Date/Time   NA 140 01/13/2016 1032   K 3.9 01/13/2016 1032   CL 106 12/29/2015 0704   CO2 29 01/13/2016 1032   GLUCOSE 92 01/13/2016 1032   BUN 14.9 01/13/2016 1032   CREATININE 0.8 01/13/2016 1032   CALCIUM 9.6 01/13/2016 1032   PROT 7.4 01/13/2016 1032   ALBUMIN 3.7 01/13/2016 1032   AST 17 01/13/2016 1032   ALT 23 01/13/2016 1032   ALKPHOS 50 01/13/2016 1032   BILITOT 0.38 01/13/2016 1032   GFRNONAA >60 12/29/2015 0704   GFRAA >60 12/29/2015 0704    INo results found for: SPEP, UPEP  Lab Results  Component Value Date   WBC 2.4 (L) 01/13/2016   NEUTROABS 0.9 (L) 01/13/2016   HGB 11.6 01/13/2016   HCT 35.9 01/13/2016   MCV 86.1 01/13/2016   PLT 239 01/13/2016      Chemistry      Component Value Date/Time    NA 140 01/13/2016 1032   K 3.9 01/13/2016 1032   CL 106 12/29/2015 0704   CO2 29 01/13/2016 1032   BUN 14.9 01/13/2016 1032   CREATININE 0.8 01/13/2016 1032      Component Value Date/Time   CALCIUM 9.6 01/13/2016 1032   ALKPHOS 50 01/13/2016 1032   AST 17 01/13/2016 1032   ALT 23 01/13/2016 1032   BILITOT 0.38 01/13/2016 1032       No results found for: LABCA2  No components found for: LABCA125  No results for input(s): INR in the last 168 hours.  Urinalysis    Component Value Date/Time   COLORURINE YELLOW 07/24/2015 1730   APPEARANCEUR CLEAR 07/24/2015 1730   LABSPEC 1.015 07/24/2015 1730   PHURINE 6.5 07/24/2015 1730   GLUCOSEU NEGATIVE 07/24/2015 1730   HGBUR NEGATIVE 07/24/2015 1730   BILIRUBINUR NEGATIVE 07/24/2015 1730   KETONESUR NEGATIVE 07/24/2015 1730   PROTEINUR NEGATIVE 07/24/2015 1730   UROBILINOGEN 1.0 03/03/2015 1240   NITRITE NEGATIVE 07/24/2015 1730   LEUKOCYTESUR NEGATIVE 07/24/2015 1730     STUDIES: Mr Breast Bilateral W Wo Contrast  Result Date: 12/25/2015 CLINICAL DATA:  50 year old with new diagnosis of breast cancer involving the upper outer quadrant of the right breast at posterior depth, biopsy proven invasive ductal carcinoma, grade 2-3. Biopsy of the upper inner quadrant of the left breast at that time revealed benign fibrofatty tissue. Biopsy of a right axillary lymph node demonstrated no evidence of metastatic disease. LABS:  Not applicable. EXAM: BILATERAL BREAST MRI WITH AND WITHOUT CONTRAST TECHNIQUE: Multiplanar, multisequence MR images of both breasts were obtained prior to and following the intravenous administration of 20 ml of Multihance. THREE-DIMENSIONAL MR IMAGE RENDERING ON INDEPENDENT WORKSTATION: Three-dimensional MR images were rendered by post-processing of the original MR data on an independent workstation. The three-dimensional MR images were interpreted, and findings are reported in the following complete MRI report for  this study. Three dimensional images were evaluated at the independent DynaCad workstation. COMPARISON:  No prior MRI. Mammography 12/14/2015 (right), 12/04/2015 (bilateral), 11/21/2014 (bilateral) and earlier. Right breast ultrasound 12/14/2015, 12/10/2015, 10/31/2012. Prior imaging was performed at [facility]. FINDINGS: Breast composition: b. Scattered fibroglandular tissue. Background parenchymal enhancement: Mild. Right breast: Enhancing mass in the upper outer quadrant at posterior depth, biopsy proven invasive ductal carcinoma, measures approximately 2.5 x 1.8 x 2.1 cm, demonstrating washout  kinetics. Enhancing mass in the upper outer quadrant at middle depth measures approximately 0.8 cm, demonstrating washout kinetics. Left breast: No mass or abnormal enhancement. Lymph nodes: No abnormal appearing lymph nodes. Ancillary findings:  None. IMPRESSION: 1. Approximate 2.5 cm mass in the upper outer quadrant of the right breast at posterior depth, biopsy proven invasive ductal carcinoma. 2. Approximate 0.8 cm mass in the upper outer quadrant of the right breast at middle depth with suspicious MRI features. While this may just represent an intramammary lymph node, a 2nd site of malignancy is not excluded. 3. No MRI evidence of malignancy, left breast. 4. No pathologic lymphadenopathy. RECOMMENDATION: MRI guided core needle biopsy of the mass in the upper outer quadrant of the right breast at middle depth. BI-RADS CATEGORY  4: Suspicious. Electronically Signed   By: Evangeline Dakin M.D.   On: 12/25/2015 09:58   Dg Chest Port 1 View  Result Date: 12/29/2015 CLINICAL DATA:  Port placement. EXAM: PORTABLE CHEST 1 VIEW COMPARISON:  None. FINDINGS: Power port inserted from a right internal jugular approach. Catheter tip is in the SVC 2 cm above the right atrium. No pneumothorax. Heart size is normal. Perihilar lung opacity is present. No pleural fluid. IMPRESSION: Power port well positioned with the tip  in the SVC 2 cm above the right atrium. Bilateral perihilar pulmonary opacity. No pneumothorax or hemothorax. Electronically Signed   By: Nelson Chimes M.D.   On: 12/29/2015 09:03   Dg Fluoro Guide Cv Line-no Report  Result Date: 12/29/2015 CLINICAL DATA:  FLOURO GUIDE CV LINE Fluoroscopy was utilized by the requesting physician.  No radiographic interpretation.    ELIGIBLE FOR AVAILABLE RESEARCH PROTOCOL: PREVENT  ASSESSMENT: 50 y.o. [place] woman status post right breast upper outer quadrant biopsy 12/14/2015 for a clinical T1c N0, stage IA  invasive ductal carcinoma, grade 3, triple negative, with an MIB-1 of 70%  (1) genetics testing pending  (2) neoadjuvant chemotherapy to consist of doxorubicin and cyclophosphamide in dose this fashion 4, followed by paclitaxel weekly 12  (3) breast conserving surgery to follow chemotherapy  (4) adjuvant radiation to follow surgery  (5) considered the PREVENT trial: decided against it  PLAN: Angelin did only moderately well with her first cycle of chemotherapy and I think we can improve on it.  We're going to discontinue her prophylactic dexamethasone. She will take the prochlorperazine with meals and at bedtime. She will let us know if that does not control her nausea.  I think she is at risk of developing associated nausea. We discussed that extensively today. I suggested she can also take the lorazepam up to 4 times a day, half to 1 tablet, as needed if she is nauseated, to avoid the Association. She understands this will make her sleepy.  Otherwise I'm delighted that her neutrophils held up as well as a have. She was again reminded of how to avoid infections and the importance of hand washing.  She understands she is going to be losing her hair in the next week or so. She has no specific plan on how to deal with this. We discussed hats and bandannas. She Will Return for Her Second Cycle of Chemotherapy Next Week and I Am Adding Intravenous  Fluids on Day 3 to See If This Makes a Difference to Her Experience.. She Will See Me Again the Following Week.   She Knows to Call for Any Problems That May Develop before that visit    :Chauncey Cruel, MD   01/13/2016 5:56  PM Medical Oncology and Hematology [facility] [address] [place], [place] [address] Tel. [phone]    Fax. [phone] you

## 2016-01-18 ENCOUNTER — Other Ambulatory Visit (HOSPITAL_BASED_OUTPATIENT_CLINIC_OR_DEPARTMENT_OTHER): Payer: Managed Care, Other (non HMO)

## 2016-01-18 ENCOUNTER — Encounter: Payer: Self-pay | Admitting: *Deleted

## 2016-01-18 ENCOUNTER — Encounter: Payer: Managed Care, Other (non HMO) | Admitting: Nutrition

## 2016-01-18 ENCOUNTER — Ambulatory Visit (HOSPITAL_BASED_OUTPATIENT_CLINIC_OR_DEPARTMENT_OTHER): Payer: Managed Care, Other (non HMO)

## 2016-01-18 VITALS — BP 119/74 | HR 80 | Temp 99.0°F | Resp 16

## 2016-01-18 DIAGNOSIS — Z5189 Encounter for other specified aftercare: Secondary | ICD-10-CM | POA: Diagnosis not present

## 2016-01-18 DIAGNOSIS — C50411 Malignant neoplasm of upper-outer quadrant of right female breast: Secondary | ICD-10-CM | POA: Diagnosis not present

## 2016-01-18 DIAGNOSIS — Z5111 Encounter for antineoplastic chemotherapy: Secondary | ICD-10-CM | POA: Diagnosis not present

## 2016-01-18 LAB — COMPREHENSIVE METABOLIC PANEL
ALBUMIN: 3.6 g/dL (ref 3.5–5.0)
ALT: 28 U/L (ref 0–55)
AST: 21 U/L (ref 5–34)
Alkaline Phosphatase: 52 U/L (ref 40–150)
Anion Gap: 9 mEq/L (ref 3–11)
BUN: 14.6 mg/dL (ref 7.0–26.0)
CHLORIDE: 103 meq/L (ref 98–109)
CO2: 29 meq/L (ref 22–29)
Calcium: 9.8 mg/dL (ref 8.4–10.4)
Creatinine: 0.9 mg/dL (ref 0.6–1.1)
EGFR: 86 mL/min/{1.73_m2} — ABNORMAL LOW (ref >90)
GLUCOSE: 96 mg/dL (ref 70–140)
POTASSIUM: 4.1 meq/L (ref 3.5–5.1)
SODIUM: 141 meq/L (ref 136–145)
Total Bilirubin: 0.3 mg/dL (ref 0.20–1.20)
Total Protein: 7.2 g/dL (ref 6.4–8.3)

## 2016-01-18 LAB — CBC WITH DIFFERENTIAL/PLATELET
BASO%: 0.7 % (ref 0.0–2.0)
BASOS ABS: 0.1 10*3/uL (ref 0.0–0.1)
EOS%: 0.1 % (ref 0.0–7.0)
Eosinophils Absolute: 0 10*3/uL (ref 0.0–0.5)
HCT: 34.9 % (ref 34.8–46.6)
HEMOGLOBIN: 11.2 g/dL — AB (ref 11.6–15.9)
LYMPH%: 13.7 % — AB (ref 14.0–49.7)
MCH: 28.1 pg (ref 25.1–34.0)
MCHC: 32.1 g/dL (ref 31.5–36.0)
MCV: 87.7 fL (ref 79.5–101.0)
MONO#: 0.7 10*3/uL (ref 0.1–0.9)
MONO%: 6.9 % (ref 0.0–14.0)
NEUT#: 7.9 10*3/uL — ABNORMAL HIGH (ref 1.5–6.5)
NEUT%: 78.6 % — ABNORMAL HIGH (ref 38.4–76.8)
Platelets: 174 10*3/uL (ref 145–400)
RBC: 3.98 10*6/uL (ref 3.70–5.45)
RDW: 12.7 % (ref 11.2–14.5)
WBC: 10.1 10*3/uL (ref 3.9–10.3)
lymph#: 1.4 10*3/uL (ref 0.9–3.3)

## 2016-01-18 MED ORDER — PALONOSETRON HCL INJECTION 0.25 MG/5ML
0.2500 mg | Freq: Once | INTRAVENOUS | Status: AC
  Administered 2016-01-18: 0.25 mg via INTRAVENOUS

## 2016-01-18 MED ORDER — SODIUM CHLORIDE 0.9 % IV SOLN
Freq: Once | INTRAVENOUS | Status: AC
  Administered 2016-01-18: 11:00:00 via INTRAVENOUS
  Filled 2016-01-18: qty 5

## 2016-01-18 MED ORDER — SODIUM CHLORIDE 0.9% FLUSH
10.0000 mL | INTRAVENOUS | Status: DC | PRN
  Administered 2016-01-18: 10 mL
  Filled 2016-01-18: qty 10

## 2016-01-18 MED ORDER — PALONOSETRON HCL INJECTION 0.25 MG/5ML
INTRAVENOUS | Status: AC
  Filled 2016-01-18: qty 5

## 2016-01-18 MED ORDER — PEGFILGRASTIM 6 MG/0.6ML ~~LOC~~ PSKT
6.0000 mg | PREFILLED_SYRINGE | Freq: Once | SUBCUTANEOUS | Status: AC
  Administered 2016-01-18: 6 mg via SUBCUTANEOUS
  Filled 2016-01-18: qty 0.6

## 2016-01-18 MED ORDER — HEPARIN SOD (PORK) LOCK FLUSH 100 UNIT/ML IV SOLN
500.0000 [IU] | Freq: Once | INTRAVENOUS | Status: AC | PRN
  Administered 2016-01-18: 500 [IU]
  Filled 2016-01-18: qty 5

## 2016-01-18 MED ORDER — DOXORUBICIN HCL CHEMO IV INJECTION 2 MG/ML
60.0000 mg/m2 | Freq: Once | INTRAVENOUS | Status: AC
  Administered 2016-01-18: 128 mg via INTRAVENOUS
  Filled 2016-01-18: qty 64

## 2016-01-18 MED ORDER — SODIUM CHLORIDE 0.9 % IV SOLN
Freq: Once | INTRAVENOUS | Status: AC
  Administered 2016-01-18: 11:00:00 via INTRAVENOUS

## 2016-01-18 MED ORDER — SODIUM CHLORIDE 0.9 % IV SOLN
600.0000 mg/m2 | Freq: Once | INTRAVENOUS | Status: AC
  Administered 2016-01-18: 1280 mg via INTRAVENOUS
  Filled 2016-01-18: qty 64

## 2016-01-18 NOTE — Patient Instructions (Signed)
[facility] Discharge Instructions for Patients Receiving Chemotherapy  Today you received the following chemotherapy agents : Adriamycin,  Cytoxan.  To help prevent nausea and vomiting after your treatment, we encourage you to take your nausea medication as prescribed.   If you develop nausea and vomiting that is not controlled by your nausea medication, call the clinic.   BELOW ARE SYMPTOMS THAT SHOULD BE REPORTED IMMEDIATELY:  *FEVER GREATER THAN 100.5 F  *CHILLS WITH OR WITHOUT FEVER  NAUSEA AND VOMITING THAT IS NOT CONTROLLED WITH YOUR NAUSEA MEDICATION  *UNUSUAL SHORTNESS OF BREATH  *UNUSUAL BRUISING OR BLEEDING  TENDERNESS IN MOUTH AND THROAT WITH OR WITHOUT PRESENCE OF ULCERS  *URINARY PROBLEMS  *BOWEL PROBLEMS  UNUSUAL RASH Items with * indicate a potential emergency and should be followed up as soon as possible.  Feel free to call the clinic you have any questions or concerns. The clinic phone number is [phone].  Please show the CHEMO ALERT CARD at check-in to the Emergency Department and triage nurse.   

## 2016-01-20 ENCOUNTER — Ambulatory Visit (HOSPITAL_BASED_OUTPATIENT_CLINIC_OR_DEPARTMENT_OTHER): Payer: Managed Care, Other (non HMO)

## 2016-01-20 VITALS — BP 129/90 | HR 78 | Temp 98.6°F | Resp 16

## 2016-01-20 DIAGNOSIS — C50411 Malignant neoplasm of upper-outer quadrant of right female breast: Secondary | ICD-10-CM | POA: Diagnosis not present

## 2016-01-20 DIAGNOSIS — R110 Nausea: Secondary | ICD-10-CM | POA: Diagnosis not present

## 2016-01-20 DIAGNOSIS — Z5189 Encounter for other specified aftercare: Secondary | ICD-10-CM | POA: Diagnosis not present

## 2016-01-20 MED ORDER — PROMETHAZINE HCL 25 MG/ML IJ SOLN
25.0000 mg | Freq: Once | INTRAMUSCULAR | Status: AC
  Administered 2016-01-20: 25 mg via INTRAVENOUS
  Filled 2016-01-20: qty 1

## 2016-01-20 MED ORDER — SODIUM CHLORIDE 0.9 % IJ SOLN
10.0000 mL | INTRAMUSCULAR | Status: DC | PRN
  Administered 2016-01-20: 10 mL
  Filled 2016-01-20: qty 10

## 2016-01-20 MED ORDER — HEPARIN SOD (PORK) LOCK FLUSH 100 UNIT/ML IV SOLN
500.0000 [IU] | Freq: Once | INTRAVENOUS | Status: AC | PRN
  Administered 2016-01-20: 500 [IU]
  Filled 2016-01-20: qty 5

## 2016-01-20 MED ORDER — SODIUM CHLORIDE 0.9 % IV SOLN
1000.0000 mL | Freq: Once | INTRAVENOUS | Status: AC
  Administered 2016-01-20: 1000 mL via INTRAVENOUS

## 2016-01-20 NOTE — Patient Instructions (Signed)

## 2016-01-24 ENCOUNTER — Emergency Department (HOSPITAL_COMMUNITY): Payer: Managed Care, Other (non HMO)

## 2016-01-24 ENCOUNTER — Emergency Department (HOSPITAL_COMMUNITY)
Admission: EM | Admit: 2016-01-24 | Discharge: 2016-01-24 | Disposition: A | Discharge status: Home / Self Care | Payer: Managed Care, Other (non HMO) | Attending: Emergency Medicine | Admitting: Emergency Medicine

## 2016-01-24 ENCOUNTER — Encounter (HOSPITAL_COMMUNITY): Payer: Self-pay

## 2016-01-24 ENCOUNTER — Other Ambulatory Visit: Payer: Self-pay

## 2016-01-24 DIAGNOSIS — E041 Nontoxic single thyroid nodule: Secondary | ICD-10-CM | POA: Diagnosis not present

## 2016-01-24 DIAGNOSIS — I10 Essential (primary) hypertension: Secondary | ICD-10-CM | POA: Insufficient documentation

## 2016-01-24 DIAGNOSIS — R0789 Other chest pain: Secondary | ICD-10-CM | POA: Diagnosis present

## 2016-01-24 DIAGNOSIS — Z79899 Other long term (current) drug therapy: Secondary | ICD-10-CM | POA: Insufficient documentation

## 2016-01-24 DIAGNOSIS — Z853 Personal history of malignant neoplasm of breast: Secondary | ICD-10-CM | POA: Diagnosis not present

## 2016-01-24 LAB — CBC WITH DIFFERENTIAL/PLATELET
Basophils Absolute: 0 K/uL (ref 0.0–0.1)
Basophils Relative: 1 %
Eosinophils Absolute: 0 K/uL (ref 0.0–0.7)
Eosinophils Relative: 1 %
HCT: 36.9 % (ref 36.0–46.0)
Hemoglobin: 12.2 g/dL (ref 12.0–15.0)
Lymphocytes Relative: 25 %
Lymphs Abs: 0.4 K/uL — ABNORMAL LOW (ref 0.7–4.0)
MCH: 28.6 pg (ref 26.0–34.0)
MCHC: 33.1 g/dL (ref 30.0–36.0)
MCV: 86.6 fL (ref 78.0–100.0)
Monocytes Absolute: 0 K/uL — ABNORMAL LOW (ref 0.1–1.0)
Monocytes Relative: 2 %
Neutro Abs: 1.1 K/uL — ABNORMAL LOW (ref 1.7–7.7)
Neutrophils Relative %: 71 %
Platelets: 258 K/uL (ref 150–400)
RBC: 4.26 MIL/uL (ref 3.87–5.11)
RDW: 12.9 % (ref 11.5–15.5)
WBC: 1.5 K/uL — ABNORMAL LOW (ref 4.0–10.5)

## 2016-01-24 LAB — I-STAT CHEM 8, ED
BUN: 19 mg/dL (ref 6–20)
Calcium, Ion: 1.08 mmol/L — ABNORMAL LOW (ref 1.15–1.40)
Chloride: 103 mmol/L (ref 101–111)
Creatinine, Ser: 0.8 mg/dL (ref 0.44–1.00)
Glucose, Bld: 103 mg/dL — ABNORMAL HIGH (ref 65–99)
HCT: 38 % (ref 36.0–46.0)
Hemoglobin: 12.9 g/dL (ref 12.0–15.0)
Potassium: 3.7 mmol/L (ref 3.5–5.1)
Sodium: 138 mmol/L (ref 135–145)
TCO2: 25 mmol/L (ref 0–100)

## 2016-01-24 LAB — COMPREHENSIVE METABOLIC PANEL WITH GFR
ALT: 60 U/L — ABNORMAL HIGH (ref 14–54)
AST: 35 U/L (ref 15–41)
Albumin: 4.5 g/dL (ref 3.5–5.0)
Alkaline Phosphatase: 45 U/L (ref 38–126)
Anion gap: 11 (ref 5–15)
BUN: 20 mg/dL (ref 6–20)
CO2: 24 mmol/L (ref 22–32)
Calcium: 9.8 mg/dL (ref 8.9–10.3)
Chloride: 102 mmol/L (ref 101–111)
Creatinine, Ser: 0.86 mg/dL (ref 0.44–1.00)
GFR calc Af Amer: >60 mL/min
GFR calc non Af Amer: >60 mL/min
Glucose, Bld: 106 mg/dL — ABNORMAL HIGH (ref 65–99)
Potassium: 3.7 mmol/L (ref 3.5–5.1)
Sodium: 137 mmol/L (ref 135–145)
Total Bilirubin: 1.3 mg/dL — ABNORMAL HIGH (ref 0.3–1.2)
Total Protein: 7.9 g/dL (ref 6.5–8.1)

## 2016-01-24 LAB — I-STAT TROPONIN, ED: Troponin i, poc: 0.01 ng/mL (ref 0.00–0.08)

## 2016-01-24 MED ORDER — IOPAMIDOL (ISOVUE-370) INJECTION 76%
100.0000 mL | Freq: Once | INTRAVENOUS | Status: AC | PRN
Start: 2016-01-24 — End: 2016-01-24
  Administered 2016-01-24: 100 mL via INTRAVENOUS

## 2016-01-24 NOTE — ED Notes (Signed)
I-Stat Troponin = 0.01 (comment: result did not crossover)

## 2016-01-24 NOTE — ED Triage Notes (Signed)
Sternal chest pain started PTA to ER with some nausea no other sx voiced.

## 2016-01-24 NOTE — ED Provider Notes (Signed)
[facility] DEPT Provider Note   CSN: [id number] Arrival date & time: 01/24/16  0114  By signing my name below, I, Hansel Feinstein, attest that this documentation has been prepared under the direction and in the presence of Kinleigh Nault, MD. Electronically Signed: Hansel Feinstein, ED Scribe. 01/24/16. 3:25 AM.     History   Chief Complaint Chief Complaint  Patient presents with  . Chest Pain    HPI Lindsay [name] is a 50 y.o. female with h/o breast cancer who presents to the Emergency Department complaining of an episode of moderate substernal CP onset 2 hours ago, lasting an hour. Pt describes her pain as pressure. Pt states she was going to bed when her pain began and it resolved on its own while she rested. Pt denies taking OTC medications at home to improve symptoms.  Pt has had two rounds of chemo and states she has not previously experienced similar symptoms with treatments. She also reports no h/o similar symptoms prior to receiving chemo. Pt states she has experienced tingling to bilateral hands since starting chemo, but states this has not worsened. She denies cough, wheezing, diaphoresis, emesis, fever, exertional SOB, diarrhea, BLE edema.   The history is provided by the patient. No language interpreter was used.  Chest Pain   This is a new problem. The current episode started 1 to 2 hours ago. The problem has been resolved. The pain is associated with rest. The pain is present in the substernal region. The patient is experiencing no pain. The quality of the pain is described as pressure-like. The pain does not radiate. Duration of episode(s) is 1 hour. Pertinent negatives include no cough, no diaphoresis, no fever, no shortness of breath and no vomiting. She has tried rest for the symptoms. The treatment provided significant relief.  Her past medical history is significant for cancer.    Past Medical History:  Diagnosis Date  . Allergy    seasonal  . Anemia    prior to  hysterectomy  . Arthritis   . Breast cancer of upper-outer quadrant of right female breast ([place]) 12/16/2015  . Depression    mild depression after death of daughter- no meds  . Hot flashes   . Hx of adenomatous colonic polyps 04/25/2014  . Hypertension     Patient Active Problem List   Diagnosis Date Noted  . Breast cancer of upper-outer quadrant of right female breast ([place]) 12/16/2015  . Hx of adenomatous colonic polyps 04/25/2014  . Neck pain 11/06/2012    Past Surgical History:  Procedure Laterality Date  . ABDOMINAL HYSTERECTOMY    . CHOLECYSTECTOMY N/A 09/09/2015   Procedure: LAPAROSCOPIC CHOLECYSTECTOMY;  Surgeon: Ralene Ok, MD;  Location: [facility];  Service: General;  Laterality: N/A;  . COLONOSCOPY    . KNEE ARTHROSCOPY  2016  . PORTACATH PLACEMENT Right 12/29/2015   Procedure: INSERTION PORT-A-CATH WITH ULTRASOUND GUIDANCE;  Surgeon: Rolm Bookbinder, MD;  Location: [facility];  Service: General;  Laterality: Right;    OB History    No data available       Home Medications    Prior to Admission medications   Medication Sig Start Date End Date Taking? Authorizing Provider  EPINEPHrine (EPIPEN 2-PAK) 0.3 mg/0.3 mL IJ SOAJ injection Inject 0.3 mg into the muscle once as needed (anaphylaxis allergic reaction).  01/01/14   Historical Provider, MD  fexofenadine (ALLEGRA) 180 MG tablet Take 180 mg by mouth daily.    Historical Provider, MD  ibuprofen (ADVIL,MOTRIN) 400 MG  tablet Take 1 tablet (400 mg total) by mouth every 6 (six) hours as needed. Patient not taking: Reported on 12/28/2015 11/25/15   Montine Circle, PA-C  lidocaine-prilocaine (EMLA) cream Apply to affected area once 01/01/16   Chauncey Cruel, MD  lisinopril-hydrochlorothiazide (PRINZIDE,ZESTORETIC) 20-12.5 MG per tablet Take 1 tablet by mouth daily.    Historical Provider, MD  LORazepam (ATIVAN) 0.5 MG tablet Take 1 tablet (0.5 mg total) by mouth at bedtime as needed (Nausea or vomiting). 01/01/16   Chauncey Cruel, MD  meloxicam (MOBIC) 15 MG tablet Take 1 tablet (15 mg total) by mouth daily as needed for pain. 10/05/15   Dorie Rank, MD  potassium chloride (KLOR-CON 10) 10 MEQ tablet Take 1 tablet (10 mEq total) by mouth daily. 01/01/16   Chauncey Cruel, MD  prochlorperazine (COMPAZINE) 10 MG tablet Take 1 tablet (10 mg total) by mouth every 6 (six) hours as needed (Nausea or vomiting). 01/01/16   Chauncey Cruel, MD    Family History Family History  Problem Relation Age of Onset  . Hypertension Mother   . Breast cancer Maternal Grandfather   . Breast cancer Other   . Breast cancer Cousin   . Sudden death Neg Hx   . Diabetes Neg Hx   . Heart attack Neg Hx   . Hyperlipidemia Neg Hx   . Colon cancer Neg Hx   . Esophageal cancer Neg Hx   . Rectal cancer Neg Hx   . Stomach cancer Neg Hx     Social History Social History  Substance Use Topics  . Smoking status: Never Smoker  . Smokeless tobacco: Never Used  . Alcohol use No     Allergies   Percocet [oxycodone-acetaminophen]; Tramadol; Vicodin [hydrocodone-acetaminophen]; and Pollen extract   Review of Systems Review of Systems  Constitutional: Negative for diaphoresis and fever.  Respiratory: Negative for cough, shortness of breath and wheezing.   Cardiovascular: Positive for chest pain. Negative for leg swelling.  Gastrointestinal: Negative for diarrhea and vomiting.  All other systems reviewed and are negative.    Physical Exam Updated Vital Signs BP 116/78 (BP Location: Left Arm)   Pulse 87   Temp 98.1 F (36.7 C) (Oral)   Resp 16   Ht 5\' 6"  (1.676 m)   Wt 212 lb (96.2 kg)   SpO2 100%   BMI 34.22 kg/m   Physical Exam  Constitutional: She is oriented to person, place, and time. She appears well-developed and well-nourished.  HENT:  Head: Normocephalic and atraumatic.  Mouth/Throat: Oropharynx is clear and moist. No oropharyngeal exudate.  Moist mucous membranes. No exudates.   Eyes: Conjunctivae and EOM  are normal. Pupils are equal, round, and reactive to light.  Neck: Normal range of motion. Neck supple. No JVD present. No tracheal deviation present.  No carotid bruits. Trachea midline.   Cardiovascular: Normal rate, regular rhythm and normal heart sounds.  Exam reveals no gallop and no friction rub.   No murmur heard. RRR. 3+ DP bilaterally.   Pulmonary/Chest: Effort normal and breath sounds normal. No stridor. No respiratory distress. She has no wheezes. She has no rales.  Lungs CTA bilaterally.   Abdominal: Soft. Bowel sounds are normal. She exhibits no distension. There is no rebound and no guarding.  Musculoskeletal: Normal range of motion. She exhibits no edema.  All compartments soft. No BLE edema.   Lymphadenopathy:    She has no cervical adenopathy.  Neurological: She is alert and oriented to person, place,  and time. She has normal reflexes.  Skin: Skin is warm and dry.  Psychiatric: She has a normal mood and affect.  Nursing note and vitals reviewed.    ED Treatments / Results   DIAGNOSTIC STUDIES: Oxygen Saturation is 98% on RA, normal by my interpretation.    COORDINATION OF CARE: 3:20 AM Discussed treatment plan with pt at bedside which includes CXR, lab work and pt agreed to plan.    Labs (all labs ordered are listed, but only abnormal results are displayed) Labs Reviewed - No data to display   EKG Interpretation  Date/Time:  Sunday January 24 2016 01:26:44 EDT Ventricular Rate:  92 PR Interval:    QRS Duration: 84 QT Interval:  337 QTC Calculation: 417 R Axis:   74 Text Interpretation:  Sinus rhythm Nonspecific T abnormalities, diffuse leads Confirmed by [facility]  MD, Cena Bruhn ([id number]) on 01/24/2016 3:24:00 AM      Results for orders placed or performed during the hospital encounter of 01/24/16  CBC with Differential/Platelet  Result Value Ref Range   WBC 1.5 (L) 4.0 - 10.5 K/uL   RBC 4.26 3.87 - 5.11 MIL/uL   Hemoglobin 12.2 12.0 - 15.0 g/dL    HCT 36.9 36.0 - 46.0 %   MCV 86.6 78.0 - 100.0 fL   MCH 28.6 26.0 - 34.0 pg   MCHC 33.1 30.0 - 36.0 g/dL   RDW 12.9 11.5 - 15.5 %   Platelets 258 150 - 400 K/uL   Neutrophils Relative % 71 %   Lymphocytes Relative 25 %   Monocytes Relative 2 %   Eosinophils Relative 1 %   Basophils Relative 1 %   Neutro Abs 1.1 (L) 1.7 - 7.7 K/uL   Lymphs Abs 0.4 (L) 0.7 - 4.0 K/uL   Monocytes Absolute 0.0 (L) 0.1 - 1.0 K/uL   Eosinophils Absolute 0.0 0.0 - 0.7 K/uL   Basophils Absolute 0.0 0.0 - 0.1 K/uL   WBC Morphology MILD LEFT SHIFT (1-5% METAS, OCC MYELO, OCC BANDS)    Smear Review LARGE PLATELETS PRESENT   Comprehensive metabolic panel  Result Value Ref Range   Sodium 137 135 - 145 mmol/L   Potassium 3.7 3.5 - 5.1 mmol/L   Chloride 102 101 - 111 mmol/L   CO2 24 22 - 32 mmol/L   Glucose, Bld 106 (H) 65 - 99 mg/dL   BUN 20 6 - 20 mg/dL   Creatinine, Ser 0.86 0.44 - 1.00 mg/dL   Calcium 9.8 8.9 - 10.3 mg/dL   Total Protein 7.9 6.5 - 8.1 g/dL   Albumin 4.5 3.5 - 5.0 g/dL   AST 35 15 - 41 U/L   ALT 60 (H) 14 - 54 U/L   Alkaline Phosphatase 45 38 - 126 U/L   Total Bilirubin 1.3 (H) 0.3 - 1.2 mg/dL   GFR calc non Af Amer >60 >60 mL/min   GFR calc Af Amer >60 >60 mL/min   Anion gap 11 5 - 15  I-Stat Chem 8, ED  Result Value Ref Range   Sodium 138 135 - 145 mmol/L   Potassium 3.7 3.5 - 5.1 mmol/L   Chloride 103 101 - 111 mmol/L   BUN 19 6 - 20 mg/dL   Creatinine, Ser 0.80 0.44 - 1.00 mg/dL   Glucose, Bld 103 (H) 65 - 99 mg/dL   Calcium, Ion 1.08 (L) 1.15 - 1.40 mmol/L   TCO2 25 0 - 100 mmol/L   Hemoglobin 12.9 12.0 - 15.0 g/dL  HCT 38.0 36.0 - 46.0 %  I-stat troponin, ED  Result Value Ref Range   Troponin i, poc 0.01 0.00 - 0.08 ng/mL   Comment 3           Dg Chest 2 View  Result Date: 01/24/2016 CLINICAL DATA:  Mid chest pain, onset tonight. Current chemotherapy for breast cancer. EXAM: CHEST  2 VIEW COMPARISON:  Chest radiograph 12/29/2015 FINDINGS: Tip of the right chest port  in the SVC. The cardiomediastinal contours are normal. Previous perihilar opacities are resolved. Pulmonary vasculature is normal. No consolidation, pleural effusion, or pneumothorax. No acute osseous abnormalities are seen. IMPRESSION: No active cardiopulmonary disease. Previous perihilar opacities have resolved. Electronically Signed   By: Jeb Levering M.D.   On: 01/24/2016 03:57   Ct Angio Chest Pe W And/or Wo Contrast  Result Date: 01/24/2016 CLINICAL DATA:  Substernal chest pain. Recent diagnosis of breast cancer. EXAM: CT ANGIOGRAPHY CHEST WITH CONTRAST TECHNIQUE: Multidetector CT imaging of the chest was performed using the standard protocol during bolus administration of intravenous contrast. Multiplanar CT image reconstructions and MIPs were obtained to evaluate the vascular anatomy. CONTRAST:  100 cc Isovue 370 IV COMPARISON:  Chest radiograph earlier this day FINDINGS: Cardiovascular: Satisfactory opacification of the pulmonary arteries to the segmental level. No evidence of pulmonary embolism. Normal heart size. No pericardial effusion. Mediastinum/Nodes: Heterogeneous enlargement of the left lobe of the thyroid, with possible 2 cm nodule. No mediastinal adenopathy. No mediastinal mass. The esophagus is decompressed. Right chest port remains in place. Clip in the right breast. No definite axillary adenopathy. Lungs/Pleura: No consolidation, pulmonary nodule, or pulmonary edema. No pleural fluid. Upper Abdomen: No acute abnormality. Musculoskeletal: There are no acute or suspicious osseous abnormalities. Degenerative changes in the spine with endplate spurring. Review of the MIP images confirms the above findings. IMPRESSION: 1. No pulmonary embolus or acute intrathoracic process. 2. Enlarged left lobe of the thyroid with possible ill-defined 2 cm nodule. Recommend nonemergent thyroid ultrasound for characterization. Electronically Signed   By: Jeb Levering M.D.   On: 01/24/2016 05:52   Mr  Breast Bilateral W Wo Contrast  Result Date: 12/25/2015 CLINICAL DATA:  50 year old with new diagnosis of breast cancer involving the upper outer quadrant of the right breast at posterior depth, biopsy proven invasive ductal carcinoma, grade 2-3. Biopsy of the upper inner quadrant of the left breast at that time revealed benign fibrofatty tissue. Biopsy of a right axillary lymph node demonstrated no evidence of metastatic disease. LABS:  Not applicable. EXAM: BILATERAL BREAST MRI WITH AND WITHOUT CONTRAST TECHNIQUE: Multiplanar, multisequence MR images of both breasts were obtained prior to and following the intravenous administration of 20 ml of Multihance. THREE-DIMENSIONAL MR IMAGE RENDERING ON INDEPENDENT WORKSTATION: Three-dimensional MR images were rendered by post-processing of the original MR data on an independent workstation. The three-dimensional MR images were interpreted, and findings are reported in the following complete MRI report for this study. Three dimensional images were evaluated at the independent DynaCad workstation. COMPARISON:  No prior MRI. Mammography 12/14/2015 (right), 12/04/2015 (bilateral), 11/21/2014 (bilateral) and earlier. Right breast ultrasound 12/14/2015, 12/10/2015, 10/31/2012. Prior [facility] was [facility] at [facility]. FINDINGS: Breast composition: b. Scattered fibroglandular tissue. Background parenchymal enhancement: Mild. Right breast: Enhancing mass in the upper outer quadrant at posterior depth, biopsy proven invasive ductal carcinoma, measures approximately 2.5 x 1.8 x 2.1 cm, demonstrating washout kinetics. Enhancing mass in the upper outer quadrant at middle depth measures approximately 0.8 cm, demonstrating washout kinetics. Left breast: No  mass or abnormal enhancement. Lymph nodes: No abnormal appearing lymph nodes. Ancillary findings:  None. IMPRESSION: 1. Approximate 2.5 cm mass in the upper outer quadrant of the right breast at posterior depth, biopsy  proven invasive ductal carcinoma. 2. Approximate 0.8 cm mass in the upper outer quadrant of the right breast at middle depth with suspicious MRI features. While this may just represent an intramammary lymph node, a 2nd site of malignancy is not excluded. 3. No MRI evidence of malignancy, left breast. 4. No pathologic lymphadenopathy. RECOMMENDATION: MRI guided core needle biopsy of the mass in the upper outer quadrant of the right breast at middle depth. BI-RADS CATEGORY  4: Suspicious. Electronically Signed   By: Evangeline Dakin M.D.   On: 12/25/2015 09:58   Dg Chest Port 1 View  Result Date: 12/29/2015 CLINICAL DATA:  Port placement. EXAM: PORTABLE CHEST 1 VIEW COMPARISON:  None. FINDINGS: Power port inserted from a right internal jugular approach. Catheter tip is in the SVC 2 cm above the right atrium. No pneumothorax. Heart size is normal. Perihilar lung opacity is present. No pleural fluid. IMPRESSION: Power port well positioned with the tip in the SVC 2 cm above the right atrium. Bilateral perihilar pulmonary opacity. No pneumothorax or hemothorax. Electronically Signed   By: Nelson Chimes M.D.   On: 12/29/2015 09:03   Dg Fluoro Guide Cv Line-no Report  Result Date: 12/29/2015 CLINICAL DATA:  FLOURO GUIDE CV LINE Fluoroscopy was utilized by the requesting physician.  No radiographic interpretation.    Radiology No results found.  Procedures Procedures (including critical care time)  Medications Ordered in ED Medications - No data to display   Initial Impression / Assessment and Plan / ED Course  I have reviewed the triage vital signs and the nursing notes.  Pertinent labs & imaging results that were available during my care of the patient were reviewed by me and considered in my medical decision making (see chart for details).   Ruled out for MI with 2 negative delta troponins. HEART score is 1.  Low risk for MACE.  Follow up with your oncologist.  Have Dr. Jana Hakim order an  outpatient ultrasound to better assess the lesion seen on CT of the chest.  Follow up with cardiology. Call Dr. Jana Hakim about your dropping white blood cell count and stay away from anyone who is ill.  Return for chest pain.  Dyspnea on exertion.  Nausea, arm and neck pain.     Final Clinical Impressions(s) / ED Diagnoses   Final diagnoses:  None   No further pain.   New Prescriptions New Prescriptions   No medications on file  All questions answered to patient's satisfaction. Based on history and exam patient has been appropriately medically screened and emergency conditions excluded. Patient is stable for discharge at this time. Follow up with your PMD for recheck in 2 days and strict return precautions given  I personally performed the services described in this documentation, which was scribed in my presence. The recorded information has been reviewed and is accurate.      Veatrice Kells, MD 01/24/16 [phone]

## 2016-01-26 ENCOUNTER — Other Ambulatory Visit (HOSPITAL_BASED_OUTPATIENT_CLINIC_OR_DEPARTMENT_OTHER): Payer: Managed Care, Other (non HMO)

## 2016-01-26 ENCOUNTER — Ambulatory Visit (HOSPITAL_BASED_OUTPATIENT_CLINIC_OR_DEPARTMENT_OTHER): Payer: Managed Care, Other (non HMO) | Admitting: Oncology

## 2016-01-26 VITALS — BP 106/69 | HR 81 | Temp 98.6°F | Resp 18 | Ht 66.0 in | Wt 207.8 lb

## 2016-01-26 DIAGNOSIS — Z171 Estrogen receptor negative status [ER-]: Secondary | ICD-10-CM

## 2016-01-26 DIAGNOSIS — R5383 Other fatigue: Secondary | ICD-10-CM | POA: Diagnosis not present

## 2016-01-26 DIAGNOSIS — C50411 Malignant neoplasm of upper-outer quadrant of right female breast: Secondary | ICD-10-CM

## 2016-01-26 LAB — CBC WITH DIFFERENTIAL/PLATELET
BASO%: 3 % — ABNORMAL HIGH (ref 0.0–2.0)
Basophils Absolute: 0.1 10*3/uL (ref 0.0–0.1)
EOS ABS: 0 10*3/uL (ref 0.0–0.5)
EOS%: 1.2 % (ref 0.0–7.0)
HCT: 34.2 % — ABNORMAL LOW (ref 34.8–46.6)
HGB: 11.1 g/dL — ABNORMAL LOW (ref 11.6–15.9)
LYMPH%: 25.2 % (ref 14.0–49.7)
MCH: 27.7 pg (ref 25.1–34.0)
MCHC: 32.5 g/dL (ref 31.5–36.0)
MCV: 85.4 fL (ref 79.5–101.0)
MONO#: 0.2 10*3/uL (ref 0.1–0.9)
MONO%: 6.5 % (ref 0.0–14.0)
NEUT#: 1.6 10*3/uL (ref 1.5–6.5)
NEUT%: 64.1 % (ref 38.4–76.8)
Platelets: 350 10*3/uL (ref 145–400)
RBC: 4.01 10*6/uL (ref 3.70–5.45)
RDW: 12.7 % (ref 11.2–14.5)
WBC: 2.5 10*3/uL — AB (ref 3.9–10.3)
lymph#: 0.6 10*3/uL — ABNORMAL LOW (ref 0.9–3.3)

## 2016-01-26 LAB — I-STAT TROPONIN, ED: TROPONIN I, POC: 0.01 ng/mL (ref 0.00–0.08)

## 2016-01-26 LAB — COMPREHENSIVE METABOLIC PANEL
ALT: 77 U/L — ABNORMAL HIGH (ref 0–55)
AST: 35 U/L — ABNORMAL HIGH (ref 5–34)
Albumin: 3.8 g/dL (ref 3.5–5.0)
Alkaline Phosphatase: 53 U/L (ref 40–150)
Anion Gap: 10 mEq/L (ref 3–11)
BUN: 12.7 mg/dL (ref 7.0–26.0)
CO2: 28 meq/L (ref 22–29)
Calcium: 9.5 mg/dL (ref 8.4–10.4)
Chloride: 102 mEq/L (ref 98–109)
Creatinine: 0.8 mg/dL (ref 0.6–1.1)
GLUCOSE: 85 mg/dL (ref 70–140)
POTASSIUM: 3.3 meq/L — AB (ref 3.5–5.1)
SODIUM: 140 meq/L (ref 136–145)
Total Bilirubin: 0.45 mg/dL (ref 0.20–1.20)
Total Protein: 7.5 g/dL (ref 6.4–8.3)

## 2016-01-26 MED ORDER — DEXAMETHASONE 4 MG PO TABS: ORAL_TABLET | ORAL | 0 refills | Status: DC

## 2016-01-26 MED ORDER — OMEPRAZOLE 40 MG PO CPDR: 40.0000 mg | DELAYED_RELEASE_CAPSULE | Freq: Every day | ORAL | 1 refills | Status: DC

## 2016-01-26 NOTE — Progress Notes (Signed)
[place] [facility]  [phone]) [phone] [phone]     ID: Lindsay [name] DOB: June 24, 1965  MR#: [id number]  CNO#:[id number]  Patient Care Team: Bernerd Limbo, MD as PCP - General (Family Medicine) Rolm Bookbinder, MD as Consulting Physician (General Surgery) Chauncey Cruel, MD as Consulting Physician (Oncology) Gery Pray, MD as Consulting Physician (Radiation Oncology) Newton Pigg, MD as Consulting Physician (Obstetrics and Gynecology) Dorna Leitz, MD as Consulting Physician (Orthopedic Surgery) OTHER MD:  CHIEF COMPLAINT: Invasive breast cancer  CURRENT TREATMENT: Neoadjuvant chemotherapy   BREAST CANCER HISTORY: From the original intake note:  Lindsay [name] had routine screening mammography with tomography at [facility] 12/04/2015. There was a new mass measuring 1.5 cm at the 11:00 position of the right breast. Right breast ultrasonography 12/10/2015 confirmed a 1.9 cm irregular mass in the upper-outer quadrant of the right breast posteriorly. There was a right axillary lymph node with focal cortical thickening.  On 12/14/2015 Lindsay [name] underwent biopsy of the right breast mass at 9:00 position as well as a suspicious lymph node. A separate area in the right breast 10 cm from the nipple was also biopsied biopsied. The final pathology ([id number]) showed the additional area and the lymph node to be benign. The 9:00 mass however was positive for invasive ductal carcinoma, grade 3, estrogen receptor and progesterone receptor negative, with an MIB-1 of 70%, and HER-2 nonamplified with a signals ratio 1.44, and the number per cell 2.70.  Her subsequent history is as detailed below  INTERVAL HISTORY: Lindsay [name] returns today for follow-up of her triple negative breast cancer accompanied by her husband [name]. Today is day 9 cycle 2 of 4 planned cycles of cyclophosphamide and doxorubicin given every 2 weeks, to be followed by weekly paclitaxel 12  REVIEW OF SYSTEMS: Lindsay [name]  developed epigastric/chest pressure and this took her to the emergency room 2 days ago, where they obtained a CT of the chest which showed no evidence of pulmonary embolus. There was no evidence of bony metastatic disease. It was felt this was most likely reflux problems, but she was not started on PPI. A second problem is that she was very fatigued days 3 through 6 of her second cycle. She spent 3 days pretty much in bed. She wasn't getting out of the house at all. Her husband had to help her with some activities of daily living. Aside from these issues she tolerated her treatment well, with no nausea or vomiting problems, no unusual headaches, no visual changes. She is developed some darkened streaks over her tongue, and some hyperpigmentation over her hands. A detailed review of systems today was otherwise stable  PAST MEDICAL HISTORY: Past Medical History:  Diagnosis Date  . Allergy    seasonal  . Anemia    prior to hysterectomy  . Arthritis   . Breast cancer of upper-outer quadrant of right female breast ([place]) 12/16/2015  . Depression    mild depression after death of daughter- no meds  . Hot flashes   . Hx of adenomatous colonic polyps 04/25/2014  . Hypertension     PAST SURGICAL HISTORY: Past Surgical History:  Procedure Laterality Date  . ABDOMINAL HYSTERECTOMY    . CHOLECYSTECTOMY N/A 09/09/2015   Procedure: LAPAROSCOPIC CHOLECYSTECTOMY;  Surgeon: Ralene Ok, MD;  Location: [facility];  Service: General;  Laterality: N/A;  . COLONOSCOPY    . KNEE ARTHROSCOPY  2016  . PORTACATH PLACEMENT Right 12/29/2015   Procedure: INSERTION PORT-A-CATH WITH ULTRASOUND GUIDANCE;  Surgeon: Rolm Bookbinder, MD;  Location:  [facility] OR;  Service: General;  Laterality: Right;    FAMILY HISTORY Family History  Problem Relation Age of Onset  . Hypertension Mother   . Breast cancer Maternal Grandfather   . Breast cancer Other   . Breast cancer Cousin   . Sudden death Neg Hx   . Diabetes Neg Hx   .  Heart attack Neg Hx   . Hyperlipidemia Neg Hx   . Colon cancer Neg Hx   . Esophageal cancer Neg Hx   . Rectal cancer Neg Hx   . Stomach cancer Neg Hx   The patient's father was murdered at age 48. The patient's mother is 25 years old as of August 2017. The patient has one brother, 2 sisters. A cousin was diagnosed with left breast cancer at age 40. A maternal aunt and a maternal grandmother were also diagnosed with breast cancer at age 69 and 6 respectively.  GYNECOLOGIC HISTORY:  No LMP recorded. Patient has had a hysterectomy.  Menarche age 16, first live birth age 89, the patient is GX P3. She is status post hysterectomy without salpingo-oophorectomy. She did not take hormone replacement. She used oral contraceptives remotely without complications.  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 Her husband [name] is a news and record Lexicographer. Son [name] lives in [place] and is a Games developer. Son [name] lives in [place] and is a Architectural technologist. The patient had a daughter who died at the age of 22.    ADVANCED DIRECTIVES: Not in place   HEALTH MAINTENANCE: Social History  Substance Use Topics  . Smoking status: Never Smoker  . Smokeless tobacco: Never Used  . Alcohol use No     Colonoscopy: 2016  PAP:  Bone density: Never   Allergies  Allergen Reactions  . Percocet [Oxycodone-Acetaminophen] Diarrhea and Nausea And Vomiting  . Tramadol Nausea And Vomiting  . Vicodin [Hydrocodone-Acetaminophen] Nausea And Vomiting  . Pollen Extract Other (See Comments)    Runny nose, itchy eyes and sneezing due to seasonal allergies.    Current Outpatient Prescriptions  Medication Sig Dispense Refill  . dexamethasone (DECADRON) 4 MG tablet Take one tablet with breakfast day 3 and 4 of chemo cycle, take 1/2 tablet with breakfast days 5 and 6 of cycle 20 tablet 0  . EPINEPHrine (EPIPEN 2-PAK) 0.3 mg/0.3 mL IJ SOAJ injection Inject 0.3 mg  into the muscle once as needed (anaphylaxis allergic reaction).     Marland Kitchen ibuprofen (ADVIL,MOTRIN) 400 MG tablet Take 1 tablet (400 mg total) by mouth every 6 (six) hours as needed. (Patient not taking: Reported on 01/24/2016) 30 tablet 0  . lidocaine-prilocaine (EMLA) cream Apply to affected area once 30 g 3  . lisinopril-hydrochlorothiazide (PRINZIDE,ZESTORETIC) 20-12.5 MG per tablet Take 1 tablet by mouth daily.    Marland Kitchen LORazepam (ATIVAN) 0.5 MG tablet Take 1 tablet (0.5 mg total) by mouth at bedtime as needed (Nausea or vomiting). 30 tablet 0  . meloxicam (MOBIC) 15 MG tablet Take 1 tablet (15 mg total) by mouth daily as needed for pain. 30 tablet 0  . omeprazole (PRILOSEC) 40 MG capsule Take 1 capsule (40 mg total) by mouth daily. 90 capsule 1  . potassium chloride (KLOR-CON 10) 10 MEQ tablet Take 1 tablet (10 mEq total) by mouth daily. 90 tablet 3  . prochlorperazine (COMPAZINE) 10 MG tablet Take 1 tablet (10 mg total) by mouth every 6 (six) hours as needed (Nausea or vomiting). [place]  tablet 1   No current facility-administered medications for this visit.     OBJECTIVE: Middle-aged African-American woman In no acute distress Vitals:   01/26/16 1104  BP: 106/69  Pulse: 81  Resp: 18  Temp: 98.6 F (37 C)     Body mass index is 33.54 kg/m.    ECOG FS:1 - Symptomatic but completely ambulatory  Sclerae unicteric, pupils round and equal Oropharynx clear and moist-- no thrush or other lesions No cervical or supraclavicular adenopathy Lungs no rales or rhonchi Heart regular rate and rhythm Abd soft, nontender, positive bowel sounds MSK no focal spinal tenderness, no upper extremity lymphedema Neuro: nonfocal, well oriented, appropriate affect Breasts: Deferred   LAB RESULTS:  CMP     Component Value Date/Time   NA 138 01/24/2016 0346   NA 141 01/18/2016 0928   K 3.7 01/24/2016 0346   K 4.1 01/18/2016 0928   CL 103 01/24/2016 0346   CO2 24 01/24/2016 0327   CO2 29 01/18/2016 0928    GLUCOSE 103 (H) 01/24/2016 0346   GLUCOSE 96 01/18/2016 0928   BUN 19 01/24/2016 0346   BUN 14.6 01/18/2016 0928   CREATININE 0.80 01/24/2016 0346   CREATININE 0.9 01/18/2016 0928   CALCIUM 9.8 01/24/2016 0327   CALCIUM 9.8 01/18/2016 0928   PROT 7.9 01/24/2016 0327   PROT 7.2 01/18/2016 0928   ALBUMIN 4.5 01/24/2016 0327   ALBUMIN 3.6 01/18/2016 0928   AST 35 01/24/2016 0327   AST 21 01/18/2016 0928   ALT 60 (H) 01/24/2016 0327   ALT 28 01/18/2016 0928   ALKPHOS 45 01/24/2016 0327   ALKPHOS 52 01/18/2016 0928   BILITOT 1.3 (H) 01/24/2016 0327   BILITOT 0.30 01/18/2016 0928   GFRNONAA >60 01/24/2016 0327   GFRAA >60 01/24/2016 0327    INo results found for: SPEP, UPEP  Lab Results  Component Value Date   WBC 2.5 (L) 01/26/2016   NEUTROABS 1.6 01/26/2016   HGB 11.1 (L) 01/26/2016   HCT 34.2 (L) 01/26/2016   MCV 85.4 01/26/2016   PLT 350 01/26/2016      Chemistry      Component Value Date/Time   NA 138 01/24/2016 0346   NA 141 01/18/2016 0928   K 3.7 01/24/2016 0346   K 4.1 01/18/2016 0928   CL 103 01/24/2016 0346   CO2 24 01/24/2016 0327   CO2 29 01/18/2016 0928   BUN 19 01/24/2016 0346   BUN 14.6 01/18/2016 0928   CREATININE 0.80 01/24/2016 0346   CREATININE 0.9 01/18/2016 0928      Component Value Date/Time   CALCIUM 9.8 01/24/2016 0327   CALCIUM 9.8 01/18/2016 0928   ALKPHOS 45 01/24/2016 0327   ALKPHOS 52 01/18/2016 0928   AST 35 01/24/2016 0327   AST 21 01/18/2016 0928   ALT 60 (H) 01/24/2016 0327   ALT 28 01/18/2016 0928   BILITOT 1.3 (H) 01/24/2016 0327   BILITOT 0.30 01/18/2016 0928       No results found for: LABCA2  No components found for: LABCA125  No results for input(s): INR in the last 168 hours.  Urinalysis    Component Value Date/Time   COLORURINE YELLOW 07/24/2015 1730   APPEARANCEUR CLEAR 07/24/2015 1730   LABSPEC 1.015 07/24/2015 1730   PHURINE 6.5 07/24/2015 1730   GLUCOSEU NEGATIVE 07/24/2015 1730   HGBUR NEGATIVE  07/24/2015 1730   BILIRUBINUR NEGATIVE 07/24/2015 [place] 07/24/2015 1730   PROTEINUR NEGATIVE 07/24/2015 1730  UROBILINOGEN 1.0 03/03/2015 1240   NITRITE NEGATIVE 07/24/2015 1730   LEUKOCYTESUR NEGATIVE 07/24/2015 1730     STUDIES: Dg Chest 2 View  Result Date: 01/24/2016 CLINICAL DATA:  Mid chest pain, onset tonight. Current chemotherapy for breast cancer. EXAM: CHEST  2 VIEW COMPARISON:  Chest radiograph 12/29/2015 FINDINGS: Tip of the right chest port in the SVC. The cardiomediastinal contours are normal. Previous perihilar opacities are resolved. Pulmonary vasculature is normal. No consolidation, pleural effusion, or pneumothorax. No acute osseous abnormalities are seen. IMPRESSION: No active cardiopulmonary disease. Previous perihilar opacities have resolved. Electronically Signed   By: Rubye Oaks M.D.   On: 01/24/2016 03:57   Ct Angio Chest Pe W And/or Wo Contrast  Result Date: 01/24/2016 CLINICAL DATA:  Substernal chest pain. Recent diagnosis of breast cancer. EXAM: CT ANGIOGRAPHY CHEST WITH CONTRAST TECHNIQUE: Multidetector CT imaging of the chest was performed using the standard protocol during bolus administration of intravenous contrast. Multiplanar CT image reconstructions and MIPs were obtained to evaluate the vascular anatomy. CONTRAST:  100 cc Isovue 370 IV COMPARISON:  Chest radiograph earlier this day FINDINGS: Cardiovascular: Satisfactory opacification of the pulmonary arteries to the segmental level. No evidence of pulmonary embolism. Normal heart size. No pericardial effusion. Mediastinum/Nodes: Heterogeneous enlargement of the left lobe of the thyroid, with possible 2 cm nodule. No mediastinal adenopathy. No mediastinal mass. The esophagus is decompressed. Right chest port remains in place. Clip in the right breast. No definite axillary adenopathy. Lungs/Pleura: No consolidation, pulmonary nodule, or pulmonary edema. No pleural fluid. Upper Abdomen: No  acute abnormality. Musculoskeletal: There are no acute or suspicious osseous abnormalities. Degenerative changes in the spine with endplate spurring. Review of the MIP images confirms the above findings. IMPRESSION: 1. No pulmonary embolus or acute intrathoracic process. 2. Enlarged left lobe of the thyroid with possible ill-defined 2 cm nodule. Recommend nonemergent thyroid ultrasound for characterization. Electronically Signed   By: Rubye Oaks M.D.   On: 01/24/2016 05:52   Dg Chest Port 1 View  Result Date: 12/29/2015 CLINICAL DATA:  Port placement. EXAM: PORTABLE CHEST 1 VIEW COMPARISON:  None. FINDINGS: Power port inserted from a right internal jugular approach. Catheter tip is in the SVC 2 cm above the right atrium. No pneumothorax. Heart size is normal. Perihilar lung opacity is present. No pleural fluid. IMPRESSION: Power port well positioned with the tip in the SVC 2 cm above the right atrium. Bilateral perihilar pulmonary opacity. No pneumothorax or hemothorax. Electronically Signed   By: Paulina Fusi M.D.   On: 12/29/2015 09:03   Dg Fluoro Guide Cv Line-no Report  Result Date: 12/29/2015 CLINICAL DATA:  FLOURO GUIDE CV LINE Fluoroscopy was utilized by the requesting physician.  No radiographic interpretation.    ELIGIBLE FOR AVAILABLE RESEARCH PROTOCOL: PREVENT  ASSESSMENT: 50 y.o. [place] woman status post right breast upper outer quadrant biopsy 12/14/2015 for a clinical T1c N0, stage IA  invasive ductal carcinoma, grade 3, triple negative, with an MIB-1 of 70%  (1) genetics testing pending  (2) neoadjuvant chemotherapy to consist of doxorubicin and cyclophosphamide in dose this fashion 4, followed by paclitaxel weekly 12  (3) breast conserving surgery to follow chemotherapy  (4) adjuvant radiation to follow surgery  (5) considered the PREVENT trial: decided against it  PLAN: Janyah is somewhat better with her second cycle of chemotherapy, but then developed  significant reflux, which is what I really think took her to the emergency room.  At least her CT scan showed not only no pulmonary  embolus but also no evidence of metastatic disease in the lungs or bones.  I am starting her on omeprazole 40 mg to take daily until she completes her chemotherapy. I think that will take care of that issue.  For some reason she was extremely fatigued on days 345 and 6 of cycle 2. We are going to her back to Decadron but only 4 mg with breakfast on days 3 and 4 and then 2 mg with breakfast and days 5 and 6. I think that will take care of that problem.  Hopefully this will make her third cycle better. She will receive that of course next week. She will see me the following week. She knows to call for any problems that may develop before that visit.   :Chauncey Cruel, MD   01/26/2016 11:34 AM Medical Oncology and Hematology [facility] [place], [place] [address] Tel. [phone]    Fax. [phone] you

## 2016-01-28 ENCOUNTER — Telehealth: Payer: Self-pay

## 2016-01-28 NOTE — Telephone Encounter (Signed)
Faxed fmla paperwork to [place] [facility], [place], and [employer] on 01/28/16

## 2016-02-01 ENCOUNTER — Ambulatory Visit (HOSPITAL_BASED_OUTPATIENT_CLINIC_OR_DEPARTMENT_OTHER): Payer: Managed Care, Other (non HMO)

## 2016-02-01 ENCOUNTER — Other Ambulatory Visit (HOSPITAL_BASED_OUTPATIENT_CLINIC_OR_DEPARTMENT_OTHER): Payer: Managed Care, Other (non HMO)

## 2016-02-01 ENCOUNTER — Telehealth: Payer: Self-pay | Admitting: Genetic Counselor

## 2016-02-01 VITALS — BP 108/79 | Temp 98.8°F | Resp 17

## 2016-02-01 DIAGNOSIS — C50411 Malignant neoplasm of upper-outer quadrant of right female breast: Secondary | ICD-10-CM

## 2016-02-01 DIAGNOSIS — Z5111 Encounter for antineoplastic chemotherapy: Secondary | ICD-10-CM

## 2016-02-01 DIAGNOSIS — Z5189 Encounter for other specified aftercare: Secondary | ICD-10-CM | POA: Diagnosis not present

## 2016-02-01 LAB — CBC WITH DIFFERENTIAL/PLATELET
BASO%: 0.5 % (ref 0.0–2.0)
BASOS ABS: 0.1 10*3/uL (ref 0.0–0.1)
EOS%: 0.2 % (ref 0.0–7.0)
Eosinophils Absolute: 0 10*3/uL (ref 0.0–0.5)
HEMATOCRIT: 33.6 % — AB (ref 34.8–46.6)
HGB: 10.9 g/dL — ABNORMAL LOW (ref 11.6–15.9)
LYMPH%: 10.4 % — AB (ref 14.0–49.7)
MCH: 27.8 pg (ref 25.1–34.0)
MCHC: 32.4 g/dL (ref 31.5–36.0)
MCV: 85.7 fL (ref 79.5–101.0)
MONO#: 0.9 10*3/uL (ref 0.1–0.9)
MONO%: 7.5 % (ref 0.0–14.0)
NEUT#: 9.6 10*3/uL — ABNORMAL HIGH (ref 1.5–6.5)
NEUT%: 81.4 % — ABNORMAL HIGH (ref 38.4–76.8)
PLATELETS: 479 10*3/uL — AB (ref 145–400)
RBC: 3.92 10*6/uL (ref 3.70–5.45)
RDW: 12.8 % (ref 11.2–14.5)
WBC: 11.7 10*3/uL — ABNORMAL HIGH (ref 3.9–10.3)
lymph#: 1.2 10*3/uL (ref 0.9–3.3)

## 2016-02-01 LAB — COMPREHENSIVE METABOLIC PANEL
ALBUMIN: 3.9 g/dL (ref 3.5–5.0)
ALK PHOS: 53 U/L (ref 40–150)
ALT: 46 U/L (ref 0–55)
AST: 27 U/L (ref 5–34)
Anion Gap: 11 mEq/L (ref 3–11)
BUN: 11.6 mg/dL (ref 7.0–26.0)
CALCIUM: 10.4 mg/dL (ref 8.4–10.4)
CO2: 28 mEq/L (ref 22–29)
CREATININE: 0.8 mg/dL (ref 0.6–1.1)
Chloride: 104 mEq/L (ref 98–109)
EGFR: >90 mL/min/{1.73_m2} (ref >90)
GLUCOSE: 98 mg/dL (ref 70–140)
Potassium: 4.3 mEq/L (ref 3.5–5.1)
SODIUM: 143 meq/L (ref 136–145)
TOTAL PROTEIN: 7.5 g/dL (ref 6.4–8.3)
Total Bilirubin: 0.32 mg/dL (ref 0.20–1.20)

## 2016-02-01 MED ORDER — HEPARIN SOD (PORK) LOCK FLUSH 100 UNIT/ML IV SOLN
500.0000 [IU] | Freq: Once | INTRAVENOUS | Status: DC | PRN
Start: 2016-02-01 — End: 2016-02-01
  Filled 2016-02-01: qty 5

## 2016-02-01 MED ORDER — PALONOSETRON HCL INJECTION 0.25 MG/5ML
INTRAVENOUS | Status: AC
  Filled 2016-02-01: qty 5

## 2016-02-01 MED ORDER — SODIUM CHLORIDE 0.9 % IV SOLN
Freq: Once | INTRAVENOUS | Status: AC
  Administered 2016-02-01: 10:00:00 via INTRAVENOUS

## 2016-02-01 MED ORDER — SODIUM CHLORIDE 0.9% FLUSH
10.0000 mL | INTRAVENOUS | Status: DC | PRN
  Filled 2016-02-01: qty 10

## 2016-02-01 MED ORDER — DOXORUBICIN HCL CHEMO IV INJECTION 2 MG/ML
60.0000 mg/m2 | Freq: Once | INTRAVENOUS | Status: AC
  Administered 2016-02-01: 128 mg via INTRAVENOUS
  Filled 2016-02-01: qty 64

## 2016-02-01 MED ORDER — PEGFILGRASTIM 6 MG/0.6ML ~~LOC~~ PSKT
6.0000 mg | PREFILLED_SYRINGE | Freq: Once | SUBCUTANEOUS | Status: AC
  Administered 2016-02-01: 6 mg via SUBCUTANEOUS
  Filled 2016-02-01: qty 0.6

## 2016-02-01 MED ORDER — FOSAPREPITANT DIMEGLUMINE INJECTION 150 MG
Freq: Once | INTRAVENOUS | Status: AC
  Administered 2016-02-01: 10:00:00 via INTRAVENOUS
  Filled 2016-02-01: qty 5

## 2016-02-01 MED ORDER — PALONOSETRON HCL INJECTION 0.25 MG/5ML
0.2500 mg | Freq: Once | INTRAVENOUS | Status: AC
Start: 2016-02-01 — End: 2016-02-01
  Administered 2016-02-01: 0.25 mg via INTRAVENOUS

## 2016-02-01 MED ORDER — SODIUM CHLORIDE 0.9 % IV SOLN
600.0000 mg/m2 | Freq: Once | INTRAVENOUS | Status: AC
  Administered 2016-02-01: 1280 mg via INTRAVENOUS
  Filled 2016-02-01: qty 64

## 2016-02-01 NOTE — Telephone Encounter (Signed)
Discussed moving patient's appt until next week, 10/3 at 11 AM before her lab appt and her appt with Dr. Jana Hakim.  This change has been made.

## 2016-02-01 NOTE — Patient Instructions (Signed)
[facility] Discharge Instructions for Patients Receiving Chemotherapy  Today you received the following chemotherapy agents : Adriamycin,  Cytoxan.  To help prevent nausea and vomiting after your treatment, we encourage you to take your nausea medication as prescribed.   If you develop nausea and vomiting that is not controlled by your nausea medication, call the clinic.   BELOW ARE SYMPTOMS THAT SHOULD BE REPORTED IMMEDIATELY:  *FEVER GREATER THAN 100.5 F  *CHILLS WITH OR WITHOUT FEVER  NAUSEA AND VOMITING THAT IS NOT CONTROLLED WITH YOUR NAUSEA MEDICATION  *UNUSUAL SHORTNESS OF BREATH  *UNUSUAL BRUISING OR BLEEDING  TENDERNESS IN MOUTH AND THROAT WITH OR WITHOUT PRESENCE OF ULCERS  *URINARY PROBLEMS  *BOWEL PROBLEMS  UNUSUAL RASH Items with * indicate a potential emergency and should be followed up as soon as possible.  Feel free to call the clinic you have any questions or concerns. The clinic phone number is [phone].  Please show the CHEMO ALERT CARD at check-in to the Emergency Department and triage nurse.   

## 2016-02-02 ENCOUNTER — Other Ambulatory Visit: Payer: Managed Care, Other (non HMO)

## 2016-02-02 ENCOUNTER — Encounter: Payer: Managed Care, Other (non HMO) | Admitting: Genetic Counselor

## 2016-02-09 ENCOUNTER — Ambulatory Visit (HOSPITAL_BASED_OUTPATIENT_CLINIC_OR_DEPARTMENT_OTHER): Payer: Managed Care, Other (non HMO) | Admitting: Oncology

## 2016-02-09 ENCOUNTER — Ambulatory Visit (HOSPITAL_BASED_OUTPATIENT_CLINIC_OR_DEPARTMENT_OTHER): Payer: Managed Care, Other (non HMO) | Admitting: Genetic Counselor

## 2016-02-09 ENCOUNTER — Encounter: Payer: Self-pay | Admitting: *Deleted

## 2016-02-09 ENCOUNTER — Other Ambulatory Visit (HOSPITAL_BASED_OUTPATIENT_CLINIC_OR_DEPARTMENT_OTHER): Payer: Managed Care, Other (non HMO)

## 2016-02-09 VITALS — BP 107/69 | HR 79 | Temp 97.9°F | Resp 18 | Ht 66.0 in | Wt 205.1 lb

## 2016-02-09 DIAGNOSIS — C50919 Malignant neoplasm of unspecified site of unspecified female breast: Secondary | ICD-10-CM

## 2016-02-09 DIAGNOSIS — Z315 Encounter for genetic counseling: Secondary | ICD-10-CM

## 2016-02-09 DIAGNOSIS — C50411 Malignant neoplasm of upper-outer quadrant of right female breast: Secondary | ICD-10-CM

## 2016-02-09 DIAGNOSIS — Z171 Estrogen receptor negative status [ER-]: Secondary | ICD-10-CM

## 2016-02-09 DIAGNOSIS — Z8042 Family history of malignant neoplasm of prostate: Secondary | ICD-10-CM

## 2016-02-09 DIAGNOSIS — Z803 Family history of malignant neoplasm of breast: Secondary | ICD-10-CM

## 2016-02-09 LAB — COMPREHENSIVE METABOLIC PANEL
ALBUMIN: 3.9 g/dL (ref 3.5–5.0)
ALK PHOS: 53 U/L (ref 40–150)
ALT: 80 U/L — ABNORMAL HIGH (ref 0–55)
AST: 33 U/L (ref 5–34)
Anion Gap: 10 mEq/L (ref 3–11)
BILIRUBIN TOTAL: 0.45 mg/dL (ref 0.20–1.20)
BUN: 11.2 mg/dL (ref 7.0–26.0)
CALCIUM: 9.9 mg/dL (ref 8.4–10.4)
CO2: 28 mEq/L (ref 22–29)
Chloride: 102 mEq/L (ref 98–109)
Creatinine: 0.8 mg/dL (ref 0.6–1.1)
GLUCOSE: 95 mg/dL (ref 70–140)
Potassium: 3.7 mEq/L (ref 3.5–5.1)
SODIUM: 139 meq/L (ref 136–145)
TOTAL PROTEIN: 7.4 g/dL (ref 6.4–8.3)

## 2016-02-09 LAB — CBC WITH DIFFERENTIAL/PLATELET
BASO%: 2.4 % — ABNORMAL HIGH (ref 0.0–2.0)
Basophils Absolute: 0.1 10*3/uL (ref 0.0–0.1)
EOS%: 1.5 % (ref 0.0–7.0)
Eosinophils Absolute: 0.1 10*3/uL (ref 0.0–0.5)
HEMATOCRIT: 31.5 % — AB (ref 34.8–46.6)
HEMOGLOBIN: 10.3 g/dL — AB (ref 11.6–15.9)
LYMPH%: 14.7 % (ref 14.0–49.7)
MCH: 27.9 pg (ref 25.1–34.0)
MCHC: 32.8 g/dL (ref 31.5–36.0)
MCV: 85.1 fL (ref 79.5–101.0)
MONO#: 0.3 10*3/uL (ref 0.1–0.9)
MONO%: 9.3 % (ref 0.0–14.0)
NEUT%: 72.1 % (ref 38.4–76.8)
NEUTROS ABS: 2.4 10*3/uL (ref 1.5–6.5)
PLATELETS: 399 10*3/uL (ref 145–400)
RBC: 3.7 10*6/uL (ref 3.70–5.45)
RDW: 12.7 % (ref 11.2–14.5)
WBC: 3.3 10*3/uL — AB (ref 3.9–10.3)
lymph#: 0.5 10*3/uL — ABNORMAL LOW (ref 0.9–3.3)

## 2016-02-09 NOTE — Progress Notes (Signed)
[place]  [phone] [phone]     ID: Lindsay [name] DOB: 09/14/1965  MR#: [id number]  DVV#:[id number]  Patient Care Team: Bernerd Limbo, MD as PCP - General (Family Medicine) Rolm Bookbinder, MD as Consulting Physician (General Surgery) Chauncey Cruel, MD as Consulting Physician (Oncology) Gery Pray, MD as Consulting Physician (Radiation Oncology) Newton Pigg, MD as Consulting Physician (Obstetrics and Gynecology) Dorna Leitz, MD as Consulting Physician (Orthopedic Surgery) OTHER MD:  CHIEF COMPLAINT: Invasive breast cancer  CURRENT TREATMENT: Neoadjuvant chemotherapy   BREAST CANCER HISTORY: From the original intake note:  Lindsay [name] had routine screening mammography with tomography at [facility] 12/04/2015. There was a new mass measuring 1.5 cm at the 11:00 position of the right breast. Right breast ultrasonography 12/10/2015 confirmed a 1.9 cm irregular mass in the upper-outer quadrant of the right breast posteriorly. There was a right axillary lymph node with focal cortical thickening.  On 12/14/2015 Lindsay [name] underwent biopsy of the right breast mass at 9:00 position as well as a suspicious lymph node. A separate area in the right breast 10 cm from the nipple was also biopsied biopsied. The final pathology ([id number]) showed the additional area and the lymph node to be benign. The 9:00 mass however was positive for invasive ductal carcinoma, grade 3, estrogen receptor and progesterone receptor negative, with an MIB-1 of 70%, and HER-2 nonamplified with a signals ratio 1.44, and the number per cell 2.70.  Her subsequent history is as detailed below  INTERVAL HISTORY: Lindsay [name] returns today for follow-up of her breast cancer accompanied by her husband [name] [place]. Today is day 9 cycle 3 of 4 planned cycles of cyclophosphamide and doxorubicin given every 2 weeks, to be followed by weekly paclitaxel 12  REVIEW OF SYSTEMS: Lindsay [name] did "terrible" with her  third cycle. She couldn't get out of bed for 2 or 3 days. She had problems with nausea. She vomited twice, once on day 2 and once on day 4.. She had no mouth sores. She had a slight headache. She had photophobia for a while and had to watch the TV with the lights off. She has been very irritable. Her bowel movements have been loose. Everything tastes terrible. She is having more hot flashes than before. There have been no fever, bleeding, or rash. The port has worked well. A detailed review of systems today was otherwise stable.  PAST MEDICAL HISTORY: Past Medical History:  Diagnosis Date  . Allergy    seasonal  . Anemia    prior to hysterectomy  . Arthritis   . Breast cancer of upper-outer quadrant of right female breast ([place]) 12/16/2015  . Depression    mild depression after death of daughter- no meds  . Hot flashes   . Hx of adenomatous colonic polyps 04/25/2014  . Hypertension     PAST SURGICAL HISTORY: Past Surgical History:  Procedure Laterality Date  . ABDOMINAL HYSTERECTOMY    . CHOLECYSTECTOMY N/A 09/09/2015   Procedure: LAPAROSCOPIC CHOLECYSTECTOMY;  Surgeon: Ralene Ok, MD;  Location: [facility];  Service: General;  Laterality: N/A;  . COLONOSCOPY    . KNEE ARTHROSCOPY  2016  . PORTACATH PLACEMENT Right 12/29/2015   Procedure: INSERTION PORT-A-CATH WITH ULTRASOUND GUIDANCE;  Surgeon: Rolm Bookbinder, MD;  Location: [place];  Service: General;  Laterality: Right;    FAMILY HISTORY Family History  Problem Relation Age of Onset  . Hypertension Mother   . Breast cancer Maternal Grandfather   . Breast cancer Other   . Breast  cancer Cousin   . Sudden death Neg Hx   . Diabetes Neg Hx   . Heart attack Neg Hx   . Hyperlipidemia Neg Hx   . Colon cancer Neg Hx   . Esophageal cancer Neg Hx   . Rectal cancer Neg Hx   . Stomach cancer Neg Hx   The patient's father was murdered at age 69. The patient's mother is 2 years old as of August 2017. The patient has one brother, 2  sisters. A cousin was diagnosed with left breast cancer at age 43. A maternal aunt and a maternal grandmother were also diagnosed with breast cancer at age 7 and 28 respectively.  GYNECOLOGIC HISTORY:  No LMP recorded. Patient has had a hysterectomy.  Menarche age 59, first live birth age 36, the patient is GX P3. She is status post hysterectomy without salpingo-oophorectomy. She did not take hormone replacement. She used oral contraceptives remotely without complications.  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 Her husband [name]  is a news and record Lexicographer. Son [name] lives in [place] and is a Games developer. Son [name] lives in [place] and is a Architectural technologist. The patient had a daughter who died at the age of 19.    ADVANCED DIRECTIVES: Not in place   HEALTH MAINTENANCE: Social History  Substance Use Topics  . Smoking status: Never Smoker  . Smokeless tobacco: Never Used  . Alcohol use No     Colonoscopy: 2016  PAP:  Bone density: Never   Allergies  Allergen Reactions  . Percocet [Oxycodone-Acetaminophen] Diarrhea and Nausea And Vomiting  . Tramadol Nausea And Vomiting  . Vicodin [Hydrocodone-Acetaminophen] Nausea And Vomiting  . Pollen Extract Other (See Comments)    Runny nose, itchy eyes and sneezing due to seasonal allergies.    Current Outpatient Prescriptions  Medication Sig Dispense Refill  . dexamethasone (DECADRON) 4 MG tablet Take one tablet with breakfast day 3 and 4 of chemo cycle, take 1/2 tablet with breakfast days 5 and 6 of cycle 20 tablet 0  . EPINEPHrine (EPIPEN 2-PAK) 0.3 mg/0.3 mL IJ SOAJ injection Inject 0.3 mg into the muscle once as needed (anaphylaxis allergic reaction).     Marland Kitchen ibuprofen (ADVIL,MOTRIN) 400 MG tablet Take 1 tablet (400 mg total) by mouth every 6 (six) hours as needed. (Patient not taking: Reported on 01/24/2016) 30 tablet 0  . lidocaine-prilocaine (EMLA) cream Apply to  affected area once 30 g 3  . lisinopril-hydrochlorothiazide (PRINZIDE,ZESTORETIC) 20-12.5 MG per tablet Take 1 tablet by mouth daily.    Marland Kitchen LORazepam (ATIVAN) 0.5 MG tablet Take 1 tablet (0.5 mg total) by mouth at bedtime as needed (Nausea or vomiting). 30 tablet 0  . meloxicam (MOBIC) 15 MG tablet Take 1 tablet (15 mg total) by mouth daily as needed for pain. 30 tablet 0  . omeprazole (PRILOSEC) 40 MG capsule Take 1 capsule (40 mg total) by mouth daily. 90 capsule 1  . potassium chloride (KLOR-CON 10) 10 MEQ tablet Take 1 tablet (10 mEq total) by mouth daily. 90 tablet 3  . prochlorperazine (COMPAZINE) 10 MG tablet Take 1 tablet (10 mg total) by mouth every 6 (six) hours as needed (Nausea or vomiting). 30 tablet 1   No current facility-administered medications for this visit.     OBJECTIVE: Middle-aged African-American woman Who appears stated age 50:   02/09/16 1217  BP: 107/69  Pulse: 79  Resp: 18  Temp: 97.9 F (36.6  C)     Body mass index is 33.1 kg/m.    ECOG FS:1 - Symptomatic but completely ambulatory  Sclerae unicteric, EOMs intact Oropharynx clear and moist No cervical or supraclavicular adenopathy Lungs no rales or rhonchi Heart regular rate and rhythm Abd soft, nontender, positive bowel sounds MSK no focal spinal tenderness, no upper extremity lymphedema Neuro: nonfocal, well oriented, appropriate affect Breasts: Deferred   LAB RESULTS:  CMP     Component Value Date/Time   NA 139 02/09/2016 1150   K 3.7 02/09/2016 1150   CL 103 01/24/2016 0346   CO2 28 02/09/2016 1150   GLUCOSE 95 02/09/2016 1150   BUN 11.2 02/09/2016 1150   CREATININE 0.8 02/09/2016 1150   CALCIUM 9.9 02/09/2016 1150   PROT 7.4 02/09/2016 1150   ALBUMIN 3.9 02/09/2016 1150   AST 33 02/09/2016 1150   ALT 80 (H) 02/09/2016 1150   ALKPHOS 53 02/09/2016 1150   BILITOT 0.45 02/09/2016 1150   GFRNONAA >60 01/24/2016 0327   GFRAA >60 01/24/2016 0327    INo results found for: SPEP,  UPEP  Lab Results  Component Value Date   WBC 3.3 (L) 02/09/2016   NEUTROABS 2.4 02/09/2016   HGB 10.3 (L) 02/09/2016   HCT 31.5 (L) 02/09/2016   MCV 85.1 02/09/2016   PLT 399 02/09/2016      Chemistry      Component Value Date/Time   NA 139 02/09/2016 1150   K 3.7 02/09/2016 1150   CL 103 01/24/2016 0346   CO2 28 02/09/2016 1150   BUN 11.2 02/09/2016 1150   CREATININE 0.8 02/09/2016 1150      Component Value Date/Time   CALCIUM 9.9 02/09/2016 1150   ALKPHOS 53 02/09/2016 1150   AST 33 02/09/2016 1150   ALT 80 (H) 02/09/2016 1150   BILITOT 0.45 02/09/2016 1150       No results found for: LABCA2  No components found for: LABCA125  No results for input(s): INR in the last 168 hours.  Urinalysis    Component Value Date/Time   COLORURINE YELLOW 07/24/2015 1730   APPEARANCEUR CLEAR 07/24/2015 1730   LABSPEC 1.015 07/24/2015 1730   PHURINE 6.5 07/24/2015 1730   GLUCOSEU NEGATIVE 07/24/2015 1730   HGBUR NEGATIVE 07/24/2015 1730   BILIRUBINUR NEGATIVE 07/24/2015 1730   KETONESUR NEGATIVE 07/24/2015 1730   PROTEINUR NEGATIVE 07/24/2015 1730   UROBILINOGEN 1.0 03/03/2015 1240   NITRITE NEGATIVE 07/24/2015 1730   LEUKOCYTESUR NEGATIVE 07/24/2015 1730     STUDIES: Dg Chest 2 View  Result Date: 01/24/2016 CLINICAL DATA:  Mid chest pain, onset tonight. Current chemotherapy for breast cancer. EXAM: CHEST  2 VIEW COMPARISON:  Chest radiograph 12/29/2015 FINDINGS: Tip of the right chest port in the SVC. The cardiomediastinal contours are normal. Previous perihilar opacities are resolved. Pulmonary vasculature is normal. No consolidation, pleural effusion, or pneumothorax. No acute osseous abnormalities are seen. IMPRESSION: No active cardiopulmonary disease. Previous perihilar opacities have resolved. Electronically Signed   By: Jeb Levering M.D.   On: 01/24/2016 03:57   Ct Angio Chest Pe W And/or Wo Contrast  Result Date: 01/24/2016 CLINICAL DATA:  Substernal chest  pain. Recent diagnosis of breast cancer. EXAM: CT ANGIOGRAPHY CHEST WITH CONTRAST TECHNIQUE: Multidetector CT imaging of the chest was performed using the standard protocol during bolus administration of intravenous contrast. Multiplanar CT image reconstructions and MIPs were obtained to evaluate the vascular anatomy. CONTRAST:  100 cc Isovue 370 IV COMPARISON:  Chest radiograph earlier this day FINDINGS:  Cardiovascular: Satisfactory opacification of the pulmonary arteries to the segmental level. No evidence of pulmonary embolism. Normal heart size. No pericardial effusion. Mediastinum/Nodes: Heterogeneous enlargement of the left lobe of the thyroid, with possible 2 cm nodule. No mediastinal adenopathy. No mediastinal mass. The esophagus is decompressed. Right chest port remains in place. Clip in the right breast. No definite axillary adenopathy. Lungs/Pleura: No consolidation, pulmonary nodule, or pulmonary edema. No pleural fluid. Upper Abdomen: No acute abnormality. Musculoskeletal: There are no acute or suspicious osseous abnormalities. Degenerative changes in the spine with endplate spurring. Review of the MIP images confirms the above findings. IMPRESSION: 1. No pulmonary embolus or acute intrathoracic process. 2. Enlarged left lobe of the thyroid with possible ill-defined 2 cm nodule. Recommend nonemergent thyroid ultrasound for characterization. Electronically Signed   By: Jeb Levering M.D.   On: 01/24/2016 05:52    ELIGIBLE FOR AVAILABLE RESEARCH PROTOCOL: PREVENT  ASSESSMENT: 50 y.o. [place] woman status post right breast upper outer quadrant biopsy 12/14/2015 for a clinical T1c N0, stage IA  invasive ductal carcinoma, grade 3, triple negative, with an MIB-1 of 70%  (1) genetics testing pending  (2) neoadjuvant chemotherapy to consist of doxorubicin and cyclophosphamide in dose this fashion 4, followed by paclitaxel weekly 12  (a) cycle 4 of cyclophosphamide and doxorubicin held 1 week  because of side effects  (3) breast conserving surgery to follow chemotherapy  (4) adjuvant radiation to follow surgery  (5) considered the PREVENT trial: decided against it  PLAN: Lindsay [name] is pretty much "beat up" by her current chemotherapy, and I don't think she is going to do well if we proceed to cycle 4 next week as originally planned. I think she will do much better if we move it back a week.  I also think she will benefit if we add some fluids on day 4 or so to help her get through the most difficult week which is the first week.  She was very agreeable to these changes. Accordingly her next treatment will not be next week but 2 weeks from now. She will have IVF 2 days and 4 days after that treatment. At the October 1627 14 visit I will set her up for her paclitaxel treatments 2 weeks later.  She knows to call for any other issues that may develop before her next visit here.  :Chauncey Cruel, MD   02/09/2016 6:51 PM Medical Oncology and Hematology [facility] [place], [place] [address] Tel. [phone]    Fax. [phone] you

## 2016-02-10 DIAGNOSIS — Z803 Family history of malignant neoplasm of breast: Secondary | ICD-10-CM | POA: Insufficient documentation

## 2016-02-10 NOTE — Progress Notes (Signed)
REFERRING PROVIDER: Lurline Del, MD  PRIMARY PROVIDER:  Phineas Inches, MD  PRIMARY REASON FOR VISIT:  1. Malignant neoplasm of upper-outer quadrant of right breast in female, estrogen receptor negative ([place])   2. Triple negative malignant neoplasm of breast ([place])   3. Family history of breast cancer in female   66. Family history of prostate cancer      HISTORY OF PRESENT ILLNESS:   Lindsay [name], a 50 y.o. female, was seen for a [place] cancer genetics consultation at the request of Dr. Jana Hakim due to a personal history of triple negative breast cancer at 28 and family history of breast cancer.  Lindsay [name] presents to clinic today with her husband to discuss the possibility of a hereditary predisposition to cancer, genetic testing, and to further clarify her future cancer risks, as well as potential cancer risks for family members.   In August 2017, at the age of 56, Lindsay [name] was diagnosed with invasive ductal carcinoma of the right breast.  Hormone receptor status was triple negative. This is being treated with neoadjuvant chemotherapy.  Genetic testing will help inform surgical/treatment decisions.  Lindsay [name] reports no additional personal history of cancer.  HORMONAL RISK FACTORS:  Menarche was at age 49.  First live birth at age 66.  OCP use for approximately 0 years.  Ovaries intact: yes.  Hysterectomy: yes, in 2010 (age 57) for fibroids.  Menopausal status: had a hysterectomy.  HRT use: 0 years. Colonoscopy: yes; first colonoscopy in December 2015 found 4 tubular adenomas and 1 hyperplastic polyp; due for next colonoscopy in 2020. Mammogram within the last year: yes. Number of breast biopsies: 1. Up to date with pelvic exams: due this year, plans to schedule after she is finished dealing with cancer treatment. Any excessive radiation exposure/other exposures in the past:  no  Past Medical History:  Diagnosis Date  . Allergy    seasonal  . Anemia    prior to  hysterectomy  . Arthritis   . Breast cancer of upper-outer quadrant of right female breast ([place]) 12/16/2015  . Depression    mild depression after death of daughter- no meds  . Hot flashes   . Hx of adenomatous colonic polyps 04/25/2014  . Hypertension     Past Surgical History:  Procedure Laterality Date  . ABDOMINAL HYSTERECTOMY    . CHOLECYSTECTOMY N/A 09/09/2015   Procedure: LAPAROSCOPIC CHOLECYSTECTOMY;  Surgeon: Ralene Ok, MD;  Location: [facility];  Service: General;  Laterality: N/A;  . COLONOSCOPY    . KNEE ARTHROSCOPY  2016  . PORTACATH PLACEMENT Right 12/29/2015   Procedure: INSERTION PORT-A-CATH WITH ULTRASOUND GUIDANCE;  Surgeon: Rolm Bookbinder, MD;  Location: [facility];  Service: General;  Laterality: Right;    Social History   Social History  . Marital status: Married    Spouse name: N/A  . Number of children: N/A  . Years of education: N/A   Social History Main Topics  . Smoking status: Never Smoker  . Smokeless tobacco: Never Used  . Alcohol use No  . Drug use: No  . Sexual activity: Not on file   Other Topics Concern  . Not on file   Social History Narrative  . No narrative on file     FAMILY HISTORY:  We obtained a detailed, 4-generation family history.  Significant diagnoses are listed below: Family History  Problem Relation Age of Onset  . Hypertension Mother   . Breast cancer Maternal Grandfather   . Breast cancer Other   .  Breast cancer Cousin   . Sudden death Neg Hx   . Diabetes Neg Hx   . Heart attack Neg Hx   . Hyperlipidemia Neg Hx   . Colon cancer Neg Hx   . Esophageal cancer Neg Hx   . Rectal cancer Neg Hx   . Stomach cancer Neg Hx     Lindsay [name] has one son from a previous partnership, Lindsay [name], who is currently 44 and cancer-free.  Lindsay [name] has one son and one daughter of his own.  Lindsay [name]. Tamas has another son and a daughter.  Her daughter, Lindsay [name], passed away from a brain aneurysm at 22.  She had no children.  Lindsay [name]. Bureau younger  son, Lindsay [name], is currently 58 and is cancer-free.  He has no children currently.  Lindsay [name] has one full brother and sister, ages 42 and 69.  Her brother was diagnosed with "prostate cancer" at 15 and he had a "testicle removed" in treatment for this cancer.  This brother has one son who is cancer-free.  Lindsay [name] full sister has never had cancer and she has one son and one daughter who are also cancer-free.  Lindsay [name] also has one maternal half-sister who is currently 46 and who has never had cancer.  This half-sister has one daughter who "lost an ovary" (sounds like she had an ovary surgically removed) after she received a Gardasil shot.    Lindsay [name] mother is currently in her early 31s and has never been diagnosed with cancer.  She has two full brothers and five full sisters, most of whom are still living and have not had cancer.  One brother passed away from an unspecified cause.  One sister was diagnosed with breast cancer at 58.  She has one son who is unaffected.  Another aunt had two sons and two daughters, and one of her daughters was diagnosed with breast cancer at 46.  Lindsay [name] reports no additional known cancer for other maternal first cousins.  Her maternal grandmother was diagnosed with breast cancer at 26, and she passed away at 70-90.  Lindsay [name] has no information for her maternal grandfather, maternal great grandparents, or for any of her maternal great aunts/uncles.  Lindsay [name] father was murdered in his 77s.  He had four full sisters and three full brothers, most of whom are still living in their 85s-70s and have never been diagnosed with cancer.  One of his sisters passed away at a young age from an unspecified cause.  Lindsay [name] also reports no known cancer for any of her paternal first cousins.  Her paternal grandmother passed away at 74 and never had cancer.  Her grandfather passed away at an unspecified age, but he did not have cancer to Lindsay [name]. Tol[place]'s knowledge.  Lindsay [name]. Link has no  information for any of her paternal great aunts/uncles and great grandparents.  Lindsay [name]. Auman is unaware of any previous family history of genetic testing for hereditary cancer risks.  Patient's maternal and paternal ancestors are of African American descent. There is no reported Ashkenazi Jewish ancestry. There is no known consanguinity.  GENETIC COUNSELING ASSESSMENT: KAITLINN IVERSEN is a 50 y.o. female with a personal and family history of breast cancer which is somewhat suggestive of a hereditary breast cancer syndrome and predisposition to cancer. We, therefore, discussed and recommended the following at today's visit.   DISCUSSION: We reviewed the characteristics, features and inheritance patterns of hereditary cancer syndromes, particularly those caused by mutations within the BRCA1/2 genes.  We also discussed genetic testing, including the appropriate family members to test, the process of testing, insurance coverage and turn-around-time for results. We discussed the implications of a negative, positive and/or variant of uncertain significant result. We recommended Lindsay [name]. Eveleth pursue genetic testing for the 20-gene Breast/Ovarian Cancer Panel through [facility].  The Breast/Ovarian Cancer Panel offered by [facility] Hope Pigeon, MD) includes sequencing and deletion/duplication analysis for the following 19 genes:  ATM, BARD1, BRCA1, BRCA2, BRIP1, CDH1, CHEK2, FANCC, MLH1, MSH2, MSH6, NBN, PALB2, PMS2, PTEN, RAD51C, RAD51D, TP53, and XRCC2.  This panel also includes deletion/duplication analysis (without sequencing) for one gene, EPCAM.   Based on Lindsay [name]. Paulette's personal and family history of cancer, she meets medical criteria for genetic testing. Despite that she meets criteria, she may still have an out of pocket cost. We discussed that if her out of pocket cost for testing is over $100, the laboratory will call and confirm whether she wants to proceed with testing.  If the out of pocket  cost of testing is less than $100 she will be billed by the genetic testing laboratory.   PLAN: After considering the risks, benefits, and limitations, Lindsay [name]. Delduca  provided informed consent to pursue genetic testing and the blood sample was sent to [facility] for analysis of the 20-gene Breast/Ovarian Cancer Panel. Results should be available within approximately 2-3 weeks' time, at which point they will be disclosed by telephone to Lindsay [name]. Laurance Flatten, as will any additional recommendations warranted by these results. Lindsay [name]. Somoza will receive a summary of her genetic counseling visit and a copy of her results once available. This information will also be available in Epic. We encouraged Lindsay [name]. Brault to remain in contact with cancer genetics annually so that we can continuously update the family history and inform her of any changes in cancer genetics and testing that may be of benefit for her family. Lindsay [name]. Charnley questions were answered to her satisfaction today. Our contact information was provided should additional questions or concerns arise.  Thank you for the referral and allowing [place] to share in the care of your patient.   Jeanine Luz, Lindsay [name], [facility] Certified Genetic Counselor [email or web address] Phone: [phone]  The patient was seen for a total of 45 minutes in face-to-face genetic counseling.  This patient was discussed with Drs. Magrinat, Lindi Adie and/or Burr Medico who agrees with the above.    _______________________________________________________________________ For Office Staff:  Number of people involved in session: 2 Was an Intern/ student involved with case: no

## 2016-02-15 ENCOUNTER — Other Ambulatory Visit: Payer: Managed Care, Other (non HMO)

## 2016-02-15 ENCOUNTER — Ambulatory Visit: Payer: Managed Care, Other (non HMO)

## 2016-02-18 ENCOUNTER — Telehealth: Payer: Self-pay | Admitting: *Deleted

## 2016-02-18 NOTE — Telephone Encounter (Signed)
  Oncology Nurse Navigator Documentation  Navigator Location: [facility] (02/18/16 1000) Navigator Encounter Type: Telephone (Return pt call. Request return call. ) (02/18/16 1000) Telephone: Outgoing Call (Contact information provided.) (02/18/16 1000)                                        Time Spent with Patient: 15 (02/18/16 1000)

## 2016-02-21 NOTE — Progress Notes (Signed)
[name]  [phone] [phone]     ID: Lindsay [name] DOB: 1965/10/03  MR#: [id number]  TGG#:[id number]  Patient Care Team: Bernerd Limbo, MD as PCP - General (Family Medicine) Rolm Bookbinder, MD as Consulting Physician (General Surgery) Chauncey Cruel, MD as Consulting Physician (Oncology) Gery Pray, MD as Consulting Physician (Radiation Oncology) Newton Pigg, MD as Consulting Physician (Obstetrics and Gynecology) Dorna Leitz, MD as Consulting Physician (Orthopedic Surgery) OTHER MD:  CHIEF COMPLAINT: Invasive breast cancer  CURRENT TREATMENT: Neoadjuvant chemotherapy   BREAST CANCER HISTORY: From the original intake note:  Lindsay [name] had routine screening mammography with tomography at [facility] 12/04/2015. There was a new mass measuring 1.5 cm at the 11:00 position of the right breast. Right breast ultrasonography 12/10/2015 confirmed a 1.9 cm irregular mass in the upper-outer quadrant of the right breast posteriorly. There was a right axillary lymph node with focal cortical thickening.  On 12/14/2015 Lindsay [name] underwent biopsy of the right breast mass at 9:00 position as well as a suspicious lymph node. A separate area in the right breast 10 cm from the nipple was also biopsied biopsied. The final pathology ([id number]) showed the additional area and the lymph node to be benign. The 9:00 mass however was positive for invasive ductal carcinoma, grade 3, estrogen receptor and progesterone receptor negative, with an MIB-1 of 70%, and HER-2 nonamplified with a signals ratio 1.44, and the number per cell 2.70.  Her subsequent history is as detailed below  INTERVAL HISTORY: Lindsay [name] returns today for follow-up of her triple negative breast cancer accompanied by her husband Lindsay [name] and her sister Lindsay [name]. Today is day 1 cycle 4 of 4 planned cycles of cyclophosphamide and doxorubicin given every 2 weeks for the first 3 cycles but with 1 weeks delay this  final cycle. This will be followed by weekly paclitaxel 12  REVIEW OF SYSTEMS: Jazmon "had a good week". She was able to be [name] active, took photos with her family, and is eating normally. She has had no unusual headaches, mouth sores, nausea, vomiting, cough, phlegm production, or change in bowel or bladder habits. A detailed review of systems today was otherwise stable.  PAST MEDICAL HISTORY: Past Medical History:  Diagnosis Date  . Allergy    seasonal  . Anemia    prior to hysterectomy  . Arthritis   . Breast cancer of upper-outer quadrant of right female breast ([place]) 12/16/2015  . Depression    mild depression after death of daughter- no meds  . Hot flashes   . Hx of adenomatous colonic polyps 04/25/2014  . Hypertension     PAST SURGICAL HISTORY: Past Surgical History:  Procedure Laterality Date  . ABDOMINAL HYSTERECTOMY    . CHOLECYSTECTOMY N/A 09/09/2015   Procedure: LAPAROSCOPIC CHOLECYSTECTOMY;  Surgeon: Ralene Ok, MD;  Location: [facility];  Service: General;  Laterality: N/A;  . COLONOSCOPY    . KNEE ARTHROSCOPY  2016  . PORTACATH PLACEMENT Right 12/29/2015   Procedure: INSERTION PORT-A-CATH WITH ULTRASOUND GUIDANCE;  Surgeon: Rolm Bookbinder, MD;  Location: [facility];  Service: General;  Laterality: Right;    FAMILY HISTORY Family History  Problem Relation Age of Onset  . Hypertension Mother   . Breast cancer Maternal Grandfather   . Breast cancer Other   . Breast cancer Cousin   . Sudden death Neg Hx   . Diabetes Neg Hx   . Heart attack Neg Hx   . Hyperlipidemia Neg Hx   . Colon cancer Neg Hx   .  Esophageal cancer Neg Hx   . Rectal cancer Neg Hx   . Stomach cancer Neg Hx   The patient's father was murdered at age 21. The patient's mother is 13 years old as of August 2017. The patient has one brother, 2 sisters. A cousin was diagnosed with left breast cancer at age 68. A maternal aunt and a maternal grandmother were also diagnosed with breast cancer at age 31  and 58 respectively.  GYNECOLOGIC HISTORY:  No LMP recorded. Patient has had a hysterectomy.  Menarche age 6, first live birth age 34, the patient is GX P3. She is status post hysterectomy without salpingo-oophorectomy. She did not take hormone replacement. She used oral contraceptives remotely without complications.  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 Her husband Lindsay [name] is a news and record Lexicographer. Son Lindsay [name] lives in [place] and is a Games developer. Son Lindsay [name] lives in [place] and is a Architectural technologist. The patient had a daughter who died at the age of 60.    ADVANCED DIRECTIVES: Not in place   HEALTH MAINTENANCE: Social History  Substance Use Topics  . Smoking status: Never Smoker  . Smokeless tobacco: Never Used  . Alcohol use No     Colonoscopy: 2016  PAP:  Bone density: Never   Allergies  Allergen Reactions  . Percocet [Oxycodone-Acetaminophen] Diarrhea and Nausea And Vomiting  . Tramadol Nausea And Vomiting  . Vicodin [Hydrocodone-Acetaminophen] Nausea And Vomiting  . Pollen Extract Other (See Comments)    Runny nose, itchy eyes and sneezing due to seasonal allergies.    Current Outpatient Prescriptions  Medication Sig Dispense Refill  . dexamethasone (DECADRON) 4 MG tablet Take one tablet with breakfast day 3 and 4 of chemo cycle, take 1/2 tablet with breakfast days 5 and 6 of cycle 20 tablet 0  . EPINEPHrine (EPIPEN 2-PAK) 0.3 mg/0.3 mL IJ SOAJ injection Inject 0.3 mg into the muscle once as needed (anaphylaxis allergic reaction).     Marland Kitchen ibuprofen (ADVIL,MOTRIN) 400 MG tablet Take 1 tablet (400 mg total) by mouth every 6 (six) hours as needed. (Patient not taking: Reported on 01/24/2016) 30 tablet 0  . lidocaine-prilocaine (EMLA) cream Apply to affected area once 30 g 3  . lisinopril-hydrochlorothiazide (PRINZIDE,ZESTORETIC) 20-12.5 MG per tablet Take 1 tablet by mouth daily.    Marland Kitchen LORazepam (ATIVAN) 0.5 MG  tablet Take 1 tablet (0.5 mg total) by mouth at bedtime as needed (Nausea or vomiting). 30 tablet 0  . meloxicam (MOBIC) 15 MG tablet Take 1 tablet (15 mg total) by mouth daily as needed for pain. 30 tablet 0  . omeprazole (PRILOSEC) 40 MG capsule Take 1 capsule (40 mg total) by mouth daily. 90 capsule 1  . potassium chloride (KLOR-CON 10) 10 MEQ tablet Take 1 tablet (10 mEq total) by mouth daily. 90 tablet 3  . prochlorperazine (COMPAZINE) 10 MG tablet Take 1 tablet (10 mg total) by mouth every 6 (six) hours as needed (Nausea or vomiting). 30 tablet 1   No current facility-administered medications for this visit.     OBJECTIVE: Middle-aged African-American woman In no acute distress Vitals:   02/22/16 0826  BP: 134/84  Pulse: 84  Resp: 18  Temp: 97.9 F (36.6 C)     Body mass index is 33.77 kg/m.    ECOG FS:0 - Asymptomatic  Sclerae unicteric, pupils round and equal Oropharynx clear and moist-- no thrush or other lesions No cervical or supraclavicular  adenopathy Lungs no rales or rhonchi Heart regular rate and rhythm Abd soft, nontender, positive bowel sounds MSK no focal spinal tenderness, no upper extremity lymphedema Neuro: nonfocal, well oriented, appropriate affect Breasts: I do not palpate any mass in the right breast, and there are no skin or nipple changes of concern. The right axilla is benign. The left breast is unremarkable.    LAB RESULTS:  CMP     Component Value Date/Time   NA 142 02/22/2016 0801   K 3.8 02/22/2016 0801   CL 103 01/24/2016 0346   CO2 27 02/22/2016 0801   GLUCOSE 96 02/22/2016 0801   BUN 12.5 02/22/2016 0801   CREATININE 0.8 02/22/2016 0801   CALCIUM 10.0 02/22/2016 0801   PROT 7.4 02/22/2016 0801   ALBUMIN 3.9 02/22/2016 0801   AST 31 02/22/2016 0801   ALT 37 02/22/2016 0801   ALKPHOS 42 02/22/2016 0801   BILITOT 0.46 02/22/2016 0801   GFRNONAA >60 01/24/2016 0327   GFRAA >60 01/24/2016 0327    INo results found for: SPEP,  UPEP  Lab Results  Component Value Date   WBC 5.7 02/22/2016   NEUTROABS 3.9 02/22/2016   HGB 10.6 (L) 02/22/2016   HCT 32.6 (L) 02/22/2016   MCV 88.1 02/22/2016   PLT 279 02/22/2016      Chemistry      Component Value Date/Time   NA 142 02/22/2016 0801   K 3.8 02/22/2016 0801   CL 103 01/24/2016 0346   CO2 27 02/22/2016 0801   BUN 12.5 02/22/2016 0801   CREATININE 0.8 02/22/2016 0801      Component Value Date/Time   CALCIUM 10.0 02/22/2016 0801   ALKPHOS 42 02/22/2016 0801   AST 31 02/22/2016 0801   ALT 37 02/22/2016 0801   BILITOT 0.46 02/22/2016 0801       No results found for: LABCA2  No components found for: LABCA125  No results for input(s): INR in the last 168 hours.  Urinalysis    Component Value Date/Time   COLORURINE YELLOW 07/24/2015 1730   APPEARANCEUR CLEAR 07/24/2015 1730   LABSPEC 1.015 07/24/2015 1730   PHURINE 6.5 07/24/2015 1730   GLUCOSEU NEGATIVE 07/24/2015 1730   HGBUR NEGATIVE 07/24/2015 1730   BILIRUBINUR NEGATIVE 07/24/2015 1730   KETONESUR NEGATIVE 07/24/2015 1730   PROTEINUR NEGATIVE 07/24/2015 1730   UROBILINOGEN 1.0 03/03/2015 1240   NITRITE NEGATIVE 07/24/2015 1730   LEUKOCYTESUR NEGATIVE 07/24/2015 1730     STUDIES: Dg Chest 2 View  Result Date: 01/24/2016 CLINICAL DATA:  Mid chest pain, onset tonight. Current chemotherapy for breast cancer. EXAM: CHEST  2 VIEW COMPARISON:  Chest radiograph 12/29/2015 FINDINGS: Tip of the right chest port in the SVC. The cardiomediastinal contours are normal. Previous perihilar opacities are resolved. Pulmonary vasculature is normal. No consolidation, pleural effusion, or pneumothorax. No acute osseous abnormalities are seen. IMPRESSION: No active cardiopulmonary disease. Previous perihilar opacities have resolved. Electronically Signed   By: Jeb Levering M.D.   On: 01/24/2016 03:57   Ct Angio Chest Pe W And/or Wo Contrast  Result Date: 01/24/2016 CLINICAL DATA:  Substernal chest pain.  Recent diagnosis of breast cancer. EXAM: CT ANGIOGRAPHY CHEST WITH CONTRAST TECHNIQUE: Multidetector CT imaging of the chest was performed using the standard protocol during bolus administration of intravenous contrast. Multiplanar CT image reconstructions and MIPs were obtained to evaluate the vascular anatomy. CONTRAST:  100 cc Isovue 370 IV COMPARISON:  Chest radiograph earlier this day FINDINGS: Cardiovascular: Satisfactory opacification of the pulmonary arteries  to the segmental level. No evidence of pulmonary embolism. Normal heart size. No pericardial effusion. Mediastinum/Nodes: Heterogeneous enlargement of the left lobe of the thyroid, with possible 2 cm nodule. No mediastinal adenopathy. No mediastinal mass. The esophagus is decompressed. Right chest port remains in place. Clip in the right breast. No definite axillary adenopathy. Lungs/Pleura: No consolidation, pulmonary nodule, or pulmonary edema. No pleural fluid. Upper Abdomen: No acute abnormality. Musculoskeletal: There are no acute or suspicious osseous abnormalities. Degenerative changes in the spine with endplate spurring. Review of the MIP images confirms the above findings. IMPRESSION: 1. No pulmonary embolus or acute intrathoracic process. 2. Enlarged left lobe of the thyroid with possible ill-defined 2 cm nodule. Recommend nonemergent thyroid ultrasound for characterization. Electronically Signed   By: Jeb Levering M.D.   On: 01/24/2016 05:52    ELIGIBLE FOR AVAILABLE RESEARCH PROTOCOL: PREVENT  ASSESSMENT: 50 y.o. [place] woman status post right breast upper outer quadrant biopsy 12/14/2015 for a clinical T1c N0, stage IA  invasive ductal carcinoma, grade 3, triple negative, with an MIB-1 of 70%  (1) genetics testing pending  (2) neoadjuvant chemotherapy to consist of doxorubicin and cyclophosphamide in dose this fashion 4, completed 02/22/2016 followed by paclitaxel weekly 12  (a) cycle 4 of cyclophosphamide and  doxorubicin held 1 week because of side effects  (3) breast conserving surgery to follow chemotherapy  (4) adjuvant radiation to follow surgery  (5) considered the PREVENT trial: decided against it  PLAN: Levonne had a good week last week and is ready to complete her doxorubicin and cyclophosphamide treatments today. We are going to support her with intravenous fluids Wednesday and Friday of this week. She will omit dose if she does not find them necessary.  She will then return to see me 03/08/2016. At that time we will start the weekly paclitaxel, which she will receive 12.  I am very encouraged that I cannot palpate any abnormality in the right breast at this time.  Her husband requested a note for his work, with I was glad to write for him.  Lindsay [name] knows to call for any problems that may develop before her return visit here  :Chauncey Cruel, MD   02/22/2016 8:43 AM Medical Oncology and Hematology [facility] [place], [place] [address] Tel. [phone]    Fax. [phone] you

## 2016-02-22 ENCOUNTER — Other Ambulatory Visit (HOSPITAL_BASED_OUTPATIENT_CLINIC_OR_DEPARTMENT_OTHER): Payer: Managed Care, Other (non HMO)

## 2016-02-22 ENCOUNTER — Ambulatory Visit (HOSPITAL_BASED_OUTPATIENT_CLINIC_OR_DEPARTMENT_OTHER): Payer: Managed Care, Other (non HMO) | Admitting: Oncology

## 2016-02-22 ENCOUNTER — Encounter: Payer: Self-pay | Admitting: *Deleted

## 2016-02-22 ENCOUNTER — Ambulatory Visit (HOSPITAL_BASED_OUTPATIENT_CLINIC_OR_DEPARTMENT_OTHER): Payer: Managed Care, Other (non HMO)

## 2016-02-22 ENCOUNTER — Other Ambulatory Visit: Payer: Self-pay | Admitting: Oncology

## 2016-02-22 VITALS — BP 134/84 | HR 84 | Temp 97.9°F | Resp 18 | Ht 66.0 in | Wt 209.2 lb

## 2016-02-22 DIAGNOSIS — Z171 Estrogen receptor negative status [ER-]: Principal | ICD-10-CM

## 2016-02-22 DIAGNOSIS — Z5189 Encounter for other specified aftercare: Secondary | ICD-10-CM

## 2016-02-22 DIAGNOSIS — Z5111 Encounter for antineoplastic chemotherapy: Secondary | ICD-10-CM

## 2016-02-22 DIAGNOSIS — C50411 Malignant neoplasm of upper-outer quadrant of right female breast: Secondary | ICD-10-CM

## 2016-02-22 LAB — CBC WITH DIFFERENTIAL/PLATELET
BASO%: 0.4 % (ref 0.0–2.0)
Basophils Absolute: 0 10*3/uL (ref 0.0–0.1)
EOS ABS: 0 10*3/uL (ref 0.0–0.5)
EOS%: 0.2 % (ref 0.0–7.0)
HEMATOCRIT: 32.6 % — AB (ref 34.8–46.6)
HGB: 10.6 g/dL — ABNORMAL LOW (ref 11.6–15.9)
LYMPH#: 0.9 10*3/uL (ref 0.9–3.3)
LYMPH%: 16.6 % (ref 14.0–49.7)
MCH: 28.6 pg (ref 25.1–34.0)
MCHC: 32.5 g/dL (ref 31.5–36.0)
MCV: 88.1 fL (ref 79.5–101.0)
MONO#: 0.7 10*3/uL (ref 0.1–0.9)
MONO%: 13.1 % (ref 0.0–14.0)
NEUT%: 69.7 % (ref 38.4–76.8)
NEUTROS ABS: 3.9 10*3/uL (ref 1.5–6.5)
PLATELETS: 279 10*3/uL (ref 145–400)
RBC: 3.7 10*6/uL (ref 3.70–5.45)
RDW: 14.8 % — ABNORMAL HIGH (ref 11.2–14.5)
WBC: 5.7 10*3/uL (ref 3.9–10.3)

## 2016-02-22 LAB — COMPREHENSIVE METABOLIC PANEL
ALK PHOS: 42 U/L (ref 40–150)
ALT: 37 U/L (ref 0–55)
ANION GAP: 11 meq/L (ref 3–11)
AST: 31 U/L (ref 5–34)
Albumin: 3.9 g/dL (ref 3.5–5.0)
BILIRUBIN TOTAL: 0.46 mg/dL (ref 0.20–1.20)
BUN: 12.5 mg/dL (ref 7.0–26.0)
CALCIUM: 10 mg/dL (ref 8.4–10.4)
CO2: 27 mEq/L (ref 22–29)
Chloride: 103 mEq/L (ref 98–109)
Creatinine: 0.8 mg/dL (ref 0.6–1.1)
EGFR: >90 mL/min/{1.73_m2} (ref >90)
Glucose: 96 mg/dl (ref 70–140)
Potassium: 3.8 mEq/L (ref 3.5–5.1)
Sodium: 142 mEq/L (ref 136–145)
TOTAL PROTEIN: 7.4 g/dL (ref 6.4–8.3)

## 2016-02-22 MED ORDER — PALONOSETRON HCL INJECTION 0.25 MG/5ML
0.2500 mg | Freq: Once | INTRAVENOUS | Status: AC
  Administered 2016-02-22: 0.25 mg via INTRAVENOUS

## 2016-02-22 MED ORDER — SODIUM CHLORIDE 0.9 % IV SOLN
Freq: Once | INTRAVENOUS | Status: AC
  Administered 2016-02-22: 09:00:00 via INTRAVENOUS

## 2016-02-22 MED ORDER — CYCLOPHOSPHAMIDE CHEMO INJECTION 1 GM
600.0000 mg/m2 | Freq: Once | INTRAMUSCULAR | Status: AC
  Administered 2016-02-22: 1280 mg via INTRAVENOUS
  Filled 2016-02-22: qty 64

## 2016-02-22 MED ORDER — HEPARIN SOD (PORK) LOCK FLUSH 100 UNIT/ML IV SOLN
500.0000 [IU] | Freq: Once | INTRAVENOUS | Status: AC | PRN
  Administered 2016-02-22: 500 [IU]
  Filled 2016-02-22: qty 5

## 2016-02-22 MED ORDER — PALONOSETRON HCL INJECTION 0.25 MG/5ML
INTRAVENOUS | Status: AC
  Filled 2016-02-22: qty 5

## 2016-02-22 MED ORDER — PEGFILGRASTIM 6 MG/0.6ML ~~LOC~~ PSKT
6.0000 mg | PREFILLED_SYRINGE | Freq: Once | SUBCUTANEOUS | Status: AC
  Administered 2016-02-22: 6 mg via SUBCUTANEOUS
  Filled 2016-02-22: qty 0.6

## 2016-02-22 MED ORDER — FOSAPREPITANT DIMEGLUMINE INJECTION 150 MG
Freq: Once | INTRAVENOUS | Status: AC
  Administered 2016-02-22: 10:00:00 via INTRAVENOUS
  Filled 2016-02-22: qty 5

## 2016-02-22 MED ORDER — DOXORUBICIN HCL CHEMO IV INJECTION 2 MG/ML
60.0000 mg/m2 | Freq: Once | INTRAVENOUS | Status: AC
  Administered 2016-02-22: 128 mg via INTRAVENOUS
  Filled 2016-02-22: qty 64

## 2016-02-22 MED ORDER — SODIUM CHLORIDE 0.9% FLUSH
10.0000 mL | INTRAVENOUS | Status: DC | PRN
  Administered 2016-02-22: 10 mL
  Filled 2016-02-22: qty 10

## 2016-02-22 NOTE — Progress Notes (Signed)
ON PATHWAY REGIMEN - Breast  No Change  Continue With Treatment as Ordered.  [id number]: Dose-Dense AC-T - Doxorubicin, Cyclophosphamide q14 Days x 4 Cycles, Followed by Paclitaxel q14 Days x 4 Cycles  Dose Dense AC-T q14days Part 1 (cycles 1-4):   A cycle is every 14 days:     Doxorubicin (Adriamycin(R)) 60 mg/m2 IV push Dose Mod: None     Cyclophosphamide (Cytoxan(R)) 600 mg/m2 in 250 mL NS IV over 30 minutes Dose Mod: None     Pegfilgrastim (Neulasta(R)) 6 mg flat dose Gordon once day 2.  G-CSF recommended due to data showing a >20% risk of febrile neutropenia. Dose Mod: None Additional Orders: Cycle = q14 days 4 cycles of AC are followed by 4 cycles of paclitaxel (Dose Dense AC-T part 2)  **Always confirm dose/schedule in your pharmacy ordering system**    Dose Dense AC-T q14days Part 2 (cycles 5-8):   A cycle is every 14 days:     Paclitaxel (Taxol(R)) 175 mg/m2 in 500 mL NS IV over 3 hours (in glass or non-PVC bag) Dose Mod: None     Pegfilgrastim (Neulasta(R)) 6 mg flat dose  once day 2.  G-CSF recommended due to data showing a >20% risk of febrile neutropenia. Dose Mod: None Additional Orders: Cycle = q14 days 4 cycles of paclitaxel are preceded by 4 cycles of AC (Dose Dense AC-T part 1)  **Always confirm dose/schedule in your pharmacy ordering system**    Patient Characteristics: Neoadjuvant Chemotherapy, HER2/neu Negative/Unknown/Equivocal AJCC Stage Grouping: IA Current Disease Status: No Distant Mets or Local Recurrence AJCC M Stage: X ER Status: Negative (-) AJCC N Stage: X AJCC T Stage: X HER2/neu: Negative (-) PR Status: Negative (-)  Intent of Therapy: Curative Intent, Discussed with Patient

## 2016-02-22 NOTE — Patient Instructions (Signed)
[facility] Discharge Instructions for Patients Receiving Chemotherapy  Today you received the following chemotherapy agents Adriamycin and Cytoxan.  To help prevent nausea and vomiting after your treatment, we encourage you to take your nausea medication as prescribed.   If you develop nausea and vomiting that is not controlled by your nausea medication, call the clinic.   BELOW ARE SYMPTOMS THAT SHOULD BE REPORTED IMMEDIATELY:  *FEVER GREATER THAN 100.5 F  *CHILLS WITH OR WITHOUT FEVER  NAUSEA AND VOMITING THAT IS NOT CONTROLLED WITH YOUR NAUSEA MEDICATION  *UNUSUAL SHORTNESS OF BREATH  *UNUSUAL BRUISING OR BLEEDING  TENDERNESS IN MOUTH AND THROAT WITH OR WITHOUT PRESENCE OF ULCERS  *URINARY PROBLEMS  *BOWEL PROBLEMS  UNUSUAL RASH Items with * indicate a potential emergency and should be followed up as soon as possible.  Feel free to call the clinic you have any questions or concerns. The clinic phone number is [phone].  Please show the CHEMO ALERT CARD at check-in to the Emergency Department and triage nurse.   

## 2016-02-24 ENCOUNTER — Ambulatory Visit (HOSPITAL_BASED_OUTPATIENT_CLINIC_OR_DEPARTMENT_OTHER): Payer: Managed Care, Other (non HMO)

## 2016-02-24 ENCOUNTER — Telehealth: Payer: Self-pay | Admitting: *Deleted

## 2016-02-24 ENCOUNTER — Telehealth: Payer: Self-pay

## 2016-02-24 VITALS — BP 114/68 | HR 94 | Temp 97.7°F | Resp 18

## 2016-02-24 DIAGNOSIS — Z5189 Encounter for other specified aftercare: Secondary | ICD-10-CM

## 2016-02-24 DIAGNOSIS — C50411 Malignant neoplasm of upper-outer quadrant of right female breast: Secondary | ICD-10-CM | POA: Diagnosis not present

## 2016-02-24 DIAGNOSIS — Z171 Estrogen receptor negative status [ER-]: Secondary | ICD-10-CM

## 2016-02-24 DIAGNOSIS — Z95828 Presence of other vascular implants and grafts: Secondary | ICD-10-CM

## 2016-02-24 MED ORDER — SODIUM CHLORIDE 0.9% FLUSH
10.0000 mL | INTRAVENOUS | Status: DC | PRN
  Administered 2016-02-24: 10 mL via INTRAVENOUS
  Filled 2016-02-24: qty 10

## 2016-02-24 MED ORDER — SODIUM CHLORIDE 0.9 % IV SOLN
Freq: Once | INTRAVENOUS | Status: AC
  Administered 2016-02-24: 13:00:00 via INTRAVENOUS

## 2016-02-24 MED ORDER — HEPARIN SOD (PORK) LOCK FLUSH 100 UNIT/ML IV SOLN
500.0000 [IU] | Freq: Once | INTRAVENOUS | Status: AC
  Administered 2016-02-24: 500 [IU] via INTRAVENOUS
  Filled 2016-02-24: qty 5

## 2016-02-24 NOTE — Patient Instructions (Signed)

## 2016-02-24 NOTE — Telephone Encounter (Signed)
Pt husband "[name] Sigourney" reached out to state pt "really weak and doesn't want to come for fluids". Informed [name] Liberty that it was very important for pt to come for IVF. Denies fever or vomiting. Relate pt did have some nausea. Informed husband she will receive IVF and anti-nausea medications.  Husband relate he will bring pt for IVF.

## 2016-02-24 NOTE — Telephone Encounter (Signed)
Faxed office notes to [employer] disability

## 2016-02-25 ENCOUNTER — Telehealth: Payer: Self-pay

## 2016-02-25 NOTE — Telephone Encounter (Signed)
Faxed claim form to [employer].

## 2016-02-26 ENCOUNTER — Ambulatory Visit (HOSPITAL_BASED_OUTPATIENT_CLINIC_OR_DEPARTMENT_OTHER): Payer: Managed Care, Other (non HMO)

## 2016-02-26 ENCOUNTER — Encounter: Payer: Self-pay | Admitting: *Deleted

## 2016-02-26 VITALS — BP 119/73 | HR 83 | Temp 97.9°F | Resp 17

## 2016-02-26 DIAGNOSIS — C50411 Malignant neoplasm of upper-outer quadrant of right female breast: Secondary | ICD-10-CM | POA: Diagnosis not present

## 2016-02-26 DIAGNOSIS — Z171 Estrogen receptor negative status [ER-]: Principal | ICD-10-CM

## 2016-02-26 DIAGNOSIS — Z5189 Encounter for other specified aftercare: Secondary | ICD-10-CM | POA: Diagnosis not present

## 2016-02-26 MED ORDER — SODIUM CHLORIDE 0.9 % IV SOLN
Freq: Once | INTRAVENOUS | Status: AC
  Administered 2016-02-26: 11:00:00 via INTRAVENOUS

## 2016-02-26 MED ORDER — HEPARIN SOD (PORK) LOCK FLUSH 100 UNIT/ML IV SOLN
500.0000 [IU] | Freq: Once | INTRAVENOUS | Status: AC | PRN
  Administered 2016-02-26: 500 [IU]
  Filled 2016-02-26: qty 5

## 2016-02-26 MED ORDER — SODIUM CHLORIDE 0.9 % IJ SOLN
10.0000 mL | INTRAMUSCULAR | Status: DC | PRN
  Filled 2016-02-26: qty 10

## 2016-02-26 MED ORDER — SODIUM CHLORIDE 0.9 % IV SOLN
Freq: Once | INTRAVENOUS | Status: AC
  Administered 2016-02-26: 11:00:00 via INTRAVENOUS
  Filled 2016-02-26: qty 4

## 2016-02-26 NOTE — Patient Instructions (Signed)
Dehydration, Adult Dehydration is a condition in which you do not have enough fluid or water in your body. It happens when you take in less fluid than you lose. Vital organs such as the kidneys, brain, and heart cannot function without a proper amount of fluids. Any loss of fluids from the body can cause dehydration.  Dehydration can range from mild to severe. This condition should be treated right away to help prevent it from becoming severe. CAUSES  This condition may be caused by:  Vomiting.  Diarrhea.  Excessive sweating, such as when exercising in hot or humid weather.  Not drinking enough fluid during strenuous exercise or during an illness.  Excessive urine output.  Fever.  Certain medicines. RISK FACTORS This condition is more likely to develop in:  People who are taking certain medicines that cause the body to lose excess fluid (diuretics).   People who have a chronic illness, such as diabetes, that may increase urination.  Older adults.   People who live at high altitudes.   People who participate in endurance sports.  SYMPTOMS  Mild Dehydration  Thirst.  Dry lips.  Slightly dry mouth.  Dry, warm skin. Moderate Dehydration  Very dry mouth.   Muscle cramps.   Dark urine and decreased urine production.   Decreased tear production.   Headache.   Light-headedness, especially when you stand up from a sitting position.  Severe Dehydration  Changes in skin.   Cold and clammy skin.   Skin does not spring back quickly when lightly pinched and released.   Changes in body fluids.   Extreme thirst.   No tears.   Not able to sweat when body temperature is high, such as in hot weather.   Minimal urine production.   Changes in vital signs.   Rapid, weak pulse (more than 100 beats per minute when you are sitting still).   Rapid breathing.   Low blood pressure.   Other changes.   Sunken eyes.   Cold hands and feet.    Confusion.  Lethargy and difficulty being awakened.  Fainting (syncope).   Short-term weight loss.   Unconsciousness. DIAGNOSIS  This condition may be diagnosed based on your symptoms. You may also have tests to determine how severe your dehydration is. These tests may include:   Urine tests.   Blood tests.  TREATMENT  Treatment for this condition depends on the severity. Mild or moderate dehydration can often be treated at home. Treatment should be started right away. Do not wait until dehydration becomes severe. Severe dehydration needs to be treated at the hospital. Treatment for Mild Dehydration  Drinking plenty of water to replace the fluid you have lost.   Replacing minerals in your blood (electrolytes) that you may have lost.  Treatment for Moderate Dehydration  Consuming oral rehydration solution (ORS). Treatment for Severe Dehydration  Receiving fluid through an IV tube.   Receiving electrolyte solution through a feeding tube that is passed through your nose and into your stomach (nasogastric tube or NG tube).  Correcting any abnormalities in electrolytes. HOME CARE INSTRUCTIONS   Drink enough fluid to keep your urine clear or pale yellow.   Drink water or fluid slowly by taking small sips. You can also try sucking on ice cubes.  Have food or beverages that contain electrolytes. Examples include bananas and sports drinks.  Take over-the-counter and prescription medicines only as told by your health care provider.   Prepare ORS according to the manufacturer's instructions. Take sips   of ORS every 5 minutes until your urine returns to normal.  If you have vomiting or diarrhea, continue to try to drink water, ORS, or both.   If you have diarrhea, avoid:   Beverages that contain caffeine.   Fruit juice.   Milk.   Carbonated soft drinks.  Do not take salt tablets. This can lead to the condition of having too much sodium in your body  (hypernatremia).  SEEK MEDICAL CARE IF:  You cannot eat or drink without vomiting.  You have had moderate diarrhea during a period of more than 24 hours.  You have a fever. SEEK IMMEDIATE MEDICAL CARE IF:   You have extreme thirst.  You have severe diarrhea.  You have not urinated in 6-8 hours, or you have urinated only a small amount of very dark urine.  You have shriveled skin.  You are dizzy, confused, or both.   This information is not intended to replace advice given to you by your health care provider. Make sure you discuss any questions you have with your health care provider.   Document Released: 04/25/2005 Document Revised: 01/14/2015 Document Reviewed: 09/10/2014 Elsevier Interactive Patient Education 2016 [employer] Inc.    Nausea and Vomiting Nausea is a sick feeling that often comes before throwing up (vomiting). Vomiting is a reflex where stomach contents come out of your mouth. Vomiting can cause severe loss of body fluids (dehydration). Children and elderly adults can become dehydrated quickly, especially if they also have diarrhea. Nausea and vomiting are symptoms of a condition or disease. It is important to find the cause of your symptoms. CAUSES   Direct irritation of the stomach lining. This irritation can result from increased acid production (gastroesophageal reflux disease), infection, food poisoning, taking certain medicines (such as nonsteroidal anti-inflammatory drugs), alcohol use, or tobacco use.  Signals from the brain.These signals could be caused by a headache, heat exposure, an inner ear disturbance, increased pressure in the brain from injury, infection, a tumor, or a concussion, pain, emotional stimulus, or metabolic problems.  An obstruction in the gastrointestinal tract (bowel obstruction).  Illnesses such as diabetes, hepatitis, gallbladder problems, appendicitis, kidney problems, cancer, sepsis, atypical symptoms of a heart attack, or  eating disorders.  Medical treatments such as chemotherapy and radiation.  Receiving medicine that makes you sleep (general anesthetic) during surgery. DIAGNOSIS Your caregiver may ask for tests to be done if the problems do not improve after a few days. Tests may also be done if symptoms are severe or if the reason for the nausea and vomiting is not clear. Tests may include:  Urine tests.  Blood tests.  Stool tests.  Cultures (to look for evidence of infection).  X-rays or other imaging studies. Test results can help your caregiver make decisions about treatment or the need for additional tests. TREATMENT You need to stay well hydrated. Drink frequently but in small amounts.You may wish to drink water, sports drinks, clear broth, or eat frozen ice pops or gelatin dessert to help stay hydrated.When you eat, eating slowly may help prevent nausea.There are also some antinausea medicines that may help prevent nausea. HOME CARE INSTRUCTIONS   Take all medicine as directed by your caregiver.  If you do not have an appetite, do not force yourself to eat. However, you must continue to drink fluids.  If you have an appetite, eat a normal diet unless your caregiver tells you differently.  Eat a variety of complex carbohydrates (rice, wheat, potatoes, bread), lean meats, yogurt, fruits,   and vegetables.  Avoid high-fat foods because they are more difficult to digest.  Drink enough water and fluids to keep your urine clear or pale yellow.  If you are dehydrated, ask your caregiver for specific rehydration instructions. Signs of dehydration may include:  Severe thirst.  Dry lips and mouth.  Dizziness.  Dark urine.  Decreasing urine frequency and amount.  Confusion.  Rapid breathing or pulse. SEEK IMMEDIATE MEDICAL CARE IF:   You have blood or brown flecks (like coffee grounds) in your vomit.  You have black or bloody stools.  You have a severe headache or stiff  neck.  You are confused.  You have severe abdominal pain.  You have chest pain or trouble breathing.  You do not urinate at least once every 8 hours.  You develop cold or clammy skin.  You continue to vomit for longer than 24 to 48 hours.  You have a fever. MAKE SURE YOU:   Understand these instructions.  Will watch your condition.  Will get help right away if you are not doing well or get worse.   This information is not intended to replace advice given to you by your health care provider. Make sure you discuss any questions you have with your health care provider.   Document Released: 04/25/2005 Document Revised: 07/18/2011 Document Reviewed: 09/22/2010 Elsevier Interactive Patient Education 2016 Elsevier Inc.  

## 2016-03-03 ENCOUNTER — Telehealth: Payer: Self-pay | Admitting: *Deleted

## 2016-03-03 ENCOUNTER — Other Ambulatory Visit: Payer: Self-pay | Admitting: *Deleted

## 2016-03-03 DIAGNOSIS — M542 Cervicalgia: Secondary | ICD-10-CM

## 2016-03-03 NOTE — Progress Notes (Signed)
Called pt & informed of order for U/S of thyroid d/t enlargement noted on 01/24/16 CT report.

## 2016-03-03 NOTE — Telephone Encounter (Signed)
Received call from Lisa/Dr [facility] office stating that several messages have been left with no return call  & Dr Ulanda Edison states that he received a letter from Dr Jana Hakim regarding pt's breast cancer but wants to know if Dr Jana Hakim is following up on the thyroid nodule seen on CT.  Message to Dr Jana Hakim.

## 2016-03-04 ENCOUNTER — Telehealth: Payer: Self-pay | Admitting: Genetic Counselor

## 2016-03-04 NOTE — Telephone Encounter (Signed)
Discussed with Ms. Kratzke that her genetic test results were negative for mutations within any of 20 genes on the Breast/Ovarian Cancer Panel through [employer].  Additionally, no uncertain changes were found.  Discussed that most cancer is not genetic, so this could be a reassuring result for [place].  Her maternal aunt and maternal first cousin would still be eligible for genetic testing, if interested, as there could be a mutation in the family that Ms. Warfield herself just did not inherit.  Ms. Conrod should continue to follow her doctors' recommendations for future cancer screening.  Her mother, sisters, nieces, and granddaughter are still at an increased risk for breast cancer, simply due to the family history.  Ms. Platten knows she is welcome to call with any questions she may have.  She would like a copy of her results mailed to her.

## 2016-03-07 ENCOUNTER — Ambulatory Visit: Payer: Self-pay | Admitting: Genetic Counselor

## 2016-03-07 DIAGNOSIS — Z1379 Encounter for other screening for genetic and chromosomal anomalies: Secondary | ICD-10-CM

## 2016-03-07 DIAGNOSIS — Z803 Family history of malignant neoplasm of breast: Secondary | ICD-10-CM

## 2016-03-07 DIAGNOSIS — C50411 Malignant neoplasm of upper-outer quadrant of right female breast: Secondary | ICD-10-CM

## 2016-03-07 DIAGNOSIS — Z171 Estrogen receptor negative status [ER-]: Secondary | ICD-10-CM

## 2016-03-08 ENCOUNTER — Ambulatory Visit (HOSPITAL_BASED_OUTPATIENT_CLINIC_OR_DEPARTMENT_OTHER): Payer: Managed Care, Other (non HMO)

## 2016-03-08 ENCOUNTER — Ambulatory Visit (HOSPITAL_BASED_OUTPATIENT_CLINIC_OR_DEPARTMENT_OTHER): Payer: Managed Care, Other (non HMO) | Admitting: Oncology

## 2016-03-08 ENCOUNTER — Encounter: Payer: Self-pay | Admitting: *Deleted

## 2016-03-08 ENCOUNTER — Other Ambulatory Visit (HOSPITAL_BASED_OUTPATIENT_CLINIC_OR_DEPARTMENT_OTHER): Payer: Managed Care, Other (non HMO)

## 2016-03-08 ENCOUNTER — Other Ambulatory Visit: Payer: Self-pay | Admitting: *Deleted

## 2016-03-08 VITALS — BP 121/68 | HR 86 | Temp 98.0°F | Resp 18 | Ht 66.0 in | Wt 204.8 lb

## 2016-03-08 VITALS — BP 120/63 | HR 88 | Temp 98.7°F | Resp 17

## 2016-03-08 DIAGNOSIS — C50411 Malignant neoplasm of upper-outer quadrant of right female breast: Secondary | ICD-10-CM | POA: Diagnosis not present

## 2016-03-08 DIAGNOSIS — Z5111 Encounter for antineoplastic chemotherapy: Secondary | ICD-10-CM | POA: Diagnosis not present

## 2016-03-08 DIAGNOSIS — Z171 Estrogen receptor negative status [ER-]: Secondary | ICD-10-CM | POA: Diagnosis not present

## 2016-03-08 LAB — CBC WITH DIFFERENTIAL/PLATELET
BASO%: 1 % (ref 0.0–2.0)
Basophils Absolute: 0 10*3/uL (ref 0.0–0.1)
EOS ABS: 0 10*3/uL (ref 0.0–0.5)
EOS%: 0.8 % (ref 0.0–7.0)
HCT: 31.5 % — ABNORMAL LOW (ref 34.8–46.6)
HEMOGLOBIN: 10.1 g/dL — AB (ref 11.6–15.9)
LYMPH#: 0.7 10*3/uL — AB (ref 0.9–3.3)
LYMPH%: 18.2 % (ref 14.0–49.7)
MCH: 28.5 pg (ref 25.1–34.0)
MCHC: 32.1 g/dL (ref 31.5–36.0)
MCV: 88.7 fL (ref 79.5–101.0)
MONO#: 0.6 10*3/uL (ref 0.1–0.9)
MONO%: 16.1 % — AB (ref 0.0–14.0)
NEUT%: 63.9 % (ref 38.4–76.8)
NEUTROS ABS: 2.5 10*3/uL (ref 1.5–6.5)
PLATELETS: 152 10*3/uL (ref 145–400)
RBC: 3.55 10*6/uL — ABNORMAL LOW (ref 3.70–5.45)
RDW: 14.9 % — AB (ref 11.2–14.5)
WBC: 3.9 10*3/uL (ref 3.9–10.3)

## 2016-03-08 LAB — COMPREHENSIVE METABOLIC PANEL
ALBUMIN: 3.8 g/dL (ref 3.5–5.0)
ALK PHOS: 44 U/L (ref 40–150)
ALT: 33 U/L (ref 0–55)
AST: 24 U/L (ref 5–34)
Anion Gap: 9 mEq/L (ref 3–11)
BILIRUBIN TOTAL: 0.46 mg/dL (ref 0.20–1.20)
BUN: 11.7 mg/dL (ref 7.0–26.0)
CO2: 31 mEq/L — ABNORMAL HIGH (ref 22–29)
Calcium: 10.1 mg/dL (ref 8.4–10.4)
Chloride: 102 mEq/L (ref 98–109)
Creatinine: 0.8 mg/dL (ref 0.6–1.1)
GLUCOSE: 102 mg/dL (ref 70–140)
Potassium: 3.7 mEq/L (ref 3.5–5.1)
SODIUM: 142 meq/L (ref 136–145)
TOTAL PROTEIN: 7.2 g/dL (ref 6.4–8.3)

## 2016-03-08 MED ORDER — DEXAMETHASONE SODIUM PHOSPHATE 10 MG/ML IJ SOLN
INTRAMUSCULAR | Status: AC
  Filled 2016-03-08: qty 1

## 2016-03-08 MED ORDER — PACLITAXEL CHEMO INJECTION 300 MG/50ML
80.0000 mg/m2 | Freq: Once | INTRAVENOUS | Status: AC
  Administered 2016-03-08: 76 mg via INTRAVENOUS
  Filled 2016-03-08: qty 28

## 2016-03-08 MED ORDER — SODIUM CHLORIDE 0.9% FLUSH
10.0000 mL | INTRAVENOUS | Status: DC | PRN
Start: 2016-03-08 — End: 2016-03-08
  Administered 2016-03-08: 10 mL
  Filled 2016-03-08: qty 10

## 2016-03-08 MED ORDER — TOBRAMYCIN-DEXAMETHASONE 0.3-0.1 % OP SUSP: OPHTHALMIC | 0 refills | Status: DC

## 2016-03-08 MED ORDER — SODIUM CHLORIDE 0.9 % IV SOLN
20.0000 mg | Freq: Once | INTRAVENOUS | Status: AC
  Administered 2016-03-08: 20 mg via INTRAVENOUS
  Filled 2016-03-08: qty 2

## 2016-03-08 MED ORDER — HEPARIN SOD (PORK) LOCK FLUSH 100 UNIT/ML IV SOLN
500.0000 [IU] | Freq: Once | INTRAVENOUS | Status: AC | PRN
  Administered 2016-03-08: 500 [IU]
  Filled 2016-03-08: qty 5

## 2016-03-08 MED ORDER — FAMOTIDINE IN NACL 20-0.9 MG/50ML-% IV SOLN
20.0000 mg | Freq: Once | INTRAVENOUS | Status: AC
  Administered 2016-03-08: 20 mg via INTRAVENOUS

## 2016-03-08 MED ORDER — FAMOTIDINE IN NACL 20-0.9 MG/50ML-% IV SOLN
INTRAVENOUS | Status: AC
  Filled 2016-03-08: qty 50

## 2016-03-08 MED ORDER — SODIUM CHLORIDE 0.9 % IV SOLN
Freq: Once | INTRAVENOUS | Status: AC
  Administered 2016-03-08: 11:00:00 via INTRAVENOUS

## 2016-03-08 MED ORDER — DIPHENHYDRAMINE HCL 50 MG/ML IJ SOLN
INTRAMUSCULAR | Status: AC
  Filled 2016-03-08: qty 1

## 2016-03-08 MED ORDER — PROCHLORPERAZINE MALEATE 10 MG PO TABS: 10.0000 mg | ORAL_TABLET | Freq: Four times a day (QID) | ORAL | 0 refills | Status: DC | PRN

## 2016-03-08 MED ORDER — PALONOSETRON HCL INJECTION 0.25 MG/5ML
INTRAVENOUS | Status: AC
  Filled 2016-03-08: qty 5

## 2016-03-08 MED ORDER — DIPHENHYDRAMINE HCL 50 MG/ML IJ SOLN
25.0000 mg | Freq: Once | INTRAMUSCULAR | Status: AC
  Administered 2016-03-08: 25 mg via INTRAVENOUS

## 2016-03-08 NOTE — Patient Instructions (Signed)
[facility] Discharge Instructions for Patients Receiving Chemotherapy  Today you received the following chemotherapy agents:  Taxol  To help prevent nausea and vomiting after your treatment, we encourage you to take your nausea medication as prescribed.   If you develop nausea and vomiting that is not controlled by your nausea medication, call the clinic.   BELOW ARE SYMPTOMS THAT SHOULD BE REPORTED IMMEDIATELY:  *FEVER GREATER THAN 100.5 F  *CHILLS WITH OR WITHOUT FEVER  NAUSEA AND VOMITING THAT IS NOT CONTROLLED WITH YOUR NAUSEA MEDICATION  *UNUSUAL SHORTNESS OF BREATH  *UNUSUAL BRUISING OR BLEEDING  TENDERNESS IN MOUTH AND THROAT WITH OR WITHOUT PRESENCE OF ULCERS  *URINARY PROBLEMS  *BOWEL PROBLEMS  UNUSUAL RASH Items with * indicate a potential emergency and should be followed up as soon as possible.  Feel free to call the clinic you have any questions or concerns. The clinic phone number is [phone].  Please show the [facility] at check-in to the Emergency Department and triage nurse.  Paclitaxel injection What is this medicine? PACLITAXEL (PAK li TAX el) is a chemotherapy drug. It targets fast dividing cells, like cancer cells, and causes these cells to die. This medicine is used to treat ovarian cancer, breast cancer, and other cancers. This medicine may be used for other purposes; ask your health care provider or pharmacist if you have questions. What should I tell my health care provider before I take this medicine? They need to know if you have any of these conditions: -blood disorders -irregular heartbeat -infection (especially a virus infection such as chickenpox, cold sores, or herpes) -liver disease -previous or ongoing radiation therapy -an unusual or allergic reaction to paclitaxel, alcohol, polyoxyethylated castor oil, other chemotherapy agents, other medicines, foods, dyes, or preservatives -pregnant or trying to get  pregnant -breast-feeding How should I use this medicine? This drug is given as an infusion into a vein. It is administered in a hospital or clinic by a specially trained health care professional. Talk to your pediatrician regarding the use of this medicine in children. Special care may be needed. Overdosage: If you think you have taken too much of this medicine contact a poison control center or emergency room at once. NOTE: This medicine is only for you. Do not share this medicine with others. What if I miss a dose? It is important not to miss your dose. Call your doctor or health care professional if you are unable to keep an appointment. What may interact with this medicine? Do not take this medicine with any of the following medications: -disulfiram -metronidazole This medicine may also interact with the following medications: -cyclosporine -diazepam -ketoconazole -medicines to increase blood counts like filgrastim, pegfilgrastim, sargramostim -other chemotherapy drugs like cisplatin, doxorubicin, epirubicin, etoposide, teniposide, vincristine -quinidine -testosterone -vaccines -verapamil Talk to your doctor or health care professional before taking any of these medicines: -acetaminophen -aspirin -ibuprofen -ketoprofen -naproxen This list may not describe all possible interactions. Give your health care provider a list of all the medicines, herbs, non-prescription drugs, or dietary supplements you use. Also tell them if you smoke, drink alcohol, or use illegal drugs. Some items may interact with your medicine. What should I watch for while using this medicine? Your condition will be monitored carefully while you are receiving this medicine. You will need important blood work done while you are taking this medicine. This drug may make you feel generally unwell. This is not uncommon, as chemotherapy can affect healthy cells as well as  cancer cells. Report any side effects. Continue  your course of treatment even though you feel ill unless your doctor tells you to stop. This medicine can cause serious allergic reactions. To reduce your risk you will need to take other medicine(s) before treatment with this medicine. In some cases, you may be given additional medicines to help with side effects. Follow all directions for their use. Call your doctor or health care professional for advice if you get a fever, chills or sore throat, or other symptoms of a cold or flu. Do not treat yourself. This drug decreases your body's ability to fight infections. Try to avoid being around people who are sick. This medicine may increase your risk to bruise or bleed. Call your doctor or health care professional if you notice any unusual bleeding. Be careful brushing and flossing your teeth or using a toothpick because you may get an infection or bleed more easily. If you have any dental work done, tell your dentist you are receiving this medicine. Avoid taking products that contain aspirin, acetaminophen, ibuprofen, naproxen, or ketoprofen unless instructed by your doctor. These medicines may hide a fever. Do not become pregnant while taking this medicine. Women should inform their doctor if they wish to become pregnant or think they might be pregnant. There is a potential for serious side effects to an unborn child. Talk to your health care professional or pharmacist for more information. Do not breast-feed an infant while taking this medicine. Men are advised not to father a child while receiving this medicine. This product may contain alcohol. Ask your pharmacist or healthcare provider if this medicine contains alcohol. Be sure to tell all healthcare providers you are taking this medicine. Certain medicines, like metronidazole and disulfiram, can cause an unpleasant reaction when taken with alcohol. The reaction includes flushing, headache, nausea, vomiting, sweating, and increased thirst. The reaction  can last from 30 minutes to several hours. What side effects may I notice from receiving this medicine? Side effects that you should report to your doctor or health care professional as soon as possible: -allergic reactions like skin rash, itching or hives, swelling of the face, lips, or tongue -low blood counts - This drug may decrease the number of white blood cells, red blood cells and platelets. You may be at increased risk for infections and bleeding. -signs of infection - fever or chills, cough, sore throat, pain or difficulty passing urine -signs of decreased platelets or bleeding - bruising, pinpoint red spots on the skin, black, tarry stools, nosebleeds -signs of decreased red blood cells - unusually weak or tired, fainting spells, lightheadedness -breathing problems -chest pain -high or low blood pressure -mouth sores -nausea and vomiting -pain, swelling, redness or irritation at the injection site -pain, tingling, numbness in the hands or feet -slow or irregular heartbeat -swelling of the ankle, feet, hands Side effects that usually do not require medical attention (report to your doctor or health care professional if they continue or are bothersome): -bone pain -complete hair loss including hair on your head, underarms, pubic hair, eyebrows, and eyelashes -changes in the color of fingernails -diarrhea -loosening of the fingernails -loss of appetite -muscle or joint pain -red flush to skin -sweating This list may not describe all possible side effects. Call your doctor for medical advice about side effects. You may report side effects to FDA at 1-800-FDA-1088. Where should I keep my medicine? This drug is given in a hospital or clinic and will not be stored at home.  NOTE: This sheet is a summary. It may not cover all possible information. If you have questions about this medicine, talk to your doctor, pharmacist, or health care provider.    2016, Elsevier/[employer] Standard.  (2014-12-11 13:02:56)

## 2016-03-08 NOTE — Progress Notes (Signed)
[place]  Telephone:(336) [phone] [phone]) [phone]     ID: [name] DOB: May 29, 1965  MR#: [id number]  UVO#:[id number]  Patient Care Team: Bernerd Limbo, MD as PCP - General (Family Medicine) Rolm Bookbinder, MD as Consulting Physician (General Surgery) Chauncey Cruel, MD as Consulting Physician (Oncology) Gery Pray, MD as Consulting Physician (Radiation Oncology) Newton Pigg, MD as Consulting Physician (Obstetrics and Gynecology) Dorna Leitz, MD as Consulting Physician (Orthopedic Surgery) OTHER MD:  CHIEF COMPLAINT: Invasive breast cancer  CURRENT TREATMENT: Neoadjuvant chemotherapy   BREAST CANCER HISTORY: From the original intake note:  Cecylia had routine screening mammography [facility]. There was a new mass measuring 1.5 cm at the 11:00 position of the right breast. Right breast ultrasonography 12/10/2015 confirmed a 1.9 cm irregular mass in the upper-outer quadrant of the right breast posteriorly. There was a right axillary lymph node with focal cortical thickening.  On 12/14/2015 Laila underwent biopsy of the right breast mass at 9:00 position as well as a suspicious lymph node. A separate area in the right breast 10 cm from the nipple was also biopsied biopsied. The final pathology ([id number]) showed the additional area and the lymph node to be benign. The 9:00 mass however was positive for invasive ductal carcinoma, grade 3, estrogen receptor and progesterone receptor negative, with an MIB-1 of 70%, and HER-2 nonamplified with a signals ratio 1.44, and the number per cell 2.70.  Her subsequent history is as detailed below  INTERVAL HISTORY: Jayra returns today for follow-up of her triple negative breast cancer accompanied by her husband [name] [place] and her mother Today is day 1 cycle 1 of 12 planned cycles of weekly paclitaxel   REVIEW OF SYSTEMS: Ashritha is doing "better". She visited her mother yesterday, has been able  to eat and drink better (she did benefit from fluids last week). She denies mouth sores, nausea or vomiting, cough, phlegm production, pleurisy, rash, bleeding, fever, or change in bowel or bladder habits. A detailed review of systems today was otherwise stable  PAST MEDICAL HISTORY: Past Medical History:  Diagnosis Date  . Allergy    seasonal  . Anemia    prior to hysterectomy  . Arthritis   . Breast cancer of upper-outer quadrant of right female breast ([place]) 12/16/2015  . Depression    mild depression after death of daughter- no meds  . Hot flashes   . Hx of adenomatous colonic polyps 04/25/2014  . Hypertension     PAST SURGICAL HISTORY: Past Surgical History:  Procedure Laterality Date  . ABDOMINAL HYSTERECTOMY    . CHOLECYSTECTOMY N/A 09/09/2015   Procedure: LAPAROSCOPIC CHOLECYSTECTOMY;  Surgeon: Ralene Ok, MD;  Location: [facility];  Service: General;  Laterality: N/A;  . COLONOSCOPY    . KNEE ARTHROSCOPY  2016  . PORTACATH PLACEMENT Right 12/29/2015   Procedure: INSERTION PORT-A-CATH WITH ULTRASOUND GUIDANCE;  Surgeon: Rolm Bookbinder, MD;  Location: [place];  Service: General;  Laterality: Right;    FAMILY HISTORY Family History  Problem Relation Age of Onset  . Hypertension Mother   . Breast cancer Maternal Grandfather   . Breast cancer Other   . Breast cancer Cousin   . Sudden death Neg Hx   . Diabetes Neg Hx   . Heart attack Neg Hx   . Hyperlipidemia Neg Hx   . Colon cancer Neg Hx   . Esophageal cancer Neg Hx   . Rectal cancer Neg Hx   . Stomach cancer Neg Hx  The patient's father was murdered at age 42. The patient's mother is 52 years old as of August 2017. The patient has one brother, 2 sisters. A cousin was diagnosed with left breast cancer at age 8. A maternal aunt and a maternal grandmother were also diagnosed with breast cancer at age 69 and 44 respectively.  GYNECOLOGIC HISTORY:  No LMP recorded. Patient has had a hysterectomy.  Menarche age 37,  first live birth age 60, the patient is GX P3. She is status post hysterectomy without salpingo-oophorectomy. She did not take hormone replacement. She used oral contraceptives remotely without complications.  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 Her husband [name] [place] is a news and record Lexicographer. Son [name] lives in [place] and is a Games developer. Son [name] lives in [place] and is a Architectural technologist. The patient had a daughter who died at the age of 18.    ADVANCED DIRECTIVES: Not in place   HEALTH MAINTENANCE: Social History  Substance Use Topics  . Smoking status: Never Smoker  . Smokeless tobacco: Never Used  . Alcohol use No     Colonoscopy: 2016  PAP:  Bone density: Never   Allergies  Allergen Reactions  . Percocet [Oxycodone-Acetaminophen] Diarrhea and Nausea And Vomiting  . Tramadol Nausea And Vomiting  . Vicodin [Hydrocodone-Acetaminophen] Nausea And Vomiting  . Pollen Extract Other (See Comments)    Runny nose, itchy eyes and sneezing due to seasonal allergies.    Current Outpatient Prescriptions  Medication Sig Dispense Refill  . EPINEPHrine (EPIPEN 2-PAK) 0.3 mg/0.3 mL IJ SOAJ injection Inject 0.3 mg into the muscle once as needed (anaphylaxis allergic reaction).     Marland Kitchen ibuprofen (ADVIL,MOTRIN) 400 MG tablet Take 1 tablet (400 mg total) by mouth every 6 (six) hours as needed. (Patient not taking: Reported on 01/24/2016) 30 tablet 0  . lisinopril-hydrochlorothiazide (PRINZIDE,ZESTORETIC) 20-12.5 MG per tablet Take 1 tablet by mouth daily.    . meloxicam (MOBIC) 15 MG tablet Take 1 tablet (15 mg total) by mouth daily as needed for pain. 30 tablet 0  . omeprazole (PRILOSEC) 40 MG capsule Take 1 capsule (40 mg total) by mouth daily. 90 capsule 1  . potassium chloride (KLOR-CON 10) 10 MEQ tablet Take 1 tablet (10 mEq total) by mouth daily. 90 tablet 3  . prochlorperazine (COMPAZINE) 10 MG tablet Take 1 tablet (10 mg  total) by mouth every 6 (six) hours as needed for nausea or vomiting. 30 tablet 0   No current facility-administered medications for this visit.     OBJECTIVE: Middle-aged African-American woman Who appears stated age 50:   03/08/16 0820  BP: 121/68  Pulse: 86  Resp: 18  Temp: 98 F (36.7 C)     Body mass index is 33.06 kg/m.    ECOG FS:0 - Asymptomatic  Sclerae unicteric, EOMs intact Oropharynx clear and moist No cervical or supraclavicular adenopathy Lungs no rales or rhonchi Heart regular rate and rhythm Abd soft, nontender, positive bowel sounds MSK no focal spinal tenderness, no upper extremity lymphedema Neuro: nonfocal, well oriented, appropriate affect Breasts: The mass in the right breast is not palpable. There are no skin or nipple changes of concern. The right axilla is benign. Left breast is unremarkable    LAB RESULTS:  CMP     Component Value Date/Time   NA 142 03/08/2016 0805   K 3.7 03/08/2016 0805   CL 103 01/24/2016 0346   CO2 31 (H)  03/08/2016 0805   GLUCOSE 102 03/08/2016 0805   BUN 11.7 03/08/2016 0805   CREATININE 0.8 03/08/2016 0805   CALCIUM 10.1 03/08/2016 0805   PROT 7.2 03/08/2016 0805   ALBUMIN 3.8 03/08/2016 0805   AST 24 03/08/2016 0805   ALT 33 03/08/2016 0805   ALKPHOS 44 03/08/2016 0805   BILITOT 0.46 03/08/2016 0805   GFRNONAA >60 01/24/2016 0327   GFRAA >60 01/24/2016 0327    INo results found for: SPEP, UPEP  Lab Results  Component Value Date   WBC 3.9 03/08/2016   NEUTROABS 2.5 03/08/2016   HGB 10.1 (L) 03/08/2016   HCT 31.5 (L) 03/08/2016   MCV 88.7 03/08/2016   PLT 152 03/08/2016      Chemistry      Component Value Date/Time   NA 142 03/08/2016 0805   K 3.7 03/08/2016 0805   CL 103 01/24/2016 0346   CO2 31 (H) 03/08/2016 0805   BUN 11.7 03/08/2016 0805   CREATININE 0.8 03/08/2016 0805      Component Value Date/Time   CALCIUM 10.1 03/08/2016 0805   ALKPHOS 44 03/08/2016 0805   AST 24 03/08/2016 0805     ALT 33 03/08/2016 0805   BILITOT 0.46 03/08/2016 0805       No results found for: LABCA2  No components found for: LABCA125  No results for input(s): INR in the last 168 hours.  Urinalysis    Component Value Date/Time   COLORURINE YELLOW 07/24/2015 1730   APPEARANCEUR CLEAR 07/24/2015 1730   LABSPEC 1.015 07/24/2015 1730   PHURINE 6.5 07/24/2015 1730   GLUCOSEU NEGATIVE 07/24/2015 1730   HGBUR NEGATIVE 07/24/2015 1730   BILIRUBINUR NEGATIVE 07/24/2015 1730   KETONESUR NEGATIVE 07/24/2015 1730   PROTEINUR NEGATIVE 07/24/2015 1730   UROBILINOGEN 1.0 03/03/2015 1240   NITRITE NEGATIVE 07/24/2015 1730   LEUKOCYTESUR NEGATIVE 07/24/2015 1730     STUDIES: No results found.  ELIGIBLE FOR AVAILABLE RESEARCH PROTOCOL: PREVENT  ASSESSMENT: 50 y.o. [place] woman status post right breast upper outer quadrant biopsy 12/14/2015 for a clinical T1c N0, stage IA  invasive ductal carcinoma, grade 3, triple negative, with an MIB-1 of 70%  (1) genetics testing negative for mutations within any of 20 genes on the Breast/Ovarian Cancer Panel through [employer].  Additionally, no uncertain changes were found.   (2) neoadjuvant chemotherapy to consist of doxorubicin and cyclophosphamide in dose this fashion 4, completed 02/22/2016 followed by paclitaxel weekly 12, starting 03/08/2016  (a) cycle 4 of cyclophosphamide and doxorubicin held 1 week because of side effects  (3) breast conserving surgery to follow chemotherapy  (4) adjuvant radiation to follow surgery  (5) considered the PREVENT trial: decided against it  PLAN: Chyanna has completed the more difficult part of her chemotherapy and is now ready to start the weekly paclitaxel. We again discussed the possible toxicities, side effects and complications of this agent, which she should tolerate much better.  She understands she will not need dexamethasone for nausea control, but will use Compazine alone. She also will not  need Neulasta.  I'm going to see her again next week. I intend to drop the Decadron premeds for the first 3 cycles to eventually 4 mg only. If she tolerates this well I will start seeing her with every other treatment until she completes the full 12 doses.  She is aware of the danger of neuropathy developing and we will be asking her repeatedly regarding this.  Once she is done with chemotherapy of course  she will proceed to definitive surgery.  She knows to call for any problems that may develop before her next visit here.  :Chauncey Cruel, MD   03/08/2016 8:49 AM Medical Oncology and Hematology [facility] [place], [place] [address] Tel. [phone]    Fax. [phone] you

## 2016-03-11 ENCOUNTER — Ambulatory Visit (HOSPITAL_COMMUNITY)
Admission: RE | Admit: 2016-03-11 | Discharge: 2016-03-11 | Disposition: A | Discharge status: Home / Self Care | Payer: Managed Care, Other (non HMO) | Source: Ambulatory Visit | Attending: Oncology | Admitting: Oncology

## 2016-03-11 DIAGNOSIS — M542 Cervicalgia: Secondary | ICD-10-CM

## 2016-03-14 ENCOUNTER — Other Ambulatory Visit: Payer: Self-pay | Admitting: Oncology

## 2016-03-14 ENCOUNTER — Telehealth: Payer: Self-pay | Admitting: Medical Oncology

## 2016-03-14 NOTE — Telephone Encounter (Signed)
Left message to call  Back to see how she is doing after chemo last week.

## 2016-03-15 ENCOUNTER — Ambulatory Visit: Payer: Managed Care, Other (non HMO)

## 2016-03-15 ENCOUNTER — Other Ambulatory Visit (HOSPITAL_BASED_OUTPATIENT_CLINIC_OR_DEPARTMENT_OTHER): Payer: Managed Care, Other (non HMO)

## 2016-03-15 ENCOUNTER — Ambulatory Visit (HOSPITAL_BASED_OUTPATIENT_CLINIC_OR_DEPARTMENT_OTHER): Payer: Managed Care, Other (non HMO)

## 2016-03-15 ENCOUNTER — Encounter: Payer: Self-pay | Admitting: *Deleted

## 2016-03-15 ENCOUNTER — Ambulatory Visit (HOSPITAL_BASED_OUTPATIENT_CLINIC_OR_DEPARTMENT_OTHER): Payer: Managed Care, Other (non HMO) | Admitting: Oncology

## 2016-03-15 VITALS — BP 119/75 | HR 92 | Temp 98.4°F | Resp 18 | Wt 205.3 lb

## 2016-03-15 DIAGNOSIS — C50411 Malignant neoplasm of upper-outer quadrant of right female breast: Secondary | ICD-10-CM

## 2016-03-15 DIAGNOSIS — Z5111 Encounter for antineoplastic chemotherapy: Secondary | ICD-10-CM

## 2016-03-15 DIAGNOSIS — Z171 Estrogen receptor negative status [ER-]: Secondary | ICD-10-CM | POA: Diagnosis not present

## 2016-03-15 DIAGNOSIS — E041 Nontoxic single thyroid nodule: Secondary | ICD-10-CM | POA: Diagnosis not present

## 2016-03-15 LAB — CBC WITH DIFFERENTIAL/PLATELET
BASO%: 1.3 % (ref 0.0–2.0)
BASOS ABS: 0 10*3/uL (ref 0.0–0.1)
EOS ABS: 0 10*3/uL (ref 0.0–0.5)
EOS%: 0.4 % (ref 0.0–7.0)
HEMATOCRIT: 32 % — AB (ref 34.8–46.6)
HEMOGLOBIN: 10.2 g/dL — AB (ref 11.6–15.9)
LYMPH#: 0.6 10*3/uL — AB (ref 0.9–3.3)
LYMPH%: 21.6 % (ref 14.0–49.7)
MCH: 28 pg (ref 25.1–34.0)
MCHC: 31.9 g/dL (ref 31.5–36.0)
MCV: 87.9 fL (ref 79.5–101.0)
MONO#: 0.4 10*3/uL (ref 0.1–0.9)
MONO%: 14.6 % — ABNORMAL HIGH (ref 0.0–14.0)
NEUT#: 1.7 10*3/uL (ref 1.5–6.5)
NEUT%: 62.1 % (ref 38.4–76.8)
PLATELETS: 508 10*3/uL — AB (ref 145–400)
RBC: 3.64 10*6/uL — ABNORMAL LOW (ref 3.70–5.45)
RDW: 16.6 % — ABNORMAL HIGH (ref 11.2–14.5)
WBC: 2.7 10*3/uL — ABNORMAL LOW (ref 3.9–10.3)

## 2016-03-15 LAB — COMPREHENSIVE METABOLIC PANEL
ALBUMIN: 3.9 g/dL (ref 3.5–5.0)
ALK PHOS: 45 U/L (ref 40–150)
ALT: 51 U/L (ref 0–55)
ANION GAP: 9 meq/L (ref 3–11)
AST: 33 U/L (ref 5–34)
BUN: 13.8 mg/dL (ref 7.0–26.0)
CALCIUM: 9.8 mg/dL (ref 8.4–10.4)
CHLORIDE: 103 meq/L (ref 98–109)
CO2: 29 mEq/L (ref 22–29)
CREATININE: 0.8 mg/dL (ref 0.6–1.1)
EGFR: >90 mL/min/{1.73_m2} (ref >90)
Glucose: 92 mg/dl (ref 70–140)
Potassium: 3.7 mEq/L (ref 3.5–5.1)
Sodium: 141 mEq/L (ref 136–145)
Total Bilirubin: 0.51 mg/dL (ref 0.20–1.20)
Total Protein: 7.2 g/dL (ref 6.4–8.3)

## 2016-03-15 MED ORDER — DIPHENHYDRAMINE HCL 50 MG/ML IJ SOLN
25.0000 mg | Freq: Once | INTRAMUSCULAR | Status: AC
  Administered 2016-03-15: 25 mg via INTRAVENOUS

## 2016-03-15 MED ORDER — DIPHENHYDRAMINE HCL 50 MG/ML IJ SOLN
INTRAMUSCULAR | Status: AC
  Filled 2016-03-15: qty 1

## 2016-03-15 MED ORDER — DEXAMETHASONE SODIUM PHOSPHATE 10 MG/ML IJ SOLN
10.0000 mg | Freq: Once | INTRAMUSCULAR | Status: AC
  Administered 2016-03-15: 10 mg via INTRAVENOUS

## 2016-03-15 MED ORDER — PACLITAXEL CHEMO INJECTION 300 MG/50ML
80.0000 mg/m2 | Freq: Once | INTRAVENOUS | Status: AC
  Administered 2016-03-15: 168 mg via INTRAVENOUS
  Filled 2016-03-15: qty 28

## 2016-03-15 MED ORDER — FAMOTIDINE IN NACL 20-0.9 MG/50ML-% IV SOLN
INTRAVENOUS | Status: AC
  Filled 2016-03-15: qty 50

## 2016-03-15 MED ORDER — SODIUM CHLORIDE 0.9 % IV SOLN
Freq: Once | INTRAVENOUS | Status: AC
  Administered 2016-03-15: 12:00:00 via INTRAVENOUS

## 2016-03-15 MED ORDER — HEPARIN SOD (PORK) LOCK FLUSH 100 UNIT/ML IV SOLN
500.0000 [IU] | Freq: Once | INTRAVENOUS | Status: AC | PRN
  Administered 2016-03-15: 500 [IU]
  Filled 2016-03-15: qty 5

## 2016-03-15 MED ORDER — FAMOTIDINE IN NACL 20-0.9 MG/50ML-% IV SOLN
20.0000 mg | Freq: Once | INTRAVENOUS | Status: AC
  Administered 2016-03-15: 20 mg via INTRAVENOUS

## 2016-03-15 MED ORDER — DEXAMETHASONE SODIUM PHOSPHATE 10 MG/ML IJ SOLN
INTRAMUSCULAR | Status: AC
  Filled 2016-03-15: qty 1

## 2016-03-15 MED ORDER — SODIUM CHLORIDE 0.9% FLUSH
10.0000 mL | INTRAVENOUS | Status: DC | PRN
  Administered 2016-03-15: 10 mL
  Filled 2016-03-15: qty 10

## 2016-03-15 NOTE — Progress Notes (Signed)
[place]  Telephone:(336) [phone] Fax:(336) [phone]     ID: Lindsay [name] DOB: 1965-12-28  MR#: [id number]  KDX#:[id number]  Patient Care Team: Bernerd Limbo, MD as PCP - General (Family Medicine) Rolm Bookbinder, MD as Consulting Physician (General Surgery) Chauncey Cruel, MD as Consulting Physician (Oncology) Gery Pray, MD as Consulting Physician (Radiation Oncology) Newton Pigg, MD as Consulting Physician (Obstetrics and Gynecology) Dorna Leitz, MD as Consulting Physician (Orthopedic Surgery) OTHER MD:  CHIEF COMPLAINT: Invasive breast cancer  CURRENT TREATMENT: Neoadjuvant chemotherapy   BREAST CANCER HISTORY: From the original intake note:  Lindsay [name] had routine screening mammography with tomography at [facility] 12/04/2015. There was a new mass measuring 1.5 cm at the 11:00 position of the right breast. Right breast ultrasonography 12/10/2015 confirmed a 1.9 cm irregular mass in the upper-outer quadrant of the right breast posteriorly. There was a right axillary lymph node with focal cortical thickening.  On 12/14/2015 Lindsay [name] underwent biopsy of the right breast mass at 9:00 position as well as a suspicious lymph node. A separate area in the right breast 10 cm from the nipple was also biopsied biopsied. The final pathology ([id number]) showed the additional area and the lymph node to be benign. The 9:00 mass however was positive for invasive ductal carcinoma, grade 3, estrogen receptor and progesterone receptor negative, with an MIB-1 of 70%, and HER-2 nonamplified with a signals ratio 1.44, and the number per cell 2.70.  Her subsequent history is as detailed below  INTERVAL HISTORY: Lindsay [name] returns today for follow-up of her triple negative breast cancer accompanied by her husband Lindsay [name]. Today is day 1 cycle 2 of 12 planned cycles of weekly paclitaxel   Since her last visit here she also had her thyroid ultrasound. This is most consistent with a developing  multinodular goiter but there is one nodule on the left side that will need to be biopsied.  REVIEW OF SYSTEMS: Lindsay [name] did "pretty good" with the first cycle. She had no immediate reaction to the treatment. She did have some hurting in her legs for a HER-2 and this is not uncommon with the first Taxol dose. She says her sense of taste is better and she is eating better. She has more energy. A detailed review of systems today was otherwise stable  PAST MEDICAL HISTORY: Past Medical History:  Diagnosis Date  . Allergy    seasonal  . Anemia    prior to hysterectomy  . Arthritis   . Breast cancer of upper-outer quadrant of right female breast ([place]) 12/16/2015  . Depression    mild depression after death of daughter- no meds  . Hot flashes   . Hx of adenomatous colonic polyps 04/25/2014  . Hypertension     PAST SURGICAL HISTORY: Past Surgical History:  Procedure Laterality Date  . ABDOMINAL HYSTERECTOMY    . CHOLECYSTECTOMY N/A 09/09/2015   Procedure: LAPAROSCOPIC CHOLECYSTECTOMY;  Surgeon: Ralene Ok, MD;  Location: [facility];  Service: General;  Laterality: N/A;  . COLONOSCOPY    . KNEE ARTHROSCOPY  2016  . PORTACATH PLACEMENT Right 12/29/2015   Procedure: INSERTION PORT-A-CATH WITH ULTRASOUND GUIDANCE;  Surgeon: Rolm Bookbinder, MD;  Location: [place];  Service: General;  Laterality: Right;    FAMILY HISTORY Family History  Problem Relation Age of Onset  . Hypertension Mother   . Breast cancer Maternal Grandfather   . Breast cancer Other   . Breast cancer Cousin   . Sudden death Neg Hx   . Diabetes Neg Hx   .  Heart attack Neg Hx   . Hyperlipidemia Neg Hx   . Colon cancer Neg Hx   . Esophageal cancer Neg Hx   . Rectal cancer Neg Hx   . Stomach cancer Neg Hx   The patient's father was murdered at age 22. The patient's mother is 15 years old as of August 2017. The patient has one brother, 2 sisters. A cousin was diagnosed with left breast cancer at age 86. A maternal aunt and a  maternal grandmother were also diagnosed with breast cancer at age 61 and 72 respectively.  GYNECOLOGIC HISTORY:  No LMP recorded. Patient has had a hysterectomy.  Menarche age 53, first live birth age 28, the patient is GX P3. She is status post hysterectomy without salpingo-oophorectomy. She did not take hormone replacement. She used oral contraceptives remotely without complications.  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 Her husband Lindsay [name] is a news and record Lexicographer. Son [name] lives in [place] and is a Games developer. Son [name] lives in [place] and is a Architectural technologist. The patient had a daughter who died at the age of 48.    ADVANCED DIRECTIVES: Not in place   HEALTH MAINTENANCE: Social History  Substance Use Topics  . Smoking status: Never Smoker  . Smokeless tobacco: Never Used  . Alcohol use No     Colonoscopy: 2016  PAP:  Bone density: Never   Allergies  Allergen Reactions  . Percocet [Oxycodone-Acetaminophen] Diarrhea and Nausea And Vomiting  . Tramadol Nausea And Vomiting  . Vicodin [Hydrocodone-Acetaminophen] Nausea And Vomiting  . Pollen Extract Other (See Comments)    Runny nose, itchy eyes and sneezing due to seasonal allergies.    Current Outpatient Prescriptions  Medication Sig Dispense Refill  . EPINEPHrine (EPIPEN 2-PAK) 0.3 mg/0.3 mL IJ SOAJ injection Inject 0.3 mg into the muscle once as needed (anaphylaxis allergic reaction).     Marland Kitchen ibuprofen (ADVIL,MOTRIN) 400 MG tablet Take 1 tablet (400 mg total) by mouth every 6 (six) hours as needed. (Patient not taking: Reported on 01/24/2016) 30 tablet 0  . lisinopril-hydrochlorothiazide (PRINZIDE,ZESTORETIC) 20-12.5 MG per tablet Take 1 tablet by mouth daily.    . meloxicam (MOBIC) 15 MG tablet Take 1 tablet (15 mg total) by mouth daily as needed for pain. 30 tablet 0  . omeprazole (PRILOSEC) 40 MG capsule Take 1 capsule (40 mg total) by mouth daily. 90  capsule 1  . potassium chloride (KLOR-CON 10) 10 MEQ tablet Take 1 tablet (10 mEq total) by mouth daily. 90 tablet 3  . prochlorperazine (COMPAZINE) 10 MG tablet Take 1 tablet (10 mg total) by mouth every 6 (six) hours as needed for nausea or vomiting. 30 tablet 0  . tobramycin-dexamethasone (TOBRADEX) ophthalmic solution 1-2  drops each eye twice daily 5 mL 0   No current facility-administered medications for this visit.     OBJECTIVE: Middle-aged African-American woman In no acute distress Vitals:   03/15/16 1100  BP: 119/75  Pulse: 92  Resp: 18  Temp: 98.4 F (36.9 C)     Body mass index is 33.14 kg/m.    ECOG FS:1 - Symptomatic but completely ambulatory  Sclerae unicteric, pupils round and equal Oropharynx clear and moist-- no thrush or other lesions No cervical or supraclavicular adenopathy Lungs no rales or rhonchi Heart regular rate and rhythm Abd soft, nontender, positive bowel sounds MSK no focal spinal tenderness, no upper extremity lymphedema Neuro: nonfocal, well oriented, appropriate  affect Breasts: I do not palpate a mass in the right breast and there are no skin or nipple changes of concern. The right axilla is benign. Left breast is unremarkable.   LAB RESULTS:  CMP     Component Value Date/Time   NA 141 03/15/2016 1042   K 3.7 03/15/2016 1042   CL 103 01/24/2016 0346   CO2 29 03/15/2016 1042   GLUCOSE 92 03/15/2016 1042   BUN 13.8 03/15/2016 1042   CREATININE 0.8 03/15/2016 1042   CALCIUM 9.8 03/15/2016 1042   PROT 7.2 03/15/2016 1042   ALBUMIN 3.9 03/15/2016 1042   AST 33 03/15/2016 1042   ALT 51 03/15/2016 1042   ALKPHOS 45 03/15/2016 1042   BILITOT 0.51 03/15/2016 1042   GFRNONAA >60 01/24/2016 0327   GFRAA >60 01/24/2016 0327    INo results found for: SPEP, UPEP  Lab Results  Component Value Date   WBC 2.7 (L) 03/15/2016   NEUTROABS 1.7 03/15/2016   HGB 10.2 (L) 03/15/2016   HCT 32.0 (L) 03/15/2016   MCV 87.9 03/15/2016   PLT 508 (H)  03/15/2016      Chemistry      Component Value Date/Time   NA 141 03/15/2016 1042   K 3.7 03/15/2016 1042   CL 103 01/24/2016 0346   CO2 29 03/15/2016 1042   BUN 13.8 03/15/2016 1042   CREATININE 0.8 03/15/2016 1042      Component Value Date/Time   CALCIUM 9.8 03/15/2016 1042   ALKPHOS 45 03/15/2016 1042   AST 33 03/15/2016 1042   ALT 51 03/15/2016 1042   BILITOT 0.51 03/15/2016 1042       No results found for: LABCA2  No components found for: LABCA125  No results for input(s): INR in the last 168 hours.  Urinalysis    Component Value Date/Time   COLORURINE YELLOW 07/24/2015 1730   APPEARANCEUR CLEAR 07/24/2015 1730   LABSPEC 1.015 07/24/2015 1730   PHURINE 6.5 07/24/2015 1730   GLUCOSEU NEGATIVE 07/24/2015 1730   HGBUR NEGATIVE 07/24/2015 1730   BILIRUBINUR NEGATIVE 07/24/2015 1730   KETONESUR NEGATIVE 07/24/2015 1730   PROTEINUR NEGATIVE 07/24/2015 1730   UROBILINOGEN 1.0 03/03/2015 1240   NITRITE NEGATIVE 07/24/2015 1730   LEUKOCYTESUR NEGATIVE 07/24/2015 1730     STUDIES: US Thyroid  Result Date: 03/11/2016 CLINICAL DATA:  Incidental on CT. A large left thyroid nodule on CT. EXAM: THYROID ULTRASOUND TECHNIQUE: Ultrasound examination of the thyroid gland and adjacent soft tissues was performed. COMPARISON:  Chest CT 01/24/2016 FINDINGS: Parenchymal Echotexture: Normal Estimated total number of nodules >/= 1 cm: 2 Number of spongiform nodules >/=  2 cm not described below (TR1): 0 Number of mixed cystic and solid nodules >/= 1.5 cm not described below (TR2): 0 _________________________________________________________ Isthmus: Measures 0.2 cm in thickness. No discrete nodules are identified within the thyroid isthmus. _________________________________________________________ Right lobe: Measures 4.4 x 1.8 x 1.9 cm. Nodule # 1: Location: Right; Inferior Size: 1.6 x 1.0 x 1.5 cm. Composition: solid/almost completely solid (2) Echogenicity: isoechoic (1) Shape: not  taller-than-wide (0) Margins: ill-defined (0) Echogenic foci: none (0) ACR TI-RADS total points: 3. ACR TI-RADS risk category: TR3 (3 points). ACR TI-RADS recommendations: *Given size (>/= 1.5 - 2.4 cm) and appearance, a follow-up ultrasound in 1 year should be considered based on TI-RADS criteria. Nodule # 2: Location: Right; Inferior Size: 0.8 x 0.6 x 0.5 cm. Composition: mixed cystic and solid (1) Echogenicity: isoechoic (1) Shape: not taller-than-wide (0) Margins: smooth (0) Echogenic foci:  none (0) ACR TI-RADS total points: 2. ACR TI-RADS risk category: TR2 (2 points). ACR TI-RADS recommendations: This nodule does NOT meet TI-RADS criteria for biopsy or dedicated follow-up. There is a predominantly cystic nodule in the inferior right thyroid lobe measuring up to 0.7 cm. This nodule has some echogenic foci with ring down artifact. This nodule does not meet criteria for biopsy or dedicated follow-up. There is an additional small hypoechoic nodule in superior right thyroid lobe measuring up to 0.4 cm. _________________________________________________________ Left lobe: Measures 4.9 x 2.4 x 3.0 cm. Nodule # 3: Location: Left; Mid Size: 4.5 x 2.1 x 2.7 cm. Composition: solid/almost completely solid (2) Echogenicity: isoechoic (1) Shape: not taller-than-wide (0) Margins: ill-defined (0) Echogenic foci: none (0) ACR TI-RADS total points: 3. ACR TI-RADS risk category: TR3 (3 points). ACR TI-RADS recommendations: **Given size (>/= 2.5 cm) and appearance, fine needle aspiration of this mildly suspicious nodule should be considered based on TI-RADS criteria. There is an additional small hypoechoic nodule along the medial aspect of left thyroid lobe that measures up to 0.5 cm. IMPRESSION: Bilateral thyroid nodules. The dominant left thyroid nodule meets criteria for ultrasound-guided biopsy. The dominant right thyroid nodule meets criteria for 1 year follow-up. The above is in keeping with the ACR TI-RADS recommendations -  J Am Coll Radiol 2017;14:587-595. Electronically Signed   By: Markus Daft M.D.   On: 03/11/2016 14:24    ELIGIBLE FOR AVAILABLE RESEARCH PROTOCOL: considered the PREVENT trial: decided against it  ASSESSMENT: 50 y.o. [place] woman status post right breast upper outer quadrant biopsy 12/14/2015 for a clinical T1c N0, stage IA  invasive ductal carcinoma, grade 3, triple negative, with an MIB-1 of 70%  (1) genetics testing negative for mutations within any of 20 genes on the Breast/Ovarian Cancer Panel through [employer].  Additionally, no VUS were found.   (2) neoadjuvant chemotherapy to consist of doxorubicin and cyclophosphamide in dose this fashion 4, completed 02/22/2016 followed by paclitaxel weekly 12, starting 03/08/2016  (a) cycle 4 of cyclophosphamide and doxorubicin held 1 week because of side effects  (3) breast conserving surgery to follow chemotherapy  (4) adjuvant radiation to follow surgery  (5) left thyroid nodule biopsy pending    PLAN: Rionna tolerated her first dose of weekly paclitaxel well, and we are proceeding with treatment again today. I suspect she will not have a slight bony aches she had with the first cycle, since that usually dissipates after the first treatment.  I gave her a copy of her thyroid ultrasound results and reassured her very likely everything we are seeing there is benign. We do need to biopsy a prominent left thyroid nodule and we will do that sometime in early December.  I cautioned her again to let us know if she develops any symptoms suggestive of peripheral neuropathy. I will not see her with her third cycle next week but I will see her 2 weeks from now with cycle #4  She knows to call for any problems that may develop before that visit.  :Chauncey Cruel, MD   03/15/2016 11:25 AM Medical Oncology and Hematology [facility] [place], [place] [address] Tel. [phone]    Fax. [phone] you

## 2016-03-15 NOTE — Patient Instructions (Signed)
[facility] Discharge Instructions for Patients Receiving Chemotherapy  Today you received the following chemotherapy agents Taxol   To help prevent nausea and vomiting after your treatment, we encourage you to take your nausea medication as directed.   If you develop nausea and vomiting that is not controlled by your nausea medication, call the clinic.   BELOW ARE SYMPTOMS THAT SHOULD BE REPORTED IMMEDIATELY:  *FEVER GREATER THAN 100.5 F  *CHILLS WITH OR WITHOUT FEVER  NAUSEA AND VOMITING THAT IS NOT CONTROLLED WITH YOUR NAUSEA MEDICATION  *UNUSUAL SHORTNESS OF BREATH  *UNUSUAL BRUISING OR BLEEDING  TENDERNESS IN MOUTH AND THROAT WITH OR WITHOUT PRESENCE OF ULCERS  *URINARY PROBLEMS  *BOWEL PROBLEMS  UNUSUAL RASH Items with * indicate a potential emergency and should be followed up as soon as possible.  Feel free to call the clinic you have any questions or concerns. The clinic phone number is [phone].  Please show the CHEMO ALERT CARD at check-in to the Emergency Department and triage nurse.   

## 2016-03-20 DIAGNOSIS — Z1379 Encounter for other screening for genetic and chromosomal anomalies: Secondary | ICD-10-CM | POA: Insufficient documentation

## 2016-03-20 NOTE — Progress Notes (Signed)
GENETIC TEST RESULT  HPI: Lindsay [name] was previously seen in the [place] clinic due to a personal history of triple negative breast cancer at 73, family history of breast cancer, and concerns regarding a hereditary predisposition to cancer. Please refer to our prior cancer genetics clinic note from February 09, 2016 for more information regarding Lindsay [name]'s medical, social and family histories, and our assessment and recommendations, at the time. Lindsay [name] recent genetic test results were disclosed to her, as were recommendations warranted by these results. These results and recommendations are discussed in more detail below.  GENETIC TEST RESULTS: At the time of Lindsay [name]'s visit on 02/09/16, we recommended she pursue genetic testing of the 20-gene Breast/Ovarian Cancer Panel through [facility]. The Breast/Ovarian Cancer Panel offered by [facility] Hope Pigeon, MD) includes sequencing and deletion/duplication analysis for the following 19 genes:  ATM, BARD1, BRCA1, BRCA2, BRIP1, CDH1, CHEK2, FANCC, MLH1, MSH2, MSH6, NBN, PALB2, PMS2, PTEN, RAD51C, RAD51D, TP53, and XRCC2.  This panel also includes deletion/duplication analysis (without sequencing) for one gene, EPCAM.  Those results are now back, the report date for which is March 02, 2016.  Genetic testing was normal, and did not reveal a deleterious mutation in these genes.  Additionally, no variants of uncertain significance (VUSes) were found.  The test report will be scanned into EPIC and will be located under the Results Review tab in the Pathology>Molecular Pathology section.   We discussed with Lindsay [name] that since the current genetic testing is not perfect, it is possible there may be a gene mutation in one of these genes that current testing cannot detect, but that chance is small. We also discussed, that it is possible that another gene that has not yet been discovered, or that we have not yet tested, is  responsible for the cancer diagnoses in the family, and it is, therefore, important to remain in touch with cancer genetics in the future so that we can continue to offer Lindsay [name] the most up-to-date genetic testing.    CANCER SCREENING RECOMMENDATIONS: While we still do not have an explanation for the personal and family history of breast cancer, this result may be reassuring and indicate that Lindsay [name] likely does not have an increased risk for a future cancer due to a mutation in one of these genes. This normal test also suggests that Lindsay [name]'s cancer was most likely not due to an inherited predisposition associated with one of these genes.  Most cancers happen by chance and this negative test suggests that her cancer falls into this category.  Further genetic testing of other affected family members be be helpful to further elucidation of the personal and familial cancer risks. In the meantime, we recommended she continue to follow the cancer management and screening guidelines provided by her oncology and primary healthcare providers.   RECOMMENDATIONS FOR FAMILY MEMBERS: Women in this family are at some increased risk of developing breast cancer, over the general population risk, simply due to the family history of breast cancer. We recommended women in this family have a yearly mammogram beginning at age 23, or 51 years younger than the earliest onset of cancer, an annual clinical breast exam, and perform monthly breast self-exams. Women in this family should also have a gynecological exam as recommended by their primary provider. All family members should have a colonoscopy by age 9.  Based on Ms. Giovannini's family history, we recommended her maternal first cousin and her maternal aunt, who were  both diagnosed with breast cancer in their 72s, have genetic counseling and testing. Ms. Yackel will let us know if we can be of any assistance in coordinating genetic counseling and/or testing for these  family members.   FOLLOW-UP: Lastly, we discussed with Ms. Taddeo that cancer genetics is a rapidly advancing field and it is possible that new genetic tests will be appropriate for her and/or her family members in the future. We encouraged her to remain in contact with cancer genetics on an annual basis so we can update her personal and family histories and let her know of advances in cancer genetics that may benefit this family.   Our contact number was provided. Ms. Fei questions were answered to her satisfaction, and she knows she is welcome to call us at anytime with additional questions or concerns.   Jeanine Luz, MS, [facility] Certified Genetic Counselor [email or web address] Phone: [phone]

## 2016-03-22 ENCOUNTER — Other Ambulatory Visit (HOSPITAL_BASED_OUTPATIENT_CLINIC_OR_DEPARTMENT_OTHER): Payer: Managed Care, Other (non HMO)

## 2016-03-22 ENCOUNTER — Ambulatory Visit (HOSPITAL_BASED_OUTPATIENT_CLINIC_OR_DEPARTMENT_OTHER): Payer: Managed Care, Other (non HMO)

## 2016-03-22 VITALS — BP 102/62 | HR 85 | Temp 98.6°F | Resp 18

## 2016-03-22 DIAGNOSIS — C50411 Malignant neoplasm of upper-outer quadrant of right female breast: Secondary | ICD-10-CM | POA: Diagnosis not present

## 2016-03-22 DIAGNOSIS — Z5111 Encounter for antineoplastic chemotherapy: Secondary | ICD-10-CM

## 2016-03-22 DIAGNOSIS — Z171 Estrogen receptor negative status [ER-]: Principal | ICD-10-CM

## 2016-03-22 LAB — COMPREHENSIVE METABOLIC PANEL
ALBUMIN: 3.8 g/dL (ref 3.5–5.0)
ALK PHOS: 46 U/L (ref 40–150)
ALT: 49 U/L (ref 0–55)
AST: 32 U/L (ref 5–34)
Anion Gap: 10 mEq/L (ref 3–11)
BILIRUBIN TOTAL: 0.56 mg/dL (ref 0.20–1.20)
BUN: 13.4 mg/dL (ref 7.0–26.0)
CO2: 26 meq/L (ref 22–29)
Calcium: 9.9 mg/dL (ref 8.4–10.4)
Chloride: 105 mEq/L (ref 98–109)
Creatinine: 0.9 mg/dL (ref 0.6–1.1)
GLUCOSE: 92 mg/dL (ref 70–140)
Potassium: 3.5 mEq/L (ref 3.5–5.1)
SODIUM: 141 meq/L (ref 136–145)
TOTAL PROTEIN: 7.4 g/dL (ref 6.4–8.3)

## 2016-03-22 LAB — CBC WITH DIFFERENTIAL/PLATELET
BASO%: 2.1 % — AB (ref 0.0–2.0)
Basophils Absolute: 0.1 10*3/uL (ref 0.0–0.1)
EOS ABS: 0 10*3/uL (ref 0.0–0.5)
EOS%: 0.7 % (ref 0.0–7.0)
HCT: 30.1 % — ABNORMAL LOW (ref 34.8–46.6)
HEMOGLOBIN: 9.8 g/dL — AB (ref 11.6–15.9)
LYMPH%: 20.2 % (ref 14.0–49.7)
MCH: 29.1 pg (ref 25.1–34.0)
MCHC: 32.7 g/dL (ref 31.5–36.0)
MCV: 89.1 fL (ref 79.5–101.0)
MONO#: 0.4 10*3/uL (ref 0.1–0.9)
MONO%: 14.9 % — AB (ref 0.0–14.0)
NEUT%: 62.1 % (ref 38.4–76.8)
NEUTROS ABS: 1.6 10*3/uL (ref 1.5–6.5)
Platelets: 448 10*3/uL — ABNORMAL HIGH (ref 145–400)
RBC: 3.38 10*6/uL — AB (ref 3.70–5.45)
RDW: 17.5 % — AB (ref 11.2–14.5)
WBC: 2.5 10*3/uL — AB (ref 3.9–10.3)
lymph#: 0.5 10*3/uL — ABNORMAL LOW (ref 0.9–3.3)

## 2016-03-22 MED ORDER — DIPHENHYDRAMINE HCL 50 MG/ML IJ SOLN
INTRAMUSCULAR | Status: AC
  Filled 2016-03-22: qty 1

## 2016-03-22 MED ORDER — FAMOTIDINE IN NACL 20-0.9 MG/50ML-% IV SOLN
INTRAVENOUS | Status: AC
  Filled 2016-03-22: qty 50

## 2016-03-22 MED ORDER — DEXAMETHASONE SODIUM PHOSPHATE 10 MG/ML IJ SOLN
4.0000 mg | Freq: Once | INTRAMUSCULAR | Status: AC
  Administered 2016-03-22: 4 mg via INTRAVENOUS

## 2016-03-22 MED ORDER — DEXAMETHASONE SODIUM PHOSPHATE 10 MG/ML IJ SOLN
INTRAMUSCULAR | Status: AC
Start: 2016-03-22 — End: 2016-03-22
  Filled 2016-03-22: qty 1

## 2016-03-22 MED ORDER — DIPHENHYDRAMINE HCL 50 MG/ML IJ SOLN
25.0000 mg | Freq: Once | INTRAMUSCULAR | Status: AC
  Administered 2016-03-22: 25 mg via INTRAVENOUS

## 2016-03-22 MED ORDER — FAMOTIDINE IN NACL 20-0.9 MG/50ML-% IV SOLN
20.0000 mg | Freq: Once | INTRAVENOUS | Status: AC
  Administered 2016-03-22: 20 mg via INTRAVENOUS

## 2016-03-22 MED ORDER — HEPARIN SOD (PORK) LOCK FLUSH 100 UNIT/ML IV SOLN
500.0000 [IU] | Freq: Once | INTRAVENOUS | Status: AC | PRN
  Administered 2016-03-22: 500 [IU]
  Filled 2016-03-22: qty 5

## 2016-03-22 MED ORDER — SODIUM CHLORIDE 0.9% FLUSH
10.0000 mL | INTRAVENOUS | Status: DC | PRN
  Administered 2016-03-22: 10 mL
  Filled 2016-03-22: qty 10

## 2016-03-22 MED ORDER — SODIUM CHLORIDE 0.9 % IV SOLN
Freq: Once | INTRAVENOUS | Status: AC
  Administered 2016-03-22: 10:00:00 via INTRAVENOUS

## 2016-03-22 MED ORDER — PACLITAXEL CHEMO INJECTION 300 MG/50ML
80.0000 mg/m2 | Freq: Once | INTRAVENOUS | Status: AC
  Administered 2016-03-22: 168 mg via INTRAVENOUS
  Filled 2016-03-22: qty 28

## 2016-03-22 NOTE — Patient Instructions (Signed)
[facility] Discharge Instructions for Patients Receiving Chemotherapy  Today you received the following chemotherapy agents Taxol  To help prevent nausea and vomiting after your treatment, we encourage you to take your nausea medication   If you develop nausea and vomiting that is not controlled by your nausea medication, call the clinic.   BELOW ARE SYMPTOMS THAT SHOULD BE REPORTED IMMEDIATELY:  *FEVER GREATER THAN 100.5 F  *CHILLS WITH OR WITHOUT FEVER  NAUSEA AND VOMITING THAT IS NOT CONTROLLED WITH YOUR NAUSEA MEDICATION  *UNUSUAL SHORTNESS OF BREATH  *UNUSUAL BRUISING OR BLEEDING  TENDERNESS IN MOUTH AND THROAT WITH OR WITHOUT PRESENCE OF ULCERS  *URINARY PROBLEMS  *BOWEL PROBLEMS  UNUSUAL RASH Items with * indicate a potential emergency and should be followed up as soon as possible.  Feel free to call the clinic you have any questions or concerns. The clinic phone number is [phone].  Please show the CHEMO ALERT CARD at check-in to the Emergency Department and triage nurse.   

## 2016-03-29 ENCOUNTER — Other Ambulatory Visit (HOSPITAL_BASED_OUTPATIENT_CLINIC_OR_DEPARTMENT_OTHER): Payer: Managed Care, Other (non HMO)

## 2016-03-29 ENCOUNTER — Ambulatory Visit (HOSPITAL_BASED_OUTPATIENT_CLINIC_OR_DEPARTMENT_OTHER): Payer: Managed Care, Other (non HMO)

## 2016-03-29 ENCOUNTER — Ambulatory Visit (HOSPITAL_BASED_OUTPATIENT_CLINIC_OR_DEPARTMENT_OTHER): Payer: Managed Care, Other (non HMO) | Admitting: Oncology

## 2016-03-29 VITALS — BP 109/80 | HR 86 | Temp 97.7°F | Resp 18 | Ht 66.0 in | Wt 208.3 lb

## 2016-03-29 DIAGNOSIS — C50411 Malignant neoplasm of upper-outer quadrant of right female breast: Secondary | ICD-10-CM

## 2016-03-29 DIAGNOSIS — Z171 Estrogen receptor negative status [ER-]: Secondary | ICD-10-CM

## 2016-03-29 DIAGNOSIS — Z5111 Encounter for antineoplastic chemotherapy: Secondary | ICD-10-CM

## 2016-03-29 DIAGNOSIS — E041 Nontoxic single thyroid nodule: Secondary | ICD-10-CM

## 2016-03-29 LAB — COMPREHENSIVE METABOLIC PANEL
ALT: 43 U/L (ref 0–55)
ANION GAP: 9 meq/L (ref 3–11)
AST: 31 U/L (ref 5–34)
Albumin: 3.8 g/dL (ref 3.5–5.0)
Alkaline Phosphatase: 40 U/L (ref 40–150)
BUN: 16.6 mg/dL (ref 7.0–26.0)
CHLORIDE: 105 meq/L (ref 98–109)
CO2: 28 meq/L (ref 22–29)
CREATININE: 0.8 mg/dL (ref 0.6–1.1)
Calcium: 10.2 mg/dL (ref 8.4–10.4)
Glucose: 103 mg/dl (ref 70–140)
POTASSIUM: 3.6 meq/L (ref 3.5–5.1)
Sodium: 142 mEq/L (ref 136–145)
Total Bilirubin: 0.74 mg/dL (ref 0.20–1.20)
Total Protein: 7.3 g/dL (ref 6.4–8.3)

## 2016-03-29 LAB — CBC WITH DIFFERENTIAL/PLATELET
BASO%: 1.5 % (ref 0.0–2.0)
Basophils Absolute: 0 10*3/uL (ref 0.0–0.1)
EOS%: 0.8 % (ref 0.0–7.0)
Eosinophils Absolute: 0 10*3/uL (ref 0.0–0.5)
HCT: 30.9 % — ABNORMAL LOW (ref 34.8–46.6)
HGB: 10.2 g/dL — ABNORMAL LOW (ref 11.6–15.9)
LYMPH%: 25.9 % (ref 14.0–49.7)
MCH: 29.7 pg (ref 25.1–34.0)
MCHC: 33.2 g/dL (ref 31.5–36.0)
MCV: 89.5 fL (ref 79.5–101.0)
MONO#: 0.2 10*3/uL (ref 0.1–0.9)
MONO%: 7.3 % (ref 0.0–14.0)
NEUT#: 1.6 10*3/uL (ref 1.5–6.5)
NEUT%: 64.5 % (ref 38.4–76.8)
PLATELETS: 376 10*3/uL (ref 145–400)
RBC: 3.45 10*6/uL — AB (ref 3.70–5.45)
RDW: 18 % — ABNORMAL HIGH (ref 11.2–14.5)
WBC: 2.5 10*3/uL — ABNORMAL LOW (ref 3.9–10.3)
lymph#: 0.6 10*3/uL — ABNORMAL LOW (ref 0.9–3.3)

## 2016-03-29 MED ORDER — PACLITAXEL CHEMO INJECTION 300 MG/50ML
80.0000 mg/m2 | Freq: Once | INTRAVENOUS | Status: AC
  Administered 2016-03-29: 168 mg via INTRAVENOUS
  Filled 2016-03-29: qty 28

## 2016-03-29 MED ORDER — HEPARIN SOD (PORK) LOCK FLUSH 100 UNIT/ML IV SOLN
500.0000 [IU] | Freq: Once | INTRAVENOUS | Status: AC | PRN
Start: 2016-03-29 — End: 2016-03-29
  Administered 2016-03-29: 500 [IU]
  Filled 2016-03-29: qty 5

## 2016-03-29 MED ORDER — SODIUM CHLORIDE 0.9% FLUSH
10.0000 mL | INTRAVENOUS | Status: DC | PRN
  Administered 2016-03-29: 10 mL
  Filled 2016-03-29: qty 10

## 2016-03-29 MED ORDER — DEXAMETHASONE SODIUM PHOSPHATE 10 MG/ML IJ SOLN
INTRAMUSCULAR | Status: AC
  Filled 2016-03-29: qty 1

## 2016-03-29 MED ORDER — FAMOTIDINE IN NACL 20-0.9 MG/50ML-% IV SOLN
INTRAVENOUS | Status: AC
Start: 2016-03-29 — End: 2016-03-29
  Filled 2016-03-29: qty 50

## 2016-03-29 MED ORDER — DIPHENHYDRAMINE HCL 50 MG/ML IJ SOLN
INTRAMUSCULAR | Status: AC
  Filled 2016-03-29: qty 1

## 2016-03-29 MED ORDER — SODIUM CHLORIDE 0.9 % IV SOLN
Freq: Once | INTRAVENOUS | Status: AC
  Administered 2016-03-29: 10:00:00 via INTRAVENOUS

## 2016-03-29 MED ORDER — DIPHENHYDRAMINE HCL 50 MG/ML IJ SOLN
25.0000 mg | Freq: Once | INTRAMUSCULAR | Status: AC
  Administered 2016-03-29: 25 mg via INTRAVENOUS

## 2016-03-29 MED ORDER — FAMOTIDINE IN NACL 20-0.9 MG/50ML-% IV SOLN
20.0000 mg | Freq: Once | INTRAVENOUS | Status: AC
  Administered 2016-03-29: 20 mg via INTRAVENOUS

## 2016-03-29 MED ORDER — DEXAMETHASONE SODIUM PHOSPHATE 10 MG/ML IJ SOLN
4.0000 mg | Freq: Once | INTRAMUSCULAR | Status: AC
Start: 2016-03-29 — End: 2016-03-29
  Administered 2016-03-29: 4 mg via INTRAVENOUS

## 2016-03-29 NOTE — Progress Notes (Signed)
[facility]  [phone] [phone]     ID: Lindsay [name] DOB: 06-Sep-1965  MR#: [id number]  ZHY#:[id number]  Patient Care Team: Lindsay Limbo, MD as PCP - General (Family Medicine) Lindsay Bookbinder, MD as Consulting Physician (General Surgery) Lindsay Cruel, MD as Consulting Physician (Oncology) Lindsay Pray, MD as Consulting Physician (Radiation Oncology) Lindsay Pigg, MD as Consulting Physician (Obstetrics and Gynecology) Lindsay Leitz, MD as Consulting Physician (Orthopedic Surgery) OTHER MD:  CHIEF COMPLAINT: Invasive breast cancer  CURRENT TREATMENT: Neoadjuvant chemotherapy   BREAST CANCER HISTORY: From the original intake note:  Lindsay [name] had routine screening mammography with tomography at [facility] 12/04/2015. There was a new mass measuring 1.5 cm at the 11:00 position of the right breast. Right breast ultrasonography 12/10/2015 confirmed a 1.9 cm irregular mass in the upper-outer quadrant of the right breast posteriorly. There was a right axillary lymph node with focal cortical thickening.  On 12/14/2015 Lindsay [name] underwent biopsy of the right breast mass at 9:00 position as well as a suspicious lymph node. A separate area in the right breast 10 cm from the nipple was also biopsied biopsied. The final pathology ([id number]) showed the additional area and the lymph node to be benign. The 9:00 mass however was positive for invasive ductal carcinoma, grade 3, estrogen receptor and progesterone receptor negative, with an MIB-1 of 70%, and HER-2 nonamplified with a signals ratio 1.44, and the number per cell 2.70.  Her subsequent history is as detailed below  INTERVAL HISTORY: Lindsay [name] returns today for follow-up of her estrogen receptor negative breast cancer accompanied by her husband Lindsay [name]. Today is day 1 cycle 4 of 12 planned cycles of weekly paclitaxel    REVIEW OF SYSTEMS: Lindsay [name] is doing "real good". She has more energy, is going out with  friends, has no nausea, no mouth sores, and the port is working well. She is having no peripheral neuropathy symptoms. She feels somewhat hyperpigmentation is clearing up. A detailed review of systems today was stable  PAST MEDICAL HISTORY: Past Medical History:  Diagnosis Date  . Allergy    seasonal  . Anemia    prior to hysterectomy  . Arthritis   . Breast cancer of upper-outer quadrant of right female breast ([place]) 12/16/2015  . Depression    mild depression after death of daughter- no meds  . Hot flashes   . Hx of adenomatous colonic polyps 04/25/2014  . Hypertension     PAST SURGICAL HISTORY: Past Surgical History:  Procedure Laterality Date  . ABDOMINAL HYSTERECTOMY    . CHOLECYSTECTOMY N/A 09/09/2015   Procedure: LAPAROSCOPIC CHOLECYSTECTOMY;  Surgeon: Lindsay Ok, MD;  Location: [facility];  Service: General;  Laterality: N/A;  . COLONOSCOPY    . KNEE ARTHROSCOPY  2016  . PORTACATH PLACEMENT Right 12/29/2015   Procedure: INSERTION PORT-A-CATH WITH ULTRASOUND GUIDANCE;  Surgeon: Lindsay Bookbinder, MD;  Location: [place];  Service: General;  Laterality: Right;    FAMILY HISTORY Family History  Problem Relation Age of Onset  . Hypertension Mother   . Breast cancer Maternal Grandfather   . Breast cancer Other   . Breast cancer Cousin   . Sudden death Neg Hx   . Diabetes Neg Hx   . Heart attack Neg Hx   . Hyperlipidemia Neg Hx   . Colon cancer Neg Hx   . Esophageal cancer Neg Hx   . Rectal cancer Neg Hx   . Stomach cancer Neg Hx   The patient's father was murdered at  age 68. The patient's mother is 38 years old as of August 2017. The patient has one brother, 2 sisters. A cousin was diagnosed with left breast cancer at age 44. A maternal aunt and a maternal grandmother were also diagnosed with breast cancer at age 63 and 49 respectively.  GYNECOLOGIC HISTORY:  No LMP recorded. Patient has had a hysterectomy.  Menarche age 20, first live birth age 14, the patient is GX P3.  She is status post hysterectomy without salpingo-oophorectomy. She did not take hormone replacement. She used oral contraceptives remotely without complications.  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 Her husband Lindsay [name] is a news and record Lexicographer. Son Lindsay [name] lives in [place] and is a Games developer. Son Lindsay [name] more lives in [place] and is a Architectural technologist. The patient had a daughter who died at the age of 41.    ADVANCED DIRECTIVES: Not in place   HEALTH MAINTENANCE: Social History  Substance Use Topics  . Smoking status: Never Smoker  . Smokeless tobacco: Never Used  . Alcohol use No     Colonoscopy: 2016  PAP:  Bone density: Never   Allergies  Allergen Reactions  . Percocet [Oxycodone-Acetaminophen] Diarrhea and Nausea And Vomiting  . Tramadol Nausea And Vomiting  . Vicodin [Hydrocodone-Acetaminophen] Nausea And Vomiting  . Pollen Extract Other (See Comments)    Runny nose, itchy eyes and sneezing due to seasonal allergies.    Current Outpatient Prescriptions  Medication Sig Dispense Refill  . EPINEPHrine (EPIPEN 2-PAK) 0.3 mg/0.3 mL IJ SOAJ injection Inject 0.3 mg into the muscle once as needed (anaphylaxis allergic reaction).     Marland Kitchen ibuprofen (ADVIL,MOTRIN) 400 MG tablet Take 1 tablet (400 mg total) by mouth every 6 (six) hours as needed. (Patient not taking: Reported on 01/24/2016) 30 tablet 0  . lisinopril-hydrochlorothiazide (PRINZIDE,ZESTORETIC) 20-12.5 MG per tablet Take 1 tablet by mouth daily.    . meloxicam (MOBIC) 15 MG tablet Take 1 tablet (15 mg total) by mouth daily as needed for pain. 30 tablet 0  . omeprazole (PRILOSEC) 40 MG capsule Take 1 capsule (40 mg total) by mouth daily. 90 capsule 1  . potassium chloride (KLOR-CON 10) 10 MEQ tablet Take 1 tablet (10 mEq total) by mouth daily. 90 tablet 3  . prochlorperazine (COMPAZINE) 10 MG tablet Take 1 tablet (10 mg total) by mouth every 6 (six) hours as needed  for nausea or vomiting. 30 tablet 0  . tobramycin-dexamethasone (TOBRADEX) ophthalmic solution 1-2  drops each eye twice daily 5 mL 0   No current facility-administered medications for this visit.     OBJECTIVE: Middle-aged African-American woman Who appears well Vitals:   03/29/16 0812  BP: 109/80  Pulse: 86  Resp: 18  Temp: 97.7 F (36.5 C)     Body mass index is 33.62 kg/m.    ECOG FS:0 - Asymptomatic  Sclerae unicteric, EOMs intact Oropharynx clear and moist No cervical or supraclavicular adenopathy Lungs no rales or rhonchi Heart regular rate and rhythm Abd soft, nontender, positive bowel sounds MSK no focal spinal tenderness, no upper extremity lymphedema Neuro: nonfocal, well oriented, appropriate affect Breasts: Deferred   LAB RESULTS:  CMP     Component Value Date/Time   NA 141 03/22/2016 0902   K 3.5 03/22/2016 0902   CL 103 01/24/2016 0346   CO2 26 03/22/2016 0902   GLUCOSE 92 03/22/2016 0902   BUN 13.4 03/22/2016 0902   CREATININE 0.9 03/22/2016  0902   CALCIUM 9.9 03/22/2016 0902   PROT 7.4 03/22/2016 0902   ALBUMIN 3.8 03/22/2016 0902   AST 32 03/22/2016 0902   ALT 49 03/22/2016 0902   ALKPHOS 46 03/22/2016 0902   BILITOT 0.56 03/22/2016 0902   GFRNONAA >60 01/24/2016 0327   GFRAA >60 01/24/2016 0327    INo results found for: SPEP, UPEP  Lab Results  Component Value Date   WBC 2.5 (L) 03/29/2016   NEUTROABS 1.6 03/29/2016   HGB 10.2 (L) 03/29/2016   HCT 30.9 (L) 03/29/2016   MCV 89.5 03/29/2016   PLT 376 03/29/2016      Chemistry      Component Value Date/Time   NA 141 03/22/2016 0902   K 3.5 03/22/2016 0902   CL 103 01/24/2016 0346   CO2 26 03/22/2016 0902   BUN 13.4 03/22/2016 0902   CREATININE 0.9 03/22/2016 0902      Component Value Date/Time   CALCIUM 9.9 03/22/2016 0902   ALKPHOS 46 03/22/2016 0902   AST 32 03/22/2016 0902   ALT 49 03/22/2016 0902   BILITOT 0.56 03/22/2016 0902       No results found for:  LABCA2  No components found for: LABCA125  No results for input(s): INR in the last 168 hours.  Urinalysis    Component Value Date/Time   COLORURINE YELLOW 07/24/2015 1730   APPEARANCEUR CLEAR 07/24/2015 1730   LABSPEC 1.015 07/24/2015 1730   PHURINE 6.5 07/24/2015 1730   GLUCOSEU NEGATIVE 07/24/2015 1730   HGBUR NEGATIVE 07/24/2015 1730   BILIRUBINUR NEGATIVE 07/24/2015 1730   KETONESUR NEGATIVE 07/24/2015 1730   PROTEINUR NEGATIVE 07/24/2015 1730   UROBILINOGEN 1.0 03/03/2015 1240   NITRITE NEGATIVE 07/24/2015 1730   LEUKOCYTESUR NEGATIVE 07/24/2015 1730     STUDIES: US Thyroid  Result Date: 03/11/2016 CLINICAL DATA:  Incidental on CT. A large left thyroid nodule on CT. EXAM: THYROID ULTRASOUND TECHNIQUE: Ultrasound examination of the thyroid gland and adjacent soft tissues was performed. COMPARISON:  Chest CT 01/24/2016 FINDINGS: Parenchymal Echotexture: Normal Estimated total number of nodules >/= 1 cm: 2 Number of spongiform nodules >/=  2 cm not described below (TR1): 0 Number of mixed cystic and solid nodules >/= 1.5 cm not described below (TR2): 0 _________________________________________________________ Isthmus: Measures 0.2 cm in thickness. No discrete nodules are identified within the thyroid isthmus. _________________________________________________________ Right lobe: Measures 4.4 x 1.8 x 1.9 cm. Nodule # 1: Location: Right; Inferior Size: 1.6 x 1.0 x 1.5 cm. Composition: solid/almost completely solid (2) Echogenicity: isoechoic (1) Shape: not taller-than-wide (0) Margins: ill-defined (0) Echogenic foci: none (0) ACR TI-RADS total points: 3. ACR TI-RADS risk category: TR3 (3 points). ACR TI-RADS recommendations: *Given size (>/= 1.5 - 2.4 cm) and appearance, a follow-up ultrasound in 1 year should be considered based on TI-RADS criteria. Nodule # 2: Location: Right; Inferior Size: 0.8 x 0.6 x 0.5 cm. Composition: mixed cystic and solid (1) Echogenicity: isoechoic (1) Shape:  not taller-than-wide (0) Margins: smooth (0) Echogenic foci: none (0) ACR TI-RADS total points: 2. ACR TI-RADS risk category: TR2 (2 points). ACR TI-RADS recommendations: This nodule does NOT meet TI-RADS criteria for biopsy or dedicated follow-up. There is a predominantly cystic nodule in the inferior right thyroid lobe measuring up to 0.7 cm. This nodule has some echogenic foci with ring down artifact. This nodule does not meet criteria for biopsy or dedicated follow-up. There is an additional small hypoechoic nodule in superior right thyroid lobe measuring up to 0.4 cm. _________________________________________________________ Left  lobe: Measures 4.9 x 2.4 x 3.0 cm. Nodule # 3: Location: Left; Mid Size: 4.5 x 2.1 x 2.7 cm. Composition: solid/almost completely solid (2) Echogenicity: isoechoic (1) Shape: not taller-than-wide (0) Margins: ill-defined (0) Echogenic foci: none (0) ACR TI-RADS total points: 3. ACR TI-RADS risk category: TR3 (3 points). ACR TI-RADS recommendations: **Given size (>/= 2.5 cm) and appearance, fine needle aspiration of this mildly suspicious nodule should be considered based on TI-RADS criteria. There is an additional small hypoechoic nodule along the medial aspect of left thyroid lobe that measures up to 0.5 cm. IMPRESSION: Bilateral thyroid nodules. The dominant left thyroid nodule meets criteria for ultrasound-guided biopsy. The dominant right thyroid nodule meets criteria for 1 year follow-up. The above is in keeping with the ACR TI-RADS recommendations - J Am Coll Radiol 2017;14:587-595. Electronically Signed   By: Markus Daft M.D.   On: 03/11/2016 14:24    ELIGIBLE FOR AVAILABLE RESEARCH PROTOCOL: considered the PREVENT trial: decided against it  ASSESSMENT: 50 y.o. [place] woman status post right breast upper outer quadrant biopsy 12/14/2015 for a clinical T1c N0, stage IA  invasive ductal carcinoma, grade 3, triple negative, with an MIB-1 of 70%  (1) genetics testing  negative for mutations within any of 20 genes on the Breast/Ovarian Cancer Panel through [facility] [employer] [facility].  Additionally, no VUS were found.   (2) neoadjuvant chemotherapy consisting of doxorubicin and cyclophosphamide in dose this fashion 4, completed 02/22/2016 followed by paclitaxel weekly 12, starting 03/08/2016  (a) cycle 4 of cyclophosphamide and doxorubicin held 1 week because of side effects  (3) breast conserving surgery to follow chemotherapy  (4) adjuvant radiation to follow surgery  (5) left thyroid nodule biopsy pending    PLAN: Jennifermarie is very encouraged that she is tolerating weekly paclitaxel treatments without any side effects. She is proceeding to cycle 4 today.  We again reviewed the concerns regarding peripheral neuropathy, and if she develops any numbness or tingling in her finger pads or 2 pads she will let me know.  She is scheduled for thyroid biopsy on December 7. I'm going to see her again on December 12, with her seventh dose of Taxol.  She knows to call for any problems that may develop before that visit.     :Lindsay Cruel, MD   03/29/2016 8:17 AM Medical Oncology and Hematology [facility] [place], [place] [address] Tel. [phone]    Fax. [phone] you

## 2016-03-29 NOTE — Patient Instructions (Signed)
[facility] Discharge Instructions for Patients Receiving Chemotherapy  Today you received the following chemotherapy agents:  Taxol  To help prevent nausea and vomiting after your treatment, we encourage you to take your nausea medication as prescribed.   If you develop nausea and vomiting that is not controlled by your nausea medication, call the clinic.   BELOW ARE SYMPTOMS THAT SHOULD BE REPORTED IMMEDIATELY:  *FEVER GREATER THAN 100.5 F  *CHILLS WITH OR WITHOUT FEVER  NAUSEA AND VOMITING THAT IS NOT CONTROLLED WITH YOUR NAUSEA MEDICATION  *UNUSUAL SHORTNESS OF BREATH  *UNUSUAL BRUISING OR BLEEDING  TENDERNESS IN MOUTH AND THROAT WITH OR WITHOUT PRESENCE OF ULCERS  *URINARY PROBLEMS  *BOWEL PROBLEMS  UNUSUAL RASH Items with * indicate a potential emergency and should be followed up as soon as possible.  Feel free to call the clinic you have any questions or concerns. The clinic phone number is [phone].  Please show the CHEMO ALERT CARD at check-in to the Emergency Department and triage nurse.   

## 2016-04-01 ENCOUNTER — Ambulatory Visit: Payer: Managed Care, Other (non HMO) | Admitting: Oncology

## 2016-04-04 ENCOUNTER — Ambulatory Visit: Payer: Managed Care, Other (non HMO) | Admitting: Oncology

## 2016-04-05 ENCOUNTER — Other Ambulatory Visit (HOSPITAL_BASED_OUTPATIENT_CLINIC_OR_DEPARTMENT_OTHER): Payer: Managed Care, Other (non HMO)

## 2016-04-05 ENCOUNTER — Other Ambulatory Visit: Payer: Self-pay | Admitting: *Deleted

## 2016-04-05 ENCOUNTER — Ambulatory Visit (HOSPITAL_BASED_OUTPATIENT_CLINIC_OR_DEPARTMENT_OTHER): Payer: Managed Care, Other (non HMO)

## 2016-04-05 ENCOUNTER — Other Ambulatory Visit: Payer: Self-pay | Admitting: Oncology

## 2016-04-05 VITALS — BP 119/84 | HR 84 | Temp 98.5°F | Resp 16

## 2016-04-05 DIAGNOSIS — C50411 Malignant neoplasm of upper-outer quadrant of right female breast: Secondary | ICD-10-CM

## 2016-04-05 DIAGNOSIS — Z5111 Encounter for antineoplastic chemotherapy: Secondary | ICD-10-CM

## 2016-04-05 DIAGNOSIS — Z171 Estrogen receptor negative status [ER-]: Principal | ICD-10-CM

## 2016-04-05 LAB — CBC WITH DIFFERENTIAL/PLATELET
BASO%: 0.8 % (ref 0.0–2.0)
Basophils Absolute: 0 10*3/uL (ref 0.0–0.1)
EOS ABS: 0 10*3/uL (ref 0.0–0.5)
EOS%: 1.6 % (ref 0.0–7.0)
HCT: 32.3 % — ABNORMAL LOW (ref 34.8–46.6)
HGB: 10.4 g/dL — ABNORMAL LOW (ref 11.6–15.9)
LYMPH%: 28.9 % (ref 14.0–49.7)
MCH: 29.3 pg (ref 25.1–34.0)
MCHC: 32.2 g/dL (ref 31.5–36.0)
MCV: 91 fL (ref 79.5–101.0)
MONO#: 0.3 10*3/uL (ref 0.1–0.9)
MONO%: 10.4 % (ref 0.0–14.0)
NEUT%: 58.3 % (ref 38.4–76.8)
NEUTROS ABS: 1.5 10*3/uL (ref 1.5–6.5)
PLATELETS: 339 10*3/uL (ref 145–400)
RBC: 3.55 10*6/uL — AB (ref 3.70–5.45)
RDW: 15.8 % — ABNORMAL HIGH (ref 11.2–14.5)
WBC: 2.5 10*3/uL — AB (ref 3.9–10.3)
lymph#: 0.7 10*3/uL — ABNORMAL LOW (ref 0.9–3.3)

## 2016-04-05 LAB — COMPREHENSIVE METABOLIC PANEL
ALT: 38 U/L (ref 0–55)
ANION GAP: 10 meq/L (ref 3–11)
AST: 29 U/L (ref 5–34)
Albumin: 3.8 g/dL (ref 3.5–5.0)
Alkaline Phosphatase: 43 U/L (ref 40–150)
BILIRUBIN TOTAL: 0.71 mg/dL (ref 0.20–1.20)
BUN: 14.2 mg/dL (ref 7.0–26.0)
CHLORIDE: 104 meq/L (ref 98–109)
CO2: 29 meq/L (ref 22–29)
Calcium: 9.9 mg/dL (ref 8.4–10.4)
Creatinine: 0.8 mg/dL (ref 0.6–1.1)
GLUCOSE: 89 mg/dL (ref 70–140)
POTASSIUM: 3.6 meq/L (ref 3.5–5.1)
SODIUM: 143 meq/L (ref 136–145)
Total Protein: 7.4 g/dL (ref 6.4–8.3)

## 2016-04-05 MED ORDER — PACLITAXEL CHEMO INJECTION 300 MG/50ML
80.0000 mg/m2 | Freq: Once | INTRAVENOUS | Status: AC
  Administered 2016-04-05: 168 mg via INTRAVENOUS
  Filled 2016-04-05: qty 28

## 2016-04-05 MED ORDER — SODIUM CHLORIDE 0.9% FLUSH
10.0000 mL | INTRAVENOUS | Status: DC | PRN
  Administered 2016-04-05: 10 mL
  Filled 2016-04-05: qty 10

## 2016-04-05 MED ORDER — HEPARIN SOD (PORK) LOCK FLUSH 100 UNIT/ML IV SOLN
500.0000 [IU] | Freq: Once | INTRAVENOUS | Status: AC | PRN
  Administered 2016-04-05: 500 [IU]
  Filled 2016-04-05: qty 5

## 2016-04-05 MED ORDER — DEXAMETHASONE SODIUM PHOSPHATE 10 MG/ML IJ SOLN
4.0000 mg | Freq: Once | INTRAMUSCULAR | Status: AC
  Administered 2016-04-05: 4 mg via INTRAVENOUS

## 2016-04-05 MED ORDER — DEXAMETHASONE SODIUM PHOSPHATE 10 MG/ML IJ SOLN
INTRAMUSCULAR | Status: AC
  Filled 2016-04-05: qty 1

## 2016-04-05 MED ORDER — DIPHENHYDRAMINE HCL 50 MG/ML IJ SOLN
25.0000 mg | Freq: Once | INTRAMUSCULAR | Status: AC
  Administered 2016-04-05: 25 mg via INTRAVENOUS

## 2016-04-05 MED ORDER — FAMOTIDINE IN NACL 20-0.9 MG/50ML-% IV SOLN
20.0000 mg | Freq: Once | INTRAVENOUS | Status: AC
  Administered 2016-04-05: 20 mg via INTRAVENOUS

## 2016-04-05 MED ORDER — FAMOTIDINE IN NACL 20-0.9 MG/50ML-% IV SOLN
INTRAVENOUS | Status: AC
  Filled 2016-04-05: qty 50

## 2016-04-05 MED ORDER — DIPHENHYDRAMINE HCL 50 MG/ML IJ SOLN
INTRAMUSCULAR | Status: AC
  Filled 2016-04-05: qty 1

## 2016-04-05 MED ORDER — SODIUM CHLORIDE 0.9 % IV SOLN
Freq: Once | INTRAVENOUS | Status: AC
  Administered 2016-04-05: 10:00:00 via INTRAVENOUS

## 2016-04-05 NOTE — Progress Notes (Signed)
This appt is for chemo only NOT IVF hydration, per [facility] RN

## 2016-04-05 NOTE — Patient Instructions (Signed)
[facility] Discharge Instructions for Patients Receiving Chemotherapy  Today you received the following chemotherapy agents Taxol   To help prevent nausea and vomiting after your treatment, we encourage you to take your nausea medication as directed.   If you develop nausea and vomiting that is not controlled by your nausea medication, call the clinic.   BELOW ARE SYMPTOMS THAT SHOULD BE REPORTED IMMEDIATELY:  *FEVER GREATER THAN 100.5 F  *CHILLS WITH OR WITHOUT FEVER  NAUSEA AND VOMITING THAT IS NOT CONTROLLED WITH YOUR NAUSEA MEDICATION  *UNUSUAL SHORTNESS OF BREATH  *UNUSUAL BRUISING OR BLEEDING  TENDERNESS IN MOUTH AND THROAT WITH OR WITHOUT PRESENCE OF ULCERS  *URINARY PROBLEMS  *BOWEL PROBLEMS  UNUSUAL RASH Items with * indicate a potential emergency and should be followed up as soon as possible.  Feel free to call the clinic you have any questions or concerns. The clinic phone number is [phone].  Please show the CHEMO ALERT CARD at check-in to the Emergency Department and triage nurse.   

## 2016-04-07 ENCOUNTER — Other Ambulatory Visit: Payer: Self-pay | Admitting: Oncology

## 2016-04-07 ENCOUNTER — Telehealth: Payer: Self-pay | Admitting: *Deleted

## 2016-04-07 ENCOUNTER — Other Ambulatory Visit: Payer: Self-pay | Admitting: *Deleted

## 2016-04-07 DIAGNOSIS — C50411 Malignant neoplasm of upper-outer quadrant of right female breast: Secondary | ICD-10-CM

## 2016-04-07 MED ORDER — GABAPENTIN 100 MG PO CAPS: 100.0000 mg | ORAL_CAPSULE | Freq: Three times a day (TID) | ORAL | 1 refills | Status: DC

## 2016-04-07 NOTE — Telephone Encounter (Signed)
Pt called with c/o neuropathy in her hands and feet. After discussion with Dr. Jana Hakim and her complaints received order for gabapentin and to inform pt we will be cancelling her chemotherapy.  Gave instructions on gabapentin and cancelled her chemo for 12/5. Scheduled appt with Dr. Jana Hakim on 12/8. Confirmed date and time. Pt denies further needs at this time.

## 2016-04-09 ENCOUNTER — Encounter (HOSPITAL_COMMUNITY): Payer: Self-pay | Admitting: Emergency Medicine

## 2016-04-09 ENCOUNTER — Emergency Department (HOSPITAL_COMMUNITY)
Admission: EM | Admit: 2016-04-09 | Discharge: 2016-04-09 | Disposition: A | Discharge status: Home / Self Care | Payer: Managed Care, Other (non HMO) | Attending: Emergency Medicine | Admitting: Emergency Medicine

## 2016-04-09 ENCOUNTER — Other Ambulatory Visit: Payer: Self-pay

## 2016-04-09 DIAGNOSIS — C50411 Malignant neoplasm of upper-outer quadrant of right female breast: Secondary | ICD-10-CM

## 2016-04-09 DIAGNOSIS — I10 Essential (primary) hypertension: Secondary | ICD-10-CM | POA: Diagnosis not present

## 2016-04-09 DIAGNOSIS — T451X5A Adverse effect of antineoplastic and immunosuppressive drugs, initial encounter: Secondary | ICD-10-CM

## 2016-04-09 DIAGNOSIS — D649 Anemia, unspecified: Secondary | ICD-10-CM | POA: Diagnosis not present

## 2016-04-09 DIAGNOSIS — Z79899 Other long term (current) drug therapy: Secondary | ICD-10-CM | POA: Diagnosis not present

## 2016-04-09 DIAGNOSIS — R42 Dizziness and giddiness: Secondary | ICD-10-CM | POA: Diagnosis present

## 2016-04-09 DIAGNOSIS — D701 Agranulocytosis secondary to cancer chemotherapy: Secondary | ICD-10-CM | POA: Insufficient documentation

## 2016-04-09 DIAGNOSIS — Z171 Estrogen receptor negative status [ER-]: Secondary | ICD-10-CM | POA: Insufficient documentation

## 2016-04-09 DIAGNOSIS — E860 Dehydration: Secondary | ICD-10-CM | POA: Insufficient documentation

## 2016-04-09 LAB — URINALYSIS, ROUTINE W REFLEX MICROSCOPIC
Bilirubin Urine: NEGATIVE
GLUCOSE, UA: NEGATIVE mg/dL
Hgb urine dipstick: NEGATIVE
KETONES UR: NEGATIVE mg/dL
LEUKOCYTES UA: NEGATIVE
NITRITE: NEGATIVE
PROTEIN: NEGATIVE mg/dL
Specific Gravity, Urine: 1.011 (ref 1.005–1.030)
pH: 7 (ref 5.0–8.0)

## 2016-04-09 LAB — BASIC METABOLIC PANEL
Anion gap: 9 (ref 5–15)
BUN: 12 mg/dL (ref 6–20)
CALCIUM: 10 mg/dL (ref 8.9–10.3)
CO2: 29 mmol/L (ref 22–32)
CREATININE: 0.67 mg/dL (ref 0.44–1.00)
Chloride: 100 mmol/L — ABNORMAL LOW (ref 101–111)
Glucose, Bld: 95 mg/dL (ref 65–99)
Potassium: 3.3 mmol/L — ABNORMAL LOW (ref 3.5–5.1)
SODIUM: 138 mmol/L (ref 135–145)

## 2016-04-09 LAB — CBC
HCT: 33.7 % — ABNORMAL LOW (ref 36.0–46.0)
Hemoglobin: 10.9 g/dL — ABNORMAL LOW (ref 12.0–15.0)
MCH: 29.6 pg (ref 26.0–34.0)
MCHC: 32.3 g/dL (ref 30.0–36.0)
MCV: 91.6 fL (ref 78.0–100.0)
PLATELETS: 325 10*3/uL (ref 150–400)
RBC: 3.68 MIL/uL — AB (ref 3.87–5.11)
RDW: 15.4 % (ref 11.5–15.5)
WBC: 1.3 10*3/uL — AB (ref 4.0–10.5)

## 2016-04-09 LAB — CBG MONITORING, ED: GLUCOSE-CAPILLARY: 93 mg/dL (ref 65–99)

## 2016-04-09 MED ORDER — SODIUM CHLORIDE 0.9 % IV BOLUS (SEPSIS)
1000.0000 mL | Freq: Once | INTRAVENOUS | Status: AC
  Administered 2016-04-09: 1000 mL via INTRAVENOUS

## 2016-04-09 MED ORDER — FENTANYL CITRATE (PF) 100 MCG/2ML IJ SOLN
50.0000 ug | Freq: Once | INTRAMUSCULAR | Status: DC
Start: 2016-04-09 — End: 2016-04-09

## 2016-04-09 MED ORDER — ONDANSETRON HCL 4 MG/2ML IJ SOLN: 4.0000 mg | Freq: Once | INTRAMUSCULAR | Status: DC

## 2016-04-09 NOTE — ED Provider Notes (Signed)
[facility] DEPT Provider Note   CSN: [id number] Arrival date & time: 04/09/16  1154     History   Chief Complaint Chief Complaint  Patient presents with  . Medication Reaction  . Cancer  . Dizziness    HPI Lindsay [name] is a 50 y.o. female.  Pt presents today with dizziness.  She has a hx of breast cancer and last had chemo on 11/21.  She was started on neurontin on 11/30 after calling in complaining of tingling in her hands and feet.  Next chemo cancelled.  The pt's next appointment is with Dr. Jana Hakim on 12/8.   Last oncology note 03/29/16:   ASSESSMENT: 50 y.o. [place] woman status post right breast upper outer quadrant biopsy 12/14/2015 for a clinical T1c N0, stage IA  invasive ductal carcinoma, grade 3, triple negative, with an MIB-1 of 70%  (1) genetics testing negative for mutations within any of 20 genes on the Breast/Ovarian Cancer Panel through [employer]. Additionally, no VUS were found.   (2) neoadjuvant chemotherapy consisting of doxorubicin and cyclophosphamide in dose this fashion 4, completed 02/22/2016 followed by paclitaxel weekly 12, starting 03/08/2016             (a) cycle 4 of cyclophosphamide and doxorubicin held 1 week because of side effects  (3) breast conserving surgery to follow chemotherapy  (4) adjuvant radiation to follow surgery  (5) left thyroid nodule biopsy pending    Pt said she felt like she was going to pass out last night.  She c/o intermittent dizziness.  She did go to [employer] last night in downtown [place] and that made her sx worse.      Past Medical History:  Diagnosis Date  . Allergy    seasonal  . Anemia    prior to hysterectomy  . Arthritis   . Breast cancer of upper-outer quadrant of right female breast ([place]) 12/16/2015  . Depression    mild depression after death of daughter- no meds  . Hot flashes   . Hx of adenomatous colonic polyps 04/25/2014  . Hypertension      Patient Active Problem List   Diagnosis Date Noted  . Genetic testing 03/20/2016  . Family history of breast cancer in female 02/10/2016  . Breast cancer of upper-outer quadrant of right female breast ([place]) 12/16/2015  . Hx of adenomatous colonic polyps 04/25/2014  . Neck pain 11/06/2012    Past Surgical History:  Procedure Laterality Date  . ABDOMINAL HYSTERECTOMY    . CHOLECYSTECTOMY N/A 09/09/2015   Procedure: LAPAROSCOPIC CHOLECYSTECTOMY;  Surgeon: Ralene Ok, MD;  Location: [facility];  Service: General;  Laterality: N/A;  . COLONOSCOPY    . KNEE ARTHROSCOPY  2016  . PORTACATH PLACEMENT Right 12/29/2015   Procedure: INSERTION PORT-A-CATH WITH ULTRASOUND GUIDANCE;  Surgeon: Rolm Bookbinder, MD;  Location: [facility];  Service: General;  Laterality: Right;    OB History    No data available       Home Medications    Prior to Admission medications   Medication Sig Start Date End Date Taking? Authorizing Provider  EPINEPHrine (EPIPEN 2-PAK) 0.3 mg/0.3 mL IJ SOAJ injection Inject 0.3 mg into the muscle once as needed (anaphylaxis allergic reaction).  01/01/14  Yes Historical Provider, MD  fexofenadine (ALLEGRA) 180 MG tablet Take 180 mg by mouth daily.   Yes Historical Provider, MD  gabapentin (NEURONTIN) 100 MG capsule Take 1 capsule (100 mg total) by mouth 3 (three) times daily. Take 1  capsule (100mg ) in the morning and 1 capsule (100mg ) at lunchtime and 3 capsules (300mg ) at bedtime. 04/07/16  Yes Chauncey Cruel, MD  lisinopril-hydrochlorothiazide (PRINZIDE,ZESTORETIC) 20-12.5 MG per tablet Take 1 tablet by mouth daily.   Yes Historical Provider, MD  meloxicam (MOBIC) 15 MG tablet Take 1 tablet (15 mg total) by mouth daily as needed for pain. 10/05/15  Yes Dorie Rank, MD  tobramycin-dexamethasone [facility]) ophthalmic solution 1-2  drops each eye twice daily 03/08/16  Yes Chauncey Cruel, MD  Triamcinolone Acetonide (NASACORT ALLERGY 24HR NA) Place 1-2 sprays into the nose  daily as needed (allergies).   Yes Historical Provider, MD  ibuprofen (ADVIL,MOTRIN) 400 MG tablet Take 1 tablet (400 mg total) by mouth every 6 (six) hours as needed. Patient not taking: Reported on 04/09/2016 11/25/15   Montine Circle, PA-C  omeprazole (PRILOSEC) 40 MG capsule Take 1 capsule (40 mg total) by mouth daily. Patient not taking: Reported on 04/09/2016 01/26/16   Chauncey Cruel, MD  potassium chloride (KLOR-CON 10) 10 MEQ tablet Take 1 tablet (10 mEq total) by mouth daily. Patient not taking: Reported on 04/09/2016 01/01/16   Chauncey Cruel, MD  prochlorperazine (COMPAZINE) 10 MG tablet Take 1 tablet (10 mg total) by mouth every 6 (six) hours as needed for nausea or vomiting. Patient not taking: Reported on 04/09/2016 03/08/16   Chauncey Cruel, MD    Family History Family History  Problem Relation Age of Onset  . Hypertension Mother   . Breast cancer Maternal Grandfather   . Breast cancer Other   . Breast cancer Cousin   . Sudden death Neg Hx   . Diabetes Neg Hx   . Heart attack Neg Hx   . Hyperlipidemia Neg Hx   . Colon cancer Neg Hx   . Esophageal cancer Neg Hx   . Rectal cancer Neg Hx   . Stomach cancer Neg Hx     Social History Social History  Substance Use Topics  . Smoking status: Never Smoker  . Smokeless tobacco: Never Used  . Alcohol use No     Allergies   Percocet [oxycodone-acetaminophen]; Tramadol; Vicodin [hydrocodone-acetaminophen]; and Pollen extract   Review of Systems Review of Systems  Neurological: Positive for dizziness.  All other systems reviewed and are negative.    Physical Exam Updated Vital Signs BP 116/73 (BP Location: Left Arm)   Pulse 79   Temp 98.7 F (37.1 C) (Oral)   Resp 18   Ht 5\' 6"  (1.676 m)   Wt 205 lb (93 kg)   SpO2 100%   BMI 33.09 kg/m   Physical Exam  Constitutional: She is oriented to person, place, and time. She appears well-developed and well-nourished.  HENT:  Head: Normocephalic and  atraumatic.  Right Ear: External ear normal.  Left Ear: External ear normal.  Nose: Nose normal.  Mouth/Throat: Oropharynx is clear and moist.  Eyes: Conjunctivae and EOM are normal. Pupils are equal, round, and reactive to light.  Neck: Normal range of motion. Neck supple.  Cardiovascular: Normal rate, regular rhythm, normal heart sounds and intact distal pulses.   Pulmonary/Chest: Effort normal and breath sounds normal.  Abdominal: Soft. Bowel sounds are normal.  Musculoskeletal: Normal range of motion.  Neurological: She is alert and oriented to person, place, and time.  Skin: Skin is warm. Capillary refill takes less than 2 seconds.  Psychiatric: She has a normal mood and affect. Her behavior is normal. Judgment and thought content normal.  Nursing note and  vitals reviewed.    ED Treatments / Results  Labs (all labs ordered are listed, but only abnormal results are displayed) Labs Reviewed  BASIC METABOLIC PANEL - Abnormal; Notable for the following:       Result Value   Potassium 3.3 (*)    Chloride 100 (*)    All other components within normal limits  CBC - Abnormal; Notable for the following:    WBC 1.3 (*)    RBC 3.68 (*)    Hemoglobin 10.9 (*)    HCT 33.7 (*)    All other components within normal limits  URINALYSIS, ROUTINE W REFLEX MICROSCOPIC (NOT AT [facility]) - Abnormal; Notable for the following:    APPearance CLOUDY (*)    All other components within normal limits  CBG MONITORING, ED    EKG  EKG Interpretation None       Radiology No results found.  Procedures Procedures (including critical care time)  Medications Ordered in ED Medications  sodium chloride 0.9 % bolus 1,000 mL (0 mLs Intravenous Stopped 04/09/16 1456)     Initial Impression / Assessment and Plan / ED Course  I have reviewed the triage vital signs and the nursing notes.  Pertinent labs & imaging results that were available during my care of the patient were reviewed by me and  considered in my medical decision making (see chart for details).  Clinical Course     Pt is feeling much better after 1L NS.  She is able to get up and walk around without difficulty.  I think sx are due to dehydration from chemo and not neurontin.  She knows to return if worse and to f/u with Dr. Jana Hakim as scheduled.  Final Clinical Impressions(s) / ED Diagnoses   Final diagnoses:  Dehydration  Dizziness  Malignant neoplasm of upper-outer quadrant of right breast in female, estrogen receptor negative ([place])  Chemotherapy-induced neutropenia ([place])  Anemia, unspecified type    New Prescriptions New Prescriptions   No medications on file     Isla Pence, MD 04/09/16 [phone]

## 2016-04-09 NOTE — ED Triage Notes (Signed)
Pt reports medication reaction related to start of Gabapentin for neuropathy. Medication started Thursday. Pt verbalizes onset of intermittent dizziness and diaphoresis onset last night.

## 2016-04-09 NOTE — ED Notes (Signed)
Informed RN of abnormal lab value

## 2016-04-12 ENCOUNTER — Other Ambulatory Visit: Payer: Managed Care, Other (non HMO)

## 2016-04-12 ENCOUNTER — Ambulatory Visit: Payer: Managed Care, Other (non HMO)

## 2016-04-13 ENCOUNTER — Other Ambulatory Visit: Payer: Self-pay | Admitting: Radiology

## 2016-04-14 ENCOUNTER — Ambulatory Visit (HOSPITAL_COMMUNITY)
Admission: RE | Admit: 2016-04-14 | Discharge: 2016-04-14 | Disposition: A | Discharge status: Home / Self Care | Payer: Managed Care, Other (non HMO) | Source: Ambulatory Visit | Attending: Oncology | Admitting: Oncology

## 2016-04-14 DIAGNOSIS — Z171 Estrogen receptor negative status [ER-]: Secondary | ICD-10-CM | POA: Insufficient documentation

## 2016-04-14 DIAGNOSIS — C50411 Malignant neoplasm of upper-outer quadrant of right female breast: Secondary | ICD-10-CM | POA: Insufficient documentation

## 2016-04-14 DIAGNOSIS — E041 Nontoxic single thyroid nodule: Secondary | ICD-10-CM | POA: Diagnosis not present

## 2016-04-14 MED ORDER — LIDOCAINE HCL (PF) 1 % IJ SOLN
INTRAMUSCULAR | Status: AC
  Filled 2016-04-14: qty 10

## 2016-04-14 NOTE — Procedures (Signed)
   US guided Left thyroid nodule bx Thyroid nodule 4.5 cm  25 g x 4  Tolerated well

## 2016-04-15 ENCOUNTER — Encounter: Payer: Self-pay | Admitting: *Deleted

## 2016-04-15 ENCOUNTER — Ambulatory Visit (HOSPITAL_BASED_OUTPATIENT_CLINIC_OR_DEPARTMENT_OTHER): Payer: Managed Care, Other (non HMO) | Admitting: Oncology

## 2016-04-15 ENCOUNTER — Other Ambulatory Visit (HOSPITAL_BASED_OUTPATIENT_CLINIC_OR_DEPARTMENT_OTHER): Payer: Managed Care, Other (non HMO)

## 2016-04-15 VITALS — BP 119/73 | HR 85 | Temp 97.6°F | Resp 18 | Ht 66.0 in | Wt 203.8 lb

## 2016-04-15 DIAGNOSIS — C50411 Malignant neoplasm of upper-outer quadrant of right female breast: Secondary | ICD-10-CM

## 2016-04-15 DIAGNOSIS — Z171 Estrogen receptor negative status [ER-]: Secondary | ICD-10-CM

## 2016-04-15 LAB — CBC WITH DIFFERENTIAL/PLATELET
BASO%: 0.7 % (ref 0.0–2.0)
Basophils Absolute: 0 10*3/uL (ref 0.0–0.1)
EOS ABS: 0 10*3/uL (ref 0.0–0.5)
EOS%: 0.7 % (ref 0.0–7.0)
HCT: 34.2 % — ABNORMAL LOW (ref 34.8–46.6)
HGB: 11.1 g/dL — ABNORMAL LOW (ref 11.6–15.9)
LYMPH%: 26.2 % (ref 14.0–49.7)
MCH: 29.6 pg (ref 25.1–34.0)
MCHC: 32.5 g/dL (ref 31.5–36.0)
MCV: 91 fL (ref 79.5–101.0)
MONO#: 0.3 10*3/uL (ref 0.1–0.9)
MONO%: 12.4 % (ref 0.0–14.0)
NEUT%: 60 % (ref 38.4–76.8)
NEUTROS ABS: 1.6 10*3/uL (ref 1.5–6.5)
PLATELETS: 371 10*3/uL (ref 145–400)
RBC: 3.76 10*6/uL (ref 3.70–5.45)
RDW: 16.7 % — ABNORMAL HIGH (ref 11.2–14.5)
WBC: 2.7 10*3/uL — AB (ref 3.9–10.3)
lymph#: 0.7 10*3/uL — ABNORMAL LOW (ref 0.9–3.3)

## 2016-04-15 LAB — COMPREHENSIVE METABOLIC PANEL
ALT: 40 U/L (ref 0–55)
ANION GAP: 10 meq/L (ref 3–11)
AST: 36 U/L — ABNORMAL HIGH (ref 5–34)
Albumin: 4.1 g/dL (ref 3.5–5.0)
Alkaline Phosphatase: 48 U/L (ref 40–150)
BILIRUBIN TOTAL: 0.9 mg/dL (ref 0.20–1.20)
BUN: 15.9 mg/dL (ref 7.0–26.0)
CHLORIDE: 102 meq/L (ref 98–109)
CO2: 29 meq/L (ref 22–29)
Calcium: 10.5 mg/dL — ABNORMAL HIGH (ref 8.4–10.4)
Creatinine: 0.8 mg/dL (ref 0.6–1.1)
GLUCOSE: 90 mg/dL (ref 70–140)
POTASSIUM: 3.3 meq/L — AB (ref 3.5–5.1)
SODIUM: 141 meq/L (ref 136–145)
TOTAL PROTEIN: 7.9 g/dL (ref 6.4–8.3)

## 2016-04-15 NOTE — Progress Notes (Signed)
[facility]  Telephone:(336) [phone] [phone]) [phone]     ID: Lindsay [name] DOB: 1965-12-14  MR#: [id number]  FMB#:[id number]  Patient Care Team: No Pcp Per Patient as PCP - General (General Practice) Rolm Bookbinder, MD as Consulting Physician (General Surgery) Chauncey Cruel, MD as Consulting Physician (Oncology) Gery Pray, MD as Consulting Physician (Radiation Oncology) Newton Pigg, MD as Consulting Physician (Obstetrics and Gynecology) Dorna Leitz, MD as Consulting Physician (Orthopedic Surgery) OTHER MD:  CHIEF COMPLAINT: Invasive breast cancer  CURRENT TREATMENT: Awaiting definitive surgery   BREAST CANCER HISTORY: From the original intake note:  Lindsay [name] had routine screening mammography with tomography at [facility] 12/04/2015. There was a new mass measuring 1.5 cm at the 11:00 position of the right breast. Right breast ultrasonography 12/10/2015 confirmed a 1.9 cm irregular mass in the upper-outer quadrant of the right breast posteriorly. There was a right axillary lymph node with focal cortical thickening.  On 12/14/2015 Lindsay [name] underwent biopsy of the right breast mass at 9:00 position as well as a suspicious lymph node. A separate area in the right breast 10 cm from the nipple was also biopsied biopsied. The final pathology ([id number]) showed the additional area and the lymph node to be benign. The 9:00 mass however was positive for invasive ductal carcinoma, grade 3, estrogen receptor and progesterone receptor negative, with an MIB-1 of 70%, and HER-2 nonamplified with a signals ratio 1.44, and the number per cell 2.70.  Her subsequent history is as detailed below  INTERVAL HISTORY: Lindsay [name] returns today for follow-up of her estrogen receptor negative breast cancer accompanied by a friend. After she received her fifth cycle of weekly paclitaxel, she developed significant neuropathy. Her treatments accordingly have been stopped. She has been scheduled for  an MRI next week and will be seeing her surgeon to plan definitive surgery  REVIEW OF SYSTEMS: Lindsay [name] has significant pain in her feet to the point that she was barely able to walk. She could not put on her shoes. That now has completely resolved, and she is walking normally with no discomfort in her feet. She was also having significant pain in her hands. That is now much better only affecting the tips of her fingers. She is not dropping things. She is able to button a shirt if she needs to. We called and gabapentin for the symptoms and she took some doses but it made her very dizzy so she stopped it. Aside from these issues, she has some blurred vision and altered taste from her chemotherapy. Her energy is better. A detailed review of systems today was otherwise stable  PAST MEDICAL HISTORY: Past Medical History:  Diagnosis Date  . Allergy    seasonal  . Anemia    prior to hysterectomy  . Arthritis   . Breast cancer of upper-outer quadrant of right female breast ([place]) 12/16/2015  . Depression    mild depression after death of daughter- no meds  . Hot flashes   . Hx of adenomatous colonic polyps 04/25/2014  . Hypertension     PAST SURGICAL HISTORY: Past Surgical History:  Procedure Laterality Date  . ABDOMINAL HYSTERECTOMY    . CHOLECYSTECTOMY N/A 09/09/2015   Procedure: LAPAROSCOPIC CHOLECYSTECTOMY;  Surgeon: Ralene Ok, MD;  Location: [facility];  Service: General;  Laterality: N/A;  . COLONOSCOPY    . KNEE ARTHROSCOPY  2016  . PORTACATH PLACEMENT Right 12/29/2015   Procedure: INSERTION PORT-A-CATH WITH ULTRASOUND GUIDANCE;  Surgeon: Rolm Bookbinder, MD;  Location: [facility];  Service: General;  Laterality: Right;    FAMILY HISTORY Family History  Problem Relation Age of Onset  . Hypertension Mother   . Breast cancer Maternal Grandfather   . Breast cancer Other   . Breast cancer Cousin   . Sudden death Neg Hx   . Diabetes Neg Hx   . Heart attack Neg Hx   . Hyperlipidemia Neg Hx    . Colon cancer Neg Hx   . Esophageal cancer Neg Hx   . Rectal cancer Neg Hx   . Stomach cancer Neg Hx   The patient's father was murdered at age 52. The patient's mother is 34 years old as of August 2017. The patient has one brother, 2 sisters. A cousin was diagnosed with left breast cancer at age 62. A maternal aunt and a maternal grandmother were also diagnosed with breast cancer at age 3 and 71 respectively.  GYNECOLOGIC HISTORY:  No LMP recorded. Patient has had a hysterectomy.  Menarche age 35, first live birth age 10, the patient is GX P3. She is status post hysterectomy without salpingo-oophorectomy. She did not take hormone replacement. She used oral contraceptives remotely without complications.  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 Her husband [name] Delhi is a news and record Lexicographer. Son [name] lives in [place] and is a Games developer. Son [name] lives in [place] and is a Architectural technologist. The patient had a daughter who died at the age of 51.    ADVANCED DIRECTIVES: Not in place   HEALTH MAINTENANCE: Social History  Substance Use Topics  . Smoking status: Never Smoker  . Smokeless tobacco: Never Used  . Alcohol use No     Colonoscopy: 2016  PAP:  Bone density: Never   Allergies  Allergen Reactions  . Percocet [Oxycodone-Acetaminophen] Diarrhea and Nausea And Vomiting  . Tramadol Nausea And Vomiting  . Vicodin [Hydrocodone-Acetaminophen] Nausea And Vomiting  . Pollen Extract Other (See Comments)    Runny nose, itchy eyes and sneezing due to seasonal allergies.    Current Outpatient Prescriptions  Medication Sig Dispense Refill  . EPINEPHrine (EPIPEN 2-PAK) 0.3 mg/0.3 mL IJ SOAJ injection Inject 0.3 mg into the muscle once as needed (anaphylaxis allergic reaction).     . fexofenadine (ALLEGRA) 180 MG tablet Take 180 mg by mouth daily.    Marland Kitchen ibuprofen (ADVIL,MOTRIN) 400 MG tablet Take 1 tablet (400 mg total)  by mouth every 6 (six) hours as needed. (Patient not taking: Reported on 04/09/2016) 30 tablet 0  . lisinopril-hydrochlorothiazide (PRINZIDE,ZESTORETIC) 20-12.5 MG per tablet Take 1 tablet by mouth daily.    . meloxicam (MOBIC) 15 MG tablet Take 1 tablet (15 mg total) by mouth daily as needed for pain. 30 tablet 0  . tobramycin-dexamethasone (TOBRADEX) ophthalmic solution 1-2  drops each eye twice daily 5 mL 0  . Triamcinolone Acetonide (NASACORT ALLERGY 24HR NA) Place 1-2 sprays into the nose daily as needed (allergies).     No current facility-administered medications for this visit.     OBJECTIVE: Middle-aged African-American woman In no acute distress Vitals:   04/15/16 1228  BP: 119/73  Pulse: 85  Resp: 18  Temp: 97.6 F (36.4 C)     Body mass index is 32.89 kg/m.    ECOG FS:1 - Symptomatic but completely ambulatory  Sclerae unicteric, pupils round and equal Oropharynx clear and moist-- no thrush or other lesions No cervical or supraclavicular adenopathy Lungs no rales or rhonchi Heart  regular rate and rhythm Abd soft, nontender, positive bowel sounds MSK no focal spinal tenderness, no upper extremity lymphedema Neuro: nonfocal, well oriented, appropriate affect Breasts: I don't palpate a mass in the right breast. There are no skin or nipple changes of concern. The right axilla is benign. Left breast is unremarkable.    LAB RESULTS:  CMP     Component Value Date/Time   NA 141 04/15/2016 1158   K 3.3 (L) 04/15/2016 1158   CL 100 (L) 04/09/2016 1310   CO2 29 04/15/2016 1158   GLUCOSE 90 04/15/2016 1158   BUN 15.9 04/15/2016 1158   CREATININE 0.8 04/15/2016 1158   CALCIUM 10.5 (H) 04/15/2016 1158   PROT 7.9 04/15/2016 1158   ALBUMIN 4.1 04/15/2016 1158   AST 36 (H) 04/15/2016 1158   ALT 40 04/15/2016 1158   ALKPHOS 48 04/15/2016 1158   BILITOT 0.90 04/15/2016 1158   GFRNONAA >60 04/09/2016 1310   GFRAA >60 04/09/2016 1310    INo results found for: SPEP,  UPEP  Lab Results  Component Value Date   WBC 2.7 (L) 04/15/2016   NEUTROABS 1.6 04/15/2016   HGB 11.1 (L) 04/15/2016   HCT 34.2 (L) 04/15/2016   MCV 91.0 04/15/2016   PLT 371 04/15/2016      Chemistry      Component Value Date/Time   NA 141 04/15/2016 1158   K 3.3 (L) 04/15/2016 1158   CL 100 (L) 04/09/2016 1310   CO2 29 04/15/2016 1158   BUN 15.9 04/15/2016 1158   CREATININE 0.8 04/15/2016 1158      Component Value Date/Time   CALCIUM 10.5 (H) 04/15/2016 1158   ALKPHOS 48 04/15/2016 1158   AST 36 (H) 04/15/2016 1158   ALT 40 04/15/2016 1158   BILITOT 0.90 04/15/2016 1158       No results found for: LABCA2  No components found for: JIRCV893  No results for input(s): INR in the last 168 hours.  Urinalysis    Component Value Date/Time   COLORURINE YELLOW 04/09/2016 1455   APPEARANCEUR CLOUDY (A) 04/09/2016 1455   LABSPEC 1.011 04/09/2016 1455   PHURINE 7.0 04/09/2016 1455   GLUCOSEU NEGATIVE 04/09/2016 1455   HGBUR NEGATIVE 04/09/2016 1455   BILIRUBINUR NEGATIVE 04/09/2016 1455   KETONESUR NEGATIVE 04/09/2016 1455   PROTEINUR NEGATIVE 04/09/2016 1455   UROBILINOGEN 1.0 03/03/2015 1240   NITRITE NEGATIVE 04/09/2016 1455   LEUKOCYTESUR NEGATIVE 04/09/2016 1455     STUDIES: US Biopsy  Result Date: 2016/04/18 INDICATION: Left thyroid nodule 4.5 cm EXAM: ULTRASOUND GUIDED FINE NEEDLE ASPIRATION OF INDETERMINATE THYROID NODULE COMPARISON:  US Thyroid 03/11/2016 MEDICATIONS: 8 cc 1% lidocaine COMPLICATIONS: None immediate. TECHNIQUE: Informed written consent was obtained from the patient after a discussion of the risks, benefits and alternatives to treatment. Questions regarding the procedure were encouraged and answered. A timeout was performed prior to the initiation of the procedure. Pre-procedural ultrasound scanning demonstrated unchanged size and appearance of the indeterminate nodule within the left thyroid The procedure was planned. The neck was prepped in  the usual sterile fashion, and a sterile drape was applied covering the operative field. A timeout was performed prior to the initiation of the procedure. Local anesthesia was provided with 1% lidocaine. Under direct ultrasound guidance, 4 FNA biopsies were performed of the left tyroid nodule with a 25 gauge needle. Multiple ultrasound images were saved for procedural documentation purposes. The samples were prepared and submitted to pathology. Limited post procedural scanning was negative for hematoma or  additional complication. Dressings were placed. The patient tolerated the above procedures procedure well without immediate postprocedural complication. FINDINGS: FINDINGS Nodule reference number based on prior diagnostic ultrasound: 3 Maximum size:  4.5 cm Location: Left  ;  Mid ACR TI-RADS total points: 3 ACR TI-RADS risk category:  TR3 (3 points) Prior biopsy:  No Reason for biopsy: meets ACR TI-RADS criteria Ultrasound imaging confirms appropriate placement of the needles within the thyroid nodule. IMPRESSION: Technically successful ultrasound guided fine needle aspiration of left thyroid nodule. Read by Lavonia Drafts [facility] Electronically Signed   By: Corrie Mckusick D.O.   On: 04/14/2016 14:25    ELIGIBLE FOR AVAILABLE RESEARCH PROTOCOL: considered the PREVENT trial: decided against it  ASSESSMENT: 50 y.o. [place] woman status post right breast upper outer quadrant biopsy 12/14/2015 for a clinical T1c N0, stage IA  invasive ductal carcinoma, grade 3, triple negative, with an MIB-1 of 70%  (1) genetics testing negative for mutations within any of 20 genes on the Breast/Ovarian Cancer Panel through [employer].  Additionally, no VUS were found.   (2) neoadjuvant chemotherapy consisting of doxorubicin and cyclophosphamide in dose this fashion 4, completed 02/22/2016, followed by paclitaxel weekly 5 (of 12 treatments planned), starting 03/08/2016, stopped 04/05/2016 after cycle 5 because of  neuropathy  (a) cycle 4 of cyclophosphamide and doxorubicin held 1 week because of side effects  (3) breast conserving surgery to follow chemotherapy  (4) adjuvant radiation to follow surgery  (5) left thyroid nodule biopsy 04/14/2016 rate as [facility] 1   PLAN: Lindsay [name] is done with chemotherapy and she was able to tolerate the major portion of her presurgical treatment. She is now ready to proceed to definitive surgery.  I'm hopeful she will get good news from her MRI next week and even better from her final surgical pathology.  We discussed the biopsy from her thyroid, which is essentially benign although there was scant material. We will consider repeating an ultrasound of the thyroid in a year.  Otherwise the plan is for her to proceed to radiation after surgery. I expect she should be getting done late January and I will see her around that time when I hope to be able to start long-term follow-up  She knows to call for any problems that may develop before her next visit here.     :Chauncey Cruel, MD   04/15/2016 5:12 PM Medical Oncology and Hematology [facility] [place], [place] [address] Tel. [phone]    Fax. [phone] you

## 2016-04-17 ENCOUNTER — Ambulatory Visit
Admission: RE | Admit: 2016-04-17 | Discharge: 2016-04-17 | Disposition: A | Discharge status: Home / Self Care | Payer: Managed Care, Other (non HMO) | Source: Ambulatory Visit | Attending: Oncology | Admitting: Oncology

## 2016-04-17 DIAGNOSIS — C50411 Malignant neoplasm of upper-outer quadrant of right female breast: Secondary | ICD-10-CM

## 2016-04-17 MED ORDER — GADOBENATE DIMEGLUMINE 529 MG/ML IV SOLN
19.0000 mL | Freq: Once | INTRAVENOUS | Status: AC | PRN
  Administered 2016-04-17: 19 mL via INTRAVENOUS

## 2016-04-19 ENCOUNTER — Ambulatory Visit: Payer: Managed Care, Other (non HMO)

## 2016-04-19 ENCOUNTER — Other Ambulatory Visit: Payer: Managed Care, Other (non HMO)

## 2016-04-19 ENCOUNTER — Ambulatory Visit: Payer: Managed Care, Other (non HMO) | Admitting: Oncology

## 2016-04-20 ENCOUNTER — Other Ambulatory Visit: Payer: Self-pay | Admitting: General Surgery

## 2016-04-21 ENCOUNTER — Other Ambulatory Visit: Payer: Self-pay | Admitting: General Surgery

## 2016-04-21 DIAGNOSIS — C50411 Malignant neoplasm of upper-outer quadrant of right female breast: Secondary | ICD-10-CM

## 2016-04-21 DIAGNOSIS — Z171 Estrogen receptor negative status [ER-]: Principal | ICD-10-CM

## 2016-04-22 ENCOUNTER — Encounter: Payer: Self-pay | Admitting: Radiation Oncology

## 2016-04-26 ENCOUNTER — Ambulatory Visit: Payer: Managed Care, Other (non HMO)

## 2016-04-26 ENCOUNTER — Other Ambulatory Visit: Payer: Managed Care, Other (non HMO)

## 2016-04-27 ENCOUNTER — Encounter (HOSPITAL_BASED_OUTPATIENT_CLINIC_OR_DEPARTMENT_OTHER): Payer: Self-pay | Admitting: *Deleted

## 2016-04-27 NOTE — Progress Notes (Signed)
Bring all medications. Coming Wednesday to pick up Boost drink.

## 2016-05-03 ENCOUNTER — Ambulatory Visit: Payer: Managed Care, Other (non HMO)

## 2016-05-03 ENCOUNTER — Other Ambulatory Visit: Payer: Managed Care, Other (non HMO)

## 2016-05-04 NOTE — Progress Notes (Signed)
Pt in to pick up Boost Breeze, instructions reviewed. 

## 2016-05-05 ENCOUNTER — Ambulatory Visit (HOSPITAL_BASED_OUTPATIENT_CLINIC_OR_DEPARTMENT_OTHER): Payer: Managed Care, Other (non HMO) | Admitting: Anesthesiology

## 2016-05-05 ENCOUNTER — Ambulatory Visit (HOSPITAL_COMMUNITY)
Admission: RE | Admit: 2016-05-05 | Discharge: 2016-05-05 | Disposition: A | Discharge status: Home / Self Care | Payer: Managed Care, Other (non HMO) | Source: Ambulatory Visit | Attending: General Surgery | Admitting: General Surgery

## 2016-05-05 ENCOUNTER — Ambulatory Visit (HOSPITAL_BASED_OUTPATIENT_CLINIC_OR_DEPARTMENT_OTHER)
Admission: RE | Admit: 2016-05-05 | Discharge: 2016-05-05 | Disposition: A | Discharge status: Home / Self Care | Payer: Managed Care, Other (non HMO) | Source: Ambulatory Visit | Attending: General Surgery | Admitting: General Surgery

## 2016-05-05 ENCOUNTER — Encounter (HOSPITAL_BASED_OUTPATIENT_CLINIC_OR_DEPARTMENT_OTHER)
Admission: RE | Disposition: A | Discharge status: Home / Self Care | Payer: Self-pay | Source: Ambulatory Visit | Attending: General Surgery

## 2016-05-05 ENCOUNTER — Encounter (HOSPITAL_BASED_OUTPATIENT_CLINIC_OR_DEPARTMENT_OTHER): Payer: Self-pay | Admitting: Certified Registered"

## 2016-05-05 DIAGNOSIS — Z9221 Personal history of antineoplastic chemotherapy: Secondary | ICD-10-CM | POA: Insufficient documentation

## 2016-05-05 DIAGNOSIS — Z90710 Acquired absence of both cervix and uterus: Secondary | ICD-10-CM | POA: Insufficient documentation

## 2016-05-05 DIAGNOSIS — M1990 Unspecified osteoarthritis, unspecified site: Secondary | ICD-10-CM | POA: Diagnosis not present

## 2016-05-05 DIAGNOSIS — C50411 Malignant neoplasm of upper-outer quadrant of right female breast: Secondary | ICD-10-CM | POA: Insufficient documentation

## 2016-05-05 DIAGNOSIS — I739 Peripheral vascular disease, unspecified: Secondary | ICD-10-CM | POA: Insufficient documentation

## 2016-05-05 DIAGNOSIS — G629 Polyneuropathy, unspecified: Secondary | ICD-10-CM | POA: Diagnosis not present

## 2016-05-05 DIAGNOSIS — I10 Essential (primary) hypertension: Secondary | ICD-10-CM | POA: Insufficient documentation

## 2016-05-05 DIAGNOSIS — C50911 Malignant neoplasm of unspecified site of right female breast: Secondary | ICD-10-CM | POA: Diagnosis present

## 2016-05-05 DIAGNOSIS — Z171 Estrogen receptor negative status [ER-]: Principal | ICD-10-CM

## 2016-05-05 HISTORY — PX: PERIPHERAL VASCULAR CATHETERIZATION: SHX172C

## 2016-05-05 HISTORY — PX: BREAST LUMPECTOMY WITH RADIOACTIVE SEED AND SENTINEL LYMPH NODE BIOPSY: SHX6550

## 2016-05-05 SURGERY — BREAST LUMPECTOMY WITH RADIOACTIVE SEED AND SENTINEL LYMPH NODE BIOPSY
Anesthesia: Regional | Site: Chest | Laterality: Right

## 2016-05-05 MED ORDER — CEFAZOLIN SODIUM-DEXTROSE 2-4 GM/100ML-% IV SOLN
2.0000 g | INTRAVENOUS | Status: AC
  Administered 2016-05-05: 2 g via INTRAVENOUS

## 2016-05-05 MED ORDER — MIDAZOLAM HCL 2 MG/2ML IJ SOLN
1.0000 mg | INTRAMUSCULAR | Status: DC | PRN
  Administered 2016-05-05: 0.5 mg via INTRAVENOUS
  Administered 2016-05-05: 2 mg via INTRAVENOUS

## 2016-05-05 MED ORDER — DEXAMETHASONE SODIUM PHOSPHATE 10 MG/ML IJ SOLN
INTRAMUSCULAR | Status: AC
Start: 2016-05-05 — End: 2016-05-05
  Filled 2016-05-05: qty 1

## 2016-05-05 MED ORDER — METHYLENE BLUE 0.5 % INJ SOLN
INTRAVENOUS | Status: AC
  Filled 2016-05-05: qty 10

## 2016-05-05 MED ORDER — DEXAMETHASONE SODIUM PHOSPHATE 4 MG/ML IJ SOLN
INTRAMUSCULAR | Status: DC | PRN
  Administered 2016-05-05: 10 mg via INTRAVENOUS

## 2016-05-05 MED ORDER — PROMETHAZINE HCL 12.5 MG PO TABS: 12.5000 mg | ORAL_TABLET | Freq: Four times a day (QID) | ORAL | 0 refills | Status: DC | PRN

## 2016-05-05 MED ORDER — CELECOXIB 200 MG PO CAPS
200.0000 mg | ORAL_CAPSULE | ORAL | Status: AC
Start: 2016-05-05 — End: 2016-05-05
  Administered 2016-05-05: 200 mg via ORAL

## 2016-05-05 MED ORDER — FENTANYL CITRATE (PF) 100 MCG/2ML IJ SOLN
25.0000 ug | INTRAMUSCULAR | Status: DC | PRN
  Administered 2016-05-05 (×2): 50 ug via INTRAVENOUS

## 2016-05-05 MED ORDER — MIDAZOLAM HCL 2 MG/2ML IJ SOLN
INTRAMUSCULAR | Status: AC
  Filled 2016-05-05: qty 2

## 2016-05-05 MED ORDER — ACETAMINOPHEN 500 MG PO TABS
1000.0000 mg | ORAL_TABLET | ORAL | Status: AC
  Administered 2016-05-05: 1000 mg via ORAL

## 2016-05-05 MED ORDER — GABAPENTIN 300 MG PO CAPS
ORAL_CAPSULE | ORAL | Status: AC
  Filled 2016-05-05: qty 1

## 2016-05-05 MED ORDER — ONDANSETRON HCL 4 MG/2ML IJ SOLN
INTRAMUSCULAR | Status: DC | PRN
  Administered 2016-05-05: 4 mg via INTRAVENOUS

## 2016-05-05 MED ORDER — GABAPENTIN 300 MG PO CAPS: 300.0000 mg | ORAL_CAPSULE | ORAL | Status: DC

## 2016-05-05 MED ORDER — PROPOFOL 10 MG/ML IV BOLUS
INTRAVENOUS | Status: DC | PRN
  Administered 2016-05-05: 30 mg via INTRAVENOUS
  Administered 2016-05-05: 200 mg via INTRAVENOUS

## 2016-05-05 MED ORDER — FENTANYL CITRATE (PF) 100 MCG/2ML IJ SOLN
INTRAMUSCULAR | Status: AC
  Filled 2016-05-05: qty 2

## 2016-05-05 MED ORDER — LIDOCAINE 2% (20 MG/ML) 5 ML SYRINGE
INTRAMUSCULAR | Status: DC | PRN
  Administered 2016-05-05: 100 mg via INTRAVENOUS

## 2016-05-05 MED ORDER — ACETAMINOPHEN 500 MG PO TABS: 1000.0000 mg | ORAL_TABLET | ORAL | Status: DC

## 2016-05-05 MED ORDER — HYDROMORPHONE HCL 4 MG PO TABS: 2.0000 mg | ORAL_TABLET | Freq: Four times a day (QID) | ORAL | 0 refills | Status: DC | PRN

## 2016-05-05 MED ORDER — BUPIVACAINE HCL (PF) 0.25 % IJ SOLN
INTRAMUSCULAR | Status: DC | PRN
  Administered 2016-05-05: 10 mL

## 2016-05-05 MED ORDER — PROPOFOL 10 MG/ML IV BOLUS
INTRAVENOUS | Status: AC
  Filled 2016-05-05: qty 20

## 2016-05-05 MED ORDER — ACETAMINOPHEN 500 MG PO TABS
ORAL_TABLET | ORAL | Status: AC
  Filled 2016-05-05: qty 2

## 2016-05-05 MED ORDER — SODIUM CHLORIDE 0.9 % IJ SOLN
INTRAMUSCULAR | Status: AC
  Filled 2016-05-05: qty 10

## 2016-05-05 MED ORDER — CEFAZOLIN SODIUM-DEXTROSE 2-4 GM/100ML-% IV SOLN
INTRAVENOUS | Status: AC
  Filled 2016-05-05: qty 100

## 2016-05-05 MED ORDER — FENTANYL CITRATE (PF) 100 MCG/2ML IJ SOLN
50.0000 ug | INTRAMUSCULAR | Status: AC | PRN
  Administered 2016-05-05 (×3): 50 ug via INTRAVENOUS

## 2016-05-05 MED ORDER — SCOPOLAMINE 1 MG/3DAYS TD PT72: 1.0000 | MEDICATED_PATCH | Freq: Once | TRANSDERMAL | Status: DC | PRN

## 2016-05-05 MED ORDER — LACTATED RINGERS IV SOLN
INTRAVENOUS | Status: DC
  Administered 2016-05-05 (×2): via INTRAVENOUS

## 2016-05-05 MED ORDER — CEFAZOLIN SODIUM-DEXTROSE 2-4 GM/100ML-% IV SOLN: 2.0000 g | INTRAVENOUS | Status: DC

## 2016-05-05 MED ORDER — CELECOXIB 200 MG PO CAPS
ORAL_CAPSULE | ORAL | Status: AC
  Filled 2016-05-05: qty 1

## 2016-05-05 MED ORDER — PROMETHAZINE HCL 25 MG/ML IJ SOLN: 6.2500 mg | INTRAMUSCULAR | Status: DC | PRN

## 2016-05-05 MED ORDER — METHYLENE BLUE 0.5 % INJ SOLN
INTRAVENOUS | Status: DC | PRN
  Administered 2016-05-05: 5 mL

## 2016-05-05 MED ORDER — LIDOCAINE 2% (20 MG/ML) 5 ML SYRINGE
INTRAMUSCULAR | Status: AC
  Filled 2016-05-05: qty 5

## 2016-05-05 MED ORDER — TECHNETIUM TC 99M SULFUR COLLOID FILTERED
1.0000 | Freq: Once | INTRAVENOUS | Status: AC | PRN
  Administered 2016-05-05: 1 via INTRADERMAL

## 2016-05-05 MED ORDER — ONDANSETRON HCL 4 MG/2ML IJ SOLN
INTRAMUSCULAR | Status: AC
  Filled 2016-05-05: qty 2

## 2016-05-05 SURGICAL SUPPLY — 62 items
ADH SKN CLS APL DERMABOND .7 (GAUZE/BANDAGES/DRESSINGS) ×2
BINDER BREAST LRG (GAUZE/BANDAGES/DRESSINGS) IMPLANT
BINDER BREAST MEDIUM (GAUZE/BANDAGES/DRESSINGS) IMPLANT
BINDER BREAST XLRG (GAUZE/BANDAGES/DRESSINGS) IMPLANT
BINDER BREAST XXLRG (GAUZE/BANDAGES/DRESSINGS) IMPLANT
BLADE SURG 15 STRL LF DISP TIS (BLADE) ×2 IMPLANT
BLADE SURG 15 STRL SS (BLADE) ×4
CANISTER SUC SOCK COL 7IN (MISCELLANEOUS) IMPLANT
CANISTER SUCT 1200ML W/VALVE (MISCELLANEOUS) IMPLANT
CHLORAPREP W/TINT 26ML (MISCELLANEOUS) ×4 IMPLANT
CLIP TI WIDE RED SMALL 6 (CLIP) ×4 IMPLANT
CLOSURE WOUND 1/2 X4 (GAUZE/BANDAGES/DRESSINGS) ×1
COVER BACK TABLE 60X90IN (DRAPES) ×4 IMPLANT
COVER MAYO STAND STRL (DRAPES) ×4 IMPLANT
COVER PROBE W GEL 5X96 (DRAPES) ×4 IMPLANT
DECANTER SPIKE VIAL GLASS SM (MISCELLANEOUS) IMPLANT
DERMABOND ADVANCED (GAUZE/BANDAGES/DRESSINGS) ×2
DERMABOND ADVANCED .7 DNX12 (GAUZE/BANDAGES/DRESSINGS) ×2 IMPLANT
DEVICE DUBIN W/COMP PLATE 8390 (MISCELLANEOUS) ×4 IMPLANT
DRAPE LAPAROSCOPIC ABDOMINAL (DRAPES) ×4 IMPLANT
DRAPE UTILITY XL STRL (DRAPES) ×4 IMPLANT
ELECT COATED BLADE 2.86 ST (ELECTRODE) ×4 IMPLANT
ELECT REM PT RETURN 9FT ADLT (ELECTROSURGICAL) ×4
ELECTRODE REM PT RTRN 9FT ADLT (ELECTROSURGICAL) ×2 IMPLANT
GLOVE BIO SURGEON STRL SZ 6.5 (GLOVE) ×1 IMPLANT
GLOVE BIO SURGEON STRL SZ7 (GLOVE) ×8 IMPLANT
GLOVE BIO SURGEONS STRL SZ 6.5 (GLOVE) ×1
GLOVE BIOGEL PI IND STRL 7.0 (GLOVE) IMPLANT
GLOVE BIOGEL PI IND STRL 7.5 (GLOVE) ×2 IMPLANT
GLOVE BIOGEL PI INDICATOR 7.0 (GLOVE) ×2
GLOVE BIOGEL PI INDICATOR 7.5 (GLOVE) ×2
GOWN STRL REUS W/ TWL LRG LVL3 (GOWN DISPOSABLE) ×4 IMPLANT
GOWN STRL REUS W/TWL LRG LVL3 (GOWN DISPOSABLE) ×8
HEMOSTAT ARISTA ABSORB 3G PWDR (MISCELLANEOUS) ×2 IMPLANT
ILLUMINATOR WAVEGUIDE N/F (MISCELLANEOUS) ×2 IMPLANT
KIT MARKER MARGIN INK (KITS) ×4 IMPLANT
LIGHT WAVEGUIDE WIDE FLAT (MISCELLANEOUS) IMPLANT
NDL HYPO 25X1 1.5 SAFETY (NEEDLE) ×2 IMPLANT
NDL SAFETY ECLIPSE 18X1.5 (NEEDLE) IMPLANT
NEEDLE HYPO 18GX1.5 SHARP (NEEDLE)
NEEDLE HYPO 25X1 1.5 SAFETY (NEEDLE) ×4 IMPLANT
NS IRRIG 1000ML POUR BTL (IV SOLUTION) IMPLANT
PACK BASIN DAY SURGERY FS (CUSTOM PROCEDURE TRAY) ×4 IMPLANT
PENCIL BUTTON HOLSTER BLD 10FT (ELECTRODE) ×4 IMPLANT
SLEEVE SCD COMPRESS KNEE MED (MISCELLANEOUS) ×4 IMPLANT
SPONGE LAP 4X18 X RAY DECT (DISPOSABLE) ×4 IMPLANT
STRIP CLOSURE SKIN 1/2X4 (GAUZE/BANDAGES/DRESSINGS) ×3 IMPLANT
SUT ETHILON 2 0 FS 18 (SUTURE) IMPLANT
SUT MNCRL AB 4-0 PS2 18 (SUTURE) ×4 IMPLANT
SUT MON AB 5-0 PS2 18 (SUTURE) IMPLANT
SUT SILK 2 0 SH (SUTURE) IMPLANT
SUT VIC AB 2-0 SH 27 (SUTURE) ×4
SUT VIC AB 2-0 SH 27XBRD (SUTURE) ×2 IMPLANT
SUT VIC AB 3-0 SH 27 (SUTURE) ×4
SUT VIC AB 3-0 SH 27X BRD (SUTURE) ×2 IMPLANT
SUT VIC AB 5-0 PS2 18 (SUTURE) IMPLANT
SYR CONTROL 10ML LL (SYRINGE) ×4 IMPLANT
TOWEL OR 17X24 6PK STRL BLUE (TOWEL DISPOSABLE) ×4 IMPLANT
TOWEL OR NON WOVEN STRL DISP B (DISPOSABLE) ×4 IMPLANT
TUBE CONNECTING 20'X1/4 (TUBING)
TUBE CONNECTING 20X1/4 (TUBING) IMPLANT
YANKAUER SUCT BULB TIP NO VENT (SUCTIONS) IMPLANT

## 2016-05-05 NOTE — Anesthesia Postprocedure Evaluation (Signed)
Anesthesia Post Note  Patient: Lindsay [name]  Procedure(s) Performed: Procedure(s) (LRB): BREAST LUMPECTOMY WITH RADIOACTIVE SEED AND SENTINEL LYMPH NODE BIOPSY (Right) PORTA CATH REMOVAL (Right)  Patient location during evaluation: PACU Anesthesia Type: General Level of consciousness: awake Pain management: pain level controlled Respiratory status: spontaneous breathing Cardiovascular status: stable Anesthetic complications: no       Last Vitals:  Vitals:   05/05/16 1230 05/05/16 1302  BP: 131/89 (!) 142/81  Pulse: 85 92  Resp: 15 18  Temp:  36.9 C    Last Pain:  Vitals:   05/05/16 1302  TempSrc:   PainSc: 4                  Nolyn Eilert

## 2016-05-05 NOTE — Discharge Instructions (Signed)
[facility] °Office Phone Number [phone] ° °POST OP INSTRUCTIONS ° °Always review your discharge instruction sheet given to you by the facility where your surgery was performed. ° °IF YOU HAVE DISABILITY OR FAMILY LEAVE FORMS, YOU MUST BRING THEM TO THE OFFICE FOR PROCESSING.  DO NOT GIVE THEM TO YOUR DOCTOR. ° °1. A prescription for pain medication may be given to you upon discharge.  Take your pain medication as prescribed, if needed.  If narcotic pain medicine is not needed, then you may take acetaminophen (Tylenol), naprosyn (Alleve) or ibuprofen (Advil) as needed. °2. Take your usually prescribed medications unless otherwise directed °3. If you need a refill on your pain medication, please contact your pharmacy.  They will contact our office to request authorization.  Prescriptions will not be filled after 5pm or on week-ends. °4. You should eat very light the first 24 hours after surgery, such as soup, crackers, pudding, etc.  Resume your normal diet the day after surgery. °5. Most patients will experience some swelling and bruising in the breast.  Ice packs and a good support bra will help.  Wear the breast binder provided or a sports bra for 72 hours day and night.  After that wear a sports bra during the day until you return to the office. Swelling and bruising can take several days to resolve.  °6. It is common to experience some constipation if taking pain medication after surgery.  Increasing fluid intake and taking a stool softener will usually help or prevent this problem from occurring.  A mild laxative (Milk of Magnesia or Miralax) should be taken according to package directions if there are no bowel movements after 48 hours. °7. Unless discharge instructions indicate otherwise, you may remove your bandages 48 hours after surgery and you may shower at that time.  You may have steri-strips (small skin tapes) in place directly over the incision.  These strips should be left on the  skin for 7-10 days and will come off on their own.  If your surgeon used skin glue on the incision, you may shower in 24 hours.  The glue will flake off over the next 2-3 weeks.  Any sutures or staples will be removed at the office during your follow-up visit. °8. ACTIVITIES:  You may resume regular daily activities (gradually increasing) beginning the next day.  Wearing a good support bra or sports bra minimizes pain and swelling.  You may have sexual intercourse when it is comfortable. °a. You may drive when you no longer are taking prescription pain medication, you can comfortably wear a seatbelt, and you can safely maneuver your car and apply brakes. °b. RETURN TO WORK:  ______________________________________________________________________________________ °9. You should see your doctor in the office for a follow-up appointment approximately two weeks after your surgery.  Your doctor’s nurse will typically make your follow-up appointment when she calls you with your pathology report.  Expect your pathology report 3-4 business days after your surgery.  You may call to check if you do not hear from us after three days. °10. OTHER INSTRUCTIONS: _______________________________________________________________________________________________ _____________________________________________________________________________________________________________________________________ °_____________________________________________________________________________________________________________________________________ °_____________________________________________________________________________________________________________________________________ ° °WHEN TO CALL DR WAKEFIELD: °1. Fever over 101.0 °2. Nausea and/or vomiting. °3. Extreme swelling or bruising. °4. Continued bleeding from incision. °5. Increased pain, redness, or drainage from the incision. ° °The clinic staff is available to answer your questions during regular  business hours.  Please don’t hesitate to call and ask to speak to one of the nurses for clinical concerns.  If   you have a medical emergency, go to the nearest emergency room or call 911.  A surgeon from [facility] is always on call at the hospital. ° °For further questions, please visit [email or web address] ° ° ° °Post Anesthesia Home Care Instructions ° °Activity: °Get plenty of rest for the remainder of the day. A responsible adult should stay with you for 24 hours following the procedure.  °For the next 24 hours, DO NOT: °-Drive a car °-Operate machinery °-Drink alcoholic beverages °-Take any medication unless instructed by your physician °-Make any legal decisions or sign important papers. ° °Meals: °Start with liquid foods such as gelatin or soup. Progress to regular foods as tolerated. Avoid greasy, spicy, heavy foods. If nausea and/or vomiting occur, drink only clear liquids until the nausea and/or vomiting subsides. Call your physician if vomiting continues. ° °Special Instructions/Symptoms: °Your throat may feel dry or sore from the anesthesia or the breathing tube placed in your throat during surgery. If this causes discomfort, gargle with warm salt water. The discomfort should disappear within 24 hours. ° °If you had a scopolamine patch placed behind your ear for the management of post- operative nausea and/or vomiting: ° °1. The medication in the patch is effective for 72 hours, after which it should be removed.  Wrap patch in a tissue and discard in the trash. Wash hands thoroughly with soap and water. °2. You may remove the patch earlier than 72 hours if you experience unpleasant side effects which may include dry mouth, dizziness or visual disturbances. °3. Avoid touching the patch. Wash your hands with soap and water after contact with the patch. °  ° °

## 2016-05-05 NOTE — Anesthesia Procedure Notes (Signed)
Anesthesia Regional Block:  Pectoralis block  Pre-Anesthetic Checklist: ,, timeout performed, Correct Patient, Correct Site, Correct Laterality, Correct Procedure, Correct Position, site marked, Risks and benefits discussed,  Surgical consent,  Pre-op evaluation,  At surgeon's request and post-op pain management  Laterality: Right  Prep: chloraprep       Needles:   Needle Type: Stimulator Needle - 40          Additional Needles:  Procedures: Doppler guided and ultrasound guided (picture in chart) Pectoralis block Narrative:  Start time: 05/05/2016 8:25 AM End time: 05/05/2016 8:40 AM Injection made incrementally with aspirations every 5 mL.  Performed by: Personally  Anesthesiologist: Belinda Block

## 2016-05-05 NOTE — Progress Notes (Signed)
Assisted Dr. Edwards with right, ultrasound guided, pectoralis block. Side rails up, monitors on throughout procedure. See vital signs in flow sheet. Tolerated Procedure well. 

## 2016-05-05 NOTE — Anesthesia Procedure Notes (Signed)
Procedure Name: LMA Insertion Date/Time: 05/05/2016 9:58 AM Performed by: Baxter Flattery Pre-anesthesia Checklist: Patient identified, Emergency Drugs available, Suction available and Patient being monitored Patient Re-evaluated:Patient Re-evaluated prior to inductionOxygen Delivery Method: Circle system utilized Preoxygenation: Pre-oxygenation with 100% oxygen Intubation Type: IV induction Ventilation: Mask ventilation without difficulty LMA: LMA inserted LMA Size: 4.0 Number of attempts: 1 Airway Equipment and Method: Bite block Placement Confirmation: positive ETCO2 and breath sounds checked- equal and bilateral Tube secured with: Tape Dental Injury: Teeth and Oropharynx as per pre-operative assessment

## 2016-05-05 NOTE — Addendum Note (Signed)
Addendum  created 05/05/16 1321 by Belinda Block, MD   Anesthesia Review and Sign - Signed, Visit Navigator Flowsheet section accepted

## 2016-05-05 NOTE — Transfer of Care (Signed)
Immediate Anesthesia Transfer of Care Note  Patient: Lindsay [name]  Procedure(s) Performed: Procedure(s): BREAST LUMPECTOMY WITH RADIOACTIVE SEED AND SENTINEL LYMPH NODE BIOPSY (Right) PORTA CATH REMOVAL (Right)  Patient Location: PACU  Anesthesia Type:GA combined with regional for post-op pain  Level of Consciousness: awake, alert  and patient cooperative  Airway & Oxygen Therapy: Patient Spontanous Breathing and Patient connected to face mask oxygen  Post-op Assessment: Report given to RN, Post -op Vital signs reviewed and stable and Patient moving all extremities  Post vital signs: Reviewed and stable  Last Vitals:  Vitals:   05/05/16 0840 05/05/16 1128  BP:  139/85  Pulse: 97   Resp: 12   Temp:      Last Pain:  Vitals:   05/05/16 0721  TempSrc: Oral         Complications: No apparent anesthesia complications

## 2016-05-05 NOTE — Interval H&P Note (Signed)
History and Physical Interval Note:  05/05/2016 9:06 AM  Lindsay [name]  has presented today for surgery, with the diagnosis of RIGHT BREAST CANCER  The various methods of treatment have been discussed with the patient and family. After consideration of risks, benefits and other options for treatment, the patient has consented to  Procedure(s): BREAST LUMPECTOMY WITH RADIOACTIVE SEED AND SENTINEL LYMPH NODE BIOPSY AND BLUE DYE INJECTION RIGHT BREAST (Right) as a surgical intervention .  The patient's history has been reviewed, patient examined, no change in status, stable for surgery.  I have reviewed the patient's chart and labs.  Questions were answered to the patient's satisfaction.     Pantelis Elgersma

## 2016-05-05 NOTE — Anesthesia Preprocedure Evaluation (Addendum)
Anesthesia Evaluation  Patient identified by MRN, date of birth, ID band Patient awake  General Assessment Comment:History noted. CG  Reviewed: Allergy & Precautions, NPO status , Patient's Chart, lab work & pertinent test results  Airway Mallampati: II  TM Distance: >3 FB     Dental   Pulmonary neg pulmonary ROS,    breath sounds clear to auscultation       Cardiovascular hypertension, + Peripheral Vascular Disease   Rhythm:Regular Rate:Normal     Neuro/Psych    GI/Hepatic negative GI ROS, Neg liver ROS,   Endo/Other    Renal/GU negative Renal ROS     Musculoskeletal  (+) Arthritis ,   Abdominal   Peds  Hematology   Anesthesia Other Findings   Reproductive/Obstetrics                            Anesthesia Physical Anesthesia Plan  ASA: III  Anesthesia Plan: General   Post-op Pain Management:  Regional for Post-op pain   Induction: Intravenous  Airway Management Planned: LMA  Additional Equipment:   Intra-op Plan:   Post-operative Plan: Extubation in OR  Informed Consent: I have reviewed the patients History and Physical, chart, labs and discussed the procedure including the risks, benefits and alternatives for the proposed anesthesia with the patient or authorized representative who has indicated his/her understanding and acceptance.   Dental advisory given  Plan Discussed with: CRNA and Anesthesiologist  Anesthesia Plan Comments:         Anesthesia Quick Evaluation

## 2016-05-05 NOTE — Progress Notes (Signed)
Nuc med injection performed by nuc med staff. Sedation given and pt tol well. VSS. Will notify Dr Nyoka Cowden ready to proceed with block. Emotional support provided to pt

## 2016-05-05 NOTE — H&P (Signed)
69 yof who presented several months ago with newly diagnosed breast cancer. she has no prior breast history and no family history. she didnt have mass or dc. she underwent screening mm that shows b density breasts. there is posterior uoq mass present that on [place] is 1.9 cm in size. she also had a 1 cm area and possible node with focal cortical thickening. the node and 1 cm biopsy are benign and concordant. the 1.9 cm mass is a grade III IDC that is TNBC with Ki of 70%. MRI prior to chemo showed a 2.5x1.8x2.1 cm mass. she had to stop chemo due to neuropathy. this was last done 11/28. mri at completion on 12/11 shows mild decrease in size of mass to 2 cm in greatest dimension. she has otherwise done fine with chemo.genetic testing has been negative. she is here today to discuss options   Problem List/Past Medical Rolm Bookbinder, MD; 04/21/2016 4:30 PM) BREAST CANCER OF UPPER-OUTER QUADRANT OF RIGHT FEMALE BREAST (C50.411)  SYMPTOMATIC CHOLELITHIASIS (K80.20) 08/04/2015  Past Surgical History Rolm Bookbinder, MD; 04/21/2016 4:30 PM) Breast Biopsy  Right. Colon Polyp Removal - Colonoscopy  Gallbladder Surgery - Laparoscopic  Foot Surgery  Left. Knee Surgery  Left. Hysterectomy (not due to cancer) - Partial   Allergies (Sonya Bynum, CMA; 04/20/2016 3:01 PM) Pollen Extracts *ALTERNATIVE MEDICINES*   Medication History (Sonya Bynum, CMA; 04/20/2016 3:01 PM) TraMADol HCl (50MG  Tablet, Oral) Active. Lisinopril-Hydrochlorothiazide (20-12.5MG  Tablet, Oral) Active. EPINEPHrine (0.3MG /0.3ML Soln Auto-inj, Injection) Active. Allegra (180MG  Tablet, Oral) Active. Medications Reconciled  Social History Rolm Bookbinder, MD; 04/21/2016 4:30 PM) Caffeine use  Carbonated beverages, Tea. No alcohol use  No drug use  Tobacco use  Never smoker.  Family History Rolm Bookbinder, MD; 04/21/2016 4:30 PM) Breast Cancer  Family Members In General. Arthritis  Family  Members In General. Heart Disease  Mother. Hypertension  Family Members In General, Mother.  Vitals (Sonya Bynum CMA; 04/20/2016 3:01 PM) 04/20/2016 3:01 PM Weight: 205 lb Height: 66in Body Surface Area: 2.02 m Body Mass Index: 33.09 kg/m  Temp.: 29F(Temporal)  Pulse: 75 (Regular)  BP: 130/70 (Sitting, Left Arm, Standard)    Physical Exam Rolm Bookbinder MD; 04/21/2016 4:31 PM) General Mental Status-Alert. Orientation-Oriented X3.  Chest and Lung Exam Chest and lung exam reveals -on auscultation, normal breath sounds, no adventitious sounds and normal vocal resonance.  Breast Nipples-No Discharge. Breast Lump-No Palpable Breast Mass.  Cardiovascular Cardiovascular examination reveals -normal heart sounds, regular rate and rhythm with no murmurs.  Lymphatic Head & Neck  General Head & Neck Lymphatics: Bilateral - Description - Normal. Axillary  General Axillary Region: Bilateral - Description - Normal. Note: no Hartwick adenopathy     Assessment & Plan Rolm Bookbinder MD; 04/21/2016 4:32 PM) BREAST CANCER OF UPPER-OUTER QUADRANT OF RIGHT FEMALE BREAST (C50.411) Story: Right breast seed guided lumpectomy, right ax sn biopsy, port removal We discussed a sentinel lymph node biopsy as she does not appear to having lymph node involvement right now. We discussed the performance of that with injection of radioactive tracer and dye. We discussed that there is a chance of having a positive node with a sentinel lymph node biopsy and we will await the permanent pathology to make any other first further decisions in terms of her treatment. One of these options might be to return to the operating room to perform an axillary lymph node dissection. We discussed up to a 5% risk lifetime of chronic shoulder pain as well as lymphedema associated with a sentinel lymph  node biopsy. We discussed the options for treatment of the breast cancer which included  lumpectomy versus a mastectomy. We discussed the performance of the lumpectomy with radioactive seed placement. We discussed a 5-10% chance of a positive margin requiring reexcision in the operating room. We also discussed that she will need radiation therapy if she undergoes lumpectomy. We discussed the mastectomy (removal of whole breast) and the postoperative care for that as well. Mastectomy can be followed by reconstruction. The decision for lumpectomy vs mastectomy has no impact on decision for chemotherapy. Most mastectomy patients will not need radiation therapy. We discussed that there is no difference in her survival whether she undergoes lumpectomy with radiation therapy or antiestrogen therapy versus a mastectomy. There is also no real difference between her recurrence in the breast. We discussed the risks of operation including bleeding, infection, possible reoperation. She understands her further therapy will be based on what her stages at the time of her operation.

## 2016-05-05 NOTE — Op Note (Signed)
Preoperative diagnosis: Right breast cancer, clinical stage II s/p primary chemotherapy Postoperative diagnosis: same as above Procedure: 1. right breast seed guided lumpectomy 2. Right axillary sentinel nodebiopsy 3. Injection blue dye for sentinel node identification 4. Port removal Surgeon Dr Serita Grammes Anes general  EBL: 50 cc Comps none Specimen:  1. Right breast marked with paint 2. Right axillary sentinel nodes with highest count 1373 3. Additional right breast posterior and inferior margins marked short superior, long lateral double deep Sponge count correct at completion Dispoto recovery stable  Indications: This is a 35 yof with right breast cancer who has undergone primary chemotherapy.  She was not able to tolerated complete course due to neuropathy.  Repeat mri shows a 2 cm cancer still present. We discussed options and elected to proceed with seed guided lumpectomy, sn biopsy and port removal which she desires and is ok with oncology. She had seed placed prior to surgery and I had mm available in OR.   Procedure: After informed consent was obtained the patient was taken to the OR. She was injected with technetium in the standard periareolar fashion. she was also injected with mixture of methylene blue dye and saline in periareolar fashion and massaged.  She was given anitibiotics. SCDs were in place. She was prepped and draped in the standard sterile surgical fashion. A timeout was performed. She had undergone a pectoral block. I first infiltrated marcaine in the prior port incision. I reentered the old incision. I removed the port and line in entirety.  Hemostasis observed. I closed this with 3-0 vicryl and 4-0 monocryl. Glue was placed.  I then located the seed in the right latearl breast.  I then infiltrated marcaine. I made curvilinear incision overlying the mass.  I then removed the seedwith an attempt to get a clear margin. This waspassed off the table after  marking with paint. I did confirm removal of theclipand seed with mammography.the clip and seed were directly in center of specimen. I did remove additional margins as above. The posterior margin is muscle and chest wall now.  I placed clips in the cavity.  I obtained hemostasis.  I closed this with 2-0 vicryl, 3-0 vicryl and 4-0 monocryl. Glue was applied.  I then inftitrated marcaine in axilla. I made an incision below hairline and carried this through fascia.  I identified the sentinel nodes with the highest count as above. One node appeared to be blue also.here was no gross nodal disease.  There was no background radioactivity or any further blue dye.. I obtained hemostasis. I then closed the fascia with 2-0 vicryl.  I closed the skin with 3-0 vicryl and 4-0 monocryl. Glue and steristrips were placed A breast binder was placed. She was extubated and transferred to recovery stable

## 2016-05-06 ENCOUNTER — Encounter (HOSPITAL_BASED_OUTPATIENT_CLINIC_OR_DEPARTMENT_OTHER): Payer: Self-pay | Admitting: General Surgery

## 2016-05-20 NOTE — Progress Notes (Signed)
Location of Breast Cancer: upper outer quadrant of right breast    Histology per Pathology Report:   05/05/16 Diagnosis 1. Breast, lumpectomy, Right - INVASIVE DUCTAL CARCINOMA, GRADE III/III, SPANNING 2.7 CM. - DUCTAL CARCINOMA IN SITU, HIGH GRADE. - THE SURGICAL RESECTION MARGINS ARE NEGATIVE FOR CARCINOMA. - SEE ONCOLOGY TABLE BELOW. 2. Lymph node, sentinel, biopsy, Right axillary #1 - THERE IS NO EVIDENCE OF CARCINOMA IN 1 OF 1 LYMPH NODE (0/1). 3. Lymph node, sentinel, biopsy, Right axillary #2 - THERE IS NO EVIDENCE OF CARCINOMA IN 1 OF 1 LYMPH NODE (0/1). 4. Breast, excision, Right additional posterior margin - BENIGN BREAST PARENCHYMA WITH FIBROCYSTIC CHANGES. - ONE BENIGN LYMPH NODE (0/1). - THERE IS NO EVIDENCE OF MALIGNANCY. - SEE COMMENT. 5. Breast, excision, Right additional inferior margin - FIBROCYSTIC CHANGES WITH ADENOSIS AND CALCIFICATIONS. - THERE IS NO EVIDENCE OF MALIGNANCY. - SEE COMMENT.  12/14/15 Diagnosis 1. Breast, right, needle core biopsy, 9 o'clock mass, 14 cm fn INVASIVE DUCTAL CARCINOMA, GRADE 2 TO 3 2. Lymph node, needle/core biopsy, right axilla ONE LYMPH NODE, NEGATIVE FOR MALIGNANCY 3. Breast, right, needle core biopsy, right mass, 10 cm fn BENIGN FIBROADIPOSE TISSUE  Receptor Status: ER(0%), PR (0%), Her2-neu (negative), Ki-(70%)  Did patient present with symptoms (if so, please note symptoms) or was this found on screening mammography?: screening mammogram  Past/Anticipated interventions by surgeon:  05/05/16 - Procedure: BREAST LUMPECTOMY WITH RADIOACTIVE SEED AND SENTINEL LYMPH NODE BIOPSY;  Surgeon: Rolm Bookbinder, MD    Past/Anticipated interventions by medical oncology, if any: neoadjuvant chemotherapy consisting of doxorubicin and cyclophosphamide in dose this fashion 4, completed 02/22/2016, followed by paclitaxel weekly 5 (of 12 treatments planned), starting 03/08/2016, stopped 04/05/2016 after cycle 5 because of neuropathy.  cycle 4 of cyclophosphamide and doxorubicin held 1 week because of side effects.  Lymphedema issues, if any:      Pain issues, if any:     SAFETY ISSUES:  Prior radiation?   Pacemaker/ICD?   Possible current pregnancy?  Is the patient on methotrexate?   Current Complaints / other details:      Jacqulyn Liner, RN 05/20/2016,9:23 AM

## 2016-05-25 ENCOUNTER — Ambulatory Visit
Admission: RE | Admit: 2016-05-25 | Discharge: 2016-05-25 | Disposition: A | Discharge status: Home / Self Care | Payer: BLUE CROSS/BLUE SHIELD | Source: Ambulatory Visit | Attending: Radiation Oncology | Admitting: Radiation Oncology

## 2016-05-25 ENCOUNTER — Ambulatory Visit: Payer: BLUE CROSS/BLUE SHIELD

## 2016-05-25 DIAGNOSIS — Z510 Encounter for antineoplastic radiation therapy: Secondary | ICD-10-CM | POA: Insufficient documentation

## 2016-05-25 DIAGNOSIS — Z79899 Other long term (current) drug therapy: Secondary | ICD-10-CM | POA: Insufficient documentation

## 2016-05-25 DIAGNOSIS — Z171 Estrogen receptor negative status [ER-]: Secondary | ICD-10-CM | POA: Insufficient documentation

## 2016-05-25 DIAGNOSIS — C50411 Malignant neoplasm of upper-outer quadrant of right female breast: Secondary | ICD-10-CM | POA: Insufficient documentation

## 2016-05-26 ENCOUNTER — Ambulatory Visit: Payer: Managed Care, Other (non HMO) | Admitting: Physical Therapy

## 2016-05-27 ENCOUNTER — Ambulatory Visit: Payer: BLUE CROSS/BLUE SHIELD | Attending: General Surgery | Admitting: Physical Therapy

## 2016-05-27 ENCOUNTER — Encounter: Payer: Self-pay | Admitting: Physical Therapy

## 2016-05-27 DIAGNOSIS — M25512 Pain in left shoulder: Secondary | ICD-10-CM | POA: Diagnosis present

## 2016-05-27 DIAGNOSIS — M25611 Stiffness of right shoulder, not elsewhere classified: Secondary | ICD-10-CM | POA: Diagnosis present

## 2016-05-27 DIAGNOSIS — M25612 Stiffness of left shoulder, not elsewhere classified: Secondary | ICD-10-CM | POA: Insufficient documentation

## 2016-05-27 DIAGNOSIS — M25511 Pain in right shoulder: Secondary | ICD-10-CM | POA: Insufficient documentation

## 2016-05-27 DIAGNOSIS — M6281 Muscle weakness (generalized): Secondary | ICD-10-CM | POA: Diagnosis present

## 2016-05-27 NOTE — Therapy (Signed)
[place], [place], [address] Phone: [phone]   Fax:  [phone]  Physical Therapy Evaluation  Patient Details  Name: Lindsay [name] MRN: [id number] Date of Birth: January 09, 1966 Referring Provider: Donne Hazel  Encounter Date: 05/27/2016      PT End of Session - 05/27/16 1159    Visit Number 1   Number of Visits 9   Date for PT Re-Evaluation 06/24/16   PT Start Time 1019   PT Stop Time 1059   PT Time Calculation (min) 40 min   Activity Tolerance Patient tolerated treatment well   Behavior During Therapy [facility] for tasks assessed/performed      Past Medical History:  Diagnosis Date  . Allergy    seasonal  . Arthritis   . Breast cancer of upper-outer quadrant of right female breast ([place]) 12/16/2015  . Depression    mild depression after death of daughter- no meds  . Hot flashes   . Hx of adenomatous colonic polyps 04/25/2014  . Hypertension     Past Surgical History:  Procedure Laterality Date  . ABDOMINAL HYSTERECTOMY    . BREAST LUMPECTOMY WITH RADIOACTIVE SEED AND SENTINEL LYMPH NODE BIOPSY Right 05/05/2016   Procedure: BREAST LUMPECTOMY WITH RADIOACTIVE SEED AND SENTINEL LYMPH NODE BIOPSY;  Surgeon: Rolm Bookbinder, MD;  Location: [place];  Service: General;  Laterality: Right;  . CHOLECYSTECTOMY N/A 09/09/2015   Procedure: LAPAROSCOPIC CHOLECYSTECTOMY;  Surgeon: Ralene Ok, MD;  Location[facility];  Service: General;  Laterality: N/A;  . COLONOSCOPY    . KNEE ARTHROSCOPY  2016  . PERIPHERAL VASCULAR CATHETERIZATION Right 05/05/2016   Procedure: PORTA CATH REMOVAL;  Surgeon: Rolm Bookbinder, MD;  Location: [place];  Service: General;  Laterality: Right;  . PORTACATH PLACEMENT Right 12/29/2015   Procedure: INSERTION PORT-A-CATH WITH ULTRASOUND GUIDANCE;  Surgeon: Rolm Bookbinder, MD;  Location: [place];  Service: General;  Laterality: Right;    There  were no vitals filed for this visit.       Subjective Assessment - 05/27/16 1024    Subjective I feel my range of motion has improved since last week. I am still having trouble washing my back. I have not been lifting my grandchildren or anything like that. I feel some tightness in my armpit when I lift my arm up. I have trouble reaching up over the stove where the canned goods are. Pt reports that she does have numbness in her L arm.    Pertinent History R lumpectomy and sentinel lymph node biopsy on 05/05/16, she underwent chemotherapy prior to the lumpectomy, pt will undergo radiation in approx 3-4 more weeks, torn meniscus L knee with surgical repair,    Patient Stated Goals to get my range of motion back in my arm   Currently in Pain? No/denies            [facility] PT Assessment - 05/27/16 0001      Assessment   Medical Diagnosis right breast cancer   Referring Provider Donne Hazel   Onset Date/Surgical Date 05/05/16   Hand Dominance Left   Prior Therapy none     Precautions   Precautions Other (comment)  at risk for lymphedema   Precaution Comments at risk for lymphedema     Restrictions   Weight Bearing Restrictions No     Balance Screen   Has the patient fallen in the past 6 months No   Has the patient had a decrease in activity  level because of a fear of falling?  No   Is the patient reluctant to leave their home because of a fear of falling?  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, has to carry plates, wash dishes, mop and sweep   Leisure pt does not exercise     Cognition   Overall Cognitive Status Within Functional Limits for tasks assessed     Observation/Other  Assessments   Skin Integrity pt's lumpectomy and lymph node biopsy scars are healing well with minimal fibrosis - more fibrosis noted around lymph node scar with tenderness reported by pt     AROM   Right Shoulder Flexion 126 Degrees   Right Shoulder ABduction 93 Degrees   Right Shoulder Internal Rotation 52 Degrees   Right Shoulder External Rotation 75 Degrees   Left Shoulder Flexion 165 Degrees   Left Shoulder ABduction 150 Degrees   Left Shoulder Internal Rotation 22 Degrees   Left Shoulder External Rotation 89 Degrees     Strength   Overall Strength Unable to assess  on L or R due to pain           LYMPHEDEMA/ONCOLOGY QUESTIONNAIRE - 05/27/16 1040      Type   Cancer Type right breast cancer     Surgeries   Lumpectomy Date 05/05/16   Sentinel Lymph Node Biopsy Date 05/05/16   Number Lymph Nodes Removed 2     Treatment   Active Chemotherapy Treatment No   Past Chemotherapy Treatment Yes   Date 03/23/16   Active Radiation Treatment No  pt to begin soon   Past Radiation Treatment No   Current Hormone Treatment No   Past Hormone Therapy No     What other symptoms do you have   Are you Having Heaviness or Tightness Yes   Are you having Pain Yes   Are you having pitting edema No   Is it Hard or Difficult finding clothes that fit No   Do you have infections No     Lymphedema Assessments   Lymphedema Assessments Upper extremities     Right Upper Extremity Lymphedema   15 cm Proximal to Olecranon Process 38.5 cm   Olecranon Process 28.5 cm   15 cm Proximal to Ulnar Styloid Process 28 cm   Just Proximal to Ulnar Styloid Process 17.5 cm   Across Hand at [employer] 21.2 cm   At [place] of 2nd Digit 7.2 cm     Left Upper Extremity Lymphedema   15 cm Proximal to Olecranon Process 38 cm   Olecranon Process 29.5 cm   15 cm Proximal to Ulnar Styloid Process 27.9 cm   Just Proximal to Ulnar Styloid Process 17.6 cm   Across Hand at [employer] 21 cm   At [place]  of 2nd Digit 7 cm           [place] - 05/27/16 0001    Open a tight or new jar Mild difficulty   Do heavy household chores (wash walls, wash floors) Mild difficulty   Carry a shopping bag or briefcase Mild difficulty  Wash your back Severe difficulty   Use a knife to cut food No difficulty   Recreational activities in which you take some force or impact through your arm, shoulder, or hand (golf, hammering, tennis) No difficulty   During the past week, to what extent has your arm, shoulder or hand problem interfered with your normal social activities with family, friends, neighbors, or groups? Quite a bit   During the past week, to what extent has your arm, shoulder or hand problem limited your work or other regular daily activities Moderer[place]   Arm, shoulder, or hand pain. Moderate   Tingling (pins and needles) in your arm, shoulder, or hand Moderate   Difficulty Sleeping So much difficuSo much difficulty, I can't sleep   DASH Score 43.18 %                     PT Education - 05/27/16 1202    Education provided Yes   Education Details anatomy and physiology of lymphatic system, lymphedema risk reduction practices   Person(s) Educated Patient   Methods Explanation;Handout   Comprehension Verbalized understanding                [place] - 05/27/16 1204      CC Long Term Goal  #1   Title Pt to demonstrate 160 degrees of right shoulder flexion to allow her to reach items over her stove without difficulty.   Baseline 126   Time 4   Period Weeks   Status New     CC Long Term Goal  #2   Title Pt to demonstrate 150 degrees of right shoulder abduction to allow her to reach items out to her sides   Baseline 93   Time 4   Period Weeks   Status New     CC Long Term Goal  #3   Title Pt to demonstrate 50 degrees of left shoulder IR to allow her to return to prior level of function   Baseline 22   Time 4   Period Weeks   Status New      CC Long Term Goal  #4   Title Patient to independently verbalize lymphedema risk reduction practices   Time 4   Period Weeks   Status New     CC Long Term Goal  #5   Title Patient to be independent in a home exercise program for continued strengthening and stretching   Time 4   Period Weeks   Status New            Plan - 05/27/16 1104    Clinical Impression Statement Pt presents to PT with decreased right shoulder ROM following right lumpectomy and sentintel lymph node biopsy on 05/05/16. Evaluation reveals decreased left shoulder ROM as well especially left shoulder internal rotation. She reports she has occasional numbness in this arm since undergoing chemotherapy. Strength unable to be assessed bilaterally due to pain. Pt's job requires lifting of trays, washing dishes, mopping and sweeping. She plans on returning to work eventually. She has already completed chemotherapy and will begin radiation in the next few weeks. Pt would benefit from skilled PT services to increase bilateral shoulder ROM and strength and to decrease pain and tightness in right axilla. This evaluation was of low complexity due to lack of comorbidities.    Rehab Potential Good   Clinical Impairments Affecting Rehab Potential pt to begin radiation soon   PT Frequency 2x / week  PT Duration 4 weeks   PT Treatment/Interventions ADLs/Self Care Home Management;Iontophoresis 4mg /ml Dexamethasone;Therapeutic activities;Therapeutic exercise;Patient/family education;Manual techniques;Scar mobilization;Passive range of motion   PT Next Visit Plan begin AAROM/PROM/AROM to L and R shoulders- pulleys, ball up wall, etc   Consulted and Agree with Plan of Care Patient      Patient will benefit from skilled therapeutic intervention in order to improve the following deficits and impairments:  Pain, Decreased scar mobility, Impaired UE functional use, Decreased strength, Decreased range of motion, Decreased knowledge of  precautions  Visit Diagnosis: Stiffness of right shoulder, not elsewhere classified - Plan: PT plan of care cert/re-cert  Acute pain of right shoulder - Plan: PT plan of care cert/re-cert  Stiffness of left shoulder, not elsewhere classified - Plan: PT plan of care cert/re-cert  Acute pain of left shoulder - Plan: PT plan of care cert/re-cert  Muscle weakness (generalized) - Plan: PT plan of care cert/re-cert     Problem List Patient Active Problem List   Diagnosis Date Noted  . Genetic testing 03/20/2016  . Family history of breast cancer in female 02/10/2016  . Breast cancer of upper-outer quadrant of right female breast ([place]) 12/16/2015  . Hx of adenomatous colonic polyps 04/25/2014  . Neck pain 11/06/2012    Allyson Sabal [facility] 05/27/2016, 12:07 PM  [place], [place], [address] Phone: [phone]   Fax:  [phone]  Name: DORLISA DELPHIA MRN: [id number] Date of Birth: 03-16-1966  Manus Gunning, PT 05/27/16 12:08 PM

## 2016-05-30 ENCOUNTER — Ambulatory Visit: Payer: BLUE CROSS/BLUE SHIELD | Admitting: Physical Therapy

## 2016-05-30 ENCOUNTER — Encounter: Payer: Self-pay | Admitting: Physical Therapy

## 2016-05-30 DIAGNOSIS — M6281 Muscle weakness (generalized): Secondary | ICD-10-CM

## 2016-05-30 DIAGNOSIS — M25511 Pain in right shoulder: Secondary | ICD-10-CM

## 2016-05-30 DIAGNOSIS — M25611 Stiffness of right shoulder, not elsewhere classified: Secondary | ICD-10-CM | POA: Diagnosis not present

## 2016-05-30 DIAGNOSIS — M25612 Stiffness of left shoulder, not elsewhere classified: Secondary | ICD-10-CM

## 2016-05-30 DIAGNOSIS — M25512 Pain in left shoulder: Secondary | ICD-10-CM

## 2016-05-30 NOTE — Patient Instructions (Signed)
Shoulder: Flexion (Supine)    With hands shoulder width apart, slowly lower dowel to floor behind head. Do not let elbows bend. Keep back flat. Hold _5-10___ seconds. Repeat 10____ times. Do __2__ sessions per day. CAUTION: Stretch slowly and gently.  Copyright  VHI. All rights reserved.  Shoulder: Abduction (Supine)    With right arm flat on floor, hold dowel in palm. Slowly move arm up to side of head by pushing with opposite arm. Do not let elbow bend. Hold 5-10____ seconds. Repeat _10___ times. Do _2___ sessions per day. Repeat on left arm. CAUTION: Stretch slowly and gently.  Copyright  VHI. All rights reserved.

## 2016-05-30 NOTE — Therapy (Signed)
[name], [place], [address] Phone: [phone]   Fax:  [phone]  Physical Therapy Treatment  Patient Details  Name: [place] [name] MRN: [id number] Date of Birth: 07/24/1965 Referring Provider: Donne [name]  Encounter Date: 05/30/2016      PT End of Session - 05/30/16 1520    Visit Number 2   Number of Visits 9   Date for PT Re-Evaluation 06/24/16   PT Start Time 1435   PT Stop Time 1516   PT Time Calculation (min) 41 min   Activity Tolerance Patient tolerated treatment well   Behavior During Therapy [facility] for tasks assessed/performed      Past Medical History:  Diagnosis Date  . Allergy    seasonal  . Arthritis   . Breast cancer of upper-outer quadrant of right female breast ([place]) 12/16/2015  . Depression    mild depression after death of daughter- no meds  . Hot flashes   . Hx of adenomatous colonic polyps 04/25/2014  . Hypertension     Past Surgical History:  Procedure Laterality Date  . ABDOMINAL HYSTERECTOMY    . BREAST LUMPECTOMY WITH RADIOACTIVE SEED AND SENTINEL LYMPH NODE BIOPSY Right 05/05/2016   Procedure: BREAST LUMPECTOMY WITH RADIOACTIVE SEED AND SENTINEL LYMPH NODE BIOPSY;  Surgeon: [place] Bookbinder, MD;  Location: [place];  Service: General;  Laterality: Right;  . CHOLECYSTECTOMY N/A 09/09/2015   Procedure: LAPAROSCOPIC CHOLECYSTECTOMY;  Surgeon: [place] Ok, MD;  [facility];  Service: General;  Laterality: N/A;  . COLONOSCOPY    . KNEE ARTHROSCOPY  2016  . PERIPHERAL VASCULAR CATHETERIZATION Right 05/05/2016   Procedure: PORTA CATH REMOVAL;  Surgeon: [place] Bookbinder, MD;  Location: [place];  Service: General;  Laterality: Right;  . PORTACATH PLACEMENT Right 12/29/2015   Procedure: INSERTION PORT-A-CATH WITH ULTRASOUND GUIDANCE;  Surgeon: [place] Bookbinder, MD;  Location: [place];  Service: General;  Laterality: Right;    There  were no vitals filed for this visit.      Subjective Assessment - 05/30/16 1438    Subjective I feel some tightness in my right armpit.    Pertinent History R lumpectomy and sentinel lymph node biopsy on 05/05/16, she underwent chemotherapy prior to the lumpectomy, pt will undergo radiation in approx 3-4 more weeks, torn meniscus L knee with surgical repair,    Patient Stated Goals to get my range of motion back in my arm   Currently in Pain? No/denies                         [facility] Adult PT Treatment/Exercise - 05/30/16 0001      Shoulder Exercises: Supine   Flexion AAROM;Both;10 reps;Other (comment)  dowel   ABduction AAROM;Both;10 reps;Other (comment)  dowel     Shoulder Exercises: Pulleys   Flexion 2 minutes   ABduction 2 minutes     Shoulder Exercises: Therapy Ball   Flexion 10 reps   Flexion Limitations --  with stretch at end range   ABduction 10 reps  with stretch at end range     Manual Therapy   Manual Therapy Myofascial release;Passive ROM   Myofascial Release pulling into abduction on RUE   Passive ROM to BUEs in direction of flexion, abduction and ER with focus on [name] -  05/27/16 1204      CC Long Term Goal  #1   Title Pt to demonstrate 160 degrees of right shoulder flexion to allow her to reach items over her stove without difficulty.   Baseline 126   Time 4   Period Weeks   Status New     CC Long Term Goal  #2   Title Pt to demonstrate 150 degrees of right shoulder abduction to allow her to reach items out to her sides   Baseline 93   Time 4   Period Weeks   Status New     CC Long Term Goal  #3   Title Pt to demonstrate 50 degrees of left shoulder IR to allow her to return to prior level of function   Baseline 22   Time 4   Period Weeks   Status New     CC Long Term Goal  #4   Title Patient to independently verbalize lymphedema risk reduction practices   Time 4    Period Weeks   Status New     CC Long Term Goal  #5   Title Patient to be independent in a home exercise program for continued strengthening and stretching   Time 4   Period Weeks   Status New            Plan - 05/30/16 1521    Clinical Impression Statement Began instructing patient in AAROM exercises for bilateral upper extremities including pulleys, ball up wall, supine dowel exercises. Issued supine dowel exercises to patient to complete at home. Myofascial and PROM performed to bilateral UEs with focus on RUE which is more tight than left. Pt feels increased tightness in right axilla.    Rehab Potential Good   Clinical Impairments Affecting Rehab Potential pt to begin radiation soon   PT Frequency 2x / week   PT Duration 4 weeks   PT Treatment/Interventions ADLs/Self Care Home Management;Iontophoresis 4mg /ml Dexamethasone;Therapeutic activities;Therapeutic exercise;Patient/family education;Manual techniques;Scar mobilization;Passive range of motion   PT Next Visit Plan continue AAROM/PROM/AROM to L and R shoulders- pulleys, ball up wall, etc   Consulted and Agree with Plan of Care Patient      Patient will benefit from skilled therapeutic intervention in order to improve the following deficits and impairments:  Pain, Decreased scar mobility, Impaired UE functional use, Decreased strength, Decreased range of motion, Decreased knowledge of precautions  Visit Diagnosis: Stiffness of right shoulder, not elsewhere classified  Acute pain of right shoulder  Acute pain of left shoulder  Stiffness of left shoulder, not elsewhere classified  Muscle weakness (generalized)     Problem List Patient Active Problem List   Diagnosis Date Noted  . Genetic testing 03/20/2016  . Family history of breast cancer in female 02/10/2016  . Breast cancer of upper-outer quadrant of right female breast ([place]) 12/16/2015  . Hx of adenomatous colonic polyps 04/25/2014  . Neck pain 11/06/2012     Lindsay [name] [facility] 05/30/2016, 3:23 PM  [name], [place], [address] Phone: [phone]   Fax:  [phone]  Name: [place] [name] MRN: [id number] Date of Birth: [birth year]  Manus Gunning, PT 05/30/16 3:23 PM

## 2016-05-31 ENCOUNTER — Ambulatory Visit (HOSPITAL_BASED_OUTPATIENT_CLINIC_OR_DEPARTMENT_OTHER): Payer: BLUE CROSS/BLUE SHIELD | Admitting: Oncology

## 2016-05-31 VITALS — BP 114/60 | HR 82 | Temp 98.3°F | Resp 17 | Ht 66.0 in | Wt 206.6 lb

## 2016-05-31 DIAGNOSIS — C50411 Malignant neoplasm of upper-outer quadrant of right female breast: Secondary | ICD-10-CM | POA: Diagnosis not present

## 2016-05-31 DIAGNOSIS — Z171 Estrogen receptor negative status [ER-]: Secondary | ICD-10-CM

## 2016-05-31 NOTE — Progress Notes (Signed)
[place]  [phone]) [phone] [phone]     ID: Lindsay [name] DOB: [birth year]  MR#: [id number]  XBL#:[id number]  Patient Care Team: No Pcp Per Patient as PCP - General (General Practice) Rolm Bookbinder, MD as Consulting Physician (General Surgery) Chauncey Cruel, MD as Consulting Physician (Oncology) Gery Pray, MD as Consulting Physician (Radiation Oncology) Newton Pigg, MD as Consulting Physician (Obstetrics and Gynecology) Dorna Leitz, MD as Consulting Physician (Orthopedic Surgery) OTHER MD:  CHIEF COMPLAINT: Invasive breast cancer  CURRENT TREATMENT:    BREAST CANCER HISTORY: From the original intake note:  Lindsay [name] had routine screening mammography with tomography at [facility] 12/04/2015. There was a new mass measuring 1.5 cm at the 11:00 position of the right breast. Right breast ultrasonography 12/10/2015 confirmed a 1.9 cm irregular mass in the upper-outer quadrant of the right breast posteriorly. There was a right axillary lymph node with focal cortical thickening.  On 12/14/2015 Lindsay [name] underwent biopsy of the right breast mass at 9:00 position as well as a suspicious lymph node. A separate area in the right breast 10 cm from the nipple was also biopsied biopsied. The final pathology ([id number]) showed the additional area and the lymph node to be benign. The 9:00 mass however was positive for invasive ductal carcinoma, grade 3, estrogen receptor and progesterone receptor negative, with an MIB-1 of 70%, and HER-2 nonamplified with a signals ratio 1.44, and the number per cell 2.70.  Her subsequent history is as detailed below  INTERVAL HISTORY: Lindsay [name] returns today for follow-up of her triple negative breast cancer. Since her last visit here, she underwent right lumpectomy and sentinel lymph node sampling, on 05/05/2016. The final pathology ([id number]) showed residual invasive ductal carcinoma measuring 2.7 cm. This was grade 3. Margins were  negative. Both sentinel lymph nodes and a third incidentally removed lymph node were clear.  The prognostic panel was repeated and the tumor was again estrogen and progesterone receptor negative, HER-2 nonamplified, with a signals ratio of 1.13 and the number per cell 2.15  She then proceeded to radiation, which is ongoing.   REVIEW OF SYSTEMS: Lindsay [name] did well with her surgery, to pain medicine only for about 2 days, had no bleeding or fever. She had everybody come to her house for the holidays but didn't do any cooking. She is getting some physical therapy and trying to stretch her arms a little bit more so that she is not limited in the future. She is not wearing any deodorant right now because she is concerned that the deodorant she had been wearing might have caused her breast cancer. Aside from these issues a detailed review of systems today was stable  PAST MEDICAL HISTORY: Past Medical History:  Diagnosis Date  . Allergy    seasonal  . Arthritis   . Breast cancer of upper-outer quadrant of right female breast ([place]) 12/16/2015  . Depression    mild depression after death of daughter- no meds  . Hot flashes   . Hx of adenomatous colonic polyps 04/25/2014  . Hypertension     PAST SURGICAL HISTORY: Past Surgical History:  Procedure Laterality Date  . ABDOMINAL HYSTERECTOMY    . BREAST LUMPECTOMY WITH RADIOACTIVE SEED AND SENTINEL LYMPH NODE BIOPSY Right 05/05/2016   Procedure: BREAST LUMPECTOMY WITH RADIOACTIVE SEED AND SENTINEL LYMPH NODE BIOPSY;  Surgeon: Rolm Bookbinder, MD;  Location: [facility];  Service: General;  Laterality: Right;  . CHOLECYSTECTOMY N/A 09/09/2015   Procedure: LAPAROSCOPIC CHOLECYSTECTOMY;  Surgeon:  Ralene Ok, MD;  [facility];  Service: General;  Laterality: N/A;  . COLONOSCOPY    . KNEE ARTHROSCOPY  2016  . PERIPHERAL VASCULAR CATHETERIZATION Right 05/05/2016   Procedure: PORTA CATH REMOVAL;  Surgeon: Rolm Bookbinder, MD;   Location: [facility];  Service: General;  Laterality: Right;  . PORTACATH PLACEMENT Right 12/29/2015   Procedure: INSERTION PORT-A-CATH WITH ULTRASOUND GUIDANCE;  Surgeon: Rolm Bookbinder, MD;  Location: [facility];  Service: General;  Laterality: Right;    FAMILY HISTORY Family History  Problem Relation Age of Onset  . Hypertension Mother   . Breast cancer Maternal Grandfather   . Breast cancer Other   . Breast cancer Cousin   . Sudden death Neg Hx   . Diabetes Neg Hx   . Heart attack Neg Hx   . Hyperlipidemia Neg Hx   . Colon cancer Neg Hx   . Esophageal cancer Neg Hx   . Rectal cancer Neg Hx   . Stomach cancer Neg Hx   The patient's father was murdered at age 30. The patient's mother is 15 years old as of August 2017. The patient has one brother, 2 sisters. A cousin was diagnosed with left breast cancer at age 88. A maternal aunt and a maternal grandmother were also diagnosed with breast cancer at age 91 and 21 respectively.  GYNECOLOGIC HISTORY:  No LMP recorded. Patient has had a hysterectomy.  Menarche age 76, first live birth age 11, the patient is GX P3. She is status post hysterectomy without salpingo-oophorectomy. She did not take hormone replacement. She used oral contraceptives remotely without complications.  SOCIAL HISTORY:  Lindsay [name] works as an Engineer, production at [employer]. Her husband [name] [place] is a news and record Lexicographer. Son [name] lives in [place] and is a Games developer. Son [name] lives in [place] and is a Architectural technologist. The patient had a daughter who died at the age of 71.    ADVANCED DIRECTIVES: Not in place   HEALTH MAINTENANCE: Social History  Substance Use Topics  . Smoking status: Never Smoker  . Smokeless tobacco: Never Used  . Alcohol use No     Colonoscopy: 2016  PAP:  Bone density: Never   Allergies  Allergen Reactions  . Percocet [Oxycodone-Acetaminophen] Diarrhea and Nausea And Vomiting  . Tramadol Nausea  And Vomiting  . Vicodin [Hydrocodone-Acetaminophen] Nausea And Vomiting  . Gabapentin Other (See Comments)    "light headed, dizziness, didn't like the way it made me feel"  . Pollen Extract Other (See Comments)    Runny nose, itchy eyes and sneezing due to seasonal allergies.    Current Outpatient Prescriptions  Medication Sig Dispense Refill  . EPINEPHrine (EPIPEN 2-PAK) 0.3 mg/0.3 mL IJ SOAJ injection Inject 0.3 mg into the muscle once as needed (anaphylaxis allergic reaction).     . fexofenadine (ALLEGRA) 180 MG tablet Take 180 mg by mouth daily.    Marland Kitchen HYDROmorphone (DILAUDID) 4 MG tablet Take 0.5 tablets (2 mg total) by mouth every 6 (six) hours as needed for severe pain. (Patient not taking: Reported on 05/27/2016) 10 tablet 0  . lisinopril-hydrochlorothiazide (PRINZIDE,ZESTORETIC) 20-12.5 MG per tablet Take 1 tablet by mouth daily.    . meloxicam (MOBIC) 15 MG tablet Take 1 tablet (15 mg total) by mouth daily as needed for pain. 30 tablet 0  . promethazine (PHENERGAN) 12.5 MG tablet Take 1 tablet (12.5 mg total) by mouth every 6 (six) hours as needed for nausea or vomiting. (Patient  not taking: Reported on 05/27/2016) 30 tablet 0  . Triamcinolone Acetonide (NASACORT ALLERGY 24HR NA) Place 1-2 sprays into the nose daily as needed (allergies).     No current facility-administered medications for this visit.     OBJECTIVE: Middle-aged African-American woman Who appears stated age  89:   05/31/16 1259  BP: 114/60  Pulse: 82  Resp: 17  Temp: 98.3 F (36.8 C)     Body mass index is 33.35 kg/m.    ECOG FS:1 - Symptomatic but completely ambulatory  Sclerae unicteric, pupils round and equal Oropharynx clear and moist-- no thrush or other lesions No cervical or supraclavicular adenopathy Lungs no rales or rhonchi Heart regular rate and rhythm Abd soft, nontender, positive bowel sounds MSK no focal spinal tenderness, no upper extremity lymphedema Neuro: nonfocal, well oriented,  appropriate affect Breasts: The right breast is status post lumpectomy. There is a very minimal change in the lateral aspect of the breast contour, but in general the cosmetic result is excellent. There is no residual palpable mass, no erythema, and no swelling. The right axilla is benign. The left breast is unremarkable.   LAB RESULTS:  CMP     Component Value Date/Time   NA 141 04/15/2016 1158   K 3.3 (L) 04/15/2016 1158   CL 100 (L) 04/09/2016 1310   CO2 29 04/15/2016 1158   GLUCOSE 90 04/15/2016 1158   BUN 15.9 04/15/2016 1158   CREATININE 0.8 04/15/2016 1158   CALCIUM 10.5 (H) 04/15/2016 1158   PROT 7.9 04/15/2016 1158   ALBUMIN 4.1 04/15/2016 1158   AST 36 (H) 04/15/2016 1158   ALT 40 04/15/2016 1158   ALKPHOS 48 04/15/2016 1158   BILITOT 0.90 04/15/2016 1158   GFRNONAA >60 04/09/2016 1310   GFRAA >60 04/09/2016 1310    INo results found for: SPEP, UPEP  Lab Results  Component Value Date   WBC 2.7 (L) 04/15/2016   NEUTROABS 1.6 04/15/2016   HGB 11.1 (L) 04/15/2016   HCT 34.2 (L) 04/15/2016   MCV 91.0 04/15/2016   PLT 371 04/15/2016      Chemistry      Component Value Date/Time   NA 141 04/15/2016 1158   K 3.3 (L) 04/15/2016 1158   CL 100 (L) 04/09/2016 1310   CO2 29 04/15/2016 1158   BUN 15.9 04/15/2016 1158   CREATININE 0.8 04/15/2016 1158      Component Value Date/Time   CALCIUM 10.5 (H) 04/15/2016 1158   ALKPHOS 48 04/15/2016 1158   AST 36 (H) 04/15/2016 1158   ALT 40 04/15/2016 1158   BILITOT 0.90 04/15/2016 1158       No results found for: LABCA2  No components found for: [id number]  No results for input(s): INR in the last 168 hours.  Urinalysis    Component Value Date/Time   COLORURINE YELLOW 04/09/2016 1455   APPEARANCEUR CLOUDY (A) 04/09/2016 1455   LABSPEC 1.011 04/09/2016 1455   PHURINE 7.0 04/09/2016 1455   GLUCOSEU NEGATIVE 04/09/2016 1455   HGBUR NEGATIVE 04/09/2016 1455   BILIRUBINUR NEGATIVE 04/09/2016 1455   KETONESUR  NEGATIVE 04/09/2016 1455   PROTEINUR NEGATIVE 04/09/2016 1455   UROBILINOGEN 1.0 03/03/2015 1240   NITRITE NEGATIVE 04/09/2016 1455   LEUKOCYTESUR NEGATIVE 04/09/2016 1455     STUDIES: Nm Sentinel Node Inj-no Rpt (breast)  Result Date: 05/18/2016 Sulfur colloid was injected intradermally by the nuclear medicine technologist for breast cancer sentinel node localization   ELIGIBLE FOR AVAILABLE RESEARCH PROTOCOL: considered the PREVENT trial:  decided against it  ASSESSMENT: 51 y.o. [place] woman status post right breast upper outer quadrant biopsy 12/14/2015 for a clinical T1c N0, stage IA  invasive ductal carcinoma, grade 3, triple negative, with an MIB-1 of 70%  (1) genetics testing negative for mutations within any of 20 genes on the Breast/Ovarian Cancer Panel through [employer] [facility] [employer] [facility].  Additionally, no VUS were found.   (2) neoadjuvant chemotherapy consisting of doxorubicin and cyclophosphamide in dose this fashion 4, completed 02/22/2016, followed by paclitaxel weekly 5 (of 12 treatments planned), starting 03/08/2016, stopped 04/05/2016 after cycle 5 because of neuropathy  (a) cycle 4 of cyclophosphamide and doxorubicin held 1 week because of side effects  (3) breast conserving surgery to follow chemotherapy  (4) adjuvant radiation in process  (5) left thyroid nodule biopsy 04/14/2016 read as [facility] 1   PLAN: Lamae tolerated her surgery well. She had approximately 1 inch of residual tumor, but margins were negative and all 3 lymph nodes were clear. Though she remains at some risk of recurrence, I don't think her residual tumor burden is high enough to consider capecitabine with radiation  She will be seeing Dr. Sondra Come next week. She should be starting radiation shortly after that. I reassured her that after she finishes radiation she will be able to use any deodorant she likes, but during radiation it has to be a nonmetallic deodorant and they will advise her regarding  that.  I'm going to see her in approximately 2 months. By that time she may be getting ready to get back to work. She knows to call for any problems that may develop before that      :Chauncey Cruel, MD   05/31/2016 1:20 PM Medical Oncology and Hematology [facility] [place], [place] [address] Tel. [phone]    Fax. [phone] you

## 2016-06-02 ENCOUNTER — Ambulatory Visit: Payer: BLUE CROSS/BLUE SHIELD | Admitting: Physical Therapy

## 2016-06-02 DIAGNOSIS — M25612 Stiffness of left shoulder, not elsewhere classified: Secondary | ICD-10-CM

## 2016-06-02 DIAGNOSIS — M6281 Muscle weakness (generalized): Secondary | ICD-10-CM

## 2016-06-02 DIAGNOSIS — M25611 Stiffness of right shoulder, not elsewhere classified: Secondary | ICD-10-CM | POA: Diagnosis not present

## 2016-06-02 DIAGNOSIS — M25512 Pain in left shoulder: Secondary | ICD-10-CM

## 2016-06-02 DIAGNOSIS — M25511 Pain in right shoulder: Secondary | ICD-10-CM

## 2016-06-02 NOTE — Therapy (Signed)
[place], [place], [address] Phone: [phone]   Fax:  [phone]  Physical Therapy Treatment  Patient Details  Name: Lindsay [name] MRN: [id number] Date of Birth: Dec 25, 1965 Referring Provider: Donne Hazel  Encounter Date: 06/02/2016      PT End of Session - 06/02/16 1414    Visit Number 3   Number of Visits 9   Date for PT Re-Evaluation 06/24/16   PT Start Time 1300   PT Stop Time 1408   PT Time Calculation (min) 68 min   Activity Tolerance Patient tolerated treatment well   Behavior During Therapy [facility] for tasks assessed/performed      Past Medical History:  Diagnosis Date  . Allergy    seasonal  . Arthritis   . Breast cancer of upper-outer quadrant of right female breast ([place]) 12/16/2015  . Depression    mild depression after death of daughter- no meds  . Hot flashes   . Hx of adenomatous colonic polyps 04/25/2014  . Hypertension     Past Surgical History:  Procedure Laterality Date  . ABDOMINAL HYSTERECTOMY    . BREAST LUMPECTOMY WITH RADIOACTIVE SEED AND SENTINEL LYMPH NODE BIOPSY Right 05/05/2016   Procedure: BREAST LUMPECTOMY WITH RADIOACTIVE SEED AND SENTINEL LYMPH NODE BIOPSY;  Surgeon: Rolm Bookbinder, MD;  Location: [facility];  Service: General;  Laterality: Right;  . CHOLECYSTECTOMY N/A 09/09/2015   Procedure: LAPAROSCOPIC CHOLECYSTECTOMY;  Surgeon: Ralene Ok, MD;  [facility];  Service: General;  Laterality: N/A;  . COLONOSCOPY    . KNEE ARTHROSCOPY  2016  . PERIPHERAL VASCULAR CATHETERIZATION Right 05/05/2016   Procedure: PORTA CATH REMOVAL;  Surgeon: Rolm Bookbinder, MD;  Location: [facility];  Service: General;  Laterality: Right;  . PORTACATH PLACEMENT Right 12/29/2015   Procedure: INSERTION PORT-A-CATH WITH ULTRASOUND GUIDANCE;  Surgeon: Rolm Bookbinder, MD;  Location: [facility];  Service: General;  Laterality: Right;    There  were no vitals filed for this visit.      Subjective Assessment - 06/02/16 1304    Subjective Asks if she could do more at home and less therapy visits because of being out of work and the expense of therapy.  Doing dowel exercises at home, and shaysshe can tell the difference already with greater ease putting on her jacket.   Currently in Pain? No/denies            [facility] PT Assessment - 06/02/16 0001      AROM   Right Shoulder Flexion 135 Degrees   Right Shoulder ABduction 100 Degrees  into slight scaption   Left Shoulder Internal Rotation 48 Degrees  approx., in sitting                     [facility] PT Treatment/Exercise - 06/02/16 0001      Exercises   Other Exercises  Taught patient and had her perform all stretches from strength ABC program x 15 seconds each side; core exercises x 10 each with modification in standing for superwoman; resistance exercises with 1 lb. weights except body weight resistance only for squats, heel raises, and step-ups, all for 10 reps each; subsituted standing active hip abduction for dead lifts with no added weight x 10 each side.     Shoulder Exercises: Supine   Flexion AAROM;Both;5 reps  with dowel   ABduction AAROM;Right;5 reps;Left  PT Education - 06/02/16 1414    Education provided Yes   Education Details reviewed dowel stretching exercises; encouraged her to attend ABC class; instructed in strength ABC program   Person(s) Educated Patient   Methods Explanation;Handout;Demonstration   Comprehension Verbalized understanding;Returned demonstration                [facility] Goals - 06/02/16 1417      CC Long Term Goal  #5   Title Patient to be independent in a home exercise program for continued strengthening and stretching   Status Partially Met            Plan - 06/02/16 1415    Clinical Impression Statement Patient expresses concern about the cost of therapy and whether  she can do more at home and come to therapy less frequently.  She has made gains in shoulder AROM from evaluation to today.  Today she was given information about our free ABC class to learn more stretches, and was instructed in strength ABC program to be done independently at home.  I asked if she could do this and come back in two weeks for follow-up, and she agreed.   Rehab Potential Good   Clinical Impairments Affecting Rehab Potential pt to begin radiation soon   PT Frequency 2x / week   PT Duration 4 weeks   PT Treatment/Interventions ADLs/Self Care Home Management;Iontophoresis 53m/ml Dexamethasone;Therapeutic activities;Therapeutic exercise;Patient/family education;Manual techniques;Scar mobilization;Passive range of motion   PT Next Visit Plan Remeasure ROM and check goals; review HEP (stretches, strength ABC) and progress as needed; decide with patient whether she needs more follow-up or not.   Consulted and Agree with Plan of Care Patient      Patient will benefit from skilled therapeutic intervention in order to improve the following deficits and impairments:  Pain, Decreased scar mobility, Impaired UE functional use, Decreased strength, Decreased range of motion, Decreased knowledge of precautions  Visit Diagnosis: Stiffness of right shoulder, not elsewhere classified  Acute pain of right shoulder  Acute pain of left shoulder  Stiffness of left shoulder, not elsewhere classified  Muscle weakness (generalized)     Problem List Patient Active Problem List   Diagnosis Date Noted  . Genetic testing 03/20/2016  . Family history of breast cancer in female 02/10/2016  . Malignant neoplasm of upper-outer quadrant of right breast in female, estrogen receptor negative ([place] 12/16/2015  . Hx of adenomatous colonic polyps 04/25/2014  . Neck pain 11/06/2012    SALISBUR[place],DONNA 06/02/2016, 2:18 PM  [place] [address]Phone: [phone]  Fax:  [phone] Name: Lindsay GILLYARDMRN: [id number] of Birth: [birth year] DSerafina Royals PT 06/02/16 2:18 PM

## 2016-06-03 NOTE — Progress Notes (Signed)
Location of Breast Cancer: upper outer quadrant of right breast    Histology per Pathology Report:   05/05/16 Diagnosis 1. Breast, lumpectomy, Right - INVASIVE DUCTAL CARCINOMA, GRADE III/III, SPANNING 2.7 CM. - DUCTAL CARCINOMA IN SITU, HIGH GRADE. - THE SURGICAL RESECTION MARGINS ARE NEGATIVE FOR CARCINOMA. - SEE ONCOLOGY TABLE BELOW. 2. Lymph node, sentinel, biopsy, Right axillary #1 - THERE IS NO EVIDENCE OF CARCINOMA IN 1 OF 1 LYMPH NODE (0/1). 3. Lymph node, sentinel, biopsy, Right axillary #2 - THERE IS NO EVIDENCE OF CARCINOMA IN 1 OF 1 LYMPH NODE (0/1). 4. Breast, excision, Right additional posterior margin - BENIGN BREAST PARENCHYMA WITH FIBROCYSTIC CHANGES. - ONE BENIGN LYMPH NODE (0/1). - THERE IS NO EVIDENCE OF MALIGNANCY. - SEE COMMENT. 5. Breast, excision, Right additional inferior margin - FIBROCYSTIC CHANGES WITH ADENOSIS AND CALCIFICATIONS. - THERE IS NO EVIDENCE OF MALIGNANCY. - SEE COMMENT.  12/14/15 Diagnosis 1. Breast, right, needle core biopsy, 9 o'clock mass, 14 cm fn INVASIVE DUCTAL CARCINOMA, GRADE 2 TO 3 2. Lymph node, needle/core biopsy, right axilla ONE LYMPH NODE, NEGATIVE FOR MALIGNANCY 3. Breast, right, needle core biopsy, right mass, 10 cm fn BENIGN FIBROADIPOSE TISSUE  Receptor Status: ER(0%), PR (0%), Her2-neu (negative), Ki-(70%)  Did patient present with symptoms (if so, please note symptoms) or was this found on screening mammography?: screening mammogram  Past/Anticipated interventions by surgeon:  05/05/16 - Procedure: BREAST LUMPECTOMY WITH RADIOACTIVE SEED AND SENTINEL LYMPH NODE BIOPSY;  Surgeon: Rolm Bookbinder, MD    Past/Anticipated interventions by medical oncology, if any: neoadjuvant chemotherapy consisting of doxorubicin and cyclophosphamide in dose this fashion 4, completed 02/22/2016, followed by paclitaxel weekly 5 (of 12 treatments planned), starting 03/08/2016, stopped 04/05/2016 after cycle 5 because of neuropathy.  cycle 4 of cyclophosphamide and doxorubicin held 1 week because of side effects.  Lymphedema issues, if any:  No   Pain issues, if any:  No  SAFETY ISSUES:  Prior radiation? No  Pacemaker/ICD? No  Possible current pregnancy? No}  Is the patient on methotrexate? No  Current Complaints / other details:  Menarche 11,G 1, P 2, BC 1 year, Menopause 38,HRT No Grandmother, aunt and cousin had breast cancer Wt Readings from Last 3 Encounters:  06/06/16 206 lb 12.8 oz (93.8 kg)  05/31/16 206 lb 9.6 oz (93.7 kg)  05/05/16 205 lb (93 kg)  BP 129/75   Pulse 83   Temp 98.5 F (36.9 C) (Oral)   Resp 18   Ht 5' 6"  (1.676 m)   Wt 206 lb 12.8 oz (93.8 kg)   SpO2 100%   BMI 33.38 kg/m     Jacqulyn Liner, RN 06/03/2016,8:27 AM

## 2016-06-06 ENCOUNTER — Encounter: Payer: Self-pay | Admitting: Radiation Oncology

## 2016-06-06 ENCOUNTER — Ambulatory Visit: Payer: BLUE CROSS/BLUE SHIELD | Admitting: Physical Therapy

## 2016-06-06 ENCOUNTER — Ambulatory Visit
Admission: RE | Admit: 2016-06-06 | Discharge: 2016-06-06 | Disposition: A | Discharge status: Home / Self Care | Payer: BLUE CROSS/BLUE SHIELD | Source: Ambulatory Visit | Attending: Radiation Oncology | Admitting: Radiation Oncology

## 2016-06-06 DIAGNOSIS — C50411 Malignant neoplasm of upper-outer quadrant of right female breast: Secondary | ICD-10-CM

## 2016-06-06 DIAGNOSIS — Z171 Estrogen receptor negative status [ER-]: Principal | ICD-10-CM

## 2016-06-06 DIAGNOSIS — Z79899 Other long term (current) drug therapy: Secondary | ICD-10-CM | POA: Diagnosis not present

## 2016-06-06 DIAGNOSIS — Z510 Encounter for antineoplastic radiation therapy: Secondary | ICD-10-CM | POA: Diagnosis not present

## 2016-06-06 NOTE — Progress Notes (Signed)
Please see the Nurse Progress Note in the MD Initial Consult Encounter for this patient. 

## 2016-06-06 NOTE — Progress Notes (Signed)
Radiation Oncology         [phone] ________________________________  Name: Lindsay [name] MRN: [id number]  Date: 06/06/2016  DOB: Feb 03, 1966  Re-evaluation Note  CC: No PCP Per Patient  Magrinat, Virgie Dad, MD    ICD-9-CM ICD-10-CM   1. Malignant neoplasm of upper-outer quadrant of right breast in female, estrogen receptor negative ([place]) 174.4 C50.411    V86.1 Z17.1     Diagnosis:  Stage ypT2, ypN0, Mx grade 2-3 invasive ductal carcinoma of the right breast (triple negative)  Narrative:  The patient returns today for re-evaluation since breast clinic on 12/23/15. Since clinic, the patient completed neoadjuvant chemotherapy consisting of Doxorubicin and Cyclophosphamide x 4 on 02/22/16. Cycle 4 of Cyclophosphamide and Doxorubicin was held 1 week because of side effects. This was followed by Paclitaxel x 5 (of 12 treatments planned) from 03/08/16-04/05/16. This was stopped after the 5th cycle due to neuropathy. She then underwent right breast lumpectomy with sentinel lymph node biopsy on 05/05/16. This revealed high grade invasive ductal carcinoma, grade III, spanning 2.7 cm. The margins were negative. Sentinel lymph node biopsy showed no evidence of carcinoma in 2 lymph nodes (0/2).  Patient notes she is going to physical therapy twice a week.  Patient denies lymphedema or pain at this time.                        ALLERGIES:  is allergic to percocet [oxycodone-acetaminophen]; tramadol; vicodin [hydrocodone-acetaminophen]; gabapentin; and pollen extract.  Meds: Current Outpatient Prescriptions  Medication Sig Dispense Refill  . fexofenadine (ALLEGRA) 180 MG tablet Take 180 mg by mouth daily.    Marland Kitchen lisinopril-hydrochlorothiazide (PRINZIDE,ZESTORETIC) 20-12.5 MG per tablet Take 1 tablet by mouth daily.    . meloxicam (MOBIC) 15 MG tablet Take 1 tablet (15 mg total) by mouth daily as needed for pain. 30 tablet 0  . Triamcinolone Acetonide (NASACORT ALLERGY 24HR NA) Place 1-2 sprays into  the nose daily as needed (allergies).    . EPINEPHrine (EPIPEN 2-PAK) 0.3 mg/0.3 mL IJ SOAJ injection Inject 0.3 mg into the muscle once as needed (anaphylaxis allergic reaction).     Marland Kitchen HYDROmorphone (DILAUDID) 4 MG tablet Take 0.5 tablets (2 mg total) by mouth every 6 (six) hours as needed for severe pain. (Patient not taking: Reported on 05/27/2016) 10 tablet 0  . promethazine (PHENERGAN) 12.5 MG tablet Take 1 tablet (12.5 mg total) by mouth every 6 (six) hours as needed for nausea or vomiting. (Patient not taking: Reported on 05/27/2016) 30 tablet 0   No current facility-administered medications for this encounter.     Physical Findings: The patient is in no acute distress. Patient is alert and oriented.  height is 5\' 6"  (1.676 m) and weight is 206 lb 12.8 oz (93.8 kg). Her oral temperature is 98.5 F (36.9 C). Her blood pressure is 129/75 and her pulse is 83. Her respiration is 18 and oxygen saturation is 100%. . Lungs are clear to auscultation bilaterally. Heart has regular rate and rhythm. No palpable cervical, supraclavicular, or axillary adenopathy. Abdomen soft, non-tender, normal bowel sounds. Left breast is large and pendulous without mass or nipple discharge. In the right breast, large and pedulous,  there is a well healed scar in upper outer quadrant at approximately the 10 o'clock position, and a separate scar in the axilla from sentinel lymph node procedure. No palpable mass, nipple discharge, or bleeding.  Lab Findings: Lab Results  Component Value Date   WBC 2.7 (L)  04/15/2016   HGB 11.1 (L) 04/15/2016   HCT 34.2 (L) 04/15/2016   MCV 91.0 04/15/2016   PLT 371 04/15/2016    Radiographic Findings: No results found.  Impression: Stage ypT2c, ypN0, Mx grade 2-3 invasive ductal carcinoma of the right breast (triple negative). The patient would be a good candidate for breast conservation with radiation therapy directed at the right breast. I discussed the course of treatment, side  effects, and potential toxicities with the patient. She appears to understand and wishes to proceed with treatment. A consent form was signed and a copy was placed in the patient's chart.  Plan: CT simulation and treatment planning scheduled for 06/09/16 at 11 am. Anticipate treatment to begin 6 weeks post-op. Due to the patient's large and pendulous breasts, standard fractionation is recommended,  possibly even a prone position for treatment. Anticipate approximately 6 weeks of radiation therapy.  ____________________________________  This document serves as a record of services personally performed by Gery Pray, MD. It was created on his behalf by Bethann Humble, a trained medical scribe. The creation of this record is based on the scribe's personal observations and the provider's statements to them. This document has been checked and approved by the attending provider.

## 2016-06-09 ENCOUNTER — Ambulatory Visit
Admission: RE | Admit: 2016-06-09 | Discharge: 2016-06-09 | Disposition: A | Discharge status: Home / Self Care | Payer: BLUE CROSS/BLUE SHIELD | Source: Ambulatory Visit | Attending: Radiation Oncology | Admitting: Radiation Oncology

## 2016-06-09 DIAGNOSIS — Z510 Encounter for antineoplastic radiation therapy: Secondary | ICD-10-CM | POA: Diagnosis not present

## 2016-06-09 DIAGNOSIS — C50411 Malignant neoplasm of upper-outer quadrant of right female breast: Secondary | ICD-10-CM

## 2016-06-09 DIAGNOSIS — Z171 Estrogen receptor negative status [ER-]: Principal | ICD-10-CM

## 2016-06-09 NOTE — Progress Notes (Signed)
  Radiation Oncology         [phone]) [phone] ________________________________  Name: Lindsay [name] MRN: [id number]  Date: 06/09/2016  DOB: 07/18/1965  SIMULATION AND TREATMENT PLANNING NOTE    ICD-9-CM ICD-10-CM   1. Malignant neoplasm of upper-outer quadrant of right breast in female, estrogen receptor negative ([facility]) 174.4 C50.411    V86.1 Z17.1     DIAGNOSIS:  Stage ypT2, ypN0, Mx grade 2-3 invasive ductal carcinoma of the right breast (triple negative)  NARRATIVE:  The patient was brought to the [facility].  Identity was confirmed.  All relevant records and images related to the planned course of therapy were reviewed.  The patient freely provided informed written consent to proceed with treatment after reviewing the details related to the planned course of therapy. The consent form was witnessed and verified by the simulation staff.  Then, the patient was set-up in a stable reproducible  supine position for radiation therapy.  CT images were obtained.  Surface markings were placed.  The CT images were loaded into the planning software.  Then the target and avoidance structures were contoured.  Treatment planning then occurred.  The radiation prescription was entered and confirmed.  Then, I designed and supervised the construction of a total of 3 medically necessary complex treatment devices.  I have requested : 3D Simulation  I have requested a DVH of the following structures: lumpectomy cavity, heart, and lungs.  I have ordered:dose calc.  PLAN:  The patient will receive 50.4 Gy in 28 fractions followed by a boost to the lumpectomy cavity of 10 Gy.  -----------------------------------  Blair Promise, PhD, MD  This document serves as a record of services personally performed by Gery Pray, MD. It was created on his behalf by Bethann Humble, a trained medical scribe. The creation of this record is based on the scribe's personal observations and the provider's statements to  them. This document has been checked and approved by the attending provider.

## 2016-06-10 ENCOUNTER — Encounter: Payer: Managed Care, Other (non HMO) | Admitting: Physical Therapy

## 2016-06-13 ENCOUNTER — Telehealth: Payer: Self-pay | Admitting: Oncology

## 2016-06-13 ENCOUNTER — Encounter: Payer: Managed Care, Other (non HMO) | Admitting: Physical Therapy

## 2016-06-13 NOTE — Telephone Encounter (Signed)
Received [employer] disability forms from patient to be completed

## 2016-06-14 ENCOUNTER — Encounter: Payer: Self-pay | Admitting: Oncology

## 2016-06-14 NOTE — Progress Notes (Signed)
Faxed disability paperwork to [employer] at [phone]

## 2016-06-15 DIAGNOSIS — Z510 Encounter for antineoplastic radiation therapy: Secondary | ICD-10-CM | POA: Diagnosis not present

## 2016-06-17 ENCOUNTER — Ambulatory Visit: Payer: BLUE CROSS/BLUE SHIELD | Attending: General Surgery | Admitting: Physical Therapy

## 2016-06-17 ENCOUNTER — Ambulatory Visit
Admission: RE | Admit: 2016-06-17 | Discharge: 2016-06-17 | Disposition: A | Discharge status: Home / Self Care | Payer: BLUE CROSS/BLUE SHIELD | Source: Ambulatory Visit | Attending: Radiation Oncology | Admitting: Radiation Oncology

## 2016-06-17 DIAGNOSIS — M25611 Stiffness of right shoulder, not elsewhere classified: Secondary | ICD-10-CM | POA: Diagnosis present

## 2016-06-17 DIAGNOSIS — M25511 Pain in right shoulder: Secondary | ICD-10-CM | POA: Insufficient documentation

## 2016-06-17 DIAGNOSIS — Z510 Encounter for antineoplastic radiation therapy: Secondary | ICD-10-CM | POA: Diagnosis not present

## 2016-06-17 NOTE — Patient Instructions (Signed)

## 2016-06-17 NOTE — Therapy (Signed)
[place], [place], [address] Phone: [phone]   Fax:  [phone]  Physical Therapy Treatment  Patient Details  Name: Lindsay [name] MRN: [id number] Date of Birth: Jul 05, 1965 Referring Provider: Donne Hazel  Encounter Date: 06/17/2016      PT End of Session - 06/17/16 1209    Visit Number 4   Number of Visits 9   Date for PT Re-Evaluation 06/24/16   PT Start Time 0807   PT Stop Time 0845   PT Time Calculation (min) 38 min   Activity Tolerance Patient tolerated treatment well   Behavior During Therapy [facility] for tasks assessed/performed      Past Medical History:  Diagnosis Date  . Allergy    seasonal  . Arthritis   . Breast cancer of upper-outer quadrant of right female breast ([place]) 12/16/2015  . Depression    mild depression after death of daughter- no meds  . Hot flashes   . Hx of adenomatous colonic polyps 04/25/2014  . Hypertension     Past Surgical History:  Procedure Laterality Date  . ABDOMINAL HYSTERECTOMY    . BREAST LUMPECTOMY WITH RADIOACTIVE SEED AND SENTINEL LYMPH NODE BIOPSY Right 05/05/2016   Procedure: BREAST LUMPECTOMY WITH RADIOACTIVE SEED AND SENTINEL LYMPH NODE BIOPSY;  Surgeon: Rolm Bookbinder, MD;  Location: [facility];  Service: General;  Laterality: Right;  . CHOLECYSTECTOMY N/A 09/09/2015   Procedure: LAPAROSCOPIC CHOLECYSTECTOMY;  Surgeon: Ralene Ok, MD;  Location[facility];  Service: General;  Laterality: N/A;  . COLONOSCOPY    . KNEE ARTHROSCOPY  2016  . PERIPHERAL VASCULAR CATHETERIZATION Right 05/05/2016   Procedure: PORTA CATH REMOVAL;  Surgeon: Rolm Bookbinder, MD;  Location: [facility];  Service: General;  Laterality: Right;  . PORTACATH PLACEMENT Right 12/29/2015   Procedure: INSERTION PORT-A-CATH WITH ULTRASOUND GUIDANCE;  Surgeon: Rolm Bookbinder, MD;  Location: [facility];  Service: General;  Laterality: Right;    There  were no vitals filed for this visit.      Subjective Assessment - 06/17/16 0809    Subjective Pt states she is doing good.  She has been doing the exercises twice a week.  She hasn't been doing any other exercise.  She missed the ABC class and knows the [place] program and free classes at the cancer center  She is starting radition on Monday.    Pertinent History R lumpectomy and sentinel lymph node biopsy on 05/05/16, she underwent chemotherapy prior to the lumpectomy, pt will undergo radiation in approx 3-4 more weeks, torn meniscus L knee with surgical repair,    Patient Stated Goals to get my range of motion back in my arm   Currently in Pain? No/denies            [facility] PT Assessment - 06/17/16 0001      Observation/Other Assessments   Quick DASH  4.55     AROM   Right Shoulder Flexion 135 Degrees   Right Shoulder ABduction 140 Degrees   Right Shoulder External Rotation 85 Degrees              Quick Dash - 06/17/16 0001    Open a tight or new jar Mild difficulty   Do heavy household chores (wash walls, wash floors) No difficulty   Carry a shopping bag or briefcase No difficulty   Wash your back No difficulty   Use a knife to cut food No difficulty   Recreational activities in which you  take some force or impact through your arm, shoulder, or hand (golf, hammering, tennis) No difficulty   During the past week, to what extent has your arm, shoulder or hand problem interfered with your normal social activities with family, friends, neighbors, or groups? Not at all   During the past week, to what extent has your arm, shoulder or hand problem limited your work or other regular daily activities Not at all   Arm, shoulder, or hand pain. None   Tingling (pins and needles) in your arm, shoulder, or hand Mild   Difficulty Sleeping No difficulty   DASH Score 4.55 %               [facility] Adult PT Treatment/Exercise - 06/17/16 0001      Shoulder Exercises: Supine    Flexion AAROM;Both;5 reps  with dowel   ABduction AAROM;Right;5 reps;Left   Other Supine Exercises goalpost arm stretch with pillows under forarm and towel toll under elbow for prolonged stretch at anterior chest    Other Supine Exercises hand behing head, head rotatated toward left wtih pillow supporting elbow to simulate radiation stretch.      Shoulder Exercises: Sidelying   ABduction AROM;Right;5 reps  slow stretch with deep breath at the top    Other Sidelying Exercises upper thoraic backward rotation to anterior chest and thoracici stretch    Other Sidelying Exercises small circles with arm pointed to ceiling      Shoulder Exercises: Standing   Flexion Right;5 reps   Flexion Limitations wall strech , wall washing    Other Standing Exercises doorway stretch for anterior chest stretch                 PT Education - 06/17/16 1209    Education provided Yes   Education Details standing shoulder stretches    Person(s) Educated Patient   Methods Explanation;Demonstration;Handout   Comprehension Verbalized understanding;Returned demonstration                [facility] Goals - 06/17/16 1212      CC Long Term Goal  #1   Title Pt to demonstrate 160 degrees of right shoulder flexion to allow her to reach items over her stove without difficulty.   Baseline 126 on eval, 135 at discharge    Status Partially Met     CC Long Term Goal  #2   Title Pt to demonstrate 150 degrees of right shoulder abduction to allow her to reach items out to her sides   Baseline 93 on eval, 140 degrees at discharge    Status Partially Met     CC Long Term Goal  #3   Title Pt to demonstrate 50 degrees of left shoulder IR to allow her to return to prior level of function   Baseline 22 not assessed at discharge    Status Deferred     CC Long Term Goal  #4   Title Patient to independently verbalize lymphedema risk reduction practices   Baseline pt has not attended ABC class but plans  to , basics reviewed duing sessions    Status Partially Met     CC Long Term Goal  #5   Title Patient to be independent in a home exercise program for continued strengthening and stretching   Status Achieved            Plan - 06/17/16 1210    Clinical Impression Statement Pt reports she is doing well and is able to  use her arm more.  She is doing exercises but is considering doing yoga at cancer center or more exercises at [facility] once radiation is over.  She feels that she is ready to discharge from PT    Rehab Potential Good   Clinical Impairments Affecting Rehab Potential pt to begin radiation soon   PT Frequency 2x / week   PT Duration 4 weeks   PT Treatment/Interventions ADLs/Self Care Home Management;Iontophoresis 39m/ml Dexamethasone;Therapeutic activities;Therapeutic exercise;Patient/family education;Manual techniques;Scar mobilization;Passive range of motion   PT Next Visit Plan Discharge from PT    Consulted and Agree with Plan of Care Patient      Patient will benefit from skilled therapeutic intervention in order to improve the following deficits and impairments:  Pain, Decreased scar mobility, Impaired UE functional use, Decreased strength, Decreased range of motion, Decreased knowledge of precautions  Visit Diagnosis: Stiffness of right shoulder, not elsewhere classified  Acute pain of right shoulder     Problem List Patient Active Problem List   Diagnosis Date Noted  . Genetic testing 03/20/2016  . Family history of breast cancer in female 02/10/2016  . Malignant neoplasm of upper-outer quadrant of right breast in [place], [place] 12/16/2015  . Hx of adenomatous colonic polyps 04/25/2014  . Neck pain 11/06/2012   PHYSICAL THERAPY DISCHARGE SUMMARY  Visits from Start of Care: 4  Current functional level related to goals / functional outcomes: Pt pleased with progress and feels that she can continue on her own    Remaining deficits: Decreased shoulder range of motion and strength    Education / Equipment: Home exercise  Plan: Patient agrees to discharge.  Patient goals were partially met. Patient is being discharged due to being pleased with the current functional level.  ?????    TDonato Heinz BOwens SharkPT  BNorwood Levo2/01/2017, 12:15 PM  [place] [address]Phone: [phone]  Fax:  [phone] Name: Lindsay ZAPPIAMRN: [id number] of Birth: 4Apr 08, 1967

## 2016-06-20 ENCOUNTER — Ambulatory Visit
Admission: RE | Admit: 2016-06-20 | Discharge: 2016-06-20 | Disposition: A | Discharge status: Home / Self Care | Payer: BLUE CROSS/BLUE SHIELD | Source: Ambulatory Visit | Attending: Radiation Oncology | Admitting: Radiation Oncology

## 2016-06-20 DIAGNOSIS — Z510 Encounter for antineoplastic radiation therapy: Secondary | ICD-10-CM | POA: Diagnosis not present

## 2016-06-21 ENCOUNTER — Encounter: Payer: Self-pay | Admitting: Radiation Oncology

## 2016-06-21 ENCOUNTER — Ambulatory Visit
Admission: RE | Admit: 2016-06-21 | Discharge: 2016-06-21 | Disposition: A | Discharge status: Home / Self Care | Payer: BLUE CROSS/BLUE SHIELD | Source: Ambulatory Visit | Attending: Radiation Oncology | Admitting: Radiation Oncology

## 2016-06-21 ENCOUNTER — Inpatient Hospital Stay
Admission: RE | Admit: 2016-06-21 | Discharge: 2016-06-21 | Disposition: A | Discharge status: Home / Self Care | Payer: Self-pay | Source: Ambulatory Visit | Attending: Radiation Oncology | Admitting: Radiation Oncology

## 2016-06-21 VITALS — BP 113/76 | HR 71 | Temp 98.4°F | Resp 16 | Ht 66.0 in | Wt 207.8 lb

## 2016-06-21 DIAGNOSIS — Z171 Estrogen receptor negative status [ER-]: Principal | ICD-10-CM

## 2016-06-21 DIAGNOSIS — C50411 Malignant neoplasm of upper-outer quadrant of right female breast: Secondary | ICD-10-CM

## 2016-06-21 DIAGNOSIS — Z510 Encounter for antineoplastic radiation therapy: Secondary | ICD-10-CM | POA: Diagnosis not present

## 2016-06-21 MED ORDER — RADIAPLEXRX EX GEL
Freq: Once | CUTANEOUS | Status: AC
  Administered 2016-06-21: 12:00:00 via TOPICAL

## 2016-06-21 MED ORDER — ALRA NON-METALLIC DEODORANT (RAD-ONC)
1.0000 "application " | Freq: Once | TOPICAL | Status: AC
  Administered 2016-06-21: 1 via TOPICAL

## 2016-06-21 NOTE — Progress Notes (Signed)
Lindsay [name] has completed 2 fractions to her right breast.  She denies having pain.  She reports having fatigue yesterday.  The skin on her right breast is intact.  BP 113/76   Pulse 71   Temp 98.4 F (36.9 C) (Oral)   Resp 16   Ht 5\' 6"  (1.676 m)   Wt 207 lb 12.8 oz (94.3 kg)   SpO2 100%   BMI 33.54 kg/m    Wt Readings from Last 3 Encounters:  06/21/16 207 lb 12.8 oz (94.3 kg)  06/06/16 206 lb 12.8 oz (93.8 kg)  05/31/16 206 lb 9.6 oz (93.7 kg)

## 2016-06-21 NOTE — Progress Notes (Signed)
  Radiation Oncology         [phone] ________________________________  Name: Lindsay [name] MRN: [id number]  Date: 06/21/2016  DOB: 1965/08/18  Weekly Radiation Therapy Management    ICD-9-CM ICD-10-CM   1. Malignant neoplasm of upper-outer quadrant of right breast in female, estrogen receptor negative ([facility]) 174.4 C50.411    V86.1 Z17.1      Current Dose: 3.6 Gy     Planned Dose:  60.4 Gy  Narrative . . . . . . . . The patient presents for routine under treatment assessment.                                    The patient has completed 2 fractions to her right breast. She denies pain and reports fatigue yesterday. The nurse notes the skin on her right breast is intact.                                  Set-up films were reviewed.                                 The chart was checked. Physical Findings. . .  height is 5\' 6"  (1.676 m) and weight is 207 lb 12.8 oz (94.3 kg). Her oral temperature is 98.4 F (36.9 C). Her blood pressure is 113/76 and her pulse is 71. Her respiration is 16 and oxygen saturation is 100%. . Weight essentially stable. Lungs are clear to auscultation bilaterally. Heart has regular rate and rhythm. No skin reaction at this time. Impression . . . . . . . The patient is tolerating radiation. Plan . . . . . . . . . . . . Continue treatment as planned.  ________________________________   Blair Promise, PhD, MD  This document serves as a record of services personally performed by Gery Pray, MD. It was created on his behalf by Darcus Austin, a trained medical scribe. The creation of this record is based on the scribe's personal observations and the provider's statements to them. This document has been checked and approved by the attending provider.

## 2016-06-21 NOTE — Progress Notes (Signed)
Pt here for patient teaching.  Pt given Radiation and You booklet, skin care instructions, Alra deodorant and Radiaplex gel.  Reviewed areas of pertinence such as fatigue, skin changes, breast tenderness and breast swelling . Pt able to give teach back of to pat skin and use unscented/gentle soap,apply Radiaplex bid and avoid applying anything to skin within 4 hours of treatment. Pt demonstrated understanding and verbalizes understanding of information given and will contact nursing with any questions or concerns.          

## 2016-06-22 ENCOUNTER — Ambulatory Visit
Admission: RE | Admit: 2016-06-22 | Discharge: 2016-06-22 | Disposition: A | Discharge status: Home / Self Care | Payer: BLUE CROSS/BLUE SHIELD | Source: Ambulatory Visit | Attending: Radiation Oncology | Admitting: Radiation Oncology

## 2016-06-22 DIAGNOSIS — Z510 Encounter for antineoplastic radiation therapy: Secondary | ICD-10-CM | POA: Diagnosis not present

## 2016-06-23 ENCOUNTER — Ambulatory Visit
Admission: RE | Admit: 2016-06-23 | Discharge: 2016-06-23 | Disposition: A | Discharge status: Home / Self Care | Payer: BLUE CROSS/BLUE SHIELD | Source: Ambulatory Visit | Attending: Radiation Oncology | Admitting: Radiation Oncology

## 2016-06-23 DIAGNOSIS — Z510 Encounter for antineoplastic radiation therapy: Secondary | ICD-10-CM | POA: Diagnosis not present

## 2016-06-24 ENCOUNTER — Ambulatory Visit
Admission: RE | Admit: 2016-06-24 | Discharge: 2016-06-24 | Disposition: A | Discharge status: Home / Self Care | Payer: BLUE CROSS/BLUE SHIELD | Source: Ambulatory Visit | Attending: Radiation Oncology | Admitting: Radiation Oncology

## 2016-06-24 DIAGNOSIS — Z510 Encounter for antineoplastic radiation therapy: Secondary | ICD-10-CM | POA: Diagnosis not present

## 2016-06-24 NOTE — Progress Notes (Signed)
Lindsay [name] reports having "tightness" in her right arm when she bends it.  She said this started about a week ago.  No edema noted in her upper arm.  Advised her to do her arm/shoulder exercises more frequently over the weekend and to see Dr. Sondra Come on Monday.

## 2016-06-27 ENCOUNTER — Ambulatory Visit (INDEPENDENT_AMBULATORY_CARE_PROVIDER_SITE_OTHER): Payer: Self-pay

## 2016-06-27 ENCOUNTER — Ambulatory Visit (INDEPENDENT_AMBULATORY_CARE_PROVIDER_SITE_OTHER): Payer: BLUE CROSS/BLUE SHIELD | Admitting: Orthopaedic Surgery

## 2016-06-27 ENCOUNTER — Ambulatory Visit
Admission: RE | Admit: 2016-06-27 | Discharge: 2016-06-27 | Disposition: A | Discharge status: Home / Self Care | Payer: BLUE CROSS/BLUE SHIELD | Source: Ambulatory Visit | Attending: Radiation Oncology | Admitting: Radiation Oncology

## 2016-06-27 ENCOUNTER — Encounter (INDEPENDENT_AMBULATORY_CARE_PROVIDER_SITE_OTHER): Payer: Self-pay | Admitting: Orthopaedic Surgery

## 2016-06-27 DIAGNOSIS — M1711 Unilateral primary osteoarthritis, right knee: Secondary | ICD-10-CM

## 2016-06-27 DIAGNOSIS — Z510 Encounter for antineoplastic radiation therapy: Secondary | ICD-10-CM | POA: Diagnosis not present

## 2016-06-27 NOTE — Progress Notes (Signed)
Office Visit Note   Patient: Lindsay [name]           Date of Birth: 1965-11-07           MRN: [id number] Visit Date: 06/27/2016              Requested by: Bernerd Limbo, MD [address] [place], [place] [address] PCP: No PCP Per Patient   Assessment & Plan: Visit Diagnoses:  1. Primary osteoarthritis of right knee     Plan: right knee DJD.  monovisc pamphlet provided today.  HEP.  Will call us if she wants cortisone injection or monovisc.  F/u prn.  Follow-Up Instructions: Return if symptoms worsen or fail to improve.   Orders:  Orders Placed This Encounter  Procedures  . XR KNEE 3 VIEW RIGHT   No orders of the defined types were placed in this encounter.     Procedures: No procedures performed   Clinical Data: No additional findings.   Subjective: Chief Complaint  Patient presents with  . Right Knee - Pain    51 yo female with right knee pain for several months that comes and goes that has gotten worse.  She's had 1 cortisone injection with complete relief for few months.  She recently finished treatment for breast cancer.  Pain is 6/10 pain that's worse with weather changes, it aches and throbs and has catching and locking.  mobic helps.  Pain doesn't radiate.    Review of Systems  Constitutional: Negative.   HENT: Negative.   Eyes: Negative.   Respiratory: Negative.   Cardiovascular: Negative.   Endocrine: Negative.   Musculoskeletal: Negative.   Neurological: Negative.   Hematological: Negative.   Psychiatric/Behavioral: Negative.   All other systems reviewed and are negative.    Objective: Vital Signs: There were no vitals taken for this visit.  Physical Exam  Constitutional: She is oriented to person, place, and time. She appears well-developed and well-nourished.  HENT:  Head: Normocephalic and atraumatic.  Eyes: EOM are normal.  Neck: Neck supple.  Pulmonary/Chest: Effort normal.  Abdominal: Soft.    Neurological: She is alert and oriented to person, place, and time.  Skin: Skin is warm. Capillary refill takes less than 2 seconds.  Psychiatric: She has a normal mood and affect. Her behavior is normal. Judgment and thought content normal.  Nursing note and vitals reviewed.   Ortho Exam Right knee  - trace effusion - full ROM - no joint line tenderness - ligaments normal Specialty Comments:  No specialty comments available.  Imaging: No results found.   PMFS History: Patient Active Problem List   Diagnosis Date Noted  . Genetic testing 03/20/2016  . Family history of breast cancer in female 02/10/2016  . Malignant neoplasm of upper-outer quadrant of right breast in female, estrogen receptor negative ([place]) 12/16/2015  . Hx of adenomatous colonic polyps 04/25/2014  . Neck pain 11/06/2012   Past Medical History:  Diagnosis Date  . Allergy    seasonal  . Arthritis   . Breast cancer of upper-outer quadrant of right female breast ([place]) 12/16/2015  . Depression    mild depression after death of daughter- no meds  . Hot flashes   . Hx of adenomatous colonic polyps 04/25/2014  . Hypertension     Family History  Problem Relation Age of Onset  . Hypertension Mother   . Breast cancer Maternal Grandfather   . Breast cancer Other   .  Breast cancer Cousin   . Sudden death Neg Hx   . Diabetes Neg Hx   . Heart attack Neg Hx   . Hyperlipidemia Neg Hx   . Colon cancer Neg Hx   . Esophageal cancer Neg Hx   . Rectal cancer Neg Hx   . Stomach cancer Neg Hx     Past Surgical History:  Procedure Laterality Date  . ABDOMINAL HYSTERECTOMY    . BREAST LUMPECTOMY WITH RADIOACTIVE SEED AND SENTINEL LYMPH NODE BIOPSY Right 05/05/2016   Procedure: BREAST LUMPECTOMY WITH RADIOACTIVE SEED AND SENTINEL LYMPH NODE BIOPSY;  Surgeon: Rolm Bookbinder, MD;  Location: [facility];  Service: General;  Laterality: Right;  . CHOLECYSTECTOMY N/A 09/09/2015   Procedure: LAPAROSCOPIC  CHOLECYSTECTOMY;  Surgeon: Ralene Ok, MD;  [facility];  Service: General;  Laterality: N/A;  . COLONOSCOPY    . KNEE ARTHROSCOPY  2016  . PERIPHERAL VASCULAR CATHETERIZATION Right 05/05/2016   Procedure: PORTA CATH REMOVAL;  Surgeon: Rolm Bookbinder, MD;  Location: [facility];  Service: General;  Laterality: Right;  . PORTACATH PLACEMENT Right 12/29/2015   Procedure: INSERTION PORT-A-CATH WITH ULTRASOUND GUIDANCE;  Surgeon: Rolm Bookbinder, MD;  Location: [facility];  Service: General;  Laterality: Right;   Social History   Occupational History  . Not on file.   Social History Main Topics  . Smoking status: Never Smoker  . Smokeless tobacco: Never Used  . Alcohol use No  . Drug use: No  . Sexual activity: Yes    Birth control/ protection: Surgical

## 2016-06-28 ENCOUNTER — Ambulatory Visit
Admission: RE | Admit: 2016-06-28 | Discharge: 2016-06-28 | Disposition: A | Discharge status: Home / Self Care | Payer: BLUE CROSS/BLUE SHIELD | Source: Ambulatory Visit | Attending: Radiation Oncology | Admitting: Radiation Oncology

## 2016-06-28 VITALS — BP 108/49 | HR 75 | Temp 98.8°F | Resp 16

## 2016-06-28 DIAGNOSIS — C50411 Malignant neoplasm of upper-outer quadrant of right female breast: Secondary | ICD-10-CM

## 2016-06-28 DIAGNOSIS — Z171 Estrogen receptor negative status [ER-]: Principal | ICD-10-CM

## 2016-06-28 DIAGNOSIS — Z510 Encounter for antineoplastic radiation therapy: Secondary | ICD-10-CM | POA: Diagnosis not present

## 2016-06-28 NOTE — Progress Notes (Signed)
  Radiation Oncology         (336) [phone] ________________________________  Name: Lindsay [name] MRN: [id number]  Date: 06/28/2016  DOB: 1965-06-13  Weekly Radiation Therapy Management    ICD-9-CM ICD-10-CM   1. Malignant neoplasm of upper-outer quadrant of right breast in female, estrogen receptor negative (HCC) 174.4 C50.411    V86.1 Z17.1      Current Dose: 12.6 Gy     Planned Dose:  60.4 Gy  Narrative . . . . . . . . The patient presents for routine under treatment assessment.                                    Lindsay [name] completed 7 treatments to right breast. Denies any pain, but did report tightness in right upper arm with exercises resolving the issue. She reports some fatigue. Mild hyperpigmentation to right breast noted by the nurse.                                  Set-up films were reviewed.                                 The chart was checked. Physical Findings. . .  oral temperature is 98.8 F (37.1 C). Her blood pressure is 108/49 (abnormal) and her pulse is 75. Her respiration is 16 and oxygen saturation is 100%. . Weight essentially stable. Lungs are clear to auscultation bilaterally. Heart has regular rate and rhythm. Mild hyperpigmentation changes of the right breast. Impression . . . . . . . The patient is tolerating radiation. Plan . . . . . . . . . . . . Continue treatment as planned.  ________________________________   Blair Promise, PhD, MD  This document serves as a record of services personally performed by Gery Pray, MD. It was created on his behalf by Darcus Austin, a trained medical scribe. The creation of this record is based on the scribe's personal observations and the provider's statements to them. This document has been checked and approved by the attending provider.

## 2016-06-28 NOTE — Progress Notes (Signed)
Lindsay [name] completed 7 treatments to right breast.  Denies any pain but did report tightness at last visit in right upper arm but exercises resolved issue.  Denies any fatigue.  Mild hyperpigmentation to right breast.    BP (!) 108/49 (BP Location: Left Arm, Patient Position: Sitting)   Pulse 75   Temp 98.8 F (37.1 C) (Oral)   Resp 16   SpO2 100%   Wt Readings from Last 3 Encounters:  06/21/16 207 lb 12.8 oz (94.3 kg)  06/06/16 206 lb 12.8 oz (93.8 kg)  05/31/16 206 lb 9.6 oz (93.7 kg)

## 2016-06-29 ENCOUNTER — Ambulatory Visit
Admission: RE | Admit: 2016-06-29 | Discharge: 2016-06-29 | Disposition: A | Discharge status: Home / Self Care | Payer: BLUE CROSS/BLUE SHIELD | Source: Ambulatory Visit | Attending: Radiation Oncology | Admitting: Radiation Oncology

## 2016-06-29 DIAGNOSIS — Z510 Encounter for antineoplastic radiation therapy: Secondary | ICD-10-CM | POA: Diagnosis not present

## 2016-06-30 ENCOUNTER — Ambulatory Visit
Admission: RE | Admit: 2016-06-30 | Discharge: 2016-06-30 | Disposition: A | Discharge status: Home / Self Care | Payer: BLUE CROSS/BLUE SHIELD | Source: Ambulatory Visit | Attending: Radiation Oncology | Admitting: Radiation Oncology

## 2016-06-30 DIAGNOSIS — Z510 Encounter for antineoplastic radiation therapy: Secondary | ICD-10-CM | POA: Diagnosis not present

## 2016-07-01 ENCOUNTER — Ambulatory Visit
Admission: RE | Admit: 2016-07-01 | Discharge: 2016-07-01 | Disposition: A | Discharge status: Home / Self Care | Payer: BLUE CROSS/BLUE SHIELD | Source: Ambulatory Visit | Attending: Radiation Oncology | Admitting: Radiation Oncology

## 2016-07-01 ENCOUNTER — Encounter: Payer: Self-pay | Admitting: Radiation Oncology

## 2016-07-01 DIAGNOSIS — Z510 Encounter for antineoplastic radiation therapy: Secondary | ICD-10-CM | POA: Diagnosis not present

## 2016-07-01 NOTE — Progress Notes (Signed)
Paperwork (one main) received 07/01/16 given to nursing 2/26

## 2016-07-04 ENCOUNTER — Ambulatory Visit
Admission: RE | Admit: 2016-07-04 | Discharge: 2016-07-04 | Disposition: A | Discharge status: Home / Self Care | Payer: BLUE CROSS/BLUE SHIELD | Source: Ambulatory Visit | Attending: Radiation Oncology | Admitting: Radiation Oncology

## 2016-07-04 ENCOUNTER — Telehealth: Payer: Self-pay | Admitting: Oncology

## 2016-07-04 DIAGNOSIS — Z510 Encounter for antineoplastic radiation therapy: Secondary | ICD-10-CM | POA: Diagnosis not present

## 2016-07-05 ENCOUNTER — Ambulatory Visit
Admission: RE | Admit: 2016-07-05 | Discharge: 2016-07-05 | Disposition: A | Discharge status: Home / Self Care | Payer: BLUE CROSS/BLUE SHIELD | Source: Ambulatory Visit | Attending: Radiation Oncology | Admitting: Radiation Oncology

## 2016-07-05 ENCOUNTER — Encounter: Payer: Self-pay | Admitting: Radiation Oncology

## 2016-07-05 VITALS — BP 114/77 | HR 79 | Temp 98.3°F | Ht 66.0 in | Wt 212.0 lb

## 2016-07-05 DIAGNOSIS — Z510 Encounter for antineoplastic radiation therapy: Secondary | ICD-10-CM | POA: Diagnosis not present

## 2016-07-05 DIAGNOSIS — Z171 Estrogen receptor negative status [ER-]: Principal | ICD-10-CM

## 2016-07-05 DIAGNOSIS — C50411 Malignant neoplasm of upper-outer quadrant of right female breast: Secondary | ICD-10-CM

## 2016-07-05 NOTE — Progress Notes (Signed)
  Radiation Oncology         [phone] ________________________________  Name: Lindsay [name] MRN: [id number]  Date: 07/05/2016  DOB: 11-09-1965  Weekly Radiation Therapy Management    ICD-9-CM ICD-10-CM   1. Malignant neoplasm of upper-outer quadrant of right breast in female, estrogen receptor negative (HCC) 174.4 C50.411    V86.1 Z17.1      Current Dose: 21.6 Gy     Planned Dose:  60.4 Gy  Narrative . . . . . . . . The patient presents for routine under treatment assessment.                                    Lindsay [name] has completed 12 fractions to her right breast. She denies having pain or fatigue.  She reports feeling lumps in her right breast that started after radiation. The nurse notes the skin on her right breast has hyperpigmentation. She is using radiaplex as directed.                                  Set-up films were reviewed.                                 The chart was checked. Physical Findings. . .  height is 5\' 6"  (1.676 m) and weight is 212 lb (96.2 kg). Her oral temperature is 98.3 F (36.8 C). Her blood pressure is 114/77 and her pulse is 79. Her oxygen saturation is 100%. . Weight essentially stable. Lungs are clear to auscultation bilaterally. Heart has regular rate and rhythm. Hyperpigmentation changes of the right breast.  Impression . . . . . . . The patient is tolerating radiation. Plan . . . . . . . . . . . . Continue treatment as planned.  ________________________________   Blair Promise, PhD, MD  This document serves as a record of services personally performed by Gery Pray, MD. It was created on his behalf by Darcus Austin, a trained medical scribe. The creation of this record is based on the scribe's personal observations and the provider's statements to them. This document has been checked and approved by the attending provider.

## 2016-07-05 NOTE — Progress Notes (Signed)
Lindsay [name] has completed 12 fractions to her right breast.  She denies having pain or fatigue.  She is using radiaplex as directed.  She reports feeling lumps in her right breast that started after radiation.  The skin on her right breast has hyperpigmentation.  BP 114/77 (BP Location: Left Arm, Patient Position: Sitting)   Pulse 79   Temp 98.3 F (36.8 C) (Oral)   Ht 5\' 6"  (1.676 m)   Wt 212 lb (96.2 kg)   SpO2 100%   BMI 34.22 kg/m    Wt Readings from Last 3 Encounters:  07/05/16 212 lb (96.2 kg)  06/21/16 207 lb 12.8 oz (94.3 kg)  06/06/16 206 lb 12.8 oz (93.8 kg)

## 2016-07-06 ENCOUNTER — Ambulatory Visit
Admission: RE | Admit: 2016-07-06 | Discharge: 2016-07-06 | Disposition: A | Discharge status: Home / Self Care | Payer: BLUE CROSS/BLUE SHIELD | Source: Ambulatory Visit | Attending: Radiation Oncology | Admitting: Radiation Oncology

## 2016-07-06 DIAGNOSIS — Z510 Encounter for antineoplastic radiation therapy: Secondary | ICD-10-CM | POA: Diagnosis not present

## 2016-07-07 ENCOUNTER — Ambulatory Visit
Admission: RE | Admit: 2016-07-07 | Discharge: 2016-07-07 | Disposition: A | Discharge status: Home / Self Care | Payer: BLUE CROSS/BLUE SHIELD | Source: Ambulatory Visit | Attending: Radiation Oncology | Admitting: Radiation Oncology

## 2016-07-07 DIAGNOSIS — Z510 Encounter for antineoplastic radiation therapy: Secondary | ICD-10-CM | POA: Diagnosis not present

## 2016-07-08 ENCOUNTER — Ambulatory Visit
Admission: RE | Admit: 2016-07-08 | Discharge: 2016-07-08 | Disposition: A | Discharge status: Home / Self Care | Payer: BLUE CROSS/BLUE SHIELD | Source: Ambulatory Visit | Attending: Radiation Oncology | Admitting: Radiation Oncology

## 2016-07-08 DIAGNOSIS — Z510 Encounter for antineoplastic radiation therapy: Secondary | ICD-10-CM | POA: Diagnosis not present

## 2016-07-11 ENCOUNTER — Ambulatory Visit
Admission: RE | Admit: 2016-07-11 | Discharge: 2016-07-11 | Disposition: A | Discharge status: Home / Self Care | Payer: BLUE CROSS/BLUE SHIELD | Source: Ambulatory Visit | Attending: Radiation Oncology | Admitting: Radiation Oncology

## 2016-07-11 ENCOUNTER — Encounter: Payer: Self-pay | Admitting: Radiation Oncology

## 2016-07-11 DIAGNOSIS — Z510 Encounter for antineoplastic radiation therapy: Secondary | ICD-10-CM | POA: Diagnosis not present

## 2016-07-11 NOTE — Progress Notes (Signed)
Paperwork (one main solution) received, faxed to [phone], conf received 07/11/16, copy given to patient

## 2016-07-12 ENCOUNTER — Other Ambulatory Visit: Payer: Self-pay | Admitting: Radiation Oncology

## 2016-07-12 ENCOUNTER — Ambulatory Visit
Admission: RE | Admit: 2016-07-12 | Discharge: 2016-07-12 | Disposition: A | Discharge status: Home / Self Care | Payer: BLUE CROSS/BLUE SHIELD | Source: Ambulatory Visit | Attending: Radiation Oncology | Admitting: Radiation Oncology

## 2016-07-12 ENCOUNTER — Encounter: Payer: Self-pay | Admitting: Radiation Oncology

## 2016-07-12 VITALS — BP 121/81 | HR 80 | Temp 98.2°F | Ht 66.0 in | Wt 207.2 lb

## 2016-07-12 DIAGNOSIS — C50411 Malignant neoplasm of upper-outer quadrant of right female breast: Secondary | ICD-10-CM

## 2016-07-12 DIAGNOSIS — Z510 Encounter for antineoplastic radiation therapy: Secondary | ICD-10-CM | POA: Diagnosis not present

## 2016-07-12 DIAGNOSIS — Z171 Estrogen receptor negative status [ER-]: Principal | ICD-10-CM

## 2016-07-12 NOTE — Progress Notes (Signed)
Lindsay [name] has completed 17 fractions to her right breast.  She denies having pain.  She noticed yesterday some hardened veins in her right antecubital area.  She denies having any lymphedema.  She also denies having fatigue.  She is using radiaplex as directed.  The skin on her right breast has hyperpigmentation.  BP 121/81 (BP Location: Left Arm, Patient Position: Sitting)   Pulse 80   Temp 98.2 F (36.8 C) (Oral)   Ht 5\' 6"  (1.676 m)   Wt 207 lb 3.2 oz (94 kg)   SpO2 100%   BMI 33.44 kg/m    Wt Readings from Last 3 Encounters:  07/12/16 207 lb 3.2 oz (94 kg)  07/05/16 212 lb (96.2 kg)  06/21/16 207 lb 12.8 oz (94.3 kg)

## 2016-07-12 NOTE — Progress Notes (Signed)
  Radiation Oncology         [phone] ________________________________  Name: Lindsay [name] MRN: [id number]  Date: 07/12/2016  DOB: November 17, 1965  Weekly Radiation Therapy Management    ICD-9-CM ICD-10-CM   1. Malignant neoplasm of upper-outer quadrant of right breast in female, estrogen receptor negative (HCC) 174.4 C50.411 Ultrasound doppler venous arms bilateral   V86.1 Z17.1      Current Dose: 30.6 Gy     Planned Dose:  60.4 Gy  Narrative . . . . . . . . The patient presents for routine under treatment assessment.                                    Lindsay [name] has completed 17 fractions to her right breast.  She denies having pain, lymphedema, or fatigue. She noticed some hardened veins in her right antecubital area yesterday. The nurse notes skin on her right breast has hyperpigmentation. She is using radiaplex as directed.                                  Set-up films were reviewed.                                 The chart was checked. Physical Findings. . .  height is 5\' 6"  (1.676 m) and weight is 207 lb 3.2 oz (94 kg). Her oral temperature is 98.2 F (36.8 C). Her blood pressure is 121/81 and her pulse is 80. Her oxygen saturation is 100%. . Weight essentially stable. Lungs are clear to auscultation bilaterally. Heart has regular rate and rhythm. Hyperpigmentation changes of the right breast, no skin breakdown. Thickened firm veins in the antecubital fossa in right upper arm. Some swelling of the right arm. Questionable cords in the right arm Impression . . . . . . . The patient is tolerating radiation. Plan . . . . . . . . . . . . Continue treatment as planned. Patient is scheduled for a Doppler to rule out DVT involving the right upper extremity  ________________________________   Blair Promise, PhD, MD  This document serves as a record of services personally performed by Gery Pray, MD. It was created on his behalf by Darcus Austin, a trained medical scribe. The creation  of this record is based on the scribe's personal observations and the provider's statements to them. This document has been checked and approved by the attending provider.

## 2016-07-13 ENCOUNTER — Ambulatory Visit
Admission: RE | Admit: 2016-07-13 | Discharge: 2016-07-13 | Disposition: A | Discharge status: Home / Self Care | Payer: BLUE CROSS/BLUE SHIELD | Source: Ambulatory Visit | Attending: Radiation Oncology | Admitting: Radiation Oncology

## 2016-07-13 ENCOUNTER — Telehealth: Payer: Self-pay | Admitting: *Deleted

## 2016-07-13 DIAGNOSIS — Z510 Encounter for antineoplastic radiation therapy: Secondary | ICD-10-CM | POA: Diagnosis not present

## 2016-07-13 NOTE — Telephone Encounter (Signed)
CALLED PATIENT TO INFORM OF [place] @ [facility] ON 07-15-16- ARRIVAL TIME - 2:45 PM , NO RESTRICTIONS TO TEST, SPOKE WITH PATIENT AND SHE IS AWARE OF THIS TEST

## 2016-07-14 ENCOUNTER — Telehealth: Payer: Self-pay | Admitting: Oncology

## 2016-07-14 ENCOUNTER — Ambulatory Visit
Admission: RE | Admit: 2016-07-14 | Discharge: 2016-07-14 | Disposition: A | Discharge status: Home / Self Care | Payer: BLUE CROSS/BLUE SHIELD | Source: Ambulatory Visit | Attending: Radiation Oncology | Admitting: Radiation Oncology

## 2016-07-14 DIAGNOSIS — Z510 Encounter for antineoplastic radiation therapy: Secondary | ICD-10-CM | POA: Diagnosis not present

## 2016-07-14 NOTE — Telephone Encounter (Signed)
Received [employer] disability forms to be completed

## 2016-07-15 ENCOUNTER — Ambulatory Visit
Admission: RE | Admit: 2016-07-15 | Discharge: 2016-07-15 | Disposition: A | Discharge status: Home / Self Care | Payer: BLUE CROSS/BLUE SHIELD | Source: Ambulatory Visit | Attending: Radiation Oncology | Admitting: Radiation Oncology

## 2016-07-15 ENCOUNTER — Ambulatory Visit (HOSPITAL_COMMUNITY): Payer: BLUE CROSS/BLUE SHIELD

## 2016-07-15 DIAGNOSIS — Z510 Encounter for antineoplastic radiation therapy: Secondary | ICD-10-CM | POA: Diagnosis not present

## 2016-07-18 ENCOUNTER — Ambulatory Visit: Payer: BLUE CROSS/BLUE SHIELD

## 2016-07-18 DIAGNOSIS — Z510 Encounter for antineoplastic radiation therapy: Secondary | ICD-10-CM | POA: Diagnosis not present

## 2016-07-19 ENCOUNTER — Encounter: Payer: Self-pay | Admitting: Radiation Oncology

## 2016-07-19 ENCOUNTER — Ambulatory Visit
Admission: RE | Admit: 2016-07-19 | Discharge: 2016-07-19 | Disposition: A | Discharge status: Home / Self Care | Payer: BLUE CROSS/BLUE SHIELD | Source: Ambulatory Visit | Attending: Radiation Oncology | Admitting: Radiation Oncology

## 2016-07-19 ENCOUNTER — Ambulatory Visit (HOSPITAL_COMMUNITY)
Admission: RE | Admit: 2016-07-19 | Discharge: 2016-07-19 | Disposition: A | Discharge status: Home / Self Care | Payer: BLUE CROSS/BLUE SHIELD | Source: Ambulatory Visit | Attending: Radiation Oncology | Admitting: Radiation Oncology

## 2016-07-19 VITALS — BP 109/69 | HR 84 | Temp 98.2°F | Resp 18 | Wt 207.8 lb

## 2016-07-19 DIAGNOSIS — M7989 Other specified soft tissue disorders: Secondary | ICD-10-CM | POA: Insufficient documentation

## 2016-07-19 DIAGNOSIS — C50411 Malignant neoplasm of upper-outer quadrant of right female breast: Secondary | ICD-10-CM

## 2016-07-19 DIAGNOSIS — Z171 Estrogen receptor negative status [ER-]: Secondary | ICD-10-CM

## 2016-07-19 DIAGNOSIS — Z510 Encounter for antineoplastic radiation therapy: Secondary | ICD-10-CM | POA: Diagnosis not present

## 2016-07-19 NOTE — Progress Notes (Signed)
*  PRELIMINARY RESULTS* Vascular Ultrasound Right upper extremity venous duplex has been completed.  Preliminary findings: the right upper extremity is negative for deep and superficial vein thrombosis in the visualized veins.  Preliminary results called to nurse, Santiago Glad.  Everrett Coombe 07/19/2016, 11:25 AM

## 2016-07-19 NOTE — Progress Notes (Signed)
  Radiation Oncology         [phone] ________________________________  Name: Lindsay [name] MRN: [id number]  Date: 07/19/2016  DOB: 1965/11/04  Weekly Radiation Therapy Management    ICD-9-CM ICD-10-CM   1. Malignant neoplasm of upper-outer quadrant of right breast in female, estrogen receptor negative (HCC) 174.4 C50.411 UE VENOUS DUPLEX   V86.1 Z17.1      Current Dose: 37.8 Gy     Planned Dose:  60.4 Gy  Narrative . . . . . . . . The patient presents for routine under treatment assessment.                                    Lindsay [name] has completed 21 fractions to her right breast. She denies any pain. The patient reports extreme fatigue this past weekend, which she felt could be related to allergies or a cold. She reports a mild, nonproductive cough. The patient reports a good appetite.                                  Set-up films were reviewed.                                 The chart was checked. Physical Findings. . .  weight is 207 lb 12.8 oz (94.3 kg). Her oral temperature is 98.2 F (36.8 C). Her blood pressure is 109/69 and her pulse is 84. Her respiration is 18 and oxygen saturation is 100%. . Weight essentially stable. Lungs are clear to auscultation bilaterally. Heart has regular rate and rhythm. Hyperpigmentation changes in the breast with no skin breakdown. Impression . . . . . . . The patient is tolerating radiation. Plan . . . . . . . . . . . . Continue treatment as planned.  Patient will undergo Doppler of right arm to rule out DVT.  ________________________________   Blair Promise, PhD, MD  This document serves as a record of services personally performed by Gery Pray, MD. It was created on his behalf by Maryla Morrow, a trained medical scribe. The creation of this record is based on the scribe's personal observations and the provider's statements to them. This document has been checked and approved by the attending provider.

## 2016-07-19 NOTE — Progress Notes (Signed)
Lindsay [name] completed 21st fraction to right breast today.  Patient denies any pain.  She reports having extreme fatigue this past weekend.  She felt like it could have been related to allergies or a cold.  She is experiencing a mild cough that is not productive.  Questioning what she can take OTC to help with that.  Skin to right breast is hyperpigmented and clean and intact.  She states that her appetite is well.  Vitals:   07/19/16 0907  BP: 109/69  Pulse: 84  Resp: 18  Temp: 98.2 F (36.8 C)  TempSrc: Oral  SpO2: 100%  Weight: 207 lb 12.8 oz (94.3 kg)    Wt Readings from Last 3 Encounters:  07/19/16 207 lb 12.8 oz (94.3 kg)  07/12/16 207 lb 3.2 oz (94 kg)  07/05/16 212 lb (96.2 kg)

## 2016-07-20 ENCOUNTER — Ambulatory Visit
Admission: RE | Admit: 2016-07-20 | Discharge: 2016-07-20 | Disposition: A | Discharge status: Home / Self Care | Payer: BLUE CROSS/BLUE SHIELD | Source: Ambulatory Visit | Attending: Radiation Oncology | Admitting: Radiation Oncology

## 2016-07-20 DIAGNOSIS — Z510 Encounter for antineoplastic radiation therapy: Secondary | ICD-10-CM | POA: Diagnosis not present

## 2016-07-21 ENCOUNTER — Ambulatory Visit
Admission: RE | Admit: 2016-07-21 | Discharge: 2016-07-21 | Disposition: A | Discharge status: Home / Self Care | Payer: BLUE CROSS/BLUE SHIELD | Source: Ambulatory Visit | Attending: Radiation Oncology | Admitting: Radiation Oncology

## 2016-07-21 ENCOUNTER — Telehealth: Payer: Self-pay | Admitting: Oncology

## 2016-07-21 DIAGNOSIS — Z510 Encounter for antineoplastic radiation therapy: Secondary | ICD-10-CM | POA: Diagnosis not present

## 2016-07-21 NOTE — Telephone Encounter (Signed)
[employer] Disability paperwork to fax # [phone]

## 2016-07-22 ENCOUNTER — Ambulatory Visit
Admission: RE | Admit: 2016-07-22 | Discharge: 2016-07-22 | Disposition: A | Discharge status: Home / Self Care | Payer: BLUE CROSS/BLUE SHIELD | Source: Ambulatory Visit | Attending: Radiation Oncology | Admitting: Radiation Oncology

## 2016-07-22 DIAGNOSIS — Z510 Encounter for antineoplastic radiation therapy: Secondary | ICD-10-CM | POA: Diagnosis not present

## 2016-07-25 ENCOUNTER — Ambulatory Visit
Admission: RE | Admit: 2016-07-25 | Discharge: 2016-07-25 | Disposition: A | Discharge status: Home / Self Care | Payer: BLUE CROSS/BLUE SHIELD | Source: Ambulatory Visit | Attending: Radiation Oncology | Admitting: Radiation Oncology

## 2016-07-25 DIAGNOSIS — Z510 Encounter for antineoplastic radiation therapy: Secondary | ICD-10-CM | POA: Diagnosis not present

## 2016-07-26 ENCOUNTER — Ambulatory Visit (HOSPITAL_BASED_OUTPATIENT_CLINIC_OR_DEPARTMENT_OTHER): Payer: BLUE CROSS/BLUE SHIELD | Admitting: Oncology

## 2016-07-26 ENCOUNTER — Ambulatory Visit
Admission: RE | Admit: 2016-07-26 | Discharge: 2016-07-26 | Disposition: A | Discharge status: Home / Self Care | Payer: BLUE CROSS/BLUE SHIELD | Source: Ambulatory Visit | Attending: Radiation Oncology | Admitting: Radiation Oncology

## 2016-07-26 ENCOUNTER — Other Ambulatory Visit (HOSPITAL_BASED_OUTPATIENT_CLINIC_OR_DEPARTMENT_OTHER): Payer: BLUE CROSS/BLUE SHIELD

## 2016-07-26 VITALS — BP 129/73 | HR 96 | Temp 98.5°F | Resp 18 | Ht 66.0 in | Wt 204.2 lb

## 2016-07-26 VITALS — BP 125/82 | HR 104 | Temp 98.7°F | Resp 18 | Wt 205.4 lb

## 2016-07-26 DIAGNOSIS — Z171 Estrogen receptor negative status [ER-]: Secondary | ICD-10-CM

## 2016-07-26 DIAGNOSIS — R5383 Other fatigue: Secondary | ICD-10-CM | POA: Diagnosis not present

## 2016-07-26 DIAGNOSIS — C50411 Malignant neoplasm of upper-outer quadrant of right female breast: Secondary | ICD-10-CM | POA: Diagnosis not present

## 2016-07-26 DIAGNOSIS — Z510 Encounter for antineoplastic radiation therapy: Secondary | ICD-10-CM | POA: Diagnosis not present

## 2016-07-26 LAB — CBC WITH DIFFERENTIAL/PLATELET
BASO%: 0.8 % (ref 0.0–2.0)
BASOS ABS: 0 10*3/uL (ref 0.0–0.1)
EOS%: 0.6 % (ref 0.0–7.0)
Eosinophils Absolute: 0 10*3/uL (ref 0.0–0.5)
HEMATOCRIT: 37.8 % (ref 34.8–46.6)
HGB: 12.7 g/dL (ref 11.6–15.9)
LYMPH#: 0.8 10*3/uL — AB (ref 0.9–3.3)
LYMPH%: 13.9 % — ABNORMAL LOW (ref 14.0–49.7)
MCH: 28.1 pg (ref 25.1–34.0)
MCHC: 33.5 g/dL (ref 31.5–36.0)
MCV: 84 fL (ref 79.5–101.0)
MONO#: 0.7 10*3/uL (ref 0.1–0.9)
MONO%: 12.7 % (ref 0.0–14.0)
NEUT#: 3.9 10*3/uL (ref 1.5–6.5)
NEUT%: 72 % (ref 38.4–76.8)
PLATELETS: 284 10*3/uL (ref 145–400)
RBC: 4.5 10*6/uL (ref 3.70–5.45)
RDW: 14.1 % (ref 11.2–14.5)
WBC: 5.5 10*3/uL (ref 3.9–10.3)

## 2016-07-26 LAB — COMPREHENSIVE METABOLIC PANEL
ALBUMIN: 4.1 g/dL (ref 3.5–5.0)
ALK PHOS: 46 U/L (ref 40–150)
ALT: 20 U/L (ref 0–55)
ANION GAP: 10 meq/L (ref 3–11)
AST: 21 U/L (ref 5–34)
BILIRUBIN TOTAL: 1.06 mg/dL (ref 0.20–1.20)
BUN: 11.2 mg/dL (ref 7.0–26.0)
CALCIUM: 10.6 mg/dL — AB (ref 8.4–10.4)
CO2: 30 mEq/L — ABNORMAL HIGH (ref 22–29)
Chloride: 100 mEq/L (ref 98–109)
Creatinine: 0.8 mg/dL (ref 0.6–1.1)
Glucose: 94 mg/dl (ref 70–140)
Potassium: 4.2 mEq/L (ref 3.5–5.1)
Sodium: 141 mEq/L (ref 136–145)
TOTAL PROTEIN: 8.2 g/dL (ref 6.4–8.3)

## 2016-07-26 NOTE — Progress Notes (Signed)
Lindsay [name] completed 26th fraction to right breast.  She denies any pain.  Reports that she had a cold last week and has some what resolved except for an occasional cough.  Yesterday she noticed extreme fatigue that has not gotten any better.  She states she is unable to perform ADL's in past two days because of extreme fatigue.  She states she hasn't had much of appetite since this fatigue has come on.  Skin to right breast is intact, clean and dry.  She has soreness when touched.  Hyperpigmentation noticed.    Vitals:   07/26/16 1119  BP: 125/82  Pulse: (!) 104  Resp: 18  Temp: 98.7 F (37.1 C)  TempSrc: Oral  SpO2: 100%  Weight: 205 lb 6.4 oz (93.2 kg)    Wt Readings from Last 3 Encounters:  07/26/16 205 lb 6.4 oz (93.2 kg)  07/19/16 207 lb 12.8 oz (94.3 kg)  07/12/16 207 lb 3.2 oz (94 kg)

## 2016-07-26 NOTE — Progress Notes (Signed)
[facility]  [phone] [phone]     ID: Lindsay [name] DOB: 1966/02/13  MR#: [id number]  MHD#:[id number]  Patient Care Team: Bernerd Limbo, MD as PCP - General (Family Medicine) Rolm Bookbinder, MD as Consulting Physician (General Surgery) Chauncey Cruel, MD as Consulting Physician (Oncology) Gery Pray, MD as Consulting Physician (Radiation Oncology) Newton Pigg, MD as Consulting Physician (Obstetrics and Gynecology) Dorna Leitz, MD as Consulting Physician (Orthopedic Surgery) OTHER MD:  CHIEF COMPLAINT: Invasive breast cancer  CURRENT TREATMENT:    BREAST CANCER HISTORY: From the original intake note:  Alaia had routine screening mammography with tomography at [facility] 12/04/2015. There was a new mass measuring 1.5 cm at the 11:00 position of the right breast. Right breast ultrasonography 12/10/2015 confirmed a 1.9 cm irregular mass in the upper-outer quadrant of the right breast posteriorly. There was a right axillary lymph node with focal cortical thickening.  On 12/14/2015 Angela underwent biopsy of the right breast mass at 9:00 position as well as a suspicious lymph node. A separate area in the right breast 10 cm from the nipple was also biopsied biopsied. The final pathology ([id number]) showed the additional area and the lymph node to be benign. The 9:00 mass however was positive for invasive ductal carcinoma, grade 3, estrogen receptor and progesterone receptor negative, with an MIB-1 of 70%, and HER-2 nonamplified with a signals ratio 1.44, and the number per cell 2.70.  Her subsequent history is as detailed below  INTERVAL HISTORY: Shauntelle returns today for follow-up of her triple negative breast cancer. Since her last visit here, she underwent right lumpectomy and sentinel lymph node sampling, on 05/05/2016. The final pathology ([id number]) showed residual invasive ductal carcinoma measuring 2.7 cm. This was grade 3. Margins were  negative. Both sentinel lymph nodes and a third incidentally removed lymph node were clear.  The prognostic panel was repeated and the tumor was again estrogen and progesterone receptor negative, HER-2 nonamplified, with a signals ratio of 1.13 and the number per cell 2.15  She then proceeded to radiation, which is ongoing.   REVIEW OF SYSTEMS: Kyannah is tolerating radiation moderately well, except for fatigue. She has not had any desquamation. However she is very tired, is taking naps, and she does not feel there is any way she could possibly go back to work before early May. On the plus side her hair is coming back in and she likes it. Family is doing well and she says they're very supportive. A detailed review of systems today was otherwise stable  PAST MEDICAL HISTORY: Past Medical History:  Diagnosis Date  . Allergy    seasonal  . Arthritis   . Breast cancer of upper-outer quadrant of right female breast ([facility]) 12/16/2015  . Depression    mild depression after death of daughter- no meds  . Hot flashes   . Hx of adenomatous colonic polyps 04/25/2014  . Hypertension     PAST SURGICAL HISTORY: Past Surgical History:  Procedure Laterality Date  . ABDOMINAL HYSTERECTOMY    . BREAST LUMPECTOMY WITH RADIOACTIVE SEED AND SENTINEL LYMPH NODE BIOPSY Right 05/05/2016   Procedure: BREAST LUMPECTOMY WITH RADIOACTIVE SEED AND SENTINEL LYMPH NODE BIOPSY;  Surgeon: Rolm Bookbinder, MD;  Location: [facility];  Service: General;  Laterality: Right;  . CHOLECYSTECTOMY N/A 09/09/2015   Procedure: LAPAROSCOPIC CHOLECYSTECTOMY;  Surgeon: Ralene Ok, MD;  Location: [facility];  Service: General;  Laterality: N/A;  . COLONOSCOPY    . KNEE ARTHROSCOPY  2016  . PERIPHERAL VASCULAR CATHETERIZATION Right 05/05/2016   Procedure: PORTA CATH REMOVAL;  Surgeon: Rolm Bookbinder, MD;  Location: [facility];  Service: General;  Laterality: Right;  . PORTACATH PLACEMENT Right  12/29/2015   Procedure: INSERTION PORT-A-CATH WITH ULTRASOUND GUIDANCE;  Surgeon: Rolm Bookbinder, MD;  Location: [facility];  Service: General;  Laterality: Right;    FAMILY HISTORY Family History  Problem Relation Age of Onset  . Hypertension Mother   . Breast cancer Maternal Grandfather   . Breast cancer Other   . Breast cancer Cousin   . Sudden death Neg Hx   . Diabetes Neg Hx   . Heart attack Neg Hx   . Hyperlipidemia Neg Hx   . Colon cancer Neg Hx   . Esophageal cancer Neg Hx   . Rectal cancer Neg Hx   . Stomach cancer Neg Hx   The patient's father was murdered at age 47. The patient's mother is 22 years old as of August 2017. The patient has one brother, 2 sisters. A cousin was diagnosed with left breast cancer at age 25. A maternal aunt and a maternal grandmother were also diagnosed with breast cancer at age 51 and 82 respectively.  GYNECOLOGIC HISTORY:  No LMP recorded. Patient has had a hysterectomy.  Menarche age 53, first live birth age 60, the patient is GX P3. She is status post hysterectomy without salpingo-oophorectomy. She did not take hormone replacement. She used oral contraceptives remotely without complications.  SOCIAL HISTORY:  Ayza works as an Engineer, production at [employer]. Her husband [name] [place] is a news and record Lexicographer. Son [name] lives in [place] and is a Games developer. Son [name] lives in [place] and is a Architectural technologist. The patient had a daughter who died at the age of 53.    ADVANCED DIRECTIVES: Not in place   HEALTH MAINTENANCE: Social History  Substance Use Topics  . Smoking status: Never Smoker  . Smokeless tobacco: Never Used  . Alcohol use No     Colonoscopy: 2016  PAP:  Bone density: Never   Allergies  Allergen Reactions  . Percocet [Oxycodone-Acetaminophen] Diarrhea and Nausea And Vomiting  . Tramadol Nausea And Vomiting  . Vicodin [Hydrocodone-Acetaminophen] Nausea And Vomiting  . Gabapentin Other (See  Comments)    "light headed, dizziness, didn't like the way it made me feel"  . Pollen Extract Other (See Comments)    Runny nose, itchy eyes and sneezing due to seasonal allergies.    Current Outpatient Prescriptions  Medication Sig Dispense Refill  . EPINEPHrine (EPIPEN 2-PAK) 0.3 mg/0.3 mL IJ SOAJ injection Inject 0.3 mg into the muscle once as needed (anaphylaxis allergic reaction).     . fexofenadine (ALLEGRA) 180 MG tablet Take 180 mg by mouth daily.    . hyaluronate sodium (RADIAPLEXRX) GEL Apply 1 application topically once.    Marland Kitchen lisinopril-hydrochlorothiazide (PRINZIDE,ZESTORETIC) 20-12.5 MG per tablet Take 1 tablet by mouth daily.    . meloxicam (MOBIC) 15 MG tablet Take 1 tablet by mouth as needed.  0  . non-metallic deodorant (ALRA) MISC Apply 1 application topically daily as needed.    . Triamcinolone Acetonide (NASACORT ALLERGY 24HR NA) Place 1-2 sprays into the nose daily as needed (allergies).     No current facility-administered medications for this visit.     OBJECTIVE: Middle-aged African-American woman  Vitals:   07/26/16 1323  BP: 129/73  Pulse: 96  Resp: 18  Temp: 98.5 F (36.9 C)  Body mass index is 32.96 kg/m.    ECOG FS:1 - Symptomatic but completely ambulatory  Sclerae unicteric, EOMs intact Oropharynx clear and moist No cervical or supraclavicular adenopathy Lungs no rales or rhonchi Heart regular rate and rhythm Abd soft, nontender, positive bowel sounds MSK no focal spinal tenderness, no upper extremity lymphedema Neuro: nonfocal, well oriented, appropriate affect Breasts: The right breast has undergone lumpectomy and is currently undergoing radiation. There is hyperpigmentation but no desquamation. The cosmetic result is excellent. The left breast is unremarkable. Both axillae are benign.   LAB RESULTS:  CMP     Component Value Date/Time   NA 141 07/26/2016 1242   K 4.2 07/26/2016 1242   CL 100 (L) 04/09/2016 1310   CO2 30 (H)  07/26/2016 1242   GLUCOSE 94 07/26/2016 1242   BUN 11.2 07/26/2016 1242   CREATININE 0.8 07/26/2016 1242   CALCIUM 10.6 (H) 07/26/2016 1242   PROT 8.2 07/26/2016 1242   ALBUMIN 4.1 07/26/2016 1242   AST 21 07/26/2016 1242   ALT 20 07/26/2016 1242   ALKPHOS 46 07/26/2016 1242   BILITOT 1.06 07/26/2016 1242   GFRNONAA >60 04/09/2016 1310   GFRAA >60 04/09/2016 1310    INo results found for: SPEP, UPEP  Lab Results  Component Value Date   WBC 5.5 07/26/2016   NEUTROABS 3.9 07/26/2016   HGB 12.7 07/26/2016   HCT 37.8 07/26/2016   MCV 84.0 07/26/2016   PLT 284 07/26/2016      Chemistry      Component Value Date/Time   NA 141 07/26/2016 1242   K 4.2 07/26/2016 1242   CL 100 (L) 04/09/2016 1310   CO2 30 (H) 07/26/2016 1242   BUN 11.2 07/26/2016 1242   CREATININE 0.8 07/26/2016 1242      Component Value Date/Time   CALCIUM 10.6 (H) 07/26/2016 1242   ALKPHOS 46 07/26/2016 1242   AST 21 07/26/2016 1242   ALT 20 07/26/2016 1242   BILITOT 1.06 07/26/2016 1242       No results found for: LABCA2  No components found for: LABCA125  No results for input(s): INR in the last 168 hours.  Urinalysis    Component Value Date/Time   COLORURINE YELLOW 04/09/2016 1455   APPEARANCEUR CLOUDY (A) 04/09/2016 1455   LABSPEC 1.011 04/09/2016 1455   PHURINE 7.0 04/09/2016 1455   GLUCOSEU NEGATIVE 04/09/2016 1455   HGBUR NEGATIVE 04/09/2016 1455   BILIRUBINUR NEGATIVE 04/09/2016 1455   KETONESUR NEGATIVE 04/09/2016 1455   PROTEINUR NEGATIVE 04/09/2016 1455   UROBILINOGEN 1.0 03/03/2015 1240   NITRITE NEGATIVE 04/09/2016 1455   LEUKOCYTESUR NEGATIVE 04/09/2016 1455     STUDIES: Xr Knee 3 View Right  Result Date: 06/27/2016 Moderate DJD   ELIGIBLE FOR AVAILABLE RESEARCH PROTOCOL: considered the PREVENT trial: decided against it  ASSESSMENT: 51 y.o. [place] woman status post right breast upper outer quadrant biopsy 12/14/2015 for a clinical T1c N0, stage IA  invasive  ductal carcinoma, grade 3, triple negative, with an MIB-1 of 70%  (a) this stages as IIB In the 2018 new Prognostic classification  (1) genetics testing negative for mutations within any of 20 genes on the Breast/Ovarian Cancer Panel through [employer].  Additionally, no VUS were found.   (2) neoadjuvant chemotherapy consisting of doxorubicin and cyclophosphamide in dose this fashion 4, completed 02/22/2016, followed by paclitaxel weekly 5 (of 12 treatments planned), starting 03/08/2016, stopped 04/05/2016 after cycle 5 because of neuropathy  (a) cycle 4 of cyclophosphamide and doxorubicin  held 1 week because of side effects  (3) status post right lumpectomy and sentinel lymph node sampling 05/05/2016 for a residual ypT2 ypN) invasive ductal carcinoma, grade 3, the repeat prognostic panel again triple negative the residual cancer burden was 2  (4) adjuvant radiation in process  (5) left thyroid nodule biopsy 04/14/2016 read as [facility]   PLAN: Kamika is completing adjuvant radiation without major complications other than fatigue. This is significant. I do not expect her to recover immediately, but she may be able to return to work as early as May. She will let us know if she would like to have [place] complete her FM [place] papers again so she can take time to recover  She will complete her radiation in another week and a half. That means she will have completed all her treatment and therefore I plan to see her again in August, to initiate long-term follow-up. I will obtain a repeat mammogram before that visit to serve as the new baseline  She knows to call for any other problems that may develop before her return visit here.    :Chauncey Cruel, MD   07/26/2016 1:26 PM Medical Oncology and Hematology [facility] [place], [place] [address] Tel. [phone]    Fax. [phone] you

## 2016-07-26 NOTE — Progress Notes (Signed)
  Radiation Oncology         [phone] ________________________________  Name: Lindsay [name] MRN: [id number]  Date: 07/26/2016  DOB: 12-20-1965   Weekly Radiation Therapy Management    ICD-9-CM ICD-10-CM   1. Malignant neoplasm of upper-outer quadrant of right breast in female, estrogen receptor negative (HCC) 174.4 C50.411    V86.1 Z17.1      Current Dose: 46.8 Gy     Planned Dose:  60.4 Gy  Narrative . . . . . . . . The patient presents for routine under treatment assessment.                                    Kourtney Terriquez has completed 26 fractions to her right breast. Pt denies pain but does report having a cold last week. She states this has somewhat resolved aside from occasional cough. Pt states she experienced extreme fatigue yesterday that has not improved. She states she has not been able to complete ADL's in the past few days due to this. Pt also endorses loss of appetite since onset of fatigue.                                   Set-up films were reviewed.                                 The chart was checked. Physical Findings. . .  weight is 205 lb 6.4 oz (93.2 kg). Her oral temperature is 98.7 F (37.1 C). Her blood pressure is 125/82 and her pulse is 104 (abnormal). Her respiration is 18 and oxygen saturation is 100%.  Weight essentially stable. Lungs are clear to auscultation bilaterally. Heart has regular rate and rhythm. Diffuse hyperpigmentation changes in the right breast with no skin breakdown. Impression . . . . . . . The patient is tolerating radiation. Plan . . . . . . . . . . . . Continue treatment as planned.   ________________________________   Blair Promise, PhD, MD  This document serves as a record of services personally performed by Gery Pray, MD. It was created on his behalf by Maryla Morrow, a trained medical scribe. The creation of this record is based on the scribe's personal observations and the provider's statements to them. This document  has been checked and approved by the attending provider.

## 2016-07-27 ENCOUNTER — Inpatient Hospital Stay
Admission: RE | Admit: 2016-07-27 | Discharge: 2016-07-27 | Disposition: A | Discharge status: Home / Self Care | Payer: Self-pay | Source: Ambulatory Visit | Attending: Radiation Oncology | Admitting: Radiation Oncology

## 2016-07-27 ENCOUNTER — Ambulatory Visit
Admission: RE | Admit: 2016-07-27 | Discharge: 2016-07-27 | Disposition: A | Discharge status: Home / Self Care | Payer: BLUE CROSS/BLUE SHIELD | Source: Ambulatory Visit | Attending: Radiation Oncology | Admitting: Radiation Oncology

## 2016-07-27 ENCOUNTER — Encounter: Payer: Self-pay | Admitting: Radiation Oncology

## 2016-07-27 DIAGNOSIS — Z510 Encounter for antineoplastic radiation therapy: Secondary | ICD-10-CM | POA: Diagnosis not present

## 2016-07-27 NOTE — Progress Notes (Signed)
Discussed patient's condition with Dr. Sondra Come.  Advised patient to drink a lot of liquids today and to call if she is feeling worse.  Also advised her that Dr. Sondra Come can see her tomorrow morning and we can schedule IV fluids if she is not feeling better. Lindsay [name] verbalized understanding and agreement.

## 2016-07-27 NOTE — Progress Notes (Signed)
Lindsay [name] has completed 27 fractions to her right breast.  She reports feeling fatigued for the past 2 days.  She also mentioned that she has felt like she has a cold with nasal drainage, congestion and a cough.  She reports having nausea and a poor appetite for the past 2 days.  She reports having tenderness in her left breast.  She has hyperpigmentation on her left breast and is using radiaplex as directed. Orthostatic vitals taken: bp sitting 101/47, hr 102, bp standing 100/68, hr 121.  BP 100/68 (BP Location: Left Arm, Patient Position: Standing)   Pulse (!) 121 Comment: standing  Temp 99.1 F (37.3 C) (Oral)   Ht 5\' 6"  (1.676 m)   Wt 206 lb 6.4 oz (93.6 kg)   SpO2 100%   BMI 33.31 kg/m    Wt Readings from Last 3 Encounters:  07/27/16 206 lb 6.4 oz (93.6 kg)  07/26/16 205 lb 6.4 oz (93.2 kg)  07/26/16 204 lb 3.2 oz (92.6 kg)

## 2016-07-28 ENCOUNTER — Ambulatory Visit: Payer: BLUE CROSS/BLUE SHIELD

## 2016-07-28 ENCOUNTER — Ambulatory Visit
Admission: RE | Admit: 2016-07-28 | Discharge: 2016-07-28 | Disposition: A | Discharge status: Home / Self Care | Payer: BLUE CROSS/BLUE SHIELD | Source: Ambulatory Visit | Attending: Radiation Oncology | Admitting: Radiation Oncology

## 2016-07-28 DIAGNOSIS — Z510 Encounter for antineoplastic radiation therapy: Secondary | ICD-10-CM | POA: Diagnosis not present

## 2016-07-29 ENCOUNTER — Ambulatory Visit: Payer: BLUE CROSS/BLUE SHIELD

## 2016-07-29 ENCOUNTER — Ambulatory Visit
Admission: RE | Admit: 2016-07-29 | Discharge: 2016-07-29 | Disposition: A | Discharge status: Home / Self Care | Payer: BLUE CROSS/BLUE SHIELD | Source: Ambulatory Visit | Attending: Radiation Oncology | Admitting: Radiation Oncology

## 2016-07-29 DIAGNOSIS — Z510 Encounter for antineoplastic radiation therapy: Secondary | ICD-10-CM | POA: Diagnosis not present

## 2016-08-01 ENCOUNTER — Ambulatory Visit
Admission: RE | Admit: 2016-08-01 | Discharge: 2016-08-01 | Disposition: A | Discharge status: Home / Self Care | Payer: BLUE CROSS/BLUE SHIELD | Source: Ambulatory Visit | Attending: Radiation Oncology | Admitting: Radiation Oncology

## 2016-08-01 ENCOUNTER — Encounter: Payer: Self-pay | Admitting: Radiation Oncology

## 2016-08-01 DIAGNOSIS — Z510 Encounter for antineoplastic radiation therapy: Secondary | ICD-10-CM | POA: Diagnosis not present

## 2016-08-01 NOTE — Progress Notes (Signed)
Paperwork (one main solution) faxed to [phone], conf rec, copy given to treatment machine for the patient

## 2016-08-02 ENCOUNTER — Encounter: Payer: Self-pay | Admitting: Radiation Oncology

## 2016-08-02 ENCOUNTER — Ambulatory Visit
Admission: RE | Admit: 2016-08-02 | Discharge: 2016-08-02 | Disposition: A | Discharge status: Home / Self Care | Payer: BLUE CROSS/BLUE SHIELD | Source: Ambulatory Visit | Attending: Radiation Oncology | Admitting: Radiation Oncology

## 2016-08-02 VITALS — BP 111/73 | HR 72 | Temp 98.4°F | Ht 66.0 in | Wt 206.2 lb

## 2016-08-02 DIAGNOSIS — Z171 Estrogen receptor negative status [ER-]: Secondary | ICD-10-CM | POA: Insufficient documentation

## 2016-08-02 DIAGNOSIS — C50411 Malignant neoplasm of upper-outer quadrant of right female breast: Secondary | ICD-10-CM

## 2016-08-02 DIAGNOSIS — Z510 Encounter for antineoplastic radiation therapy: Secondary | ICD-10-CM | POA: Diagnosis not present

## 2016-08-02 NOTE — Progress Notes (Signed)
Lindsay [name] has completed 31 fractions to her right breast.  She reports having burning pain under her right breast and is rating it an 8/10.  She reports she is feeling better this week and her fatigue has improved.  She is using radiaplex bid.  The skin on her right breast has hyperpigmentation with small, dry peeling areas under her arm.  She has been given hydrogel pads to try.  BP 111/73 (BP Location: Left Arm, Patient Position: Sitting)   Pulse 72   Temp 98.4 F (36.9 C) (Oral)   Ht 5\' 6"  (1.676 m)   Wt 206 lb 3.2 oz (93.5 kg)   SpO2 100%   BMI 33.28 kg/m    Wt Readings from Last 3 Encounters:  08/02/16 206 lb 3.2 oz (93.5 kg)  07/27/16 206 lb 6.4 oz (93.6 kg)  07/26/16 205 lb 6.4 oz (93.2 kg)

## 2016-08-02 NOTE — Progress Notes (Signed)
  Radiation Oncology         [phone] ________________________________  Name: Lindsay [name] MRN: [id number]  Date: 08/02/2016  DOB: [birth year]   Weekly Radiation Therapy Management    ICD-9-CM ICD-10-CM   1. Malignant neoplasm of upper-outer quadrant of right breast in female, estrogen receptor negative (HCC) 174.4 C50.411    V86.1 Z17.1      Current Dose: 56.4 Gy     Planned Dose:  60.4 Gy  Narrative . . . . . . . . The patient presents for routine under treatment assessment.                                    Lindsay [name] has completed 31 fractions to her right breast.  She reports having burning pain under her right breast and is rating it as an 8/10. She is not taking anything for the pain.  She reports she is feeling better this week and her fatigue has improved.  She is using radiaplex bid.  The nurse notes skin on her right breast has hyperpigmentation with small, dry, peeling areas under her arm.                                   Set-up films were reviewed.                                 The chart was checked. Physical Findings. . .  height is 5\' 6"  (1.676 m) and weight is 206 lb 3.2 oz (93.5 kg). Her oral temperature is 98.4 F (36.9 C). Her blood pressure is 111/73 and her pulse is 72. Her oxygen saturation is 100%.  Weight essentially stable. Lungs are clear to auscultation bilaterally. Heart has regular rate and rhythm. Diffuse intence hyperpigmentation changes throughout the right breast with no skin breakdown. Impression . . . . . . . The patient is tolerating radiation, albeit with right breast pain. Plan . . . . . . . . . . . . Continue treatment as planned. The patient was provided with hydrogel pads for her pain. We discussed that she will likely develop moist desquamation and we discussed management issues concerning this development. The patient is almost done with treatment and her pain should improve afterwards. ________________________________   Blair Promise,  PhD, MD  This document serves as a record of services personally performed by Gery Pray, MD. It was created on his behalf by Maryla Morrow, a trained medical scribe. The creation of this record is based on the scribe's personal observations and the provider's statements to them. This document has been checked and approved by the attending provider.

## 2016-08-03 ENCOUNTER — Ambulatory Visit: Payer: BLUE CROSS/BLUE SHIELD

## 2016-08-03 ENCOUNTER — Ambulatory Visit
Admission: RE | Admit: 2016-08-03 | Discharge: 2016-08-03 | Disposition: A | Discharge status: Home / Self Care | Payer: BLUE CROSS/BLUE SHIELD | Source: Ambulatory Visit | Attending: Radiation Oncology | Admitting: Radiation Oncology

## 2016-08-03 DIAGNOSIS — Z510 Encounter for antineoplastic radiation therapy: Secondary | ICD-10-CM | POA: Diagnosis not present

## 2016-08-04 ENCOUNTER — Telehealth: Payer: Self-pay | Admitting: *Deleted

## 2016-08-04 ENCOUNTER — Ambulatory Visit
Admission: RE | Admit: 2016-08-04 | Discharge: 2016-08-04 | Disposition: A | Discharge status: Home / Self Care | Payer: BLUE CROSS/BLUE SHIELD | Source: Ambulatory Visit | Attending: Radiation Oncology | Admitting: Radiation Oncology

## 2016-08-04 DIAGNOSIS — Z510 Encounter for antineoplastic radiation therapy: Secondary | ICD-10-CM | POA: Diagnosis not present

## 2016-08-04 NOTE — Telephone Encounter (Signed)
  Oncology Nurse Navigator Documentation  Navigator Location: [facility] (08/04/16 1100)   )Navigator Encounter Type: Telephone (08/04/16 1100) Telephone: [place] Call (08/04/16 1100)                   Patient Visit Type: HGDJME (08/04/16 1100) Treatment Phase: Final Radiation Tx (08/04/16 1100)                            Time Spent with Patient: 15 (08/04/16 1100)

## 2016-08-08 ENCOUNTER — Encounter: Payer: Self-pay | Admitting: Radiation Oncology

## 2016-08-08 NOTE — Progress Notes (Signed)
  Radiation Oncology         [phone] ________________________________  Name: Lindsay [name] MRN: [id number]  Date: 08/08/2016  DOB: 06/25/1965  End of Treatment Note  Diagnosis:  Stage ypT2, ypN0, Mx grade 2-3 invasive ductal carcinoma of the right breast (triple negative)     Indication for treatment:  Curative, breast conservation therapy      Radiation treatment dates:  06/20/17-08/04/16  Site/dose: 1) Right breast/ 50.4 Gy in 28 fractions   2) Right breast boost / 10 Gy in 5 fractions  Beams/energy:  1) 3D / 10, 6X    2) Isodose plan/ 10X, 6X  Narrative: The patient tolerated radiation treatment relatively well. During treatment, the patient reported 8/10 burning pain under her right breast. She experienced diffuse hyperpigmentation changes without skin breakdown to the right breast.  Plan: The patient has completed radiation treatment. The patient will return to radiation oncology clinic for routine followup in one month. I advised them to call or return sooner if they have any questions or concerns related to their recovery or treatment.  -----------------------------------  Blair Promise, PhD, MD  This document serves as a record of services personally performed by Gery Pray, MD. It was created on his behalf by Bethann Humble, a trained medical scribe. The creation of this record is based on the scribe's personal observations and the provider's statements to them. This document has been checked and approved by the attending provider.

## 2016-08-18 ENCOUNTER — Encounter: Payer: Self-pay | Admitting: Radiation Oncology

## 2016-08-18 NOTE — Progress Notes (Signed)
Paperwork (onemain solution) received 4/12, faxed to [phone], conf received. Copy mailed to patient 08/18/16

## 2016-08-19 NOTE — Telephone Encounter (Signed)
error 

## 2016-08-22 ENCOUNTER — Telehealth: Payer: Self-pay | Admitting: *Deleted

## 2016-08-22 NOTE — Telephone Encounter (Signed)
Patient called and left a voice message stating, "I need a letter/note written to go back to work the first week of May. My return number is [phone]. Call me when it's ready and I'll come and pick it up.

## 2016-08-26 ENCOUNTER — Encounter: Payer: Self-pay | Admitting: *Deleted

## 2016-08-31 ENCOUNTER — Telehealth: Payer: Self-pay | Admitting: Oncology

## 2016-08-31 NOTE — Telephone Encounter (Signed)
Faxed completed [employer] disability forms to fax#[phone] on 08/22/16 and scanned copy to epic under media tab

## 2016-09-22 ENCOUNTER — Ambulatory Visit: Payer: Self-pay | Admitting: Radiation Oncology

## 2016-09-23 ENCOUNTER — Encounter: Payer: Self-pay | Admitting: Oncology

## 2016-09-26 ENCOUNTER — Encounter: Payer: Self-pay | Admitting: Radiation Oncology

## 2016-09-26 ENCOUNTER — Ambulatory Visit
Admission: RE | Admit: 2016-09-26 | Discharge: 2016-09-26 | Disposition: A | Discharge status: Home / Self Care | Payer: BLUE CROSS/BLUE SHIELD | Source: Ambulatory Visit | Attending: Radiation Oncology | Admitting: Radiation Oncology

## 2016-09-26 DIAGNOSIS — Z171 Estrogen receptor negative status [ER-]: Secondary | ICD-10-CM | POA: Diagnosis not present

## 2016-09-26 DIAGNOSIS — C50411 Malignant neoplasm of upper-outer quadrant of right female breast: Secondary | ICD-10-CM | POA: Insufficient documentation

## 2016-09-26 DIAGNOSIS — Y842 Radiological procedure and radiotherapy as the cause of abnormal reaction of the patient, or of later complication, without mention of misadventure at the time of the procedure: Secondary | ICD-10-CM | POA: Diagnosis not present

## 2016-09-26 NOTE — Progress Notes (Signed)
  Radiation Oncology         [phone] ________________________________  Name: Lindsay [name] MRN: [id number]  Date: 09/26/2016  DOB: 07-21-1965  Follow-Up Visit Note  CC: Bernerd Limbo, MD  Magrinat, Virgie Dad, MD    ICD-9-CM ICD-10-CM   1. Malignant neoplasm of upper-outer quadrant of right breast in female, estrogen receptor negative ([place]) 174.4 C50.411    V86.1 Z17.1     Diagnosis:  Stage ypT2, ypN0, Mx grade 2-3 invasive ductal carcinoma of the right breast (triple negative)   Interval Since Last Radiation:  2 months 06/20/16-08/04/16: 50.4 Gy to the right breast plus a 10 Gy boost  Narrative:  The patient returns today for routine follow-up. She reports occasional shooting pain in the right breast. She reports energy level is improving. She is working full time. She denies pain.                          ALLERGIES:  is allergic to percocet [oxycodone-acetaminophen]; tramadol; vicodin [hydrocodone-acetaminophen]; gabapentin; and pollen extract.  Meds: Current Outpatient Prescriptions  Medication Sig Dispense Refill  . EPINEPHrine (EPIPEN 2-PAK) 0.3 mg/0.3 mL IJ SOAJ injection Inject 0.3 mg into the muscle once as needed (anaphylaxis allergic reaction).     . fexofenadine (ALLEGRA) 180 MG tablet Take 180 mg by mouth daily.    Marland Kitchen lisinopril-hydrochlorothiazide (PRINZIDE,ZESTORETIC) 20-12.5 MG per tablet Take 1 tablet by mouth daily.    . meloxicam (MOBIC) 15 MG tablet Take 1 tablet by mouth as needed.  0  . Triamcinolone Acetonide (NASACORT ALLERGY 24HR NA) Place 1-2 sprays into the nose daily as needed (allergies).     No current facility-administered medications for this encounter.     Physical Findings: The patient is in no acute distress. Patient is alert and oriented.  height is 5\' 6"  (1.676 m) and weight is 208 lb 9.6 oz (94.6 kg). Her oral temperature is 98.3 F (36.8 C). Her blood pressure is 115/70 and her pulse is 82. Her oxygen saturation is 100%. .  No significant  changes. Lungs are clear to auscultation bilaterally. Heart has regular rate and rhythm. No palpable cervical, supraclavicular, or axillary adenopathy. Abdomen soft, non-tender, normal bowel sounds. Left breast no palpable masses, nipple discharge, or bleeding. Right breast the patient continues to have some hyperpigmentation changes and edema. No palpable mass or nipple discharge.   Lab Findings: Lab Results  Component Value Date   WBC 5.5 07/26/2016   HGB 12.7 07/26/2016   HCT 37.8 07/26/2016   MCV 84.0 07/26/2016   PLT 284 07/26/2016    Radiographic Findings: No results found.  Impression:  The patient is recovering from the effects of radiation. No evidence of recurrence on clinical exam.  Plan: The patient will follow up in radiation oncology on a prn basis. She will remain under close follow up with surgery and medical oncology.  ____________________________________    This document serves as a record of services personally performed by Gery Pray, MD. It was created on his behalf by Bethann Humble, a trained medical scribe. The creation of this record is based on the scribe's personal observations and the provider's statements to them. This document has been checked and approved by the attending provider.

## 2016-09-26 NOTE — Progress Notes (Signed)
Lindsay [name] is here for follow up after treatment to her right breast.  She denies having pain except for occasional shooting pains in her right breast.  She reports her energy level is improving and she is working full time.  The skin on her right breast has hyperpigmentation.  BP 115/70 (BP Location: Left Arm, Patient Position: Sitting)   Pulse 82   Temp 98.3 F (36.8 C) (Oral)   Ht 5\' 6"  (1.676 m)   Wt 208 lb 9.6 oz (94.6 kg)   SpO2 100%   BMI 33.67 kg/m    Wt Readings from Last 3 Encounters:  09/26/16 208 lb 9.6 oz (94.6 kg)  08/02/16 206 lb 3.2 oz (93.5 kg)  07/27/16 206 lb 6.4 oz (93.6 kg)

## 2016-09-28 ENCOUNTER — Encounter: Payer: BLUE CROSS/BLUE SHIELD | Admitting: Adult Health

## 2016-10-04 NOTE — Progress Notes (Signed)
CLINIC:  Survivorship   REASON FOR VISIT:  Routine follow-up post-treatment for a recent history of breast cancer.  BRIEF ONCOLOGIC HISTORY:    Malignant neoplasm of upper-outer quadrant of right breast in female, estrogen receptor negative ([place])   12/14/2015 Initial Biopsy    Right breast biopsy: IDC, grade 3, triple negative, Ki-67 70%.        12/16/2015 Initial Diagnosis    Malignant neoplasm of upper-outer quadrant of right breast in female, estrogen receptor negative ([place])     01/05/2016 - 04/05/2016 Neo-Adjuvant Chemotherapy    Doxorubicin and Cyclophosphamide dose dense x 4 cycles, followed by weekly Paclitaxel x 5 (discontinued early due to peripheral neuropathy)      02/01/2016 Genetic Testing    Genetic testing was normal, and did not reveal a deleterious mutation in these genes.  Additionally, no variants of uncertain significance (VUSes) were found. Genes tested include: ATM, BARD1, BRCA1, BRCA2, BRIP1, CDH1, CHEK2, FANCC, MLH1, MSH2, MSH6, NBN, PALB2, PMS2, PTEN, RAD51C, RAD51D, TP53, and XRCC2.  This panel also includes deletion/duplication analysis (without sequencing) for one gene, EPCAM.        05/05/2016 Surgery    Right lumpectomy Donne [name]): IDC, grade III, 2.7 cm, DCIS high grade, margins negative, 3 LN negative for disease.  ER neg, PR neg, Ki-67 70%, HER-2 negative (ratio 1.13)      06/20/2016 - 08/04/2016 Radiation Therapy    Adjuvant radiation ([place]): 1) Right breast/ 50.4 Gy in 28 fractions.  2) Right breast boost / 10 Gy in 5 fractions        INTERVAL HISTORY:  Lindsay [name] presents to the [facility] today for our initial meeting to review her survivorship care plan detailing her treatment course for breast cancer, as well as monitoring long-term side effects of that treatment, education regarding health maintenance, screening, and overall wellness and health promotion.     Overall, Lindsay [name] reports doing well.  She isn't having any issues other  than some hot flashes which started prior to her breast cancer diagnosis.  Her chemotherapy was discontinued early due to peripheral neuropathy.  She tells me today that the neuropathy has completely resolved.  She is not exercising.  She has a primary care provider who she sees every 6 months.  She does see a gynecologist, she did however undergo partial hysterectomy.      REVIEW OF SYSTEMS:  Review of Systems  Constitutional: Negative for appetite change, chills, diaphoresis, fatigue, fever and unexpected weight change.  HENT:   Negative for hearing loss and lump/mass.   Eyes: Negative for eye problems and icterus.  Respiratory: Negative for chest tightness, cough and shortness of breath.   Cardiovascular: Negative for chest pain, leg swelling and palpitations.  Gastrointestinal: Negative for abdominal distention and abdominal pain.  Endocrine: Negative for hot flashes.  Genitourinary: Negative for difficulty urinating, dyspareunia and menstrual problem.   Musculoskeletal: Negative for arthralgias and back pain.  Skin: Negative for itching and rash.  Neurological: Negative for dizziness, extremity weakness and numbness.  Hematological: Negative for adenopathy. Does not bruise/bleed easily.  Psychiatric/Behavioral: Negative for depression. The patient is not nervous/anxious.      Breast: Denies any new nodularity, masses, tenderness, nipple changes, or nipple discharge.       ONCOLOGY TREATMENT TEAM:  1. Surgeon:  Dr. Donne [name] at [facility] 2. Medical Oncologist: Dr. Jana Hakim 3. Radiation Oncologist: Dr. Sondra Come    PAST MEDICAL/SURGICAL HISTORY:  Past Medical History:  Diagnosis Date  . Allergy  seasonal  . Arthritis   . Breast cancer of upper-outer quadrant of right female breast ([place]) 12/16/2015  . Depression    mild depression after death of daughter- no meds  . History of radiation therapy 06/20/17-08/04/16   right breast 50.4 gy in 28 fractions, boost  10 Gy in 5 fractions  . Hot flashes   . Hx of adenomatous colonic polyps 04/25/2014  . Hypertension    Past Surgical History:  Procedure Laterality Date  . ABDOMINAL HYSTERECTOMY    . BREAST LUMPECTOMY WITH RADIOACTIVE SEED AND SENTINEL LYMPH NODE BIOPSY Right 05/05/2016   Procedure: BREAST LUMPECTOMY WITH RADIOACTIVE SEED AND SENTINEL LYMPH NODE BIOPSY;  Surgeon: Rolm Bookbinder, MD;  Location: [facility];  Service: General;  Laterality: Right;  . CHOLECYSTECTOMY N/A 09/09/2015   Procedure: LAPAROSCOPIC CHOLECYSTECTOMY;  Surgeon: Ralene Ok, MD;  Location: [facility];  Service: General;  Laterality: N/A;  . COLONOSCOPY    . KNEE ARTHROSCOPY  2016  . PERIPHERAL VASCULAR CATHETERIZATION Right 05/05/2016   Procedure: PORTA CATH REMOVAL;  Surgeon: Rolm Bookbinder, MD;  Location: [facility];  Service: General;  Laterality: Right;  . PORTACATH PLACEMENT Right 12/29/2015   Procedure: INSERTION PORT-A-CATH WITH ULTRASOUND GUIDANCE;  Surgeon: Rolm Bookbinder, MD;  Location: [facility];  Service: General;  Laterality: Right;     ALLERGIES:  Allergies  Allergen Reactions  . Percocet [Oxycodone-Acetaminophen] Diarrhea and Nausea And Vomiting  . Tramadol Nausea And Vomiting  . Vicodin [Hydrocodone-Acetaminophen] Nausea And Vomiting  . Gabapentin Other (See Comments)    "light headed, dizziness, didn't like the way it made me feel"  . Pollen Extract Other (See Comments)    Runny nose, itchy eyes and sneezing due to seasonal allergies.     CURRENT MEDICATIONS:  Outpatient Encounter Prescriptions as of 10/05/2016  Medication Sig  . EPINEPHrine (EPIPEN 2-PAK) 0.3 mg/0.3 mL IJ SOAJ injection Inject 0.3 mg into the muscle once as needed (anaphylaxis allergic reaction).   . fexofenadine (ALLEGRA) 180 MG tablet Take 180 mg by mouth daily.  Marland Kitchen lisinopril-hydrochlorothiazide (PRINZIDE,ZESTORETIC) 20-12.5 MG per tablet Take 1 tablet by mouth daily.  . meloxicam (MOBIC) 15  MG tablet Take 1 tablet by mouth as needed.  . Triamcinolone Acetonide (NASACORT ALLERGY 24HR NA) Place 1-2 sprays into the nose daily as needed (allergies).   No facility-administered encounter medications on file as of 10/05/2016.      ONCOLOGIC FAMILY HISTORY:  Family History  Problem Relation Age of Onset  . Hypertension Mother   . Breast cancer Maternal Grandfather   . Breast cancer Other   . Breast cancer Cousin   . Sudden death Neg Hx   . Diabetes Neg Hx   . Heart attack Neg Hx   . Hyperlipidemia Neg Hx   . Colon cancer Neg Hx   . Esophageal cancer Neg Hx   . Rectal cancer Neg Hx   . Stomach cancer Neg Hx      GENETIC COUNSELING/TESTING: Genetic testing was normal, and did not reveal a deleterious mutation in these genes.  Additionally, no variants of uncertain significance (VUSes) were found. Genes tested include: ATM, BARD1, BRCA1, BRCA2, BRIP1, CDH1, CHEK2, FANCC, MLH1, MSH2, MSH6, NBN, PALB2, PMS2, PTEN, RAD51C, RAD51D, TP53, and XRCC2.  This panel also includes deletion/duplication analysis (without sequencing) for one gene, EPCAM.    SOCIAL HISTORY:  JAKERRA FLOYD is married and lives with her husband and youngest son in [place], [place].  She has 2 living children and  one lives at home with them, and the other one lives in [place].  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  She denies any current or history of tobacco, alcohol, or illicit drug use.     PHYSICAL EXAMINATION:  Vital Signs:   Vitals:   10/05/16 0848  BP: (!) 109/58  Pulse: 83  Resp: 18  Temp: 97.7 F (36.5 C)   Filed Weights   10/05/16 0848  Weight: 207 lb 11.2 oz (94.2 kg)   General: Well-nourished, well-appearing female in no acute distress.  She is unaccompanied today.   HEENT: Head is normocephalic.  Pupils equal and reactive to light. Conjunctivae clear without exudate.  Sclerae anicteric. Oral mucosa is pink, moist.  Oropharynx is pink without lesions or  erythema.  Lymph: No cervical, supraclavicular, infraclavicular, or axillary lymphadenopathy noted on palpation.  Cardiovascular: Regular rate and rhythm.Marland Kitchen Respiratory: Clear to auscultation bilaterally. Chest expansion symmetric; breathing non-labored.  Breast: bilateral breasts without nodules, masses, skin/nipple changes, the right breast is hyperpigmented and slightly tender GI: Abdomen soft and round; non-tender, non-distended. Bowel sounds normoactive.  GU: Deferred.  Neuro: No focal deficits. Steady gait.  Psych: Mood and affect normal and appropriate for situation.  Extremities: No edema. Skin: Warm and dry.  LABORATORY DATA:  None for this visit.  DIAGNOSTIC IMAGING:  None for this visit.      ASSESSMENT AND PLAN:  Lindsay [name] is a pleasant 51 y.o. female with Stage IIA right breast invasive ductal carcinoma, ER-/PR-/HER2-, diagnosed in (date), treated with neo-adjuvant chemotherapy, lumpectomy, and adjuvant radiation therapy.  She presents to the Survivorship Clinic for our initial meeting and routine follow-up post-completion of treatment for breast cancer.    1. Stage IIA right breast cancer:  Lindsay [name] is continuing to recover from definitive treatment for breast cancer. She will follow-up with her medical oncologist, Dr. Jana Hakim in August, 2018 with history and physical exam per surveillance protocol.  Today, a comprehensive survivorship care plan and treatment summary was reviewed with the patient today detailing her breast cancer diagnosis, treatment course, potential late/long-term effects of treatment, appropriate follow-up care with recommendations for the future, and patient education resources.  A copy of this summary, along with a letter will be sent to the patient's primary care provider via mail/fax/In Basket message after today's visit.    2. Bone health:  Given Lindsay [name]'s history of breast cancer she is at slight risk for bone demineralization.  We discussed  consumption of foods rich in calcium, as well as performing weight-bearing activities.  She was given education on specific activities to promote bone health.  3. Cancer screening:  Due to Lindsay [name]'s history and her age, she should receive screening for skin cancers, colon cancer, and gynecologic cancers.  The information and recommendations are listed on the patient's comprehensive care plan/treatment summary and were reviewed in detail with the patient.    4. Health maintenance and wellness promotion: Lindsay [name] was encouraged to consume 5-7 servings of fruits and vegetables per day. We reviewed the "Nutrition Rainbow" handout, as well as the handout "Take Control of Your Health and Reduce Your Cancer Risk" from the [facility].  She was also encouraged to engage in moderate to vigorous exercise for 30 minutes per day most days of the week. We discussed the [employer] fitness program, which is designed for cancer survivors to help them become more physically fit after cancer treatments.  She was instructed to limit her alcohol  consumption and continue to abstain from tobacco use.     5. Support services/counseling: It is not uncommon for this period of the patient's cancer care trajectory to be one of many emotions and stressors.  We discussed an opportunity for her to participate in the next session of [facility] ("Finding Your New Normal") support group series designed for patients after they have completed treatment.   Lindsay [name] was encouraged to take advantage of our many other support services programs, support groups, and/or counseling in coping with her new life as a cancer survivor after completing anti-cancer treatment.  She was offered support today through active listening and expressive supportive counseling.  She was given information regarding our available services and encouraged to contact me with any questions or for help enrolling in any of our support group/programs.     Dispo:   -Return to cancer center in August as scheduled with Dr. Jana Hakim -Mammogram in July as ordered  -She is welcome to return back to the Survivorship Clinic at any time; no additional follow-up needed at this time.  -Consider referral back to survivorship as a long-term survivor for continued surveillance  A total of (30) minutes of face-to-face time was spent with this patient with greater than 50% of that time in counseling and care-coordination.   Gardenia Phlegm, [place] [phone]   Note: PRIMARY CARE PROVIDER Bernerd Limbo, [place] [phone]

## 2016-10-05 ENCOUNTER — Ambulatory Visit (HOSPITAL_BASED_OUTPATIENT_CLINIC_OR_DEPARTMENT_OTHER): Payer: BLUE CROSS/BLUE SHIELD | Admitting: Adult Health

## 2016-10-05 ENCOUNTER — Encounter: Payer: Self-pay | Admitting: Adult Health

## 2016-10-05 VITALS — BP 109/58 | HR 83 | Temp 97.7°F | Resp 18 | Ht 66.0 in | Wt 207.7 lb

## 2016-10-05 DIAGNOSIS — Z171 Estrogen receptor negative status [ER-]: Secondary | ICD-10-CM | POA: Diagnosis not present

## 2016-10-05 DIAGNOSIS — I10 Essential (primary) hypertension: Secondary | ICD-10-CM

## 2016-10-05 DIAGNOSIS — C50411 Malignant neoplasm of upper-outer quadrant of right female breast: Secondary | ICD-10-CM

## 2016-10-11 ENCOUNTER — Telehealth: Payer: Self-pay | Admitting: *Deleted

## 2016-10-11 NOTE — Telephone Encounter (Signed)
This RN received transferred VM from pt stating she has developed a soreness under her arm " like a lymphnode "  Return call number given as [phone].  This RN returned call and obtained pt's identified VM- message left informing pt to return call and if urgent to request to speak to a TRIAGE nurse.

## 2016-10-12 ENCOUNTER — Telehealth: Payer: Self-pay | Admitting: Oncology

## 2016-10-12 ENCOUNTER — Telehealth: Payer: Self-pay

## 2016-10-12 NOTE — Telephone Encounter (Signed)
Returned pt's call regarding concerns over her breast.  Pt states the area around where she had a biopsy Nov 2017 is now red, sore, and warm to touch since "sometime last week", but after she saw Wilber Bihari, NP on 5.30/18.  No open areas in skin, no oozing or discharge, no fevers.  Pt will be scheduled to see Wilber Bihari, NP this Friday.

## 2016-10-12 NOTE — Telephone Encounter (Signed)
lvm to inform pt of 6/8 appt at 0930 per sch msg

## 2016-10-14 ENCOUNTER — Ambulatory Visit: Payer: BLUE CROSS/BLUE SHIELD | Admitting: Adult Health

## 2016-11-03 ENCOUNTER — Encounter (HOSPITAL_COMMUNITY): Payer: Self-pay

## 2016-11-04 ENCOUNTER — Telehealth: Payer: Self-pay | Admitting: Oncology

## 2016-11-04 NOTE — Telephone Encounter (Signed)
Patient called and said she felt a lump in the outside of her right breast this morning.  Advised her that it most likely is scar tissue but it would be a good idea for her to come in for follow up.  Appointment was given for 11/07/16 at 4:30 with Dr. Sondra Come.

## 2016-11-07 ENCOUNTER — Ambulatory Visit
Admission: RE | Admit: 2016-11-07 | Discharge: 2016-11-07 | Disposition: A | Discharge status: Home / Self Care | Payer: BLUE CROSS/BLUE SHIELD | Source: Ambulatory Visit | Attending: Radiation Oncology | Admitting: Radiation Oncology

## 2016-11-07 ENCOUNTER — Encounter: Payer: Self-pay | Admitting: Radiation Oncology

## 2016-11-07 DIAGNOSIS — C50411 Malignant neoplasm of upper-outer quadrant of right female breast: Secondary | ICD-10-CM

## 2016-11-07 DIAGNOSIS — Z171 Estrogen receptor negative status [ER-]: Principal | ICD-10-CM

## 2016-11-07 NOTE — Progress Notes (Signed)
Radiation Oncology         [phone] ________________________________  Name: Lindsay [name] MRN: [id number]  Date: 11/07/2016  DOB: 06-06-1965  Follow-Up Visit Note  CC: Bernerd Limbo, MD  Bernerd Limbo, MD    ICD-10-CM   1. Malignant neoplasm of upper-outer quadrant of right breast in female, estrogen receptor negative ([place]) C50.411    Z17.1     Diagnosis:  Stage ypT2, ypN0, Mx grade 2-3 invasive ductal carcinoma of the right breast (triple negative)   Interval Since Last Radiation:  3 months 06/20/16 - 08/04/16 : 50.4 Gy to the right breast plus a 10 Gy boost  Narrative:  The patient returns today for follow-up of radiation completed 08/04/16, and a new questionable palpable right breast lump. She requested to be seen for question of a mass in the lower inner quadrant of right breast which she noticed last week. No nipple discharge, bleeding, or pain.  On review of systems, the patient reports some tenderness in the outer portion of her right breast. She denies frank pain. She palpated a lump in the lower inner quadrant of her right breast last Friday, 11/04/16, and is concerned about this. She reports her energy level is improving, and she has returned to full time work. She denies edema in her right arm or hand. No reports of nipple discharge or bleeding.           ALLERGIES:  is allergic to percocet [oxycodone-acetaminophen]; tramadol; vicodin [hydrocodone-acetaminophen]; gabapentin; and pollen extract.  Meds: Current Outpatient Prescriptions  Medication Sig Dispense Refill  . EPINEPHrine (EPIPEN 2-PAK) 0.3 mg/0.3 mL IJ SOAJ injection Inject 0.3 mg into the muscle once as needed (anaphylaxis allergic reaction).     . fexofenadine (ALLEGRA) 180 MG tablet Take 180 mg by mouth daily.    Marland Kitchen lisinopril-hydrochlorothiazide (PRINZIDE,ZESTORETIC) 20-12.5 MG per tablet Take 1 tablet by mouth daily.    . meloxicam (MOBIC) 15 MG tablet Take 1 tablet by mouth as needed.  0  . Triamcinolone  Acetonide (NASACORT ALLERGY 24HR NA) Place 1-2 sprays into the nose daily as needed (allergies).     No current facility-administered medications for this encounter.    REVIEW OF SYSTEMS: A 10+ POINT REVIEW OF SYSTEMS WAS OBTAINED including neurology, dermatology, psychiatry, cardiac, respiratory, lymph, extremities, GI, GU, musculoskeletal, constitutional, reproductive, HEENT. All pertinent positives are noted in the HPI. All others are negative.  Physical Findings: The patient is in no acute distress. Patient is alert and oriented.  height is 5\' 6"  (1.676 m) and weight is 206 lb 3.2 oz (93.5 kg). Her oral temperature is 98.1 F (36.7 C). Her blood pressure is 101/73 and her pulse is 76. Her oxygen saturation is 100%.  No significant changes. Lungs are clear to auscultation bilaterally. Heart has regular rate and rhythm. No palpable cervical, supraclavicular, or axillary adenopathy. Abdomen soft, non-tender, with normal bowel sounds. Left breast is large and pendulous without mass or nipple discharge. Right breast shows continued hyperpigmentation changes and some edema in the breast. No nipple discharge or bleeding. There is some thickening noted in the lower inner quadrant, but no discreet mass is palpated.  Lab Findings: Lab Results  Component Value Date   WBC 5.5 07/26/2016   HGB 12.7 07/26/2016   HCT 37.8 07/26/2016   MCV 84.0 07/26/2016   PLT 284 07/26/2016    Radiographic Findings: No results found.  Impression:  No palpable evidence of  recurrence on clinical exam, although sensitivity is decreased in light of  her continued edema in the breast.   Plan: Patient already has imaging scheduled for August, but we will move these studies up to this month. We will add ultrasound as recommended per Radiology. If imaging shows no suspicious areas, then PRN follow up with radiation oncology. She will see Dr. Jana Hakim and Dr. Donne Hazel next month as  scheduled.  -----------------------------------  Blair Promise, PhD, MD  This document serves as a record of services personally performed by Gery Pray, MD. It was created on his behalf by Maryla Morrow, a trained medical scribe. The creation of this record is based on the scribe's personal observations and the provider's statements to them. This document has been checked and approved by the attending provider.

## 2016-11-07 NOTE — Progress Notes (Addendum)
Lindsay [name] is here for follow up.  She reports having some tenderness in the outer portion of her right breast.  She palpated a lump in the lower inner quadrant of her right breast on Friday.  She does have a small area of firmness in the lower inner quadrant.  She reports her energy level is improving.    BP 101/73 (BP Location: Left Arm, Patient Position: Sitting)   Pulse 76   Temp 98.1 F (36.7 C) (Oral)   Ht 5\' 6"  (1.676 m)   Wt 206 lb 3.2 oz (93.5 kg)   SpO2 100%   BMI 33.28 kg/m    Wt Readings from Last 3 Encounters:  11/07/16 206 lb 3.2 oz (93.5 kg)  10/05/16 207 lb 11.2 oz (94.2 kg)  09/26/16 208 lb 9.6 oz (94.6 kg)

## 2016-11-08 ENCOUNTER — Telehealth: Payer: Self-pay | Admitting: *Deleted

## 2016-11-08 ENCOUNTER — Other Ambulatory Visit: Payer: Self-pay | Admitting: Oncology

## 2016-11-08 DIAGNOSIS — C50411 Malignant neoplasm of upper-outer quadrant of right female breast: Secondary | ICD-10-CM

## 2016-11-08 DIAGNOSIS — Z171 Estrogen receptor negative status [ER-]: Principal | ICD-10-CM

## 2016-11-08 NOTE — Telephone Encounter (Signed)
Called patient to inform of mammogram on 11-11-16 - arrival time - 8 am @ [facility], lvm for a return call

## 2016-11-14 ENCOUNTER — Telehealth: Payer: Self-pay | Admitting: Oncology

## 2016-11-14 NOTE — Telephone Encounter (Signed)
Patient called back and was advised of her mammogram results per Dr. Sondra Come.  She verbalized agreement and understanding.

## 2016-11-14 NOTE — Telephone Encounter (Signed)
Left a message for patient regarding her mammogram results.  Requested a return call. 

## 2016-11-22 ENCOUNTER — Encounter (HOSPITAL_COMMUNITY): Payer: Self-pay

## 2016-12-13 ENCOUNTER — Other Ambulatory Visit (HOSPITAL_BASED_OUTPATIENT_CLINIC_OR_DEPARTMENT_OTHER): Payer: BLUE CROSS/BLUE SHIELD

## 2016-12-13 DIAGNOSIS — C50411 Malignant neoplasm of upper-outer quadrant of right female breast: Secondary | ICD-10-CM | POA: Diagnosis not present

## 2016-12-13 LAB — COMPREHENSIVE METABOLIC PANEL
ALBUMIN: 3.9 g/dL (ref 3.5–5.0)
ALK PHOS: 49 U/L (ref 40–150)
ALT: 28 U/L (ref 0–55)
AST: 33 U/L (ref 5–34)
Anion Gap: 9 mEq/L (ref 3–11)
BUN: 14.5 mg/dL (ref 7.0–26.0)
CALCIUM: 10.4 mg/dL (ref 8.4–10.4)
CHLORIDE: 103 meq/L (ref 98–109)
CO2: 30 mEq/L — ABNORMAL HIGH (ref 22–29)
CREATININE: 0.8 mg/dL (ref 0.6–1.1)
EGFR: >90 mL/min/{1.73_m2} (ref >90)
Glucose: 78 mg/dl (ref 70–140)
Potassium: 3.8 mEq/L (ref 3.5–5.1)
SODIUM: 141 meq/L (ref 136–145)
Total Bilirubin: 0.77 mg/dL (ref 0.20–1.20)
Total Protein: 7.4 g/dL (ref 6.4–8.3)

## 2016-12-13 LAB — CBC WITH DIFFERENTIAL/PLATELET
BASO%: 0.9 % (ref 0.0–2.0)
BASOS ABS: 0 10*3/uL (ref 0.0–0.1)
EOS ABS: 0 10*3/uL (ref 0.0–0.5)
EOS%: 1.1 % (ref 0.0–7.0)
HCT: 37.8 % (ref 34.8–46.6)
HGB: 12.3 g/dL (ref 11.6–15.9)
LYMPH%: 34.9 % (ref 14.0–49.7)
MCH: 27.7 pg (ref 25.1–34.0)
MCHC: 32.6 g/dL (ref 31.5–36.0)
MCV: 84.9 fL (ref 79.5–101.0)
MONO#: 0.3 10*3/uL (ref 0.1–0.9)
MONO%: 11.3 % (ref 0.0–14.0)
NEUT#: 1.4 10*3/uL — ABNORMAL LOW (ref 1.5–6.5)
NEUT%: 51.8 % (ref 38.4–76.8)
Platelets: 248 10*3/uL (ref 145–400)
RBC: 4.45 10*6/uL (ref 3.70–5.45)
RDW: 13.6 % (ref 11.2–14.5)
WBC: 2.6 10*3/uL — ABNORMAL LOW (ref 3.9–10.3)
lymph#: 0.9 10*3/uL (ref 0.9–3.3)

## 2016-12-20 ENCOUNTER — Ambulatory Visit (HOSPITAL_BASED_OUTPATIENT_CLINIC_OR_DEPARTMENT_OTHER): Payer: BLUE CROSS/BLUE SHIELD | Admitting: Oncology

## 2016-12-20 VITALS — BP 109/67 | HR 79 | Temp 98.3°F | Resp 18 | Ht 66.0 in | Wt 204.1 lb

## 2016-12-20 DIAGNOSIS — Z171 Estrogen receptor negative status [ER-]: Secondary | ICD-10-CM | POA: Diagnosis not present

## 2016-12-20 DIAGNOSIS — Z733 Stress, not elsewhere classified: Secondary | ICD-10-CM

## 2016-12-20 DIAGNOSIS — C50411 Malignant neoplasm of upper-outer quadrant of right female breast: Secondary | ICD-10-CM | POA: Diagnosis not present

## 2016-12-20 MED ORDER — VENLAFAXINE HCL ER 75 MG PO CP24: 75.0000 mg | ORAL_CAPSULE | Freq: Every day | ORAL | 4 refills | Status: DC

## 2016-12-20 NOTE — Progress Notes (Signed)
[place]  [phone] [phone]) [phone]     ID: Lindsay [name] DOB: 24-Sep-1965  MR#: [id number]  ENI#:[id number]  Patient Care Team: Bernerd Limbo, MD as PCP - General (Family Medicine) Rolm Bookbinder, MD as Consulting Physician (General Surgery) Dondi Burandt, Virgie Dad, MD as Consulting Physician (Oncology) Gery Pray, MD as Consulting Physician (Radiation Oncology) Newton Pigg, MD as Consulting Physician (Obstetrics and Gynecology) Dorna Leitz, MD as Consulting Physician (Orthopedic Surgery) Delice Bison, Charlestine Massed, NP as Nurse Practitioner (Hematology and Oncology) OTHER MD:  CHIEF COMPLAINT: Invasive breast cancer  CURRENT TREATMENT: Observation   BREAST CANCER HISTORY: From the original intake note:  Aeisha had routine screening mammography with tomography at [facility] 12/04/2015. There was a new mass measuring 1.5 cm at the 11:00 position of the right breast. Right breast ultrasonography 12/10/2015 confirmed a 1.9 cm irregular mass in the upper-outer quadrant of the right breast posteriorly. There was a right axillary lymph node with focal cortical thickening.  On 12/14/2015 Mariely underwent biopsy of the right breast mass at 9:00 position as well as a suspicious lymph node. A separate area in the right breast 10 cm from the nipple was also biopsied biopsied. The final pathology ([id number]) showed the additional area and the lymph node to be benign. The 9:00 mass however was positive for invasive ductal carcinoma, grade 3, estrogen receptor and progesterone receptor negative, with an MIB-1 of 70%, and HER-2 nonamplified with a signals ratio 1.44, and the number per cell 2.70.  Her subsequent history is as detailed below  INTERVAL HISTORY: Mira returns today for follow-up of her triple negative breast cancer. She saw her last visit here she completed her radiation treatments. She did well with dose, aside from fatigue and questions regarding skin  changes.   Overall she tells me she is "making it". She was able to get back to work in May. She finds it works stressful. She has a slightly different job. She knows she can do it and she does do it but she feels very fatigued. For exercise she is doing a little bit of walking before work. Takes at home are "fine", except that her husband expects her to be back to the way she was before all this happened and that of course is unrealistic.  She participated in finding your new normal which was very useful for her.  REVIEW OF SYSTEMS: A detailed review of systems today was otherwise stable  PAST MEDICAL HISTORY: Past Medical History:  Diagnosis Date  . Allergy    seasonal  . Arthritis   . Breast cancer of upper-outer quadrant of right female breast ([place]) 12/16/2015  . Depression    mild depression after death of daughter- no meds  . History of radiation therapy 06/20/17-08/04/16   right breast 50.4 gy in 28 fractions, boost 10 Gy in 5 fractions  . Hot flashes   . Hx of adenomatous colonic polyps 04/25/2014  . Hypertension     PAST SURGICAL HISTORY: Past Surgical History:  Procedure Laterality Date  . ABDOMINAL HYSTERECTOMY    . BREAST LUMPECTOMY WITH RADIOACTIVE SEED AND SENTINEL LYMPH NODE BIOPSY Right 05/05/2016   Procedure: BREAST LUMPECTOMY WITH RADIOACTIVE SEED AND SENTINEL LYMPH NODE BIOPSY;  Surgeon: Rolm Bookbinder, MD;  Location: [facility];  Service: General;  Laterality: Right;  . CHOLECYSTECTOMY N/A 09/09/2015   Procedure: LAPAROSCOPIC CHOLECYSTECTOMY;  Surgeon: Ralene Ok, MD;  Location: [facility];  Service: General;  Laterality: N/A;  . COLONOSCOPY    .  KNEE ARTHROSCOPY  2016  . PERIPHERAL VASCULAR CATHETERIZATION Right 05/05/2016   Procedure: PORTA CATH REMOVAL;  Surgeon: Rolm Bookbinder, MD;  Location: [facility];  Service: General;  Laterality: Right;  . PORTACATH PLACEMENT Right 12/29/2015   Procedure: INSERTION PORT-A-CATH WITH  ULTRASOUND GUIDANCE;  Surgeon: Rolm Bookbinder, MD;  Location: [facility];  Service: General;  Laterality: Right;    FAMILY HISTORY Family History  Problem Relation Age of Onset  . Hypertension Mother   . Breast cancer Maternal Grandfather   . Breast cancer Other   . Breast cancer Cousin   . Sudden death Neg Hx   . Diabetes Neg Hx   . Heart attack Neg Hx   . Hyperlipidemia Neg Hx   . Colon cancer Neg Hx   . Esophageal cancer Neg Hx   . Rectal cancer Neg Hx   . Stomach cancer Neg Hx   The patient's father was murdered at age 10. The patient's mother is 78 years old as of August 2017. The patient has one brother, 2 sisters. A cousin was diagnosed with left breast cancer at age 68. A maternal aunt and a maternal grandmother were also diagnosed with breast cancer at age 62 and 78 respectively.  GYNECOLOGIC HISTORY:  No LMP recorded. Patient has had a hysterectomy.  Menarche age 1, first live birth age 68, the patient is GX P3. She is status post hysterectomy without salpingo-oophorectomy. She did not take hormone replacement. She used oral contraceptives remotely without complications.  SOCIAL HISTORY:  Lindsay [name] works as an Engineer, production at [employer]. Her husband Lindsay [name] is a news and record Lexicographer. Son Lindsay [name] lives in [place] and is a Games developer. Son Lindsay [name] more lives in [place] and is a Architectural technologist. The patient had a daughter who died at the age of 58.    ADVANCED DIRECTIVES: Not in place   HEALTH MAINTENANCE: Social History  Substance Use Topics  . Smoking status: Never Smoker  . Smokeless tobacco: Never Used  . Alcohol use No     Colonoscopy: 2016  PAP:  Bone density: Never   Allergies  Allergen Reactions  . Percocet [Oxycodone-Acetaminophen] Diarrhea and Nausea And Vomiting  . Tramadol Nausea And Vomiting  . Vicodin [Hydrocodone-Acetaminophen] Nausea And Vomiting  . Gabapentin Other (See Comments)    "light headed, dizziness, didn't like the way  it made me feel"  . Pollen Extract Other (See Comments)    Runny nose, itchy eyes and sneezing due to seasonal allergies.    Current Outpatient Prescriptions  Medication Sig Dispense Refill  . EPINEPHrine (EPIPEN 2-PAK) 0.3 mg/0.3 mL IJ SOAJ injection Inject 0.3 mg into the muscle once as needed (anaphylaxis allergic reaction).     . fexofenadine (ALLEGRA) 180 MG tablet Take 180 mg by mouth daily.    Marland Kitchen lisinopril-hydrochlorothiazide (PRINZIDE,ZESTORETIC) 20-12.5 MG per tablet Take 1 tablet by mouth daily.    . meloxicam (MOBIC) 15 MG tablet Take 1 tablet by mouth as needed.  0  . Triamcinolone Acetonide (NASACORT ALLERGY 24HR NA) Place 1-2 sprays into the nose daily as needed (allergies).     No current facility-administered medications for this visit.     OBJECTIVE: Middle-aged African-American woman Who appears stated age 51:   12/20/16 1209  BP: 109/67  Pulse: 79  Resp: 18  Temp: 98.3 F (36.8 C)  SpO2: 100%     Body mass index is 32.94 kg/m.    ECOG FS:1 - Symptomatic but completely ambulatory  Sclerae  unicteric, pupils round and equal Oropharynx clear and moist No cervical or supraclavicular adenopathy Lungs no rales or rhonchi Heart regular rate and rhythm Abd soft, nontender, positive bowel sounds MSK no focal spinal tenderness, no upper extremity lymphedema Neuro: nonfocal, well oriented, appropriate affect Breasts: The right breast is status post lumpectomy and radiation. There is hyperpigmentation over the port area, but no desquamation. The cosmetic result is good. The left breast is benign. Both axillae are benign.  LAB RESULTS:  CMP     Component Value Date/Time   NA 141 12/13/2016 1103   K 3.8 12/13/2016 1103   CL 100 (L) 04/09/2016 1310   CO2 30 (H) 12/13/2016 1103   GLUCOSE 78 12/13/2016 1103   BUN 14.5 12/13/2016 1103   CREATININE 0.8 12/13/2016 1103   CALCIUM 10.4 12/13/2016 1103   PROT 7.4 12/13/2016 1103   ALBUMIN 3.9 12/13/2016 1103   AST  33 12/13/2016 1103   ALT 28 12/13/2016 1103   ALKPHOS 49 12/13/2016 1103   BILITOT 0.77 12/13/2016 1103   GFRNONAA >60 04/09/2016 1310   GFRAA >60 04/09/2016 1310    INo results found for: SPEP, UPEP  Lab Results  Component Value Date   WBC 2.6 (L) 12/13/2016   NEUTROABS 1.4 (L) 12/13/2016   HGB 12.3 12/13/2016   HCT 37.8 12/13/2016   MCV 84.9 12/13/2016   PLT 248 12/13/2016      Chemistry      Component Value Date/Time   NA 141 12/13/2016 1103   K 3.8 12/13/2016 1103   CL 100 (L) 04/09/2016 1310   CO2 30 (H) 12/13/2016 1103   BUN 14.5 12/13/2016 1103   CREATININE 0.8 12/13/2016 1103      Component Value Date/Time   CALCIUM 10.4 12/13/2016 1103   ALKPHOS 49 12/13/2016 1103   AST 33 12/13/2016 1103   ALT 28 12/13/2016 1103   BILITOT 0.77 12/13/2016 1103       No results found for: LABCA2  No components found for: LABCA125  No results for input(s): INR in the last 168 hours.  Urinalysis    Component Value Date/Time   COLORURINE YELLOW 04/09/2016 1455   APPEARANCEUR CLOUDY (A) 04/09/2016 1455   LABSPEC 1.011 04/09/2016 1455   PHURINE 7.0 04/09/2016 1455   GLUCOSEU NEGATIVE 04/09/2016 1455   HGBUR NEGATIVE 04/09/2016 1455   BILIRUBINUR NEGATIVE 04/09/2016 1455   KETONESUR NEGATIVE 04/09/2016 1455   PROTEINUR NEGATIVE 04/09/2016 1455   UROBILINOGEN 1.0 03/03/2015 1240   NITRITE NEGATIVE 04/09/2016 1455   LEUKOCYTESUR NEGATIVE 04/09/2016 1455     STUDIES: No results found.  ELIGIBLE FOR AVAILABLE RESEARCH PROTOCOL: considered the PREVENT trial: decided against it  ASSESSMENT: 51 y.o. [place] woman status post right breast upper outer quadrant biopsy 12/14/2015 for a clinical T1c N0, stage IA  invasive ductal carcinoma, grade 3, triple negative, with an MIB-1 of 70%  (a) this stages as IIB In the 2018 new Prognostic classification  (1) genetics testing negative for mutations within any of 20 genes on the Breast/Ovarian Cancer Panel through [employer].  Additionally, no VUS were found.   (2) neoadjuvant chemotherapy consisting of doxorubicin and cyclophosphamide in dose this fashion 4, completed 02/22/2016, followed by paclitaxel weekly 5 (of 12 treatments planned), starting 03/08/2016, stopped 04/05/2016 after cycle 5 because of neuropathy  (a) cycle 4 of cyclophosphamide and doxorubicin held 1 week because of side effects  (3) status post right lumpectomy and sentinel lymph node sampling 05/05/2016 for a residual ypT2 ypN)  invasive ductal carcinoma, grade 3, the repeat prognostic panel again triple negative the residual cancer burden was 2  (4) adjuvant radiation 06/20/16 - 08/04/16 : 50.4 Gy to the right breast plus a 10 Gy boost  (5) left thyroid nodule biopsy 04/14/2016 read as [facility] 1   PLAN: Marcel has completed her systemic and local treatment for breast cancer and is entering observation. My expectation is that over the next 6-12 months she will approach her former functional status.  There are some communication issues at home. She is participating in "finding normal", but her husband did not come for the significant other sessions. We will discuss the communication issues if she is able to have him come into one of her regular routine visits.  Because of the stress at work and at home and a posttraumatic stress from her treatment, she is frequently tearful. I think she would benefit from venlafaxine. We discussed the possible toxicities side effects and palpitations of that agent and I went ahead and placed a low-dose prescription for her. She will let me know if it does not work or if it causes her any complications  Otherwise she will see me again in November. She will have labs and repeat mammography shortly before that visit  She knows to call for any other issues that may develop before then.    :Chauncey Cruel, MD   12/20/2016 12:13 PM Medical Oncology and Hematology [facility] [place], [place] [address] Tel. [phone]    Fax. [phone] you

## 2017-01-03 ENCOUNTER — Telehealth: Payer: Self-pay

## 2017-01-05 NOTE — Telephone Encounter (Signed)
Pt called with question possibly about effexor. Attempted to call back 3 times. LVM for her to call office again.

## 2017-02-13 ENCOUNTER — Emergency Department (HOSPITAL_COMMUNITY): Payer: BLUE CROSS/BLUE SHIELD

## 2017-02-13 ENCOUNTER — Encounter (HOSPITAL_COMMUNITY): Payer: Self-pay | Admitting: Emergency Medicine

## 2017-02-13 ENCOUNTER — Emergency Department (HOSPITAL_COMMUNITY)
Admission: EM | Admit: 2017-02-13 | Discharge: 2017-02-13 | Disposition: A | Discharge status: Home / Self Care | Payer: BLUE CROSS/BLUE SHIELD | Attending: Emergency Medicine | Admitting: Emergency Medicine

## 2017-02-13 DIAGNOSIS — Z79899 Other long term (current) drug therapy: Secondary | ICD-10-CM | POA: Diagnosis not present

## 2017-02-13 DIAGNOSIS — M25552 Pain in left hip: Secondary | ICD-10-CM | POA: Insufficient documentation

## 2017-02-13 DIAGNOSIS — I10 Essential (primary) hypertension: Secondary | ICD-10-CM | POA: Diagnosis not present

## 2017-02-13 DIAGNOSIS — M545 Low back pain, unspecified: Secondary | ICD-10-CM

## 2017-02-13 DIAGNOSIS — Z853 Personal history of malignant neoplasm of breast: Secondary | ICD-10-CM | POA: Diagnosis not present

## 2017-02-13 MED ORDER — CYCLOBENZAPRINE HCL 5 MG PO TABS: 5.0000 mg | ORAL_TABLET | Freq: Three times a day (TID) | ORAL | 0 refills | Status: DC | PRN

## 2017-02-13 MED ORDER — METHYLPREDNISOLONE 4 MG PO TBPK: ORAL_TABLET | ORAL | 0 refills | Status: DC

## 2017-02-13 NOTE — ED Provider Notes (Signed)
[facility] DEPT Provider Note   CSN: [id number] Arrival date & time: 02/13/17  2025     History   Chief Complaint Chief Complaint  Patient presents with  . Hip Pain    HPI Lindsay [name] is a 50 y.o. female.  The history is provided by the patient and medical records. No language interpreter was used.  Hip Pain  Pertinent negatives include no abdominal pain.   Lindsay [name] is a 51 y.o. female who presents to the Emergency Department complaining of bilateral low back pain and left hip pain which has been gradually worsening over the last 3-4 weeks. Ibuprofen taken with mild improvement. Pain is worse when sitting for prolonged amount of time or with certain movements. Denies history of similar sxs. Patient denies upper back or neck pain. No fever, saddle anesthesia, weakness, numbness, urinary complaints including retention/incontinence. No IVDU, or recent spinal procedures. Patient does have a history of breast cancer followed by oncology. She states that she is done with all her treatments and the most recent appointment everything looked great. She was told that they will just been monitoring her for now with repeat imaging next month.   Past Medical History:  Diagnosis Date  . Allergy    seasonal  . Arthritis   . Breast cancer of upper-outer quadrant of right female breast ([place]) 12/16/2015  . Depression    mild depression after death of daughter- no meds  . History of radiation therapy 06/20/17-08/04/16   right breast 50.4 gy in 28 fractions, boost 10 Gy in 5 fractions  . Hot flashes   . Hx of adenomatous colonic polyps 04/25/2014  . Hypertension     Patient Active Problem List   Diagnosis Date Noted  . Genetic testing 03/20/2016  . Family history of breast cancer in female 02/10/2016  . Malignant neoplasm of upper-outer quadrant of right breast in female, estrogen receptor negative ([place]) 12/16/2015  . Hx of adenomatous colonic polyps 04/25/2014  . Neck pain  11/06/2012    Past Surgical History:  Procedure Laterality Date  . ABDOMINAL HYSTERECTOMY    . BREAST LUMPECTOMY WITH RADIOACTIVE SEED AND SENTINEL LYMPH NODE BIOPSY Right 05/05/2016   Procedure: BREAST LUMPECTOMY WITH RADIOACTIVE SEED AND SENTINEL LYMPH NODE BIOPSY;  Surgeon: Rolm Bookbinder, MD;  Location: [facility];  Service: General;  Laterality: Right;  . CHOLECYSTECTOMY N/A 09/09/2015   Procedure: LAPAROSCOPIC CHOLECYSTECTOMY;  Surgeon: Ralene Ok, MD;  Location[facility];  Service: General;  Laterality: N/A;  . COLONOSCOPY    . KNEE ARTHROSCOPY  2016  . PERIPHERAL VASCULAR CATHETERIZATION Right 05/05/2016   Procedure: PORTA CATH REMOVAL;  Surgeon: Rolm Bookbinder, MD;  Location: [facility];  Service: General;  Laterality: Right;  . PORTACATH PLACEMENT Right 12/29/2015   Procedure: INSERTION PORT-A-CATH WITH ULTRASOUND GUIDANCE;  Surgeon: Rolm Bookbinder, MD;  Location: [facility];  Service: General;  Laterality: Right;    OB History    No data available       Home Medications    Prior to Admission medications   Medication Sig Start Date End Date Taking? Authorizing Provider  cyclobenzaprine (FLEXERIL) 5 MG tablet Take 1 tablet (5 mg total) by mouth 3 (three) times daily as needed (muscle tightness). 02/13/17   Ward, Ozella Almond, PA-C  EPINEPHrine (EPIPEN 2-PAK) 0.3 mg/0.3 mL IJ SOAJ injection Inject 0.3 mg into the muscle once as needed (anaphylaxis allergic reaction).  01/01/14   [provider]  fexofenadine (ALLEGRA) 180 MG tablet  Take 180 mg by mouth daily.    [provider]  lisinopril-hydrochlorothiazide (PRINZIDE,ZESTORETIC) 20-12.5 MG per tablet Take 1 tablet by mouth daily.    [provider]  meloxicam (MOBIC) 15 MG tablet Take 1 tablet by mouth as needed. 06/01/16   [provider]  methylPREDNISolone (MEDROL DOSEPAK) 4 MG TBPK tablet Take as directed on package. 02/13/17   Ward, Ozella Almond, PA-C    Triamcinolone Acetonide (NASACORT ALLERGY 24HR NA) Place 1-2 sprays into the nose daily as needed (allergies).    [provider]  venlafaxine XR (EFFEXOR-XR) 75 MG 24 hr capsule Take 1 capsule (75 mg total) by mouth daily with breakfast. 12/20/16   Magrinat, Virgie Dad, MD    Family History Family History  Problem Relation Age of Onset  . Hypertension Mother   . Breast cancer Maternal Grandfather   . Breast cancer Other   . Breast cancer Cousin   . Sudden death Neg Hx   . Diabetes Neg Hx   . Heart attack Neg Hx   . Hyperlipidemia Neg Hx   . Colon cancer Neg Hx   . Esophageal cancer Neg Hx   . Rectal cancer Neg Hx   . Stomach cancer Neg Hx     Social History Social History  Substance Use Topics  . Smoking status: Never Smoker  . Smokeless tobacco: Never Used  . Alcohol use No     Allergies   Percocet [oxycodone-acetaminophen]; Tramadol; Vicodin [hydrocodone-acetaminophen]; Gabapentin; and Pollen extract   Review of Systems Review of Systems  Constitutional: Negative for chills, fatigue and fever.  Gastrointestinal: Negative for abdominal pain, nausea and vomiting.  Genitourinary: Negative for difficulty urinating and dysuria.  Musculoskeletal: Positive for arthralgias, back pain and myalgias.  Skin: Negative for color change and wound.  Neurological: Negative for weakness and numbness.     Physical Exam Updated Vital Signs BP 127/78 (BP Location: Right Arm)   Pulse 79   Temp 98.1 F (36.7 C) (Oral)   Resp 18   Ht 5\' 6"  (1.676 m)   Wt 88.9 kg (196 lb)   SpO2 100%   BMI 31.64 kg/m   Physical Exam  Constitutional: She is oriented to person, place, and time. She appears well-developed and well-nourished.  Neck:  No midline or paraspinal tenderness. Full ROM without pain.  Cardiovascular: Normal rate, regular rhythm, normal heart sounds and intact distal pulses.   Pulmonary/Chest: Effort normal and breath sounds normal. No respiratory distress.   Abdominal: Soft. Bowel sounds are normal. She exhibits no distension. There is no tenderness.  Musculoskeletal:       Back:  Tenderness to palpation across L spine and to left lateral hip. 5/5 muscle strength in bilateral LE's. Straight leg raises are negative bilaterally for radicular symptoms. Able to ambulate independently with steady gait.  Neurological: She is alert and oriented to person, place, and time. She has normal reflexes.  Bilateral lower extremities neurovascularly intact.  Skin: Skin is warm and dry. No rash noted. No erythema.  Nursing note and vitals reviewed.    ED Treatments / Results  Labs (all labs ordered are listed, but only abnormal results are displayed) Labs Reviewed - No data to display  EKG  EKG Interpretation None       Radiology Dg Lumbar Spine Complete  Result Date: 02/13/2017 CLINICAL DATA:  Low back pain and LEFT greater than RIGHT hip pain for 4 weeks, no known injury EXAM: LUMBAR SPINE - COMPLETE 4+ VIEW COMPARISON:  None  FINDINGS: Five non-rib-bearing lumbar vertebra. Osseous mineralization normal for technique. Vertebral body heights maintained. Slight disc space narrowing at T11-T12 and T12-L1 with tiny endplate spurs. No acute fracture, subluxation or bone destruction. No spondylolysis. SI joints preserved. IMPRESSION: Mild degenerative disc disease changes at thoracolumbar junction. No acute bony abnormalities. Electronically Signed   By: Lavonia Dana M.D.   On: 02/13/2017 22:46   Dg Hips Bilat W Or Wo Pelvis 3-4 Views  Result Date: 02/13/2017 CLINICAL DATA:  Low back pain and LEFT greater than RIGHT hip pain for 4 weeks, no known injury EXAM: DG HIP (WITH OR WITHOUT PELVIS) 3-4V BILAT COMPARISON:  None FINDINGS: Osseous mineralization normal for technique. Hip and SI joint spaces preserved. No acute fracture, dislocation, or bone destruction. Scattered pelvic phleboliths. IMPRESSION: No acute osseous abnormalities. Electronically Signed   By:  Lavonia Dana M.D.   On: 02/13/2017 22:45    Procedures Procedures (including critical care time)  Medications Ordered in ED Medications - No data to display   Initial Impression / Assessment and Plan / ED Course  I have reviewed the triage vital signs and the nursing notes.  Pertinent labs & imaging results that were available during my care of the patient were reviewed by me and considered in my medical decision making (see chart for details).    Lindsay [name] is a 51 y.o. female who presents to ED for low back pain and left lateral hip pain x 4 weeks. Patient demonstrates no lower extremity weakness, saddle anesthesia, bowel or bladder incontinence or neuro deficits. No concern for cauda equina. No fevers or other infectious symptoms to suggest that the patient's back pain is due to an infection. Lower extremities are neurovascularly intact and patient is ambulating without difficulty. X-ray obtained with no evidence of bony mets or acute injury. I have reviewed return precautions, including the development of any of these signs or symptoms and the patient has voiced understanding. PCP follow-up if symptoms do not improve. Patient voiced understanding and agreement with plan. All questions answered.   Final Clinical Impressions(s) / ED Diagnoses   Final diagnoses:  Acute bilateral low back pain without sciatica    New Prescriptions New Prescriptions   CYCLOBENZAPRINE (FLEXERIL) 5 MG TABLET    Take 1 tablet (5 mg total) by mouth 3 (three) times daily as needed (muscle tightness).   METHYLPREDNISOLONE (MEDROL DOSEPAK) 4 MG TBPK TABLET    Take as directed on package.     Ward, Ozella Almond, PA-C 02/13/17 7829    Charlesetta Shanks, MD 02/14/17 1455

## 2017-02-13 NOTE — ED Notes (Signed)
Pt assessed and discharged by [facility]. Family at bedside

## 2017-02-13 NOTE — Discharge Instructions (Signed)
It was my pleasure taking care of you today!   You have been seen in the Emergency Department today for back pain.   Flexeril is your muscle relaxer. Take steroids as directed. You can also take ibuprofen as needed. In addition to this, use ice and/or heat for additional pain relief.  Your back pain should get better over the next 2 weeks. Please follow up with your doctor this week for a recheck if still having symptoms.  COLD THERAPY DIRECTIONS:  Ice or gel packs can be used to reduce both pain and swelling. Ice is the most helpful within the first 24 to 48 hours after an injury or flareup from overusing a muscle or joint.  Ice is effective, has very few side effects, and is safe for most people to use.    Return to the ED for worsening back pain, fever, weakness or numbness of either leg, or if you develop either (1) an inability to urinate or have bowel movements, or (2) loss of your ability to control your bathroom functions (if you start having "accidents"), or if you develop other new symptoms that concern you.

## 2017-02-13 NOTE — ED Triage Notes (Signed)
Patient c/o left hip pain radiating down left leg and right sided back pain x4 weeks. Reports she has been exercising more recently. States pain worsens with movement. Denies urinary sx and numbness/tingling. Ambulatory.

## 2017-02-26 ENCOUNTER — Emergency Department (HOSPITAL_COMMUNITY)
Admission: EM | Admit: 2017-02-26 | Discharge: 2017-02-26 | Disposition: A | Discharge status: Home / Self Care | Payer: BLUE CROSS/BLUE SHIELD | Attending: Emergency Medicine | Admitting: Emergency Medicine

## 2017-02-26 ENCOUNTER — Encounter (HOSPITAL_COMMUNITY): Payer: Self-pay | Admitting: Emergency Medicine

## 2017-02-26 DIAGNOSIS — Z79899 Other long term (current) drug therapy: Secondary | ICD-10-CM | POA: Insufficient documentation

## 2017-02-26 DIAGNOSIS — M5410 Radiculopathy, site unspecified: Secondary | ICD-10-CM | POA: Insufficient documentation

## 2017-02-26 DIAGNOSIS — M545 Low back pain: Secondary | ICD-10-CM | POA: Diagnosis present

## 2017-02-26 DIAGNOSIS — Z853 Personal history of malignant neoplasm of breast: Secondary | ICD-10-CM | POA: Diagnosis not present

## 2017-02-26 DIAGNOSIS — Z885 Allergy status to narcotic agent status: Secondary | ICD-10-CM | POA: Diagnosis not present

## 2017-02-26 DIAGNOSIS — I10 Essential (primary) hypertension: Secondary | ICD-10-CM | POA: Insufficient documentation

## 2017-02-26 MED ORDER — CYCLOBENZAPRINE HCL 5 MG PO TABS: 5.0000 mg | ORAL_TABLET | Freq: Three times a day (TID) | ORAL | 0 refills | Status: DC | PRN

## 2017-02-26 MED ORDER — IBUPROFEN 800 MG PO TABS: 800.0000 mg | ORAL_TABLET | Freq: Three times a day (TID) | ORAL | 0 refills | Status: DC

## 2017-02-26 MED ORDER — PREDNISONE 20 MG PO TABS: ORAL_TABLET | ORAL | 0 refills | Status: DC

## 2017-02-26 NOTE — ED Notes (Signed)
Bed: WTR8 Expected date:  Expected time:  Means of arrival:  Comments: 

## 2017-02-26 NOTE — ED Provider Notes (Signed)
[facility] DEPT Provider Note   CSN: [id number] Arrival date & time: 02/26/17  1358     History   Chief Complaint Chief Complaint  Patient presents with  . Back Pain    HPI Lindsay [name] is a 51 y.o. female.  HPI   51 year old female with remote hx of breast cancer, arthritis, HTN presenting with back pain. Patient report for more than month she has had pain to her low back that radiates down to her left leg. Pain is described as a burning sensation, radiates to back and side of her left leg towards her knee. Pain is worse with direct pressure and more noticeable at nighttime. No report of any specific injury. No reported fever chills, or rash. Denies bowel bladder incontinence or saddle anesthesia. Endorse occasional nausea without any other vomiting or diarrhea. Report intermittent numbness of the left toe. She was seen in the ER 2 weeks ago for her pain. X-ray of the lumbar spine and left hip was obtained without any acute pathology. Patient was discharged home with a steroid dose and muscle relaxant. She has been taking that with minimal relief. Her PCP did recommend physical therapy. Patient does work in a job that require prolonged standing. Standing does aggravate her symptoms. No hx of IVDU.   Past Medical History:  Diagnosis Date  . Allergy    seasonal  . Arthritis   . Breast cancer of upper-outer quadrant of right female breast ([place]) 12/16/2015  . Depression    mild depression after death of daughter- no meds  . History of radiation therapy 06/20/17-08/04/16   right breast 50.4 gy in 28 fractions, boost 10 Gy in 5 fractions  . Hot flashes   . Hx of adenomatous colonic polyps 04/25/2014  . Hypertension     Patient Active Problem List   Diagnosis Date Noted  . Genetic testing 03/20/2016  . Family history of breast cancer in female 02/10/2016  . Malignant neoplasm of upper-outer quadrant of right breast in female, estrogen receptor  negative ([place]) 12/16/2015  . Hx of adenomatous colonic polyps 04/25/2014  . Neck pain 11/06/2012    Past Surgical History:  Procedure Laterality Date  . ABDOMINAL HYSTERECTOMY    . BREAST LUMPECTOMY WITH RADIOACTIVE SEED AND SENTINEL LYMPH NODE BIOPSY Right 05/05/2016   Procedure: BREAST LUMPECTOMY WITH RADIOACTIVE SEED AND SENTINEL LYMPH NODE BIOPSY;  Surgeon: Rolm Bookbinder, MD;  Location: [facility];  Service: General;  Laterality: Right;  . CHOLECYSTECTOMY N/A 09/09/2015   Procedure: LAPAROSCOPIC CHOLECYSTECTOMY;  Surgeon: Ralene Ok, MD;  Location: [facility];  Service: General;  Laterality: N/A;  . COLONOSCOPY    . KNEE ARTHROSCOPY  2016  . PERIPHERAL VASCULAR CATHETERIZATION Right 05/05/2016   Procedure: PORTA CATH REMOVAL;  Surgeon: Rolm Bookbinder, MD;  Location: [place] [place];  Service: General;  Laterality: Right;  . PORTACATH PLACEMENT Right 12/29/2015   Procedure: INSERTION PORT-A-CATH WITH ULTRASOUND GUIDANCE;  Surgeon: Rolm Bookbinder, MD;  Location: [facility];  Service: General;  Laterality: Right;    OB History    No data available       Home Medications    Prior to Admission medications   Medication Sig Start Date End Date Taking? Authorizing Provider  cyclobenzaprine (FLEXERIL) 5 MG tablet Take 1 tablet (5 mg total) by mouth 3 (three) times daily as needed (muscle tightness). 02/13/17   Ward, Ozella Almond, PA-C  EPINEPHrine (EPIPEN 2-PAK) 0.3 mg/0.3 mL IJ SOAJ injection Inject  0.3 mg into the muscle once as needed (anaphylaxis allergic reaction).  01/01/14   [provider]  fexofenadine (ALLEGRA) 180 MG tablet Take 180 mg by mouth daily.    [provider]  lisinopril-hydrochlorothiazide (PRINZIDE,ZESTORETIC) 20-12.5 MG per tablet Take 1 tablet by mouth daily.    [provider]  meloxicam (MOBIC) 15 MG tablet Take 1 tablet by mouth as needed. 06/01/16   [provider]  methylPREDNISolone (MEDROL  DOSEPAK) 4 MG TBPK tablet Take as directed on package. 02/13/17   Ward, Ozella Almond, PA-C  Triamcinolone Acetonide (NASACORT ALLERGY 24HR NA) Place 1-2 sprays into the nose daily as needed (allergies).    [provider]  venlafaxine XR (EFFEXOR-XR) 75 MG 24 hr capsule Take 1 capsule (75 mg total) by mouth daily with breakfast. 12/20/16   Magrinat, Virgie Dad, MD    Family History Family History  Problem Relation Age of Onset  . Hypertension Mother   . Breast cancer Maternal Grandfather   . Breast cancer Other   . Breast cancer Cousin   . Sudden death Neg Hx   . Diabetes Neg Hx   . Heart attack Neg Hx   . Hyperlipidemia Neg Hx   . Colon cancer Neg Hx   . Esophageal cancer Neg Hx   . Rectal cancer Neg Hx   . Stomach cancer Neg Hx     Social History Social History  Substance Use Topics  . Smoking status: Never Smoker  . Smokeless tobacco: Never Used  . Alcohol use No     Allergies   Percocet [oxycodone-acetaminophen]; Tramadol; Vicodin [hydrocodone-acetaminophen]; Gabapentin; and Pollen extract   Review of Systems Review of Systems  All other systems reviewed and are negative.    Physical Exam Updated Vital Signs BP 129/85   Pulse (!) 102   Temp 98.4 F (36.9 C) (Oral)   Resp 16   SpO2 100%   Physical Exam  Constitutional: She appears well-developed and well-nourished. No distress.  HENT:  Head: Atraumatic.  Eyes: Conjunctivae are normal.  Neck: Neck supple.  Cardiovascular: Intact distal pulses.   Abdominal: Soft. There is no tenderness.  Musculoskeletal: She exhibits tenderness (mild tenderness along lumbar spine and left lumbosacral region.  negative straight leg raise.  ).  Neurological: She is alert. She displays normal reflexes.  Ambulate without difficulty.   Skin: No rash noted.  Psychiatric: She has a normal mood and affect.  Nursing note and vitals reviewed.    ED Treatments / Results  Labs (all labs ordered are listed, but only  abnormal results are displayed) Labs Reviewed - No data to display  EKG  EKG Interpretation None       Radiology No results found.  Procedures Procedures (including critical care time)  Medications Ordered in ED Medications - No data to display   Initial Impression / Assessment and Plan / ED Course  I have reviewed the triage vital signs and the nursing notes.  Pertinent labs & imaging results that were available during my care of the patient were reviewed by me and considered in my medical decision making (see chart for details).     BP 129/85   Pulse (!) 102   Temp 98.4 F (36.9 C) (Oral)   Resp 16   SpO2 100%    Final Clinical Impressions(s) / ED Diagnoses   Final diagnoses:  Radicular low back pain    New Prescriptions New Prescriptions   No medications on file   No red flag  s/s of low back pain. Patient was counseled on back pain precautions and told to do activity as tolerated but do not lift, push, or pull heavy objects more than 10 pounds for the next week. Patient counseled to use ice or heat on back for no longer than 15 minutes every hour.   Patient prescribed muscle relaxer and counseled on proper use of muscle relaxant medication.   Patient urged to follow-up with PCP if pain does not improve with treatment and rest or if pain becomes recurrent. Urged to return with worsening severe pain, loss of bowel or bladder control, trouble walking.  The patient verbalizes understanding and agrees with the plan.    Domenic Moras, PA-C 02/26/17 1458    Quintella Reichert, MD 02/27/17 [phone]

## 2017-02-26 NOTE — ED Triage Notes (Addendum)
Pt reports R buttock pain radiating around to front of leg for the past 4 weeks. No acute injury, but has been working out more for the past 2 months. Pain worse with sitting. Was seen here for same on 10/8, but pain has not improved. Unable to follow up until November.

## 2017-02-26 NOTE — ED Notes (Signed)
Bed: WTR9 Expected date: 02/26/17 Expected time:  Means of arrival: Not Known at Arrival Comments:

## 2017-02-27 ENCOUNTER — Telehealth: Payer: Self-pay | Admitting: Medical

## 2017-02-27 ENCOUNTER — Telehealth: Payer: Self-pay

## 2017-02-27 ENCOUNTER — Ambulatory Visit (HOSPITAL_BASED_OUTPATIENT_CLINIC_OR_DEPARTMENT_OTHER): Payer: BLUE CROSS/BLUE SHIELD | Admitting: Medical

## 2017-02-27 VITALS — BP 112/76 | HR 90 | Temp 98.9°F | Resp 20 | Ht 66.0 in | Wt 200.2 lb

## 2017-02-27 DIAGNOSIS — M5432 Sciatica, left side: Secondary | ICD-10-CM

## 2017-02-27 DIAGNOSIS — I10 Essential (primary) hypertension: Secondary | ICD-10-CM

## 2017-02-27 DIAGNOSIS — R1012 Left upper quadrant pain: Secondary | ICD-10-CM

## 2017-02-27 NOTE — Telephone Encounter (Signed)
Pt added to VT's schedule c/o pain in left glut that radiates to abdomen and down left leg.

## 2017-02-27 NOTE — Progress Notes (Signed)
[facility] Progress Note   Lindsay [name] [id number] 1965-05-20 51 y.o.  Lindsay [name] is managed by Dr. Gunnar Bulla Magrinat  Actively treated with chemotherapy: no  Last Treated: 04/05/2016  Assessment: Plan:    Left upper quadrant pain  Sciatica of left side:   Left upper quadrant pain: I encouraged the patient to proceed with the MRI that has been scheduled for her on 03/01/2017.  Sciatica of the left side: Encouraged the patient to take Motrin 800 mg by mouth 3 times a day with food and to take Flexeril as prescribed. I encouraged her to use heat or ice to the area as directed by the emergency room. I encouraged her to keep her orthopedic and physical therapy appointments which are scheduled for 1025/2018. I told the patient not to take meloxicam with Motrin.  Please see After Visit Summary for patient specific instructions.  Future Appointments Date Time Provider Bernard  03/02/2017 3:30 PM Sumner Boast, PT [facility]  03/16/2017 9:30 AM [facility] None  03/16/2017 10:00 AM Magrinat, Virgie Dad, MD [facility] None    No orders of the defined types were placed in this encounter.      Subjective:   Patient ID:  Lindsay [name] is a 51 y.o. (DOB 04/11/1966) female.  Chief Complaint:  Chief Complaint  Patient presents with  . Back Pain    HPI Lindsay [name] is a 51 year old female with a history of a stage IA (T1c, N0) invasive ductal carcinoma, grade 3, triple negative with MIB-1 of 70%. The patient is status post chemotherapy with doxorubicin and cyclophosphamide 4 which she completed on 02/22/2016 followed by weekly paclitaxel with only 5 weekly doses given due to the development of neuropathy. Her chemotherapy was completed on 04/05/2016. She was given radiation therapy from 06/20/2016 through 08/04/2016. The patient is being followed with conservative management at this time. She was last seen by Dr. Gunnar Bulla Magrinat on  12/20/2016 and has follow-up appointment with him on 03/16/2017.   She presents to the office today with ongoing left-sided lower back pain with left-sided sciatica and abdominal pain. The patient was seen at [facility] on 02/20/2017 for this and was given meloxicam 50 mg by mouth once daily, referred for physical therapy, and was scheduled to have an MRI of the abdomen and pelvis to follow-up on a 6 mm echogenic lesion in the lower pole of the left kidney. According to the note from Dr. Bernerd Limbo, nothing was found to explain the patient's left upper quadrant abdominal pain. She was seen in the emergency room at [facility] on 02/26/2017 with a report of ongoing back pain. The patient was given a prescription for muscle relaxers and was told to use heat or ice to her back and told to follow-up with her PCP.  According to the patient's chart she had been given a prescription for Flexeril 5 mg by mouth 3 times a day and a Medrol Dosepak from [facility], PA-C on 02/13/2017. She states that she only took steroids for one to two days and stop them as they did not help. She has an orthopedic and physical therapy appointment on 03/02/2017 and has an MRI scheduled for 03/01/2017. She was also given a prescription for Motrin 800 mg by mouth 3 times a day.   Medications: I have reviewed the patient's current medications.  Allergies:  Allergies  Allergen Reactions  . Percocet [Oxycodone-Acetaminophen] Diarrhea and Nausea  And Vomiting  . Tramadol Nausea And Vomiting  . Vicodin [Hydrocodone-Acetaminophen] Nausea And Vomiting  . Gabapentin Other (See Comments)    "light headed, dizziness, didn't like the way it made me feel"  . Pollen Extract Other (See Comments)    Runny nose, itchy eyes and sneezing due to seasonal allergies.    Past Medical History:  Diagnosis Date  . Allergy    seasonal  . Arthritis   . Breast cancer of upper-outer quadrant of right female breast  ([place]) 12/16/2015  . Depression    mild depression after death of daughter- no meds  . History of radiation therapy 06/20/17-08/04/16   right breast 50.4 gy in 28 fractions, boost 10 Gy in 5 fractions  . Hot flashes   . Hx of adenomatous colonic polyps 04/25/2014  . Hypertension     Past Surgical History:  Procedure Laterality Date  . ABDOMINAL HYSTERECTOMY    . BREAST LUMPECTOMY WITH RADIOACTIVE SEED AND SENTINEL LYMPH NODE BIOPSY Right 05/05/2016   Procedure: BREAST LUMPECTOMY WITH RADIOACTIVE SEED AND SENTINEL LYMPH NODE BIOPSY;  Surgeon: Rolm Bookbinder, MD;  Location: [facility];  Service: General;  Laterality: Right;  . CHOLECYSTECTOMY N/A 09/09/2015   Procedure: LAPAROSCOPIC CHOLECYSTECTOMY;  Surgeon: Ralene Ok, MD;  Location: [facility];  Service: General;  Laterality: N/A;  . COLONOSCOPY    . KNEE ARTHROSCOPY  2016  . PERIPHERAL VASCULAR CATHETERIZATION Right 05/05/2016   Procedure: PORTA CATH REMOVAL;  Surgeon: Rolm Bookbinder, MD;  Location: [facility];  Service: General;  Laterality: Right;  . PORTACATH PLACEMENT Right 12/29/2015   Procedure: INSERTION PORT-A-CATH WITH ULTRASOUND GUIDANCE;  Surgeon: Rolm Bookbinder, MD;  Location: [place];  Service: General;  Laterality: Right;    Family History  Problem Relation Age of Onset  . Hypertension Mother   . Breast cancer Maternal Grandfather   . Breast cancer Other   . Breast cancer Cousin   . Sudden death Neg Hx   . Diabetes Neg Hx   . Heart attack Neg Hx   . Hyperlipidemia Neg Hx   . Colon cancer Neg Hx   . Esophageal cancer Neg Hx   . Rectal cancer Neg Hx   . Stomach cancer Neg Hx     Social History   Social History  . Marital status: Married    Spouse name: N/A  . Number of children: N/A  . Years of education: N/A   Occupational History  . Not on file.   Social History Main Topics  . Smoking status: Never Smoker  . Smokeless tobacco: Never Used  . Alcohol use No  . Drug  use: No  . Sexual activity: Yes    Birth control/ protection: Surgical   Other Topics Concern  . Not on file   Social History Narrative  . No narrative on file    Past Medical History, Surgical history, Social history, and Family history were reviewed and updated as appropriate.   Please see review of systems for further details on the patient's review from today.   Review of Systems:  Review of Systems  Gastrointestinal: Positive for abdominal pain.  Musculoskeletal: Positive for myalgias (Left buttock pain with radiation inferiorly along the posterior left leg.).    Objective:   Physical Exam:  BP 112/76 (BP Location: Left Arm, Patient Position: Sitting)   Pulse 90   Temp 98.9 F (37.2 C) (Oral)   Resp 20   Ht 5\' 6"  (1.676 m)   Wt 200  lb 3.2 oz (90.8 kg)   SpO2 100%   BMI 32.31 kg/m  ECOG: 0  Physical Exam  Constitutional: She appears well-developed. No distress.  Neurological: She is alert. Coordination normal.  The remainder the patient's physical exam was deferred given her extensive evaluation to date.  Lab Review:     Component Value Date/Time   NA 141 12/13/2016 1103   K 3.8 12/13/2016 1103   CL 100 (L) 04/09/2016 1310   CO2 30 (H) 12/13/2016 1103   GLUCOSE 78 12/13/2016 1103   BUN 14.5 12/13/2016 1103   CREATININE 0.8 12/13/2016 1103   CALCIUM 10.4 12/13/2016 1103   PROT 7.4 12/13/2016 1103   ALBUMIN 3.9 12/13/2016 1103   AST 33 12/13/2016 1103   ALT 28 12/13/2016 1103   ALKPHOS 49 12/13/2016 1103   BILITOT 0.77 12/13/2016 1103   GFRNONAA >60 04/09/2016 1310   GFRAA >60 04/09/2016 1310       Component Value Date/Time   WBC 2.6 (L) 12/13/2016 1103   WBC 1.3 (LL) 04/09/2016 1310   RBC 4.45 12/13/2016 1103   RBC 3.68 (L) 04/09/2016 1310   HGB 12.3 12/13/2016 1103   HCT 37.8 12/13/2016 1103   PLT 248 12/13/2016 1103   MCV 84.9 12/13/2016 1103   MCH 27.7 12/13/2016 1103   MCH 29.6 04/09/2016 1310   MCHC 32.6 12/13/2016 1103   MCHC 32.3  04/09/2016 1310   RDW 13.6 12/13/2016 1103   LYMPHSABS 0.9 12/13/2016 1103   MONOABS 0.3 12/13/2016 1103   EOSABS 0.0 12/13/2016 1103   BASOSABS 0.0 12/13/2016 1103   -------------------------------  Imaging from last 24 hours (if applicable):  Radiology interpretation: Dg Lumbar Spine Complete  Result Date: 02/13/2017 CLINICAL DATA:  Low back pain and LEFT greater than RIGHT hip pain for 4 weeks, no known injury EXAM: LUMBAR SPINE - COMPLETE 4+ VIEW COMPARISON:  None FINDINGS: Five non-rib-bearing lumbar vertebra. Osseous mineralization normal for technique. Vertebral body heights maintained. Slight disc space narrowing at T11-T12 and T12-L1 with tiny endplate spurs. No acute fracture, subluxation or bone destruction. No spondylolysis. SI joints preserved. IMPRESSION: Mild degenerative disc disease changes at thoracolumbar junction. No acute bony abnormalities. Electronically Signed   By: Lavonia Dana M.D.   On: 02/13/2017 22:46   Dg Hips Bilat W Or Wo Pelvis 3-4 Views  Result Date: 02/13/2017 CLINICAL DATA:  Low back pain and LEFT greater than RIGHT hip pain for 4 weeks, no known injury EXAM: DG HIP (WITH OR WITHOUT PELVIS) 3-4V BILAT COMPARISON:  None FINDINGS: Osseous mineralization normal for technique. Hip and SI joint spaces preserved. No acute fracture, dislocation, or bone destruction. Scattered pelvic phleboliths. IMPRESSION: No acute osseous abnormalities. Electronically Signed   By: Lavonia Dana M.D.   On: 02/13/2017 22:45

## 2017-02-27 NOTE — Telephone Encounter (Signed)
No 10/22 los  

## 2017-03-02 ENCOUNTER — Other Ambulatory Visit: Payer: Self-pay | Admitting: Oncology

## 2017-03-02 ENCOUNTER — Ambulatory Visit: Payer: BLUE CROSS/BLUE SHIELD | Admitting: Physical Therapy

## 2017-03-02 DIAGNOSIS — C7951 Secondary malignant neoplasm of bone: Principal | ICD-10-CM

## 2017-03-02 DIAGNOSIS — C50919 Malignant neoplasm of unspecified site of unspecified female breast: Secondary | ICD-10-CM | POA: Insufficient documentation

## 2017-03-02 NOTE — Progress Notes (Signed)
[place]  [phone] [phone]     ID: Lindsay [name] DOB: 1965-07-06  MR#: [id number]  CSN#:[id number]  Patient Care Team: Bernerd Limbo, MD as PCP - General (Family Medicine) Rolm Bookbinder, MD as Consulting Physician (General Surgery) Alaiyah Bollman, Virgie Dad, MD as Consulting Physician (Oncology) Gery Pray, MD as Consulting Physician (Radiation Oncology) Newton Pigg, MD as Consulting Physician (Obstetrics and Gynecology) Dorna Leitz, MD as Consulting Physician (Orthopedic Surgery) Delice Bison, Charlestine Massed, NP as Nurse Practitioner (Hematology and Oncology) OTHER MD:  CHIEF COMPLAINT: Triple negative stage IV breast cancer  CURRENT TREATMENT: To start keep Cytovene   BREAST CANCER HISTORY: From the original intake note:  Lindsay [name] had routine screening mammography with tomography at [facility] 12/04/2015. There was a new mass measuring 1.5 cm at the 11:00 position of the right breast. Right breast ultrasonography 12/10/2015 confirmed a 1.9 cm irregular mass in the upper-outer quadrant of the right breast posteriorly. There was a right axillary lymph node with focal cortical thickening.  On 12/14/2015 Lindsay [name] underwent biopsy of the right breast mass at 9:00 position as well as a suspicious lymph node. A separate area in the right breast 10 cm from the nipple was also biopsied biopsied. The final pathology ([id number]) showed the additional area and the lymph node to be benign. The 9:00 mass however was positive for invasive ductal carcinoma, grade 3, estrogen receptor and progesterone receptor negative, with an MIB-1 of 70%, and HER-2 nonamplified with a signals ratio 1.44, and the number per cell 2.70.  Her subsequent history is as detailed below  INTERVAL HISTORY: Lindsay [name] returns today for follow-up of her triple negative breast cancer. She is accompanied by her husband and notes that she is doing well overall. She was evaluated by Dr. Coletta Memos due  to thinking that she pulled a muscle to her left flank area. Dr. Coletta Memos ordered an [place] and MRI. She is not sure of the results of these tests.   Since her last visit to the office she has had an LUQ [place] completed on 02/15/2017 with results of: 6 mm echogenic lesion lower pole left kidney, probably AML although tiny neoplasm could have this appearance. MRI of the abdomen without and with contrast recommended to further evaluate. She also had a MR Abdomen on 03/01/2017 with results revealing: 1. New diffuse metastatic disease since prior CT scans from 2017. This is likely metastatic breast cancer given the patient's history of breast cancer. There are metastatic pulmonary lesions, metastatic hepatic lesions, diffuse bone metastasis and a left paraspinal mass at L3-4. 2. No worrisome renal lesions.Simple appearing renal cysts.Marland Kitchen     REVIEW OF SYSTEMS: Lindsay [name] reports that she takes walks as her form of exercise. She has lost approximately 8 lbs. She reports mild SOB. She has left flank pain that is alleviated with standing and worsened with laying down. She was prescribed morphine by Dr. Coletta Memos that she started on yesterday. She has taken 2 morphine pills since yesterday without constipation or nausea. She has a good appetite. She has no change in her sense of taste. She denied decreased appetite. She is still currently working. She denies unusual headaches, visual changes, nausea, vomiting, or dizziness. There has been no unusual cough, phlegm production, or pleurisy. This been no change in bowel or bladder habits. She denies unexplained fatigue or unexplained weight loss, bleeding, rash, or fever. A detailed review of systems was otherwise stable.    PAST MEDICAL HISTORY: Past Medical History:  Diagnosis Date  .  Allergy    seasonal  . Arthritis   . Breast cancer of upper-outer quadrant of right female breast ([place]) 12/16/2015  . Depression    mild depression after death of daughter- no meds  . History of  radiation therapy 06/20/17-08/04/16   right breast 50.4 gy in 28 fractions, boost 10 Gy in 5 fractions  . Hot flashes   . Hx of adenomatous colonic polyps 04/25/2014  . Hypertension     PAST SURGICAL HISTORY: Past Surgical History:  Procedure Laterality Date  . ABDOMINAL HYSTERECTOMY    . BREAST LUMPECTOMY WITH RADIOACTIVE SEED AND SENTINEL LYMPH NODE BIOPSY Right 05/05/2016   Procedure: BREAST LUMPECTOMY WITH RADIOACTIVE SEED AND SENTINEL LYMPH NODE BIOPSY;  Surgeon: Rolm Bookbinder, MD;  Location: [facility];  Service: General;  Laterality: Right;  . CHOLECYSTECTOMY N/A 09/09/2015   Procedure: LAPAROSCOPIC CHOLECYSTECTOMY;  Surgeon: Ralene Ok, MD;  Location: [facility];  Service: General;  Laterality: N/A;  . COLONOSCOPY    . KNEE ARTHROSCOPY  2016  . PERIPHERAL VASCULAR CATHETERIZATION Right 05/05/2016   Procedure: PORTA CATH REMOVAL;  Surgeon: Rolm Bookbinder, MD;  Location: [facility];  Service: General;  Laterality: Right;  . PORTACATH PLACEMENT Right 12/29/2015   Procedure: INSERTION PORT-A-CATH WITH ULTRASOUND GUIDANCE;  Surgeon: Rolm Bookbinder, MD;  Location: [facility];  Service: General;  Laterality: Right;    FAMILY HISTORY Family History  Problem Relation Age of Onset  . Hypertension Mother   . Breast cancer Maternal Grandfather   . Breast cancer Other   . Breast cancer Cousin   . Sudden death Neg Hx   . Diabetes Neg Hx   . Heart attack Neg Hx   . Hyperlipidemia Neg Hx   . Colon cancer Neg Hx   . Esophageal cancer Neg Hx   . Rectal cancer Neg Hx   . Stomach cancer Neg Hx   The patient's father was murdered at age 30. The patient's mother is 71 years old as of August 2017. The patient has one brother, 2 sisters. A cousin was diagnosed with left breast cancer at age 72. A maternal aunt and a maternal grandmother were also diagnosed with breast cancer at age 16 and 53 respectively.  GYNECOLOGIC HISTORY:  No LMP recorded. Patient has  had a hysterectomy.  Menarche age 41, first live birth age 80, the patient is GX P3. She is status post hysterectomy without salpingo-oophorectomy. She did not take hormone replacement. She used oral contraceptives remotely without complications.  SOCIAL HISTORY:  Lindsay [name] works as an Engineer, production at [employer]. Her husband Lindsay [name] is a news and record Lexicographer. Son Lindsay [name] lives in [place] and is a Games developer. Son Lindsay [name] lives in [place] and is a Architectural technologist. The patient had a daughter who died at the age of 82.    ADVANCED DIRECTIVES: Not in place   HEALTH MAINTENANCE: Social History  Substance Use Topics  . Smoking status: Never Smoker  . Smokeless tobacco: Never Used  . Alcohol use No     Colonoscopy: 2016  PAP:  Bone density: Never   Allergies  Allergen Reactions  . Percocet [Oxycodone-Acetaminophen] Diarrhea and Nausea And Vomiting  . Tramadol Nausea And Vomiting  . Vicodin [Hydrocodone-Acetaminophen] Nausea And Vomiting  . Gabapentin Other (See Comments)    "light headed, dizziness, didn't like the way it made me feel"  . Pollen Extract Other (See Comments)    Runny nose, itchy eyes and sneezing due to seasonal allergies.  Current Outpatient Prescriptions  Medication Sig Dispense Refill  . cyclobenzaprine (FLEXERIL) 5 MG tablet Take 1 tablet (5 mg total) by mouth 3 (three) times daily as needed (muscle tightness). (Patient not taking: Reported on 02/27/2017) 30 tablet 0  . EPINEPHrine (EPIPEN 2-PAK) 0.3 mg/0.3 mL IJ SOAJ injection Inject 0.3 mg into the muscle once as needed (anaphylaxis allergic reaction).     . fexofenadine (ALLEGRA) 180 MG tablet Take 180 mg by mouth daily.    Marland Kitchen ibuprofen (ADVIL,MOTRIN) 800 MG tablet Take 1 tablet (800 mg total) by mouth 3 (three) times daily. 21 tablet 0  . lisinopril-hydrochlorothiazide (PRINZIDE,ZESTORETIC) 20-12.5 MG per tablet Take 1 tablet by mouth daily.    . meloxicam (MOBIC) 15 MG tablet Take 1  tablet by mouth as needed.  0  . methylPREDNISolone (MEDROL DOSEPAK) 4 MG TBPK tablet Take as directed on package. (Patient not taking: Reported on 02/27/2017) 1 tablet 0  . predniSONE (DELTASONE) 20 MG tablet 3 tabs po day one, then 2 tabs daily x 4 days (Patient not taking: Reported on 02/27/2017) 11 tablet 0  . Triamcinolone Acetonide (NASACORT ALLERGY 24HR NA) Place 1-2 sprays into the nose daily as needed (allergies).    . venlafaxine XR (EFFEXOR-XR) 75 MG 24 hr capsule Take 1 capsule (75 mg total) by mouth daily with breakfast. 90 capsule 4   No current facility-administered medications for this visit.     OBJECTIVE: Middle-aged African-American woman  Vitals:   03/03/17 0811  BP: (!) 154/86  Pulse: 94  Resp: 18  Temp: (!) 97.5 F (36.4 C)  SpO2: 100%     Body mass index is 31.65 kg/m.    ECOG FS:1 - Symptomatic but completely ambulatory  Sclerae unicteric, EOMs intact Oropharynx clear and moist No cervical or supraclavicular adenopathy Lungs no rales or rhonchi Heart regular rate and rhythm Abd soft, nontender, positive bowel sounds MSK no focal spinal tenderness, no upper extremity lymphedema Neuro: nonfocal, well oriented, appropriate affect Breasts: The right breast has undergone lumpectomy followed by radiation.  There is no evidence of local recurrence.  The left breast is benign.  Both axilla are benign.  LAB RESULTS:  CMP     Component Value Date/Time   NA 141 12/13/2016 1103   K 3.8 12/13/2016 1103   CL 100 (L) 04/09/2016 1310   CO2 30 (H) 12/13/2016 1103   GLUCOSE 78 12/13/2016 1103   BUN 14.5 12/13/2016 1103   CREATININE 0.8 12/13/2016 1103   CALCIUM 10.4 12/13/2016 1103   PROT 7.4 12/13/2016 1103   ALBUMIN 3.9 12/13/2016 1103   AST 33 12/13/2016 1103   ALT 28 12/13/2016 1103   ALKPHOS 49 12/13/2016 1103   BILITOT 0.77 12/13/2016 1103   GFRNONAA >60 04/09/2016 1310   GFRAA >60 04/09/2016 1310    INo results found for: SPEP, UPEP  Lab Results    Component Value Date   WBC 2.6 (L) 12/13/2016   NEUTROABS 1.4 (L) 12/13/2016   HGB 12.3 12/13/2016   HCT 37.8 12/13/2016   MCV 84.9 12/13/2016   PLT 248 12/13/2016      Chemistry      Component Value Date/Time   NA 141 12/13/2016 1103   K 3.8 12/13/2016 1103   CL 100 (L) 04/09/2016 1310   CO2 30 (H) 12/13/2016 1103   BUN 14.5 12/13/2016 1103   CREATININE 0.8 12/13/2016 1103      Component Value Date/Time   CALCIUM 10.4 12/13/2016 1103   ALKPHOS 49 12/13/2016 1103  AST 33 12/13/2016 1103   ALT 28 12/13/2016 1103   BILITOT 0.77 12/13/2016 1103       No results found for: LABCA2  No components found for: LABCA125  No results for input(s): INR in the last 168 hours.  Urinalysis    Component Value Date/Time   COLORURINE YELLOW 04/09/2016 1455   APPEARANCEUR CLOUDY (A) 04/09/2016 1455   LABSPEC 1.011 04/09/2016 1455   PHURINE 7.0 04/09/2016 1455   GLUCOSEU NEGATIVE 04/09/2016 1455   HGBUR NEGATIVE 04/09/2016 1455   BILIRUBINUR NEGATIVE 04/09/2016 1455   KETONESUR NEGATIVE 04/09/2016 1455   PROTEINUR NEGATIVE 04/09/2016 1455   UROBILINOGEN 1.0 03/03/2015 1240   NITRITE NEGATIVE 04/09/2016 1455   LEUKOCYTESUR NEGATIVE 04/09/2016 1455     STUDIES: Dg Lumbar Spine Complete  Result Date: 02/13/2017 CLINICAL DATA:  Low back pain and LEFT greater than RIGHT hip pain for 4 weeks, no known injury EXAM: LUMBAR SPINE - COMPLETE 4+ VIEW COMPARISON:  None FINDINGS: Five non-rib-bearing lumbar vertebra. Osseous mineralization normal for technique. Vertebral body heights maintained. Slight disc space narrowing at T11-T12 and T12-L1 with tiny endplate spurs. No acute fracture, subluxation or bone destruction. No spondylolysis. SI joints preserved. IMPRESSION: Mild degenerative disc disease changes at thoracolumbar junction. No acute bony abnormalities. Electronically Signed   By: Lavonia Dana M.D.   On: 02/13/2017 22:46   Dg Hips Bilat W Or Wo Pelvis 3-4 Views  Result Date:  02/13/2017 CLINICAL DATA:  Low back pain and LEFT greater than RIGHT hip pain for 4 weeks, no known injury EXAM: DG HIP (WITH OR WITHOUT PELVIS) 3-4V BILAT COMPARISON:  None FINDINGS: Osseous mineralization normal for technique. Hip and SI joint spaces preserved. No acute fracture, dislocation, or bone destruction. Scattered pelvic phleboliths. IMPRESSION: No acute osseous abnormalities. Electronically Signed   By: Lavonia Dana M.D.   On: 02/13/2017 22:45   Result Narrative  CLINICAL DATA:Left lower quadrant abdominal pain. Abnormal ultrasound showing a small left renal lesion.  EXAM: MRI ABDOMEN WITHOUT AND WITH CONTRAST  TECHNIQUE: Multiplanar multisequence MR imaging of the abdomen was performed both before and after the administration of intravenous contrast.  CONTRAST:18 cc MultiHance  COMPARISON:CT scan 08/14/2015  FINDINGS: Lower chest: Multiple pulmonary nodules are noted and suspicious for metastatic disease. No pulmonary lesions seen on prior chest CT from 2017.  Hepatobiliary: Multiple hepatic lesions consistent with metastatic disease. Index lesion began segment 4A on series 2, image 8 measures 14 x 15 mm. Index lesion and segment 2, series 2 image number 8 measures 15 x 14 mm. 8 x 8 mm lesion, series 2, image 8 in segment 7 measures 8 x 8 mm. Several other hepatic metastatic lesions are demonstrated.  No intrahepatic biliary dilatation.No common bile duct dilatation.  Pancreas:No mass, inflammation or ductal dilatation.  Spleen: Chronic central scarring change in the spleen with capsular retraction. No definite splenic lesions otherwise.  Adrenals/Urinary Tract:The adrenal glands are normal.  No worrisome renal lesions.Simple appearing renal cysts.  Stomach/Bowel: The stomach, duodenum, visualized small bowel and visualize colon are unremarkable.  Vascular/Lymphatic: The aorta and branch vessels are normal. The major venous structures are patent.  No mesenteric or retroperitoneal mass or adenopathy.  Other: There is an enhancing left-sided paraspinal mass located at the L3-4 level. This measures 5.2 x 3.3 cm. It could be a primary lesion or metastatic focus. It was not present on the prior CT scan from 2017.  Musculoskeletal: Diffuse osseous metastatic disease.  IMPRESSION: 1. New diffuse metastatic disease since  prior CT scans from 2017. This is likely metastatic breast cancer given the patient's history of breast cancer. There are metastatic pulmonary lesions, metastatic hepatic lesions, diffuse bone metastasis and a left paraspinal mass at L3-4. 2. No worrisome renal lesions.Simple appearing renal cysts.   Electronically Signed ByMarijo Sanes M.D. On: 03/01/2017 10:15  Other Result Information  Interface, Rad Results In - 03/01/2017 10:17 AM EDT CLINICAL DATA:  Left lower quadrant abdominal pain. Abnormal ultrasound showing a small left renal lesion.  EXAM: MRI ABDOMEN WITHOUT AND WITH CONTRAST  TECHNIQUE: Multiplanar multisequence MR imaging of the abdomen was performed both before and after the administration of intravenous contrast.  CONTRAST:  18 cc MultiHance  COMPARISON:  CT scan 08/14/2015  FINDINGS: Lower chest: Multiple pulmonary nodules are noted and suspicious for metastatic disease. No pulmonary lesions seen on prior chest CT from 2017.  Hepatobiliary: Multiple hepatic lesions consistent with metastatic disease. Index lesion began segment 4A on series 2, image 8 measures 14 x 15 mm. Index lesion and segment 2, series 2 image number 8 measures 15 x 14 mm. 8 x 8 mm lesion, series 2, image 8 in segment 7 measures 8 x 8 mm. Several other hepatic metastatic lesions are demonstrated.  No intrahepatic biliary dilatation.  No common bile duct dilatation.  Pancreas:  No mass, inflammation or ductal dilatation.  Spleen: Chronic central scarring change in the spleen with capsular retraction.  No definite splenic lesions otherwise.  Adrenals/Urinary Tract:  The adrenal glands are normal.  No worrisome renal lesions.  Simple appearing renal cysts.  Stomach/Bowel: The stomach, duodenum, visualized small bowel and visualize colon are unremarkable.  Vascular/Lymphatic: The aorta and branch vessels are normal. The major venous structures are patent. No mesenteric or retroperitoneal mass or adenopathy.  Other: There is an enhancing left-sided paraspinal mass located at the L3-4 level. This measures 5.2 x 3.3 cm. It could be a primary lesion or metastatic focus. It was not present on the prior CT scan from 2017.  Musculoskeletal: Diffuse osseous metastatic disease.  IMPRESSION: 1. New diffuse metastatic disease since prior CT scans from 2017. This is likely metastatic breast cancer given the patient's history of breast cancer. There are metastatic pulmonary lesions, metastatic hepatic lesions, diffuse bone metastasis and a left paraspinal mass at L3-4. 2. No worrisome renal lesions.  Simple appearing renal cysts.   Electronically Signed   By: Marijo Sanes M.D.   On: 03/01/2017 10:15  Status Results Details    ELIGIBLE FOR AVAILABLE RESEARCH PROTOCOL: considered the PREVENT trial: decided against it  ASSESSMENT: 51 y.o. [place] woman status post right breast upper outer quadrant biopsy 12/14/2015 for a clinical T1c N0, stage IA  invasive ductal carcinoma, grade 3, triple negative, with an MIB-1 of 70%  (a) this stages as IIB In the 2018 new Prognostic classification  (1) genetics testing negative for mutations within any of 20 genes on the Breast/Ovarian Cancer Panel through [employer].  Additionally, no VUS were found.   (2) neoadjuvant chemotherapy consisting of doxorubicin and cyclophosphamide in dose this fashion 4, completed 02/22/2016, followed by paclitaxel weekly 5 (of 12 treatments planned), starting 03/08/2016, stopped 04/05/2016 after cycle 5  because of neuropathy  (a) cycle 4 of cyclophosphamide and doxorubicin held 1 week because of side effects  (3) status post right lumpectomy and sentinel lymph node sampling 05/05/2016 for a residual ypT2 ypN) invasive ductal carcinoma, grade 3, the repeat prognostic panel again triple negative the residual cancer burden was 2  (4)  adjuvant radiation 06/20/16 - 08/04/16 : 50.4 Gy to the right breast plus a 10 Gy boost  (5) left thyroid nodule biopsy 04/14/2016 read as [facility] 1  METASTATIC DISEASE: October 2018 (6) MRI of the abdomen March 01, 2017 shows multiple lesions in the lungs, liver, and bones  (7) to start capecitabine March 10, 2017  (8) to start denosumab/Xgeva March 10, 2017   PLAN: I spent approximately an hour today with Saidah and her husband going over her new situation.  I gave her a copy of the MRI which shows involvement of the lungs, liver, and bones.  We do need to do a CT scan of the brain to make sure we do not have metastases there as if we did that would change the treatment plan.  She understands that stage IV breast cancer is not curable with our current knowledge base. The goal of treatment is control. The strategy of treatment is to do only the minimum necessary to control the growth of the tumor so that the patient can have as normal a life as possible. There is no survival advantage in treating aggressively if treating less aggressively results in tumor control. With this strategy stage IV breast cancer in many cases can function as a "chronic illness": something that cannot be quite gotten rid of but can be controlled for an indefinite period of time  There are always 3 questions to go over and so we reviewed those today. The first question is do we treat or not. In some cases the patient's overall situation is so discouraging that palliative/comfort care alone is appropriate. If the decision is made to treat, then the next question is whether anti-estrogens  or chemotherapy is [name] appropriate. Once that decision is made than the third question is: Which agent or combination of agents in particular should be used?  Maytal clearly qualifies for treatment.  We are going to try keep Cytovene as initial therapy.  She will receive standard doses 14 days on 7 days off and we will repeat these 21-day cycles 4 times at which time we will restage.  If there is evidence of disease progression we will consider Abraxane plus a PD-L1 inhibitor.  I am setting her up for biopsy of the liver lesion in case the tumor has changed and is now estrogen receptor positive or HER-2 amplified.  I have also asked her to meet with her dentist to make sure if there are any extractions they get done before she starts that he did not some lab.  I have scheduled her to return to see me in 1 week.  Hopefully we will have all the details at that time and we should start treatment then  In the meantime she is on morphine for her pain.  She runs out she will let us know to give her a refill.  At this point she denies constipation nausea or confusion from that medication     Panfilo Ketchum, Virgie Dad, MD  03/03/17 8:24 AM Medical Oncology and Hematology [facility] [place], [place] [address] Tel. [phone]    Fax. [phone]  This document serves as a record of services personally performed by Lurline Del, MD. It was created on her behalf by Steva Colder, a trained medical scribe. The creation of this record is based on the scribe's personal observations and the provider's statements to them. This document has been checked and approved by the attending provider.

## 2017-03-03 ENCOUNTER — Other Ambulatory Visit: Payer: BLUE CROSS/BLUE SHIELD

## 2017-03-03 ENCOUNTER — Ambulatory Visit (HOSPITAL_BASED_OUTPATIENT_CLINIC_OR_DEPARTMENT_OTHER): Payer: BLUE CROSS/BLUE SHIELD | Admitting: Oncology

## 2017-03-03 ENCOUNTER — Telehealth: Payer: Self-pay | Admitting: Pharmacy Technician

## 2017-03-03 VITALS — BP 154/86 | HR 94 | Temp 97.5°F | Resp 18 | Ht 66.0 in | Wt 196.1 lb

## 2017-03-03 DIAGNOSIS — Z171 Estrogen receptor negative status [ER-]: Secondary | ICD-10-CM

## 2017-03-03 DIAGNOSIS — C50411 Malignant neoplasm of upper-outer quadrant of right female breast: Secondary | ICD-10-CM

## 2017-03-03 DIAGNOSIS — C50919 Malignant neoplasm of unspecified site of unspecified female breast: Secondary | ICD-10-CM

## 2017-03-03 DIAGNOSIS — C787 Secondary malignant neoplasm of liver and intrahepatic bile duct: Secondary | ICD-10-CM | POA: Diagnosis not present

## 2017-03-03 DIAGNOSIS — C7951 Secondary malignant neoplasm of bone: Secondary | ICD-10-CM | POA: Diagnosis not present

## 2017-03-03 DIAGNOSIS — C7800 Secondary malignant neoplasm of unspecified lung: Secondary | ICD-10-CM | POA: Insufficient documentation

## 2017-03-03 MED ORDER — CAPECITABINE 500 MG PO TABS: 1500.0000 mg | ORAL_TABLET | Freq: Two times a day (BID) | ORAL | 6 refills | Status: DC

## 2017-03-03 NOTE — Telephone Encounter (Signed)
Oral Oncology Patient Advocate Encounter  Received notification from [place] that prior authorization for Xeloda is required.  PA submitted on CoverMyMeds Key [id number] Status is pending  Oral Oncology Clinic will continue to follow.  Lindsay [name]. Melynda Keller, [name] Patient [place] [phone] 03/03/2017 2:19 PM

## 2017-03-06 ENCOUNTER — Telehealth: Payer: Self-pay | Admitting: Pharmacist

## 2017-03-06 DIAGNOSIS — C50411 Malignant neoplasm of upper-outer quadrant of right female breast: Secondary | ICD-10-CM

## 2017-03-06 DIAGNOSIS — Z171 Estrogen receptor negative status [ER-]: Principal | ICD-10-CM

## 2017-03-06 MED ORDER — CAPECITABINE 500 MG PO TABS: 1500.0000 mg | ORAL_TABLET | Freq: Two times a day (BID) | ORAL | 6 refills | Status: DC

## 2017-03-06 NOTE — Telephone Encounter (Signed)
Oral Oncology Pharmacist Encounter  Received new prescription for Xeloda (capecitabine) for the treatment of metastatic, triple-negative breast cancer, planned duration 4 cycles before restaging.  Labs from 12/13/16 assessed, OK for treatemnt. Labs will be repeated on 03/16/17  Current medication list in Epic reviewed, no significant DDIs with Xeloda identified.  Prescription has been e-scribed to [employer] [phone]) for benefits analysis and approval per insurance requirement. Prior authorization has been submitted.  I LVM for patient with information that prescription would be processed through [facility] (provided phone number), that insurance authorization was still pending, and offer for initial counseling.  Oral Oncology Clinic will continue to follow for insurance authorization, copayment issues, initial counseling and start date.  Johny Drilling, PharmD, BCPS, BCOP 03/06/2017 2:33 PM Oral Oncology Clinic [phone]

## 2017-03-07 NOTE — Telephone Encounter (Signed)
Oral Oncology Patient Advocate Encounter  Received call from [facility] requesting additional supporting information be sent to continue the processing of the patient's prior authorization for Xeloda.    Chart Notes and Treatment Plan faxed to [facility] at [phone].   Fabio Asa. Melynda Keller, [place] Patient [place] [phone] 03/07/2017 12:36 PM

## 2017-03-08 ENCOUNTER — Telehealth: Payer: Self-pay

## 2017-03-08 ENCOUNTER — Encounter: Payer: Self-pay | Admitting: Oncology

## 2017-03-08 ENCOUNTER — Other Ambulatory Visit: Payer: Self-pay | Admitting: Oncology

## 2017-03-08 MED ORDER — HYDROMORPHONE HCL 2 MG PO TABS: 2.0000 mg | ORAL_TABLET | ORAL | 0 refills | Status: DC | PRN

## 2017-03-08 NOTE — Telephone Encounter (Signed)
Returned husbands call regarding concern over pt's pain.  He had sent a MyChart msg earlier today.  No answer on number we have listed for husband.  Call placed to pt and no answer.  VM left asking pt to call back to let us know how she's doing.

## 2017-03-08 NOTE — Telephone Encounter (Signed)
Dr Jana Hakim agrees to take over narcotic management for patient with stage IV cancer pain.  Prescription for hydromorphone is ready for pt pick up

## 2017-03-09 ENCOUNTER — Telehealth: Payer: Self-pay | Admitting: Oncology

## 2017-03-09 NOTE — Telephone Encounter (Signed)
Scheduled appt per 10/30 los - patient is aware of appt 11/2

## 2017-03-09 NOTE — Progress Notes (Signed)
[place]  [phone] [phone]) [phone]     ID: Lindsay [name] DOB: Jun 27, 1965  MR#: [id number]  BZJ#:[id number]  Patient Care Team: Bernerd Limbo, MD as PCP - General (Family Medicine) Rolm Bookbinder, MD as Consulting Physician (General Surgery) Leanndra Pember, Virgie Dad, MD as Consulting Physician (Oncology) Gery Pray, MD as Consulting Physician (Radiation Oncology) Newton Pigg, MD as Consulting Physician (Obstetrics and Gynecology) Dorna Leitz, MD as Consulting Physician (Orthopedic Surgery) Delice Bison, Charlestine Massed, NP as Nurse Practitioner (Hematology and Oncology) OTHER MD:  CHIEF COMPLAINT: Triple negative stage IV breast cancer  CURRENT TREATMENT: capecitabine, denosumab/Xgeva   BREAST CANCER HISTORY: From the original intake note:  Lindsay [name] had routine screening mammography with tomography at [facility] 12/04/2015. There was a new mass measuring 1.5 cm at the 11:00 position of the right breast. Right breast ultrasonography 12/10/2015 confirmed a 1.9 cm irregular mass in the upper-outer quadrant of the right breast posteriorly. There was a right axillary lymph node with focal cortical thickening.  On 12/14/2015 Lindsay [name] underwent biopsy of the right breast mass at 9:00 position as well as a suspicious lymph node. A separate area in the right breast 10 cm from the nipple was also biopsied biopsied. The final pathology ([id number]) showed the additional area and the lymph node to be benign. The 9:00 mass however was positive for invasive ductal carcinoma, grade 3, estrogen receptor and progesterone receptor negative, with an MIB-1 of 70%, and HER-2 nonamplified with a signals ratio 1.44, and the number per cell 2.70.  Her subsequent history is as detailed below  INTERVAL HISTORY: Lindsay [name] returns today for follow-up of her now metastatic triple negative breast cancer. She is accompanied by her husband. She is starting Capecitabine and had her first dose  this past Tuesday, 03/14/2017. She is tolerating it well so far.  She also received her first Xgeva dose on March 10, 2017.  She had some bony aches after that, but those have resolved.   REVIEW OF SYSTEMS: Lindsay [name] reports all she is doing is sleeping. Her pain has been better and she has been taking ibuprofen and dilaudid every 4 hours. Her pain is currently under control. She notes that only when the medication starts to wear off she will experience pain. She is not having normal bowel movements and reports that they are loose. She is having them every other day. She experienced nausea yesterday, but no emesis. She denies unusual headaches, visual changes, vomiting, or dizziness. There has been no unusual cough, phlegm production, or pleurisy. This been no change in bladder habits. She denies unexplained weight loss, bleeding, rash, or fever. A detailed review of systems was otherwise entirely stable.    PAST MEDICAL HISTORY: Past Medical History:  Diagnosis Date  . Allergy    seasonal  . Arthritis   . Breast cancer of upper-outer quadrant of right female breast ([place]) 12/16/2015  . Depression    mild depression after death of daughter- no meds  . History of radiation therapy 06/20/17-08/04/16   right breast 50.4 gy in 28 fractions, boost 10 Gy in 5 fractions  . Hot flashes   . Hx of adenomatous colonic polyps 04/25/2014  . Hypertension     PAST SURGICAL HISTORY: Past Surgical History:  Procedure Laterality Date  . ABDOMINAL HYSTERECTOMY    . COLONOSCOPY    . KNEE ARTHROSCOPY  2016    FAMILY HISTORY Family History  Problem Relation Age of Onset  . Hypertension Mother   . Breast cancer Maternal Grandfather   .  Breast cancer Other   . Breast cancer Cousin   . Sudden death Neg Hx   . Diabetes Neg Hx   . Heart attack Neg Hx   . Hyperlipidemia Neg Hx   . Colon cancer Neg Hx   . Esophageal cancer Neg Hx   . Rectal cancer Neg Hx   . Stomach cancer Neg Hx   The patient's father  was murdered at age 57. The patient's mother is 22 years old as of August 2017. The patient has one brother, 2 sisters. A cousin was diagnosed with left breast cancer at age 22. A maternal aunt and a maternal grandmother were also diagnosed with breast cancer at age 1 and 63 respectively.  GYNECOLOGIC HISTORY:  No LMP recorded. Patient has had a hysterectomy.  Menarche age 64, first live birth age 19, the patient is GX P3. She is status post hysterectomy without salpingo-oophorectomy. She did not take hormone replacement. She used oral contraceptives remotely without complications.  SOCIAL HISTORY:  Lindsay [name] works as an Engineer, production at [employer]. Her husband Lindsay [name]  is a news and record Lexicographer. Son Lindsay [name] lives in [place] and is a Games developer. Son Lindsay [name] more lives in [place] and is a Architectural technologist. The patient had a daughter who died at the age of 58.    ADVANCED DIRECTIVES: Not in place   HEALTH MAINTENANCE: Social History   Tobacco Use  . Smoking status: Never Smoker  . Smokeless tobacco: Never Used  Substance Use Topics  . Alcohol use: No    Alcohol/week: 0.0 oz  . Drug use: No     Colonoscopy: 2016  PAP:  Bone density: Never   Allergies  Allergen Reactions  . Percocet [Oxycodone-Acetaminophen] Diarrhea and Nausea And Vomiting  . Tramadol Nausea And Vomiting  . Vicodin [Hydrocodone-Acetaminophen] Nausea And Vomiting  . Gabapentin Other (See Comments)    "light headed, dizziness, didn't like the way it made me feel"  . Pollen Extract Other (See Comments)    Runny nose, itchy eyes and sneezing due to seasonal allergies.    Current Outpatient Medications  Medication Sig Dispense Refill  . capecitabine (XELODA) 500 MG tablet Take 3 tablets (1,500 mg total) by mouth 2 (two) times daily after a meal. Take for 14 days on, 7 days off, every 21 days 84 tablet 6  . EPINEPHrine (EPIPEN 2-PAK) 0.3 mg/0.3 mL IJ SOAJ injection Inject 0.3 mg into the muscle once  as needed (anaphylaxis allergic reaction).     . fexofenadine (ALLEGRA) 180 MG tablet Take 180 mg by mouth daily.    Marland Kitchen HYDROmorphone (DILAUDID) 2 MG tablet Take 1-2 tablets (2-4 mg total) by mouth every 4 (four) hours as needed for severe pain. 120 tablet 0  . ibuprofen (ADVIL,MOTRIN) 800 MG tablet Take 1 tablet (800 mg total) 3 (three) times daily by mouth. 21 tablet 0  . lisinopril-hydrochlorothiazide (PRINZIDE,ZESTORETIC) 20-12.5 MG per tablet Take 1 tablet by mouth daily.    . metaxalone (SKELAXIN) 800 MG tablet Take 1 tablet (800 mg total) 3 (three) times daily for 10 days by mouth. 30 tablet 0  . Triamcinolone Acetonide (NASACORT ALLERGY 24HR NA) Place 1-2 sprays into the nose daily as needed (allergies).    . venlafaxine XR (EFFEXOR-XR) 75 MG 24 hr capsule Take 1 capsule (75 mg total) by mouth daily with breakfast. 90 capsule 4  . ALPRAZolam (XANAX) 0.5 MG tablet Take 0.5 mg at bedtime as needed by mouth for anxiety.     No  current facility-administered medications for this visit.     OBJECTIVE: Middle-aged African-American woman who appears sleepy Vitals:   03/16/17 0949  BP: 108/68  Pulse: 81  Resp: 18  Temp: 98.6 F (37 C)  SpO2: 100%     Body mass index is 30.6 kg/m.    ECOG FS:2 - Symptomatic, <50% confined to bed  Sclerae unicteric, pupils round and equal Oropharynx clear and moist No cervical or supraclavicular adenopathy Lungs no rales or rhonchi Heart regular rate and rhythm Abd soft, nontender, positive bowel sounds MSK no focal spinal tenderness, no upper extremity lymphedema Neuro: nonfocal, well oriented, appropriate affect Breasts: The right breast is status post lumpectomy and radiation.  There is some residual hyperpigmentation and some coarsening of the skin but no evidence of local recurrence.  Left breast is benign.  Both axillae are benign.  LAB RESULTS:  CMP     Component Value Date/Time   NA 137 03/16/2017 0917   K 3.9 03/16/2017 0917   CL 100  (L) 04/09/2016 1310   CO2 25 03/16/2017 0917   GLUCOSE 100 03/16/2017 0917   BUN 15.1 03/16/2017 0917   CREATININE 1.0 03/16/2017 0917   CALCIUM 9.0 03/16/2017 0917   PROT 8.3 03/16/2017 0917   ALBUMIN 3.9 03/16/2017 0917   AST 115 (H) 03/16/2017 0917   ALT 79 (H) 03/16/2017 0917   ALKPHOS 54 03/16/2017 0917   BILITOT 0.59 03/16/2017 0917   GFRNONAA >60 04/09/2016 1310   GFRAA >60 04/09/2016 1310    INo results found for: SPEP, UPEP  Lab Results  Component Value Date   WBC 3.5 (L) 03/16/2017   NEUTROABS 2.6 03/16/2017   HGB 11.5 (L) 03/16/2017   HCT 35.8 03/16/2017   MCV 86.3 03/16/2017   PLT 203 03/16/2017      Chemistry      Component Value Date/Time   NA 137 03/16/2017 0917   K 3.9 03/16/2017 0917   CL 100 (L) 04/09/2016 1310   CO2 25 03/16/2017 0917   BUN 15.1 03/16/2017 0917   CREATININE 1.0 03/16/2017 0917      Component Value Date/Time   CALCIUM 9.0 03/16/2017 0917   ALKPHOS 54 03/16/2017 0917   AST 115 (H) 03/16/2017 0917   ALT 79 (H) 03/16/2017 0917   BILITOT 0.59 03/16/2017 0917       No results found for: LABCA2  No components found for: [id number]  No results for input(s): INR in the last 168 hours.  Urinalysis    Component Value Date/Time   COLORURINE YELLOW 04/09/2016 1455   APPEARANCEUR CLOUDY (A) 04/09/2016 1455   LABSPEC 1.011 04/09/2016 1455   PHURINE 7.0 04/09/2016 1455   GLUCOSEU NEGATIVE 04/09/2016 1455   HGBUR NEGATIVE 04/09/2016 1455   BILIRUBINUR NEGATIVE 04/09/2016 1455   KETONESUR NEGATIVE 04/09/2016 1455   PROTEINUR NEGATIVE 04/09/2016 1455   UROBILINOGEN 1.0 03/03/2015 1240   NITRITE NEGATIVE 04/09/2016 1455   LEUKOCYTESUR NEGATIVE 04/09/2016 1455     STUDIES: Dg Femur Min 2 Views Left  Result Date: 03/13/2017 CLINICAL DATA:  Mid left femur pain for 1.5 months. No known injury. EXAM: LEFT FEMUR 2 VIEWS COMPARISON:  None. FINDINGS: No fracture or dislocation. No focal lesion. Small osteophytes are seen about the left  knee. The left hip appears normal. IMPRESSION: No acute or focal abnormality. Mild appearing left knee osteoarthritis. Electronically Signed   By: Inge Rise M.D.   On: 03/13/2017 14:06     ELIGIBLE FOR AVAILABLE RESEARCH PROTOCOL: considered the  PREVENT trial: decided against it  ASSESSMENT: 51 y.o. [place] woman status post right breast upper outer quadrant biopsy 12/14/2015 for a clinical T1c N0, stage IA  invasive ductal carcinoma, grade 3, triple negative, with an MIB-1 of 70%  (a) this stages as IIB In the 2018 new Prognostic classification  (1) genetics testing negative for mutations within any of 20 genes on the Breast/Ovarian Cancer Panel through [facility].  Additionally, no VUS were found.   (2) neoadjuvant chemotherapy consisting of doxorubicin and cyclophosphamide in dose this fashion 4, completed 02/22/2016, followed by paclitaxel weekly 5 (of 12 treatments planned), starting 03/08/2016, stopped 04/05/2016 after cycle 5 because of neuropathy  (a) cycle 4 of cyclophosphamide and doxorubicin held 1 week because of side effects  (3) status post right lumpectomy and sentinel lymph node sampling 05/05/2016 for a residual ypT2 ypN) invasive ductal carcinoma, grade 3, the repeat prognostic panel again triple negative the residual cancer burden was 2  (4) adjuvant radiation 06/20/16 - 08/04/16 : 50.4 Gy to the right breast plus a 10 Gy boost  (5) left thyroid nodule biopsy 04/14/2016 read as [facility] 1  METASTATIC DISEASE: October 2018 (6) MRI of the abdomen March 01, 2017 shows multiple lesions in the lungs, liver, and bones  (7) started capecitabine March 14, 2017; plan is to take 14 days on, 7 days off  (8) started denosumab/Xgeva March 10, 2017, to be repeated every 28 days   PLAN: Lindsay [name] is finally on keep Cytovene.  So far she is tolerating it well.  She was alerted again to the possibility of mouth sores, diarrhea, and other side effects.  She will let  us know if any of those problems develop.  We reviewed her pain medicine.  Before she was not taking enough and now she is taking a little bit too much and this is making her sleepy.  I suggested she try to lengthen the time between the hydromorphone doses, from every 4 hours to every 5-6 hours if possible.  She should have some project that she wants to accomplish every day and she should time her medication so it helps her do that instead of knocking her out  She is managing the bowel movement issue fairly well and has bowel movements every other day.  I also have asked her to make sure every Friday that she has enough medication for the weekend or as we will not be able to refill her narcotics over the weekend  She will see me again the last Thursday in November.  At that point we will set her up for her second cycle of capecitabine  She will be restaged after 4 cycles.  We are also requesting a PDL 1 test on her most recent biopsy.  Aviona Martenson, Virgie Dad, MD  03/16/17 10:11 AM Medical Oncology and Hematology [facility] [address] [place], [place] [address] Tel. [phone]    Fax. [phone]  This document serves as a record of services personally performed by Chauncey Cruel, MD. It was created on his behalf by Margit Banda, a trained medical scribe. The creation of this record is based on the scribe's personal observations and the provider's statements to them.   I have reviewed the above documentation for accuracy and completeness, and I agree with the above.

## 2017-03-09 NOTE — Progress Notes (Signed)
[place]  [phone] Fax:([phone]) [phone]     ID: Lindsay [name] DOB: 10/22/1965  MR#: [id number]  CSN#:[id number]  Patient Care Team: Bernerd Limbo, MD as PCP - General (Family Medicine) Rolm Bookbinder, MD as Consulting Physician (General Surgery) Anupama Piehl, Virgie Dad, MD as Consulting Physician (Oncology) Gery Pray, MD as Consulting Physician (Radiation Oncology) Newton Pigg, MD as Consulting Physician (Obstetrics and Gynecology) Dorna Leitz, MD as Consulting Physician (Orthopedic Surgery) Delice Bison, Charlestine Massed, NP as Nurse Practitioner (Hematology and Oncology)   CHIEF COMPLAINT: Triple negative stage IV breast cancer  CURRENT TREATMENT: To start capecitabine  BREAST CANCER HISTORY: From the original intake note:  Lindsay [name] had routine screening mammography with tomography at [facility] 12/04/2015. There was a new mass measuring 1.5 cm at the 11:00 position of the right breast. Right breast ultrasonography 12/10/2015 confirmed a 1.9 cm irregular mass in the upper-outer quadrant of the right breast posteriorly. There was a right axillary lymph node with focal cortical thickening.  On 12/14/2015 Lindsay [name] underwent biopsy of the right breast mass at 9:00 position as well as a suspicious lymph node. A separate area in the right breast 10 cm from the nipple was also biopsied biopsied. The final pathology ([id number]) showed the additional area and the lymph node to be benign. The 9:00 mass however was positive for invasive ductal carcinoma, grade 3, estrogen receptor and progesterone receptor negative, with an MIB-1 of 70%, and HER-2 nonamplified with a signals ratio 1.44, and the number per cell 2.70.  Her subsequent history is as detailed below.   INTERVAL HISTORY: Lindsay [name] returns today for follow-up and treatment of her now metastatic triple negative breast cancer. She is accompanied by her husband.  Since her last visit to the office she complained of  left upper quadrant discomfort and has had an abdominal ultrasound at [facility] on 02/15/2017 showing mild fatty liver, normal spleen, a 0.9 cm left renal lesion unchanged from prior scan April 2017, and no findings to explain her left upper quadrant pain.  She also had a MR Abdomen on 03/01/2017 with results showing multiple liver lesions, the largest measuring 1.5 cm, and others as measured on this study.  There was no biliary ductal dilatation and the pancreas was unremarkable.  Other findings included a left paraspinal mass at the L3-4 level measuring 5.2 cm, not seen on the 2017 CT scan, and multiple lung lesions bilaterally.  She is scheduled for a CT-guided biopsy of 1 1 of her liver lesions November 6.  Today she is scheduled for her first dose of denosumab/Xgeva, and to start keep Cytovene.   REVIEW OF SYSTEMS: Lindsay [name] is doing well. She reports left thigh pain that goes to just below her knee. Her pain will occasionally radiate to her back. She also endorses upper right chest wall pain. She is taking Dilaudid 2 mg with mild relief. She notes taking 2-3 tablets throughout the day, taking one pill at a time. She takes one before bed, along with Mobic 15 mg. Pt states that the medication will only last a few hours before waking her up in the middle of the night. Her pain is worse at night. Pt is barely able to ambulate due to her discomfort. Her last bowel movement was a couple of days ago. She hasn't tried anything to help alleviate her symptoms. She has been advised to get MiraLAX. She denies unusual headaches, visual changes, nausea, vomiting, or dizziness. There has been no unusual cough, phlegm production, or pleurisy.  This been no change in bowel or bladder habits. She denies unexplained fatigue or unexplained weight loss, bleeding, rash, or fever. A detailed review of systems was otherwise entirely stable.    PAST MEDICAL HISTORY: Past Medical History:  Diagnosis Date  . Allergy     seasonal  . Arthritis   . Breast cancer of upper-outer quadrant of right female breast ([place]) 12/16/2015  . Depression    mild depression after death of daughter- no meds  . History of radiation therapy 06/20/17-08/04/16   right breast 50.4 gy in 28 fractions, boost 10 Gy in 5 fractions  . Hot flashes   . Hx of adenomatous colonic polyps 04/25/2014  . Hypertension     PAST SURGICAL HISTORY: Past Surgical History:  Procedure Laterality Date  . ABDOMINAL HYSTERECTOMY    . BREAST LUMPECTOMY WITH RADIOACTIVE SEED AND SENTINEL LYMPH NODE BIOPSY Right 05/05/2016   Procedure: BREAST LUMPECTOMY WITH RADIOACTIVE SEED AND SENTINEL LYMPH NODE BIOPSY;  Surgeon: Rolm Bookbinder, MD;  Location: [facility];  Service: General;  Laterality: Right;  . CHOLECYSTECTOMY N/A 09/09/2015   Procedure: LAPAROSCOPIC CHOLECYSTECTOMY;  Surgeon: Ralene Ok, MD;  Location: [facility];  Service: General;  Laterality: N/A;  . COLONOSCOPY    . KNEE ARTHROSCOPY  2016  . PERIPHERAL VASCULAR CATHETERIZATION Right 05/05/2016   Procedure: PORTA CATH REMOVAL;  Surgeon: Rolm Bookbinder, MD;  Location: [facility];  Service: General;  Laterality: Right;  . PORTACATH PLACEMENT Right 12/29/2015   Procedure: INSERTION PORT-A-CATH WITH ULTRASOUND GUIDANCE;  Surgeon: Rolm Bookbinder, MD;  Location: [facility];  Service: General;  Laterality: Right;    FAMILY HISTORY Family History  Problem Relation Age of Onset  . Hypertension Mother   . Breast cancer Maternal Grandfather   . Breast cancer Other   . Breast cancer Cousin   . Sudden death Neg Hx   . Diabetes Neg Hx   . Heart attack Neg Hx   . Hyperlipidemia Neg Hx   . Colon cancer Neg Hx   . Esophageal cancer Neg Hx   . Rectal cancer Neg Hx   . Stomach cancer Neg Hx   The patient's father was murdered at age 43. The patient's mother is 17 years old as of August 2017. The patient has one brother, 2 sisters. A cousin was diagnosed with left breast  cancer at age 37. A maternal aunt and a maternal grandmother were also diagnosed with breast cancer at age 78 and 61 respectively.  GYNECOLOGIC HISTORY:  No LMP recorded. Patient has had a hysterectomy.  Menarche age 4, first live birth age 38, the patient is GX P3. She is status post hysterectomy without salpingo-oophorectomy. She did not take hormone replacement. She used oral contraceptives remotely without complications.  SOCIAL HISTORY:  Lindsay [name] works as an Engineer, production at [employer]. Her husband [name] Peak is a news and record Lexicographer. Son, [name] lives in [place] and is a Games developer. Son, [name] lives in [place] and is a Architectural technologist. The patient had a daughter who died at the age of 14.    ADVANCED DIRECTIVES: Not in place   HEALTH MAINTENANCE: Social History  Substance Use Topics  . Smoking status: Never Smoker  . Smokeless tobacco: Never Used  . Alcohol use No     Colonoscopy: 2016  PAP:  Bone density: Never   Allergies  Allergen Reactions  . Percocet [Oxycodone-Acetaminophen] Diarrhea and Nausea And Vomiting  . Tramadol Nausea And Vomiting  . Vicodin [  Hydrocodone-Acetaminophen] Nausea And Vomiting  . Gabapentin Other (See Comments)    "light headed, dizziness, didn't like the way it made me feel"  . Pollen Extract Other (See Comments)    Runny nose, itchy eyes and sneezing due to seasonal allergies.    Current Outpatient Prescriptions  Medication Sig Dispense Refill  . capecitabine (XELODA) 500 MG tablet Take 3 tablets (1,500 mg total) by mouth 2 (two) times daily after a meal. Take for 14 days on, 7 days off, every 21 days 84 tablet 6  . cyclobenzaprine (FLEXERIL) 5 MG tablet Take 1 tablet (5 mg total) by mouth 3 (three) times daily as needed (muscle tightness). (Patient not taking: Reported on 02/27/2017) 30 tablet 0  . EPINEPHrine (EPIPEN 2-PAK) 0.3 mg/0.3 mL IJ SOAJ injection Inject 0.3 mg into the muscle once as needed (anaphylaxis  allergic reaction).     . fexofenadine (ALLEGRA) 180 MG tablet Take 180 mg by mouth daily.    Marland Kitchen HYDROmorphone (DILAUDID) 2 MG tablet Take 1-2 tablets (2-4 mg total) by mouth every 4 (four) hours as needed for severe pain. 120 tablet 0  . ibuprofen (ADVIL,MOTRIN) 800 MG tablet Take 1 tablet (800 mg total) by mouth 3 (three) times daily. 21 tablet 0  . lisinopril-hydrochlorothiazide (PRINZIDE,ZESTORETIC) 20-12.5 MG per tablet Take 1 tablet by mouth daily.    . meloxicam (MOBIC) 15 MG tablet Take 1 tablet by mouth as needed.  0  . methylPREDNISolone (MEDROL DOSEPAK) 4 MG TBPK tablet Take as directed on package. (Patient not taking: Reported on 02/27/2017) 1 tablet 0  . predniSONE (DELTASONE) 20 MG tablet 3 tabs po day one, then 2 tabs daily x 4 days (Patient not taking: Reported on 02/27/2017) 11 tablet 0  . Triamcinolone Acetonide (NASACORT ALLERGY 24HR NA) Place 1-2 sprays into the nose daily as needed (allergies).    . venlafaxine XR (EFFEXOR-XR) 75 MG 24 hr capsule Take 1 capsule (75 mg total) by mouth daily with breakfast. 90 capsule 4   No current facility-administered medications for this visit.     OBJECTIVE: Middle-aged African-American woman who appears stated age  52:   03/10/17 0823  BP: 120/66  Pulse: 86  Resp: 20  Temp: 97.9 F (36.6 C)  SpO2: 100%     Body mass index is 31.52 kg/m.    ECOG FS:1 - Symptomatic but completely ambulatory  Sclerae unicteric, pupils round and equal Oropharynx clear and moist No cervical or supraclavicular adenopathy Lungs no rales or rhonchi Heart regular rate and rhythm Abd soft, nontender, positive bowel sounds MSK no focal spinal tenderness, no upper extremity lymphedema; straight leg raise bilaterally to about 35 degrees Neuro: nonfocal, normal dorsiflexion bilaterally, well oriented, appropriate affect Breasts: Deferred  LAB RESULTS:  CMP     Component Value Date/Time   NA 141 12/13/2016 1103   K 3.8 12/13/2016 1103   CL 100  (L) 04/09/2016 1310   CO2 30 (H) 12/13/2016 1103   GLUCOSE 78 12/13/2016 1103   BUN 14.5 12/13/2016 1103   CREATININE 0.8 12/13/2016 1103   CALCIUM 10.4 12/13/2016 1103   PROT 7.4 12/13/2016 1103   ALBUMIN 3.9 12/13/2016 1103   AST 33 12/13/2016 1103   ALT 28 12/13/2016 1103   ALKPHOS 49 12/13/2016 1103   BILITOT 0.77 12/13/2016 1103   GFRNONAA >60 04/09/2016 1310   GFRAA >60 04/09/2016 1310    INo results found for: SPEP, UPEP  Lab Results  Component Value Date   WBC 3.0 (L) 03/10/2017  NEUTROABS 1.8 03/10/2017   HGB 10.9 (L) 03/10/2017   HCT 34.3 (L) 03/10/2017   MCV 86.6 03/10/2017   PLT 178 03/10/2017      Chemistry      Component Value Date/Time   NA 141 12/13/2016 1103   K 3.8 12/13/2016 1103   CL 100 (L) 04/09/2016 1310   CO2 30 (H) 12/13/2016 1103   BUN 14.5 12/13/2016 1103   CREATININE 0.8 12/13/2016 1103      Component Value Date/Time   CALCIUM 10.4 12/13/2016 1103   ALKPHOS 49 12/13/2016 1103   AST 33 12/13/2016 1103   ALT 28 12/13/2016 1103   BILITOT 0.77 12/13/2016 1103       No results found for: LABCA2  No components found for: LABCA125  No results for input(s): INR in the last 168 hours.  Urinalysis    Component Value Date/Time   COLORURINE YELLOW 04/09/2016 1455   APPEARANCEUR CLOUDY (A) 04/09/2016 1455   LABSPEC 1.011 04/09/2016 1455   PHURINE 7.0 04/09/2016 1455   GLUCOSEU NEGATIVE 04/09/2016 1455   HGBUR NEGATIVE 04/09/2016 1455   BILIRUBINUR NEGATIVE 04/09/2016 1455   KETONESUR NEGATIVE 04/09/2016 1455   PROTEINUR NEGATIVE 04/09/2016 1455   UROBILINOGEN 1.0 03/03/2015 1240   NITRITE NEGATIVE 04/09/2016 1455   LEUKOCYTESUR NEGATIVE 04/09/2016 1455     STUDIES: Dg Lumbar Spine Complete  Result Date: 02/13/2017 CLINICAL DATA:  Low back pain and LEFT greater than RIGHT hip pain for 4 weeks, no known injury EXAM: LUMBAR SPINE - COMPLETE 4+ VIEW COMPARISON:  None FINDINGS: Five non-rib-bearing lumbar vertebra. Osseous  mineralization normal for technique. Vertebral body heights maintained. Slight disc space narrowing at T11-T12 and T12-L1 with tiny endplate spurs. No acute fracture, subluxation or bone destruction. No spondylolysis. SI joints preserved. IMPRESSION: Mild degenerative disc disease changes at thoracolumbar junction. No acute bony abnormalities. Electronically Signed   By: Lavonia Dana M.D.   On: 02/13/2017 22:46   Dg Hips Bilat W Or Wo Pelvis 3-4 Views  Result Date: 02/13/2017 CLINICAL DATA:  Low back pain and LEFT greater than RIGHT hip pain for 4 weeks, no known injury EXAM: DG HIP (WITH OR WITHOUT PELVIS) 3-4V BILAT COMPARISON:  None FINDINGS: Osseous mineralization normal for technique. Hip and SI joint spaces preserved. No acute fracture, dislocation, or bone destruction. Scattered pelvic phleboliths. IMPRESSION: No acute osseous abnormalities. Electronically Signed   By: Lavonia Dana M.D.   On: 02/13/2017 22:45    ELIGIBLE FOR AVAILABLE RESEARCH PROTOCOL: considered the PREVENT trial: decided against it  ASSESSMENT: 51 y.o. [place] woman status post right breast upper outer quadrant biopsy 12/14/2015 for a clinical T1c N0, stage IA  invasive ductal carcinoma, grade 3, triple negative, with an MIB-1 of 70%  (a) this stages as IIB In the 2018 new Prognostic classification  (1) genetics testing negative for mutations within any of 20 genes on the Breast/Ovarian Cancer Panel through [facility] [employer] [facility].  Additionally, no VUS were found.   (2) neoadjuvant chemotherapy consisting of doxorubicin and cyclophosphamide in dose this fashion 4, completed 02/22/2016, followed by paclitaxel weekly 5 (of 12 treatments planned), starting 03/08/2016, stopped 04/05/2016 after cycle 5 because of neuropathy  (a) cycle 4 of cyclophosphamide and doxorubicin held 1 week because of side effects  (3) status post right lumpectomy and sentinel lymph node sampling 05/05/2016 for a residual ypT2 ypN) invasive ductal  carcinoma, grade 3, the repeat prognostic panel again triple negative the residual cancer burden was 2  (4) adjuvant radiation 06/20/16 -  08/04/16 : 50.4 Gy to the right breast plus a 10 Gy boost  (5) left thyroid nodule biopsy 04/14/2016 read as [facility] 1  METASTATIC DISEASE: October 2018 (6) MRI of the abdomen March 01, 2017 shows multiple lesions in the lungs, liver, and bones  (7) to start capecitabine as soon as the drug is obtained  (8) to start denosumab/Xgeva March 10, 2017   PLAN: I spent approximately 45 minutes with Lindsay [name] and her husband with most of that time spent discussing her complex problems.  First we reviewed her metastatic disease workup.  We do not have a biopsy yet.  This is scheduled for next week.  We will of course send that for routine prognostic panel including HER-2, but we will also request a PD-L1 on it to see if she qualifies for treatment with a checkpoint inhibitor.  She has not yet been approved for keep Cytovene.  I was hoping to be able to start that today.  Hopefully we can start that March 13, 2017.  I have alerted our oral chemotherapy pharmacist to help [place] with that  The patient brought FMLA papers 8 days ago and they are not yet ready.  We will try to get those done for her.  Today I did write her a note explaining that she is not able to work and is not expected to get back to work.  We obtain plain films of her pelvis which do not show a significant lesion explaining her left leg pain.  I am putting her down for a bone scan which hopefully will be done within the next few days.  She might need radiation to the left femoral area.  We then reviewed how to take the keep Cytovene, including possible side effects toxicities and constipation.  She will take 3 tablets twice daily for 14 days.  She was strongly counseled to stop at day 14 and I will see her around that time just to make sure she does not continue  We also reviewed pain management.   Currently she is taking Dilaudid 2 mg 3 times a day and is doing fairly well with that.  She is not on a bowel regimen program that we had discussed that.  I have asked her today to get some stool softeners and to start MiraLAX daily  Finally she will receive her first denosumab treatment today.  I have asked her to take some Tums today to avoid hypocalcemia problems.  She understands she may have some additional bone pain in the next few days from the shot.  She will take more pain medicine as needed to deal with that  She is going to return to see me November 16.  She should be completing her first cycle of keep Cytovene by then and we should have the results of her biopsy to discuss also at that time  Chauncey Cruel, MD  03/10/17 8:36 AM Medical Oncology and Hematology [facility] [place], [place] [address] Tel. [phone]    Fax. [phone]  This document serves as a record of services personally performed by Lurline Del, MD. It was created on his behalf by Margit Banda, a trained medical scribe. The creation of this record is based on the scribe's personal observations and the provider's statements to them. This document has been checked and approved by the attending provider.

## 2017-03-09 NOTE — Telephone Encounter (Signed)
Oral Oncology Patient Advocate Encounter  Prior Authorization for Xeloda has been approved.    Effective dates: 03/03/2017 through 03/02/2018  Oral Oncology Clinic will continue to follow.   Lindsay [name]. Melynda Keller, [place] Patient [place] [phone] 03/09/2017 3:24 PM

## 2017-03-10 ENCOUNTER — Ambulatory Visit (HOSPITAL_BASED_OUTPATIENT_CLINIC_OR_DEPARTMENT_OTHER): Payer: BLUE CROSS/BLUE SHIELD | Admitting: Oncology

## 2017-03-10 ENCOUNTER — Ambulatory Visit (HOSPITAL_BASED_OUTPATIENT_CLINIC_OR_DEPARTMENT_OTHER): Payer: BLUE CROSS/BLUE SHIELD

## 2017-03-10 ENCOUNTER — Other Ambulatory Visit (HOSPITAL_BASED_OUTPATIENT_CLINIC_OR_DEPARTMENT_OTHER): Payer: BLUE CROSS/BLUE SHIELD

## 2017-03-10 VITALS — BP 120/66 | HR 86 | Temp 97.9°F | Resp 20 | Ht 66.0 in | Wt 195.3 lb

## 2017-03-10 DIAGNOSIS — C50411 Malignant neoplasm of upper-outer quadrant of right female breast: Secondary | ICD-10-CM

## 2017-03-10 DIAGNOSIS — Z171 Estrogen receptor negative status [ER-]: Principal | ICD-10-CM

## 2017-03-10 DIAGNOSIS — C7800 Secondary malignant neoplasm of unspecified lung: Secondary | ICD-10-CM

## 2017-03-10 DIAGNOSIS — C7951 Secondary malignant neoplasm of bone: Secondary | ICD-10-CM

## 2017-03-10 DIAGNOSIS — C787 Secondary malignant neoplasm of liver and intrahepatic bile duct: Secondary | ICD-10-CM

## 2017-03-10 DIAGNOSIS — C50911 Malignant neoplasm of unspecified site of right female breast: Secondary | ICD-10-CM

## 2017-03-10 DIAGNOSIS — C50919 Malignant neoplasm of unspecified site of unspecified female breast: Secondary | ICD-10-CM

## 2017-03-10 LAB — COMPREHENSIVE METABOLIC PANEL
ALBUMIN: 3.9 g/dL (ref 3.5–5.0)
ALK PHOS: 42 U/L (ref 40–150)
ALT: 46 U/L (ref 0–55)
ANION GAP: 11 meq/L (ref 3–11)
AST: 75 U/L — ABNORMAL HIGH (ref 5–34)
BILIRUBIN TOTAL: 0.81 mg/dL (ref 0.20–1.20)
BUN: 14.5 mg/dL (ref 7.0–26.0)
CALCIUM: 10.9 mg/dL — AB (ref 8.4–10.4)
CO2: 31 mEq/L — ABNORMAL HIGH (ref 22–29)
Chloride: 97 mEq/L — ABNORMAL LOW (ref 98–109)
Creatinine: 0.9 mg/dL (ref 0.6–1.1)
Glucose: 81 mg/dl (ref 70–140)
Potassium: 3.6 mEq/L (ref 3.5–5.1)
Sodium: 139 mEq/L (ref 136–145)
TOTAL PROTEIN: 7.8 g/dL (ref 6.4–8.3)

## 2017-03-10 LAB — CBC WITH DIFFERENTIAL/PLATELET
BASO%: 0.3 % (ref 0.0–2.0)
Basophils Absolute: 0 10*3/uL (ref 0.0–0.1)
EOS%: 1.3 % (ref 0.0–7.0)
Eosinophils Absolute: 0 10*3/uL (ref 0.0–0.5)
HCT: 34.3 % — ABNORMAL LOW (ref 34.8–46.6)
HEMOGLOBIN: 10.9 g/dL — AB (ref 11.6–15.9)
LYMPH#: 0.9 10*3/uL (ref 0.9–3.3)
LYMPH%: 29.3 % (ref 14.0–49.7)
MCH: 27.5 pg (ref 25.1–34.0)
MCHC: 31.8 g/dL (ref 31.5–36.0)
MCV: 86.6 fL (ref 79.5–101.0)
MONO#: 0.3 10*3/uL (ref 0.1–0.9)
MONO%: 8.3 % (ref 0.0–14.0)
NEUT%: 60.8 % (ref 38.4–76.8)
NEUTROS ABS: 1.8 10*3/uL (ref 1.5–6.5)
PLATELETS: 178 10*3/uL (ref 145–400)
RBC: 3.96 10*6/uL (ref 3.70–5.45)
RDW: 13 % (ref 11.2–14.5)
WBC: 3 10*3/uL — AB (ref 3.9–10.3)

## 2017-03-10 MED ORDER — DENOSUMAB 120 MG/1.7ML ~~LOC~~ SOLN
120.0000 mg | Freq: Once | SUBCUTANEOUS | Status: AC
  Administered 2017-03-10: 120 mg via SUBCUTANEOUS
  Filled 2017-03-10: qty 1.7

## 2017-03-11 LAB — CANCER ANTIGEN 27.29: CAN 27.29: 18.4 U/mL (ref 0.0–38.6)

## 2017-03-12 ENCOUNTER — Encounter: Payer: Self-pay | Admitting: Oncology

## 2017-03-13 ENCOUNTER — Other Ambulatory Visit: Payer: Self-pay | Admitting: *Deleted

## 2017-03-13 ENCOUNTER — Ambulatory Visit (HOSPITAL_COMMUNITY)
Admission: RE | Admit: 2017-03-13 | Discharge: 2017-03-13 | Disposition: A | Discharge status: Home / Self Care | Payer: BLUE CROSS/BLUE SHIELD | Source: Ambulatory Visit | Attending: Medical | Admitting: Medical

## 2017-03-13 ENCOUNTER — Ambulatory Visit (HOSPITAL_BASED_OUTPATIENT_CLINIC_OR_DEPARTMENT_OTHER): Payer: BLUE CROSS/BLUE SHIELD | Admitting: Medical

## 2017-03-13 ENCOUNTER — Telehealth: Payer: Self-pay | Admitting: *Deleted

## 2017-03-13 ENCOUNTER — Other Ambulatory Visit: Payer: Self-pay | Admitting: Student

## 2017-03-13 VITALS — BP 115/72 | HR 97 | Temp 98.3°F | Resp 20 | Ht 66.0 in | Wt 196.3 lb

## 2017-03-13 DIAGNOSIS — M79605 Pain in left leg: Secondary | ICD-10-CM | POA: Insufficient documentation

## 2017-03-13 DIAGNOSIS — M1712 Unilateral primary osteoarthritis, left knee: Secondary | ICD-10-CM | POA: Diagnosis not present

## 2017-03-13 DIAGNOSIS — M62838 Other muscle spasm: Secondary | ICD-10-CM | POA: Diagnosis not present

## 2017-03-13 DIAGNOSIS — C50411 Malignant neoplasm of upper-outer quadrant of right female breast: Secondary | ICD-10-CM | POA: Diagnosis not present

## 2017-03-13 MED ORDER — IBUPROFEN 800 MG PO TABS: 800.0000 mg | ORAL_TABLET | Freq: Three times a day (TID) | ORAL | 0 refills | Status: DC

## 2017-03-13 MED ORDER — METAXALONE 800 MG PO TABS: 800.0000 mg | ORAL_TABLET | Freq: Three times a day (TID) | ORAL | 0 refills | Status: DC

## 2017-03-13 NOTE — Telephone Encounter (Signed)
This RN spoke with pt per her email stating she would prefer not to do the liver biopsy due to uncontrolled pain at present.  Per above MD states biopsy may be cancelled at this time - he will see her as scheduled tomorrow to answer questions further.  Per conversation with this RN - Lindsay [name] states she is using the hydromorphone ( 2 tablets ) every four hours, mobic " and pill Dr Jana Hakim gave me for sleep " ( pt did not have meds with her  that she could state the name of this med ).  The pain is interfering with her sleep and ADL's - if she takes enough medication she just falls asleep - but wakes in less than 4 hours with pain.  Per discussion - and due to uncontrolled pain- pt will come in to symptom management for above issues.  URGENT LOS sent to scheduling-  Pt can be here at 1130 for appointment -

## 2017-03-13 NOTE — Progress Notes (Signed)
Transported to Radiology via w/c. Husband in attendance

## 2017-03-13 NOTE — Progress Notes (Signed)
[facility] Progress Note   Lindsay [name] [id number] 07-26-1965 51 y.o.  [name] is managed by Dr. Tressa Busman   Actively treated with chemotherapy: Pending start of chemotherapy   Assessment: Plan:    Leg pain, anterior, left - Plan: DG FEMUR MIN 2 VIEWS LEFT, ibuprofen (ADVIL,MOTRIN) 800 MG tablet, DISCONTINUED: ibuprofen (ADVIL,MOTRIN) 800 MG tablet  Muscle spasm of left lower extremity - Plan: metaxalone (SKELAXIN) 800 MG tablet   Left anterior leg pain with muscle spasms of the left anterior lower extremity: Patient was referred for a plain x-ray of her left femur.  She was told to stop Mobic and begin Motrin 800 mg p.o. 3 times daily times 7 days.  Additionally she was given a prescription for Skelaxin 800 mg p.o. 3 times daily times 10 days.  Metastatic triple negative breast cancer: The patient is awaiting receipt of capecitabine.  She will return for her appointment with Dr. Gunnar Bulla Magrinat on 03/16/2017.  Please see After Visit Summary for patient specific instructions.  Future Appointments  Date Time Provider [place]  03/16/2017  9:30 AM [facility] 4 [facility] None  03/16/2017 10:00 AM Magrinat, Virgie Dad, MD [facility] None    Orders Placed This Encounter  Procedures  . DG FEMUR MIN 2 VIEWS LEFT       Subjective:   Patient ID:  Lindsay [name] is a 51 y.o. (DOB 02/24/1966) female.  Chief Complaint:  Chief Complaint  Patient presents with  . Pain    HPI Lindsay [name] is a 51 year old female with a metastatic triple negative breast cancer who is pending start of capecitabine. She was last seen by Dr. Jana Hakim on 03/10/2017.  She is scheduled to see him again on 03/16/2017.  She presents to the office today with left anterior thigh and left buttock pain.  She was seen at the emergency room at [facility] on 02/27/2012 for this.  She was prescribed ice and heat, Flexeril, Mobic, and a Medrol Dosepak.  Despite this, she continues  to have pain as noted above.  She has had this pain since mid September.  She continues to use Dilaudid.  She reports having ongoing stable constipation with her use of Dilaudid.  Her last bowel movement was Saturday.  Medications: I have reviewed the patient's current medications.  Allergies:  Allergies  Allergen Reactions  . Percocet [Oxycodone-Acetaminophen] Diarrhea and Nausea And Vomiting  . Tramadol Nausea And Vomiting  . Vicodin [Hydrocodone-Acetaminophen] Nausea And Vomiting  . Gabapentin Other (See Comments)    "light headed, dizziness, didn't like the way it made me feel"  . Pollen Extract Other (See Comments)    Runny nose, itchy eyes and sneezing due to seasonal allergies.    Past Medical History:  Diagnosis Date  . Allergy    seasonal  . Arthritis   . Breast cancer of upper-outer quadrant of right female breast ([place]) 12/16/2015  . Depression    mild depression after death of daughter- no meds  . History of radiation therapy 06/20/17-08/04/16   right breast 50.4 gy in 28 fractions, boost 10 Gy in 5 fractions  . Hot flashes   . Hx of adenomatous colonic polyps 04/25/2014  . Hypertension     Past Surgical History:  Procedure Laterality Date  . ABDOMINAL HYSTERECTOMY    . COLONOSCOPY    . KNEE ARTHROSCOPY  2016    Family History  Problem Relation Age of Onset  . Hypertension Mother   .  Breast cancer Maternal Grandfather   . Breast cancer Other   . Breast cancer Cousin   . Sudden death Neg Hx   . Diabetes Neg Hx   . Heart attack Neg Hx   . Hyperlipidemia Neg Hx   . Colon cancer Neg Hx   . Esophageal cancer Neg Hx   . Rectal cancer Neg Hx   . Stomach cancer Neg Hx     Social History   Socioeconomic History  . Marital status: Married    Spouse name: Not on file  . Number of children: Not on file  . Years of education: Not on file  . Highest education level: Not on file  Social Needs  . Financial resource strain: Not on file  . Food insecurity -  worry: Not on file  . Food insecurity - inability: Not on file  . Transportation needs - medical: Not on file  . Transportation needs - non-medical: Not on file  Occupational History  . Not on file  Tobacco Use  . Smoking status: Never Smoker  . Smokeless tobacco: Never Used  Substance and Sexual Activity  . Alcohol use: No    Alcohol/week: 0.0 oz  . Drug use: No  . Sexual activity: Yes    Birth control/protection: Surgical  Other Topics Concern  . Not on file  Social History Narrative  . Not on file    Past Medical History, Surgical history, Social history, and Family history were reviewed and updated as appropriate.   Please see review of systems for further details on the patient's review from today.   Review of Systems:  Review of Systems  Constitutional: Negative for activity change, fever and unexpected weight change.  Cardiovascular: Positive for leg swelling.  Gastrointestinal: Positive for constipation. Negative for abdominal distention, abdominal pain and diarrhea.  Musculoskeletal: Positive for back pain and myalgias.    Objective:   Physical Exam:  BP 115/72 (BP Location: Left Arm, Patient Position: Sitting)   Pulse 97   Temp 98.3 F (36.8 C) (Oral)   Resp 20   Ht 5\' 6"  (1.676 m)   Wt 196 lb 4.8 oz (89 kg)   SpO2 100%   BMI 31.68 kg/m  ECOG: 1   Physical Exam  Constitutional: No distress.  HENT:  Head: Normocephalic and atraumatic.  Cardiovascular: Normal rate, regular rhythm and normal heart sounds. Exam reveals no gallop and no friction rub.  No murmur heard. Pulmonary/Chest: Effort normal and breath sounds normal. No respiratory distress. She has no wheezes. She has no rales.  Abdominal: Soft. Bowel sounds are normal. She exhibits no distension and no mass. There is no tenderness. There is no rebound and no guarding.  Musculoskeletal: She exhibits no edema.       Legs: Neurological: She is alert. Coordination normal.  Negative left straight  leg raise  Skin: Skin is warm and dry. She is not diaphoretic.    Lab Review:     Component Value Date/Time   NA 139 03/10/2017 0809   K 3.6 03/10/2017 0809   CL 100 (L) 04/09/2016 1310   CO2 31 (H) 03/10/2017 0809   GLUCOSE 81 03/10/2017 0809   BUN 14.5 03/10/2017 0809   CREATININE 0.9 03/10/2017 0809   CALCIUM 10.9 (H) 03/10/2017 0809   PROT 7.8 03/10/2017 0809   ALBUMIN 3.9 03/10/2017 0809   AST 75 (H) 03/10/2017 0809   ALT 46 03/10/2017 0809   ALKPHOS 42 03/10/2017 0809   BILITOT 0.81 03/10/2017 0809  GFRNONAA >60 04/09/2016 1310   GFRAA >60 04/09/2016 1310       Component Value Date/Time   WBC 3.0 (L) 03/10/2017 0808   WBC 1.3 (LL) 04/09/2016 1310   RBC 3.96 03/10/2017 0808   RBC 3.68 (L) 04/09/2016 1310   HGB 10.9 (L) 03/10/2017 0808   HCT 34.3 (L) 03/10/2017 0808   PLT 178 03/10/2017 0808   MCV 86.6 03/10/2017 0808   MCH 27.5 03/10/2017 0808   MCH 29.6 04/09/2016 1310   MCHC 31.8 03/10/2017 0808   MCHC 32.3 04/09/2016 1310   RDW 13.0 03/10/2017 0808   LYMPHSABS 0.9 03/10/2017 0808   MONOABS 0.3 03/10/2017 0808   EOSABS 0.0 03/10/2017 0808   BASOSABS 0.0 03/10/2017 0808   -------------------------------  Imaging from last 24 hours (if applicable):  Radiology interpretation: Dg Lumbar Spine Complete  Result Date: 02/13/2017 CLINICAL DATA:  Low back pain and LEFT greater than RIGHT hip pain for 4 weeks, no known injury EXAM: LUMBAR SPINE - COMPLETE 4+ VIEW COMPARISON:  None FINDINGS: Five non-rib-bearing lumbar vertebra. Osseous mineralization normal for technique. Vertebral body heights maintained. Slight disc space narrowing at T11-T12 and T12-L1 with tiny endplate spurs. No acute fracture, subluxation or bone destruction. No spondylolysis. SI joints preserved. IMPRESSION: Mild degenerative disc disease changes at thoracolumbar junction. No acute bony abnormalities. Electronically Signed   By: Lavonia Dana M.D.   On: 02/13/2017 22:46   Dg Hips Bilat W Or Wo  Pelvis 3-4 Views  Result Date: 02/13/2017 CLINICAL DATA:  Low back pain and LEFT greater than RIGHT hip pain for 4 weeks, no known injury EXAM: DG HIP (WITH OR WITHOUT PELVIS) 3-4V BILAT COMPARISON:  None FINDINGS: Osseous mineralization normal for technique. Hip and SI joint spaces preserved. No acute fracture, dislocation, or bone destruction. Scattered pelvic phleboliths. IMPRESSION: No acute osseous abnormalities. Electronically Signed   By: Lavonia Dana M.D.   On: 02/13/2017 22:45

## 2017-03-13 NOTE — Telephone Encounter (Signed)
Oral Oncology Patient Advocate Encounter  Confirmed with [facility] that patient's prescription for capecitabine has been shipped to the patient's home.  It is scheduled for delivery on 03-14-2017.   Fabio Asa. Melynda Keller, [place] Patient [place] [phone] 03/13/2017 11:53 AM

## 2017-03-13 NOTE — Progress Notes (Signed)
These results were called to Lindsay [name].  The patient did not answer.  Message was left for her outlining the results of her x-ray.

## 2017-03-14 ENCOUNTER — Ambulatory Visit (HOSPITAL_COMMUNITY): Payer: BLUE CROSS/BLUE SHIELD

## 2017-03-14 ENCOUNTER — Encounter (HOSPITAL_COMMUNITY): Payer: Self-pay

## 2017-03-14 ENCOUNTER — Encounter: Payer: Self-pay | Admitting: Oncology

## 2017-03-14 ENCOUNTER — Ambulatory Visit (HOSPITAL_COMMUNITY)
Admission: RE | Admit: 2017-03-14 | Discharge: 2017-03-14 | Disposition: A | Discharge status: Home / Self Care | Payer: BLUE CROSS/BLUE SHIELD | Source: Ambulatory Visit | Attending: Oncology | Admitting: Oncology

## 2017-03-14 ENCOUNTER — Other Ambulatory Visit: Payer: Self-pay | Admitting: Oncology

## 2017-03-14 ENCOUNTER — Telehealth: Payer: Self-pay | Admitting: *Deleted

## 2017-03-14 NOTE — Telephone Encounter (Signed)
This RN called pt per noted email sent by her husband at 458 pm today stating pt is continuing to have pain.  Per call - Lindsay [name] states she has not taken any hydromorphone today - yesterday she took 1 tab " about "  every 4 hours.  Review xray reading obtained yesterday and verified she is taking new medications- she has not taken the hydromorphone due to starting the new medications.  Due to ongoing pain not resolved she will take 2 hydromorphone ( mg ) now and understands that she can take 2 every 4 hours.  This RN requested her to call in the AM with update for further recommendations if needed.  This RN also reviewed constipation issues with [name] stating she had  2 BM's today- with her verbalizing understanding use of stool softener for benefit.

## 2017-03-16 ENCOUNTER — Other Ambulatory Visit (HOSPITAL_BASED_OUTPATIENT_CLINIC_OR_DEPARTMENT_OTHER): Payer: BLUE CROSS/BLUE SHIELD

## 2017-03-16 ENCOUNTER — Ambulatory Visit (HOSPITAL_BASED_OUTPATIENT_CLINIC_OR_DEPARTMENT_OTHER): Payer: BLUE CROSS/BLUE SHIELD | Admitting: Oncology

## 2017-03-16 VITALS — BP 108/68 | HR 81 | Temp 98.6°F | Resp 18 | Ht 66.0 in | Wt 189.6 lb

## 2017-03-16 DIAGNOSIS — C50411 Malignant neoplasm of upper-outer quadrant of right female breast: Secondary | ICD-10-CM | POA: Diagnosis not present

## 2017-03-16 DIAGNOSIS — C7951 Secondary malignant neoplasm of bone: Secondary | ICD-10-CM

## 2017-03-16 DIAGNOSIS — C787 Secondary malignant neoplasm of liver and intrahepatic bile duct: Secondary | ICD-10-CM

## 2017-03-16 DIAGNOSIS — C7800 Secondary malignant neoplasm of unspecified lung: Secondary | ICD-10-CM

## 2017-03-16 DIAGNOSIS — Z171 Estrogen receptor negative status [ER-]: Secondary | ICD-10-CM

## 2017-03-16 LAB — CBC WITH DIFFERENTIAL/PLATELET
BASO%: 0.3 % (ref 0.0–2.0)
Basophils Absolute: 0 10*3/uL (ref 0.0–0.1)
EOS%: 0.3 % (ref 0.0–7.0)
Eosinophils Absolute: 0 10*3/uL (ref 0.0–0.5)
HEMATOCRIT: 35.8 % (ref 34.8–46.6)
HEMOGLOBIN: 11.5 g/dL — AB (ref 11.6–15.9)
LYMPH#: 0.7 10*3/uL — AB (ref 0.9–3.3)
LYMPH%: 18.9 % (ref 14.0–49.7)
MCH: 27.7 pg (ref 25.1–34.0)
MCHC: 32.1 g/dL (ref 31.5–36.0)
MCV: 86.3 fL (ref 79.5–101.0)
MONO#: 0.2 10*3/uL (ref 0.1–0.9)
MONO%: 6 % (ref 0.0–14.0)
NEUT%: 74.5 % (ref 38.4–76.8)
NEUTROS ABS: 2.6 10*3/uL (ref 1.5–6.5)
PLATELETS: 203 10*3/uL (ref 145–400)
RBC: 4.15 10*6/uL (ref 3.70–5.45)
RDW: 13.3 % (ref 11.2–14.5)
WBC: 3.5 10*3/uL — AB (ref 3.9–10.3)

## 2017-03-16 LAB — COMPREHENSIVE METABOLIC PANEL
ALBUMIN: 3.9 g/dL (ref 3.5–5.0)
ALK PHOS: 54 U/L (ref 40–150)
ALT: 79 U/L — ABNORMAL HIGH (ref 0–55)
ANION GAP: 11 meq/L (ref 3–11)
AST: 115 U/L — ABNORMAL HIGH (ref 5–34)
BILIRUBIN TOTAL: 0.59 mg/dL (ref 0.20–1.20)
BUN: 15.1 mg/dL (ref 7.0–26.0)
CO2: 25 mEq/L (ref 22–29)
Calcium: 9 mg/dL (ref 8.4–10.4)
Chloride: 101 mEq/L (ref 98–109)
Creatinine: 1 mg/dL (ref 0.6–1.1)
GLUCOSE: 100 mg/dL (ref 70–140)
POTASSIUM: 3.9 meq/L (ref 3.5–5.1)
SODIUM: 137 meq/L (ref 136–145)
TOTAL PROTEIN: 8.3 g/dL (ref 6.4–8.3)

## 2017-03-16 LAB — TECHNOLOGIST REVIEW

## 2017-03-17 ENCOUNTER — Other Ambulatory Visit (HOSPITAL_COMMUNITY)
Admission: RE | Admit: 2017-03-17 | Discharge: 2017-03-17 | Disposition: A | Discharge status: Home / Self Care | Payer: BLUE CROSS/BLUE SHIELD | Source: Ambulatory Visit | Attending: Oncology | Admitting: Oncology

## 2017-03-17 ENCOUNTER — Telehealth: Payer: Self-pay | Admitting: Oncology

## 2017-03-17 ENCOUNTER — Other Ambulatory Visit: Payer: Self-pay | Admitting: Medical

## 2017-03-17 DIAGNOSIS — C50411 Malignant neoplasm of upper-outer quadrant of right female breast: Secondary | ICD-10-CM | POA: Diagnosis present

## 2017-03-17 DIAGNOSIS — M79605 Pain in left leg: Secondary | ICD-10-CM

## 2017-03-17 NOTE — Telephone Encounter (Signed)
Spoke with patient regarding appt added per 11/2 los.  °

## 2017-03-19 ENCOUNTER — Encounter: Payer: Self-pay | Admitting: Oncology

## 2017-03-20 ENCOUNTER — Encounter: Payer: Self-pay | Admitting: Oncology

## 2017-03-20 MED ORDER — IBUPROFEN 800 MG PO TABS: 800.0000 mg | ORAL_TABLET | Freq: Three times a day (TID) | ORAL | 0 refills | Status: DC

## 2017-03-21 ENCOUNTER — Telehealth: Payer: Self-pay | Admitting: *Deleted

## 2017-03-21 ENCOUNTER — Telehealth: Payer: Self-pay | Admitting: Physical Therapy

## 2017-03-21 NOTE — Telephone Encounter (Signed)
03/02/17 pt cxl PT, waiting for test results. 03/21/17 spoke with husband, she does not want to schedule PT at this time, "can barely walk"

## 2017-03-21 NOTE — Telephone Encounter (Signed)
This RN was able to reach [place] on his cell phone.  This RN attempted to discuss his concerns - but while on the phone with this RN he woke the patient up to ask her how she was doing and if she wanted to come in to the office on 03/22/2017.  Malie stated " I got it under control ", " no I can wait until Friday".  [name] [place] states " her mom came over today and she did something and Tanyla is feeling better "  [name] [place] asked this RN " what can we do about those cancer pills she needs to take ?"  This RN informed [name] [place] above is more complicated then what may be able to review on the phone.  Plan at present is this RN will follow up with them tomorrow after reviewing their concerns with MD.  Note [name] [place] states he will have both Rashi's phone and his for return call as well as Arshiya's mom will be her pt tomorrow.

## 2017-03-21 NOTE — Telephone Encounter (Signed)
This RN retrieved email per Deloris Ping - for non urgent concerns left by the patient's husband 11/11 at 9pm.  Per email Mr [place] stated Ahlayah was having side effects from the medication we are prescribing as well as her pain is still not well controlled.  This RN called pt upon retrieving the email an obtained an identified VM - message left requesting a return call in the am so we can see how to help her best.  This RN also replied to the husband's email requesting that his urgent concerns need to be called to the office due to email is set up for non urgent concerns and are not checked routinely. In email office number given to call as well as to call after hours if pt's condition is worsening. Request also put in return email stating need to call in am so we can assist and possibly see pt for concerns.  This RN will follow up 03/22/2017

## 2017-03-23 NOTE — Progress Notes (Signed)
[name]  [phone]) [phone] [phone]) [phone]     ID: Lindsay [name] DOB: May 05, 1966  MR#: [id number]  CBS#:[id number]  Patient Care Team: Bernerd Limbo, MD as PCP - General (Family Medicine) Rolm Bookbinder, MD as Consulting Physician (General Surgery) Magrinat, Virgie Dad, MD as Consulting Physician (Oncology) Gery Pray, MD as Consulting Physician (Radiation Oncology) Newton Pigg, MD as Consulting Physician (Obstetrics and Gynecology) Dorna Leitz, MD as Consulting Physician (Orthopedic Surgery) Delice Bison, Charlestine Massed, NP as Nurse Practitioner (Hematology and Oncology) OTHER MD:  CHIEF COMPLAINT: Triple negative stage IV breast cancer  CURRENT TREATMENT: CMF,  denosumab/Xgeva   BREAST CANCER HISTORY: From the original intake note:  Lindsay [name] had routine screening mammography with tomography at [facility] 12/04/2015. There was a new mass measuring 1.5 cm at the 11:00 position of the right breast. Right breast ultrasonography 12/10/2015 confirmed a 1.9 cm irregular mass in the upper-outer quadrant of the right breast posteriorly. There was a right axillary lymph node with focal cortical thickening.  On 12/14/2015 [name] underwent biopsy of the right breast mass at 9:00 position as well as a suspicious lymph node. A separate area in the right breast 10 cm from the nipple was also biopsied biopsied. The final pathology ([id number]) showed the additional area and the lymph node to be benign. The 9:00 mass however was positive for invasive ductal carcinoma, grade 3, estrogen receptor and progesterone receptor negative, with an MIB-1 of 70%, and HER-2 nonamplified with a signals ratio 1.44, and the number per cell 2.70.  Her subsequent history is as detailed below  INTERVAL HISTORY: Lindsay [name] returns today for follow-up and treatment of her triple negative breast cancer, which is now stage IV. She is accompanied by her husband today. She reports that she started taking  Xeloda 10 days ago and her husband noted that she had auditory and visual hallucinations when taking it. She d/c use of Xeloda 5 days ago. She denies taking effexor at this time. She takes dilaudid q 4-5 hours, up to 4-6 pills a day.  This does not constipated or nauseated her but it does seem to somewhat sleepy  Since her last visit to the office, she has had a left femur xray with results revealing: No acute or focal abnormality. Mild appearing left knee osteoarthritis.  REVIEW OF SYSTEMS: Lindsay [name] notes numbness to her left knee. She reports that she hasn't done anything recently due to increased fatigue. Her husband reports that she lays around all day. She reports decreased PO intake and decreased appetite. she reports weight loss. She denies constipation. She denies unusual headaches, visual changes, nausea, vomiting, or dizziness. There has been no unusual cough, phlegm production, or pleurisy. This been no change in bladder habits. She denies bleeding, rash, or fever. A detailed review of systems was otherwise stable.     PAST MEDICAL HISTORY: Past Medical History:  Diagnosis Date  . Allergy    seasonal  . Arthritis   . Breast cancer of upper-outer quadrant of right female breast ([place]) 12/16/2015  . Depression    mild depression after death of daughter- no meds  . History of radiation therapy 06/20/17-08/04/16   right breast 50.4 gy in 28 fractions, boost 10 Gy in 5 fractions  . Hot flashes   . Hx of adenomatous colonic polyps 04/25/2014  . Hypertension     PAST SURGICAL HISTORY: Past Surgical History:  Procedure Laterality Date  . ABDOMINAL HYSTERECTOMY    . BREAST LUMPECTOMY WITH RADIOACTIVE SEED AND SENTINEL LYMPH  NODE BIOPSY Right 05/05/2016   Performed by Rolm Bookbinder, [facility] at [facility]  . COLONOSCOPY    . INSERTION PORT-A-CATH WITH ULTRASOUND GUIDANCE Right 12/29/2015   Performed by Rolm Bookbinder, MD at [place]  . KNEE ARTHROSCOPY  2016  . LAPAROSCOPIC  CHOLECYSTECTOMY N/A 09/09/2015   Performed by Ralene Ok, MD at [facility]  . PORTA CATH REMOVAL Right 05/05/2016   Performed by Rolm Bookbinder, MD at [place] Family History  Problem Relation Age of Onset  . Hypertension Mother   . Breast cancer Maternal Grandfather   . Breast cancer Other   . Breast cancer Cousin   . Sudden death Neg Hx   . Diabetes Neg Hx   . Heart attack Neg Hx   . Hyperlipidemia Neg Hx   . Colon cancer Neg Hx   . Esophageal cancer Neg Hx   . Rectal cancer Neg Hx   . Stomach cancer Neg Hx   The patient's father was murdered at age 94. The patient's mother is 68 years old as of August 2017. The patient has one brother, 2 sisters. A cousin was diagnosed with left breast cancer at age 80. A maternal aunt and a maternal grandmother were also diagnosed with breast cancer at age 55 and 28 respectively.  GYNECOLOGIC HISTORY:  No LMP recorded. Patient has had a hysterectomy.  Menarche age 15, first live birth age 49, the patient is GX P3. She is status post hysterectomy without salpingo-oophorectomy. She did not take hormone replacement. She used oral contraceptives remotely without complications.  SOCIAL HISTORY:  Miko worked as an Engineer, production at [employer].  She is now applying for disability.  Her husband [name] Ruskin is a news and record Lexicographer. Son Lindsay [name] lives in [place] and is a Games developer. Son Lindsay [name] lives in [place] and is a Architectural technologist. The patient had a daughter who died at the age of 46.    ADVANCED DIRECTIVES: Not in place   HEALTH MAINTENANCE: Social History   Tobacco Use  . Smoking status: Never Smoker  . Smokeless tobacco: Never Used  Substance Use Topics  . Alcohol use: No    Alcohol/week: 0.0 oz  . Drug use: No     Colonoscopy: 2016  PAP:  Bone density: Never   Allergies  Allergen Reactions  . Percocet [Oxycodone-Acetaminophen] Diarrhea and Nausea And Vomiting  .  Tramadol Nausea And Vomiting  . Vicodin [Hydrocodone-Acetaminophen] Nausea And Vomiting  . Gabapentin Other (See Comments)    "light headed, dizziness, didn't like the way it made me feel"  . Pollen Extract Other (See Comments)    Runny nose, itchy eyes and sneezing due to seasonal allergies.    Current Outpatient Medications  Medication Sig Dispense Refill  . ALPRAZolam (XANAX) 0.5 MG tablet Take 0.5 mg at bedtime as needed by mouth for anxiety.    . capecitabine (XELODA) 500 MG tablet Take 3 tablets (1,500 mg total) by mouth 2 (two) times daily after a meal. Take for 14 days on, 7 days off, every 21 days 84 tablet 6  . EPINEPHrine (EPIPEN 2-PAK) 0.3 mg/0.3 mL IJ SOAJ injection Inject 0.3 mg into the muscle once as needed (anaphylaxis allergic reaction).     . fexofenadine (ALLEGRA) 180 MG tablet Take 180 mg by mouth daily.    Marland Kitchen HYDROmorphone (DILAUDID) 2 MG tablet Take 1-2 tablets (2-4 mg total) by mouth every 4 (four) hours as needed  for severe pain. 120 tablet 0  . ibuprofen (ADVIL,MOTRIN) 800 MG tablet Take 1 tablet (800 mg total) 3 (three) times daily by mouth. 21 tablet 0  . lisinopril-hydrochlorothiazide (PRINZIDE,ZESTORETIC) 20-12.5 MG per tablet Take 1 tablet by mouth daily.    . Triamcinolone Acetonide (NASACORT ALLERGY 24HR NA) Place 1-2 sprays into the nose daily as needed (allergies).    . venlafaxine XR (EFFEXOR-XR) 75 MG 24 hr capsule Take 1 capsule (75 mg total) by mouth daily with breakfast. 90 capsule 4   No current facility-administered medications for this visit.     OBJECTIVE: Middle-aged African-American woman   Vitals:   03/24/17 1438  BP: 124/66  Pulse: 86  Resp: 20  Temp: 98.2 F (36.8 C)  SpO2: 100%     Body mass index is 29.88 kg/m.    ECOG FS:2 - Symptomatic, <50% confined to bed  Sclerae unicteric, EOMs intact Oropharynx clear and moist No cervical or supraclavicular adenopathy Lungs no rales or rhonchi Heart regular rate and rhythm Abd soft,  nontender, positive bowel sounds MSK no focal spinal tenderness, no upper extremity lymphedema Neuro: nonfocal, well oriented, appropriate affect Breasts: Deferred  LAB RESULTS:  CMP     Component Value Date/Time   NA 139 03/24/2017 1311   K 4.0 03/24/2017 1311   CL 100 (L) 04/09/2016 1310   CO2 25 03/24/2017 1311   GLUCOSE 101 03/24/2017 1311   BUN 20.4 03/24/2017 1311   CREATININE 1.0 03/24/2017 1311   CALCIUM 8.4 03/24/2017 1311   PROT 8.1 03/24/2017 1311   ALBUMIN 3.9 03/24/2017 1311   AST 90 (H) 03/24/2017 1311   ALT 74 (H) 03/24/2017 1311   ALKPHOS 46 03/24/2017 1311   BILITOT 0.73 03/24/2017 1311   GFRNONAA >60 04/09/2016 1310   GFRAA >60 04/09/2016 1310    INo results found for: SPEP, UPEP  Lab Results  Component Value Date   WBC 4.5 03/24/2017   NEUTROABS 3.1 03/24/2017   HGB 11.9 03/24/2017   HCT 36.7 03/24/2017   MCV 84.6 03/24/2017   PLT 280 03/24/2017      Chemistry      Component Value Date/Time   NA 139 03/24/2017 1311   K 4.0 03/24/2017 1311   CL 100 (L) 04/09/2016 1310   CO2 25 03/24/2017 1311   BUN 20.4 03/24/2017 1311   CREATININE 1.0 03/24/2017 1311      Component Value Date/Time   CALCIUM 8.4 03/24/2017 1311   ALKPHOS 46 03/24/2017 1311   AST 90 (H) 03/24/2017 1311   ALT 74 (H) 03/24/2017 1311   BILITOT 0.73 03/24/2017 1311       No results found for: LABCA2  No components found for: [id number]  No results for input(s): INR in the last 168 hours.  Urinalysis    Component Value Date/Time   COLORURINE YELLOW 04/09/2016 1455   APPEARANCEUR CLOUDY (A) 04/09/2016 1455   LABSPEC 1.011 04/09/2016 1455   PHURINE 7.0 04/09/2016 1455   GLUCOSEU NEGATIVE 04/09/2016 1455   HGBUR NEGATIVE 04/09/2016 1455   BILIRUBINUR NEGATIVE 04/09/2016 1455   KETONESUR NEGATIVE 04/09/2016 1455   PROTEINUR NEGATIVE 04/09/2016 1455   UROBILINOGEN 1.0 03/03/2015 1240   NITRITE NEGATIVE 04/09/2016 1455   LEUKOCYTESUR NEGATIVE 04/09/2016 1455      STUDIES: Dg Femur Min 2 Views Left  Result Date: 03/13/2017 CLINICAL DATA:  Mid left femur pain for 1.5 months. No known injury. EXAM: LEFT FEMUR 2 VIEWS COMPARISON:  None. FINDINGS: No fracture or dislocation. No focal  lesion. Small osteophytes are seen about the left knee. The left hip appears normal. IMPRESSION: No acute or focal abnormality. Mild appearing left knee osteoarthritis. Electronically Signed   By: Inge Rise M.D.   On: 03/13/2017 14:06     ELIGIBLE FOR AVAILABLE RESEARCH PROTOCOL: considered the PREVENT trial: decided against it  ASSESSMENT: 51 y.o. [place] woman status post right breast upper outer quadrant biopsy 12/14/2015 for a clinical T1c N0, stage IA  invasive ductal carcinoma, grade 3, triple negative, with an MIB-1 of 70%  (a) this stages as IIB In the 2018 new Prognostic classification  (1) genetics testing negative for mutations within any of 20 genes on the Breast/Ovarian Cancer Panel through [facility] [employer] [facility].  Additionally, no VUS were found.   (2) neoadjuvant chemotherapy consisting of doxorubicin and cyclophosphamide in dose this fashion 4, completed 02/22/2016, followed by paclitaxel weekly 5 (of 12 treatments planned), starting 03/08/2016, stopped 04/05/2016 after cycle 5 because of neuropathy  (a) cycle 4 of cyclophosphamide and doxorubicin held 1 week because of side effects  (3) status post right lumpectomy and sentinel lymph node sampling 05/05/2016 for a residual ypT2 ypN) invasive ductal carcinoma, grade 3, the repeat prognostic panel again triple negative the residual cancer burden was 2  (4) adjuvant radiation 06/20/16 - 08/04/16 : 50.4 Gy to the right breast plus a 10 Gy boost  (5) left thyroid nodule biopsy 04/14/2016 read as [facility] 1  METASTATIC DISEASE: October 2018 (6) MRI of the abdomen March 01, 2017 shows multiple lesions in the lungs, liver, and bones  (a) the tumor is PD-L1 negative  (7) started capecitabine  March 14, 2017; plan is to take 14 days on, 7 days off  (a) discontinued 03/19/2017 with multiple side effects  (8) started denosumab/Xgeva March 10, 2017, to be repeated every 28 days  (9) pain syndrome: currently on hydromorphone 2 mg Q4h PRN  (10) to start CMF chemotherapy 04/03/2017, to be repeated every 21 days     PLAN: I spent approximately 35 minutes with Caleesi and her husband today.  I am concerned that we are still not making much headway on her pain control.  We have discussed the fact that the point of pain medicine is not to have no pain but to be able to do more.  However today has really not been able to do much of anything.  She cannot do any housework, is not doing any cooking or shopping.  The pain is moderately controlled as far as that goes and she is tolerating Dilaudid well but so far it has not resulted in improved functional status  She stopped the venlafaxine because she says she is taking too many pills.  She is no longer on alprazolam.  The side effects she had from capecitabine are unusual but not impossible.  At any rate that is not going to be the medication for her  Unfortunately her tumor proved to be PD-L1 negative so pembrolizumab is not an option  Today we discussed cyclophosphamide, methotrexate, and fluorouracil.  CMF can be very effective and can be very well tolerated.  We discussed the possible toxicities, side effects and complications at length today.  She will need a port placed, and I will see if Dr. Donne Hazel can possibly do that before the first dose which is planned for 04/03/2017  She will be restaged after 3 or 4 doses.  Her husband finally got the disability papers completed today.  Hopefully that will come through and they will have  some monetary support.  Also today I wrote them a prescription for hydromorphone 2 mg 20 tablets to keep in her purse in case they run out over a weekend.  They are not to use this prescription  otherwise.  She knows to call for any other issues that may develop before the next visit.  Magrinat, Virgie Dad, MD  03/24/17 2:59 PM Medical Oncology and Hematology [facility] [address] [place], [place] [address] Tel. [phone]    Fax. [phone]    This document serves as a record of services personally performed by Lurline Del, MD. It was created on his behalf by Steva Colder, a trained medical scribe. The creation of this record is based on the scribe's personal observations and the provider's statements to them.   I have reviewed the above documentation for accuracy and completeness, and I agree with the above.

## 2017-03-24 ENCOUNTER — Ambulatory Visit (HOSPITAL_BASED_OUTPATIENT_CLINIC_OR_DEPARTMENT_OTHER): Payer: BLUE CROSS/BLUE SHIELD | Admitting: Oncology

## 2017-03-24 ENCOUNTER — Other Ambulatory Visit (HOSPITAL_BASED_OUTPATIENT_CLINIC_OR_DEPARTMENT_OTHER): Payer: BLUE CROSS/BLUE SHIELD

## 2017-03-24 VITALS — BP 124/66 | HR 86 | Temp 98.2°F | Resp 20 | Ht 66.0 in | Wt 185.1 lb

## 2017-03-24 DIAGNOSIS — C7800 Secondary malignant neoplasm of unspecified lung: Secondary | ICD-10-CM

## 2017-03-24 DIAGNOSIS — C787 Secondary malignant neoplasm of liver and intrahepatic bile duct: Secondary | ICD-10-CM

## 2017-03-24 DIAGNOSIS — C50919 Malignant neoplasm of unspecified site of unspecified female breast: Secondary | ICD-10-CM

## 2017-03-24 DIAGNOSIS — Z171 Estrogen receptor negative status [ER-]: Secondary | ICD-10-CM

## 2017-03-24 DIAGNOSIS — C50411 Malignant neoplasm of upper-outer quadrant of right female breast: Secondary | ICD-10-CM | POA: Diagnosis not present

## 2017-03-24 DIAGNOSIS — Z7189 Other specified counseling: Secondary | ICD-10-CM

## 2017-03-24 DIAGNOSIS — C7951 Secondary malignant neoplasm of bone: Secondary | ICD-10-CM

## 2017-03-24 LAB — COMPREHENSIVE METABOLIC PANEL
ALBUMIN: 3.9 g/dL (ref 3.5–5.0)
ALK PHOS: 46 U/L (ref 40–150)
ALT: 74 U/L — AB (ref 0–55)
ANION GAP: 13 meq/L — AB (ref 3–11)
AST: 90 U/L — AB (ref 5–34)
BUN: 20.4 mg/dL (ref 7.0–26.0)
CALCIUM: 8.4 mg/dL (ref 8.4–10.4)
CO2: 25 mEq/L (ref 22–29)
CREATININE: 1 mg/dL (ref 0.6–1.1)
Chloride: 101 mEq/L (ref 98–109)
EGFR: >60 mL/min/{1.73_m2} (ref >60)
Glucose: 101 mg/dl (ref 70–140)
Potassium: 4 mEq/L (ref 3.5–5.1)
Sodium: 139 mEq/L (ref 136–145)
Total Bilirubin: 0.73 mg/dL (ref 0.20–1.20)
Total Protein: 8.1 g/dL (ref 6.4–8.3)

## 2017-03-24 LAB — CBC WITH DIFFERENTIAL/PLATELET
BASO%: 0.4 % (ref 0.0–2.0)
BASOS ABS: 0 10*3/uL (ref 0.0–0.1)
EOS%: 0.2 % (ref 0.0–7.0)
Eosinophils Absolute: 0 10*3/uL (ref 0.0–0.5)
HEMATOCRIT: 36.7 % (ref 34.8–46.6)
HEMOGLOBIN: 11.9 g/dL (ref 11.6–15.9)
LYMPH#: 1 10*3/uL (ref 0.9–3.3)
LYMPH%: 22.2 % (ref 14.0–49.7)
MCH: 27.4 pg (ref 25.1–34.0)
MCHC: 32.4 g/dL (ref 31.5–36.0)
MCV: 84.6 fL (ref 79.5–101.0)
MONO#: 0.3 10*3/uL (ref 0.1–0.9)
MONO%: 7.4 % (ref 0.0–14.0)
NEUT#: 3.1 10*3/uL (ref 1.5–6.5)
NEUT%: 69.8 % (ref 38.4–76.8)
PLATELETS: 280 10*3/uL (ref 145–400)
RBC: 4.34 10*6/uL (ref 3.70–5.45)
RDW: 13.5 % (ref 11.2–14.5)
WBC: 4.5 10*3/uL (ref 3.9–10.3)

## 2017-03-24 LAB — TECHNOLOGIST REVIEW

## 2017-03-24 MED ORDER — PROCHLORPERAZINE MALEATE 10 MG PO TABS: 10.0000 mg | ORAL_TABLET | Freq: Four times a day (QID) | ORAL | 1 refills | Status: DC | PRN

## 2017-03-24 NOTE — Progress Notes (Signed)
DISCONTINUE ON PATHWAY REGIMEN - Breast   Dose Dense AC-T q14days Part 1 (cycles 1-4):   A cycle is every 14 days:     Doxorubicin        Dose Mod: None     Cyclophosphamide        Dose Mod: None     Pegfilgrastim        Dose Mod: None  **Always confirm dose/schedule in your pharmacy ordering system**    Dose Dense AC-T q14days Part 2 (cycles 5-8):   A cycle is every 14 days:     Paclitaxel        Dose Mod: None     Pegfilgrastim        Dose Mod: None  **Always confirm dose/schedule in your pharmacy ordering system**    REASON: Toxicities / Adverse Event PRIOR TREATMENT: [id number]: Dose-Dense AC-T - Doxorubicin, Cyclophosphamide q14 Days x 4 Cycles, Followed by Paclitaxel q14 Days x 4 Cycles TREATMENT RESPONSE: Unable to Evaluate  START OFF PATHWAY REGIMEN - Breast   [id number]:CMF (IV cyclophosphamide) q21 days:   A cycle is every 21 days:     Cyclophosphamide      Methotrexate      5-Fluorouracil   **Always confirm dose/schedule in your pharmacy ordering system**    Patient Characteristics: Metastatic Chemotherapy, HER2 Negative/Unknown/Equivocal, ER Negative/Unknown, First Line Therapeutic Status: Distant Metastases BRCA Mutation Status: Did Not Order Test ER Status: Negative (-) HER2 Status: Negative (-) Would you be surprised if this patient died  in the next year<= I would NOT be surprised if this patient died in the next year PR Status: Negative (-) Line of therapy: First Line Intent of Therapy: Non-Curative / Palliative Intent, Discussed with Patient

## 2017-03-28 ENCOUNTER — Other Ambulatory Visit: Payer: Self-pay | Admitting: Oncology

## 2017-03-28 ENCOUNTER — Encounter (HOSPITAL_COMMUNITY): Payer: Self-pay

## 2017-03-28 DIAGNOSIS — M79605 Pain in left leg: Secondary | ICD-10-CM

## 2017-03-28 MED ORDER — IBUPROFEN 800 MG PO TABS: 800.0000 mg | ORAL_TABLET | Freq: Three times a day (TID) | ORAL | 0 refills | Status: DC

## 2017-03-30 ENCOUNTER — Other Ambulatory Visit: Payer: Self-pay | Admitting: General Surgery

## 2017-04-01 ENCOUNTER — Encounter: Payer: Self-pay | Admitting: Adult Health

## 2017-04-01 ENCOUNTER — Encounter: Payer: Self-pay | Admitting: Physical Therapy

## 2017-04-01 ENCOUNTER — Encounter: Payer: Self-pay | Admitting: Oncology

## 2017-04-03 ENCOUNTER — Other Ambulatory Visit: Payer: Self-pay | Admitting: Oncology

## 2017-04-03 MED ORDER — HYDROMORPHONE HCL 2 MG PO TABS: 2.0000 mg | ORAL_TABLET | ORAL | 0 refills | Status: DC | PRN

## 2017-04-04 ENCOUNTER — Other Ambulatory Visit: Payer: Self-pay | Admitting: Oncology

## 2017-04-05 ENCOUNTER — Encounter (HOSPITAL_COMMUNITY): Payer: Self-pay | Admitting: *Deleted

## 2017-04-05 ENCOUNTER — Telehealth: Payer: Self-pay | Admitting: *Deleted

## 2017-04-05 ENCOUNTER — Other Ambulatory Visit: Payer: Self-pay

## 2017-04-05 NOTE — Progress Notes (Signed)
[name] Pre-op call completed by pt spouse, Lindsay [name]. Spouse denies that pt C/O SOB and chest pain. Spouse denies that pt is under the care of a cardiologist. Spouse denies that pt had a cardiac cath but stated that a stress test was performed > 10 years ago. Spouse made aware to have pt stop taking  Aspirin, vitamins, fish oil and herbal medications. Do not take any NSAIDs ie: Ibuprofen, Advil, Naproxen (Aleve), Motrin, BC and Goody Powder or any medication containing Aspirin. Spouse verbalized understanding of all pre-op instructions.

## 2017-04-05 NOTE — Telephone Encounter (Signed)
This RN returned call to pt's RUEAVWU,JWJXBJ,YNW left VM stating pt is having uncontrolled pain.  Per phone discussion - [name] Noank states presently Lindsay [name] is asleep - " the medicine seemed to finally kick in but it was awful this morning "  This RN asked [name] Bolton for what and how pt is taking pain medications.  [name] Laurens states Danaija is taking the ibuprofen - " and then the hydromorphone when she is having pain "  This RN informed [name] Rentchler need to treat pain before it escalates - once pain is escalated it can be difficult to obtain good control.  Willie placed this RN on speaker phone so pt's mother could hear instructions- this RN reviewed use of pain scale of 1-10 - with 10 being the worst pain. Goal with [name] is to keep her pain at a 5 or less - because once her pain is greater it quickly escalates and takes longer to get under control.  Per above- they should give Lindsay [name] one of her 2 mg hydromorphone every 4 hours - then if she states her pain is worsening to give the additional 2 mg for greater control.  Discussed also concern with BM- with pt's mother verifying that Cody is having daily BM's - and understands " yes when she takes the pain pills it does make her constipated "  Discussed pain should improve after chemo helps decrease tumors in her body.  This RN offered to bring pt in today for [facility] - with [name] [place] stating " no - I think if we do what you say we can see how that helps until tomorrow when she is scheduled to come in ".

## 2017-04-06 ENCOUNTER — Ambulatory Visit (HOSPITAL_COMMUNITY): Payer: BLUE CROSS/BLUE SHIELD | Admitting: Certified Registered"

## 2017-04-06 ENCOUNTER — Ambulatory Visit: Payer: BLUE CROSS/BLUE SHIELD

## 2017-04-06 ENCOUNTER — Other Ambulatory Visit (HOSPITAL_BASED_OUTPATIENT_CLINIC_OR_DEPARTMENT_OTHER): Payer: BLUE CROSS/BLUE SHIELD

## 2017-04-06 ENCOUNTER — Encounter (HOSPITAL_COMMUNITY): Payer: Self-pay | Admitting: *Deleted

## 2017-04-06 ENCOUNTER — Other Ambulatory Visit: Payer: BLUE CROSS/BLUE SHIELD

## 2017-04-06 ENCOUNTER — Ambulatory Visit (HOSPITAL_COMMUNITY): Payer: BLUE CROSS/BLUE SHIELD

## 2017-04-06 ENCOUNTER — Ambulatory Visit (HOSPITAL_COMMUNITY)
Admission: RE | Admit: 2017-04-06 | Discharge: 2017-04-06 | Disposition: A | Discharge status: Home / Self Care | Payer: BLUE CROSS/BLUE SHIELD | Source: Ambulatory Visit | Attending: General Surgery | Admitting: General Surgery

## 2017-04-06 ENCOUNTER — Other Ambulatory Visit: Payer: Self-pay | Admitting: *Deleted

## 2017-04-06 ENCOUNTER — Ambulatory Visit (HOSPITAL_BASED_OUTPATIENT_CLINIC_OR_DEPARTMENT_OTHER): Payer: BLUE CROSS/BLUE SHIELD | Admitting: Oncology

## 2017-04-06 ENCOUNTER — Telehealth: Payer: Self-pay | Admitting: Oncology

## 2017-04-06 ENCOUNTER — Encounter (HOSPITAL_COMMUNITY)
Admission: RE | Disposition: A | Discharge status: Home / Self Care | Payer: Self-pay | Source: Ambulatory Visit | Attending: General Surgery

## 2017-04-06 ENCOUNTER — Ambulatory Visit (HOSPITAL_BASED_OUTPATIENT_CLINIC_OR_DEPARTMENT_OTHER): Payer: BLUE CROSS/BLUE SHIELD

## 2017-04-06 VITALS — BP 113/76 | HR 93 | Temp 98.4°F | Resp 20 | Ht 66.0 in

## 2017-04-06 DIAGNOSIS — Z79899 Other long term (current) drug therapy: Secondary | ICD-10-CM | POA: Insufficient documentation

## 2017-04-06 DIAGNOSIS — Z5111 Encounter for antineoplastic chemotherapy: Secondary | ICD-10-CM | POA: Diagnosis not present

## 2017-04-06 DIAGNOSIS — Z888 Allergy status to other drugs, medicaments and biological substances status: Secondary | ICD-10-CM | POA: Insufficient documentation

## 2017-04-06 DIAGNOSIS — C7951 Secondary malignant neoplasm of bone: Secondary | ICD-10-CM | POA: Diagnosis not present

## 2017-04-06 DIAGNOSIS — C787 Secondary malignant neoplasm of liver and intrahepatic bile duct: Secondary | ICD-10-CM

## 2017-04-06 DIAGNOSIS — Z171 Estrogen receptor negative status [ER-]: Secondary | ICD-10-CM

## 2017-04-06 DIAGNOSIS — C50411 Malignant neoplasm of upper-outer quadrant of right female breast: Secondary | ICD-10-CM

## 2017-04-06 DIAGNOSIS — Z86010 Personal history of colonic polyps: Secondary | ICD-10-CM | POA: Insufficient documentation

## 2017-04-06 DIAGNOSIS — Z9221 Personal history of antineoplastic chemotherapy: Secondary | ICD-10-CM | POA: Insufficient documentation

## 2017-04-06 DIAGNOSIS — C7800 Secondary malignant neoplasm of unspecified lung: Secondary | ICD-10-CM | POA: Insufficient documentation

## 2017-04-06 DIAGNOSIS — M1990 Unspecified osteoarthritis, unspecified site: Secondary | ICD-10-CM | POA: Diagnosis not present

## 2017-04-06 DIAGNOSIS — Z885 Allergy status to narcotic agent status: Secondary | ICD-10-CM | POA: Diagnosis not present

## 2017-04-06 DIAGNOSIS — Z803 Family history of malignant neoplasm of breast: Secondary | ICD-10-CM | POA: Diagnosis not present

## 2017-04-06 DIAGNOSIS — I739 Peripheral vascular disease, unspecified: Secondary | ICD-10-CM | POA: Diagnosis not present

## 2017-04-06 DIAGNOSIS — E876 Hypokalemia: Secondary | ICD-10-CM

## 2017-04-06 DIAGNOSIS — I10 Essential (primary) hypertension: Secondary | ICD-10-CM | POA: Insufficient documentation

## 2017-04-06 DIAGNOSIS — C50919 Malignant neoplasm of unspecified site of unspecified female breast: Secondary | ICD-10-CM

## 2017-04-06 DIAGNOSIS — Z95828 Presence of other vascular implants and grafts: Secondary | ICD-10-CM

## 2017-04-06 DIAGNOSIS — Z419 Encounter for procedure for purposes other than remedying health state, unspecified: Secondary | ICD-10-CM

## 2017-04-06 HISTORY — PX: PORTACATH PLACEMENT: SHX2246

## 2017-04-06 LAB — COMPREHENSIVE METABOLIC PANEL
ALBUMIN: 3.8 g/dL (ref 3.5–5.0)
ALK PHOS: 41 U/L (ref 40–150)
ALT: 105 U/L — ABNORMAL HIGH (ref 0–55)
ANION GAP: 14 meq/L — AB (ref 3–11)
AST: 164 U/L — ABNORMAL HIGH (ref 5–34)
BUN: 18 mg/dL (ref 7.0–26.0)
CO2: 24 meq/L (ref 22–29)
Calcium: 7.3 mg/dL — ABNORMAL LOW (ref 8.4–10.4)
Chloride: 102 mEq/L (ref 98–109)
Creatinine: 0.8 mg/dL (ref 0.6–1.1)
GLUCOSE: 110 mg/dL (ref 70–140)
SODIUM: 141 meq/L (ref 136–145)
Total Bilirubin: 1.03 mg/dL (ref 0.20–1.20)
Total Protein: 7.6 g/dL (ref 6.4–8.3)

## 2017-04-06 LAB — CBC WITH DIFFERENTIAL/PLATELET
BASO%: 0.3 % (ref 0.0–2.0)
Basophils Absolute: 0 10*3/uL (ref 0.0–0.1)
EOS%: 0.3 % (ref 0.0–7.0)
Eosinophils Absolute: 0 10*3/uL (ref 0.0–0.5)
HCT: 30.9 % — ABNORMAL LOW (ref 34.8–46.6)
HEMOGLOBIN: 10.1 g/dL — AB (ref 11.6–15.9)
LYMPH#: 0.8 10*3/uL — AB (ref 0.9–3.3)
LYMPH%: 21.7 % (ref 14.0–49.7)
MCH: 27.5 pg (ref 25.1–34.0)
MCHC: 32.7 g/dL (ref 31.5–36.0)
MCV: 84.2 fL (ref 79.5–101.0)
MONO#: 0.2 10*3/uL (ref 0.1–0.9)
MONO%: 5.9 % (ref 0.0–14.0)
NEUT#: 2.8 10*3/uL (ref 1.5–6.5)
NEUT%: 71.8 % (ref 38.4–76.8)
NRBC: 1 % — AB (ref 0–0)
Platelets: 139 10*3/uL — ABNORMAL LOW (ref 145–400)
RBC: 3.67 10*6/uL — ABNORMAL LOW (ref 3.70–5.45)
RDW: 14 % (ref 11.2–14.5)
WBC: 3.9 10*3/uL (ref 3.9–10.3)

## 2017-04-06 LAB — TECHNOLOGIST REVIEW

## 2017-04-06 SURGERY — INSERTION, TUNNELED CENTRAL VENOUS DEVICE, WITH PORT
Anesthesia: General | Site: Chest | Laterality: Right

## 2017-04-06 MED ORDER — DEXAMETHASONE SODIUM PHOSPHATE 10 MG/ML IJ SOLN
10.0000 mg | Freq: Once | INTRAMUSCULAR | Status: AC
  Administered 2017-04-06: 10 mg via INTRAVENOUS

## 2017-04-06 MED ORDER — HEPARIN SOD (PORK) LOCK FLUSH 100 UNIT/ML IV SOLN
INTRAVENOUS | Status: AC
  Filled 2017-04-06: qty 5

## 2017-04-06 MED ORDER — DEXAMETHASONE SODIUM PHOSPHATE 4 MG/ML IJ SOLN
INTRAMUSCULAR | Status: DC | PRN
  Administered 2017-04-06: 8 mg via INTRAVENOUS

## 2017-04-06 MED ORDER — ACETAMINOPHEN 500 MG PO TABS
1000.0000 mg | ORAL_TABLET | ORAL | Status: AC
Start: 2017-04-06 — End: 2017-04-06
  Administered 2017-04-06: 1000 mg via ORAL

## 2017-04-06 MED ORDER — PHENYLEPHRINE 40 MCG/ML (10ML) SYRINGE FOR IV PUSH (FOR BLOOD PRESSURE SUPPORT)
PREFILLED_SYRINGE | INTRAVENOUS | Status: DC | PRN
  Administered 2017-04-06: 80 ug via INTRAVENOUS
  Administered 2017-04-06: 120 ug via INTRAVENOUS
  Administered 2017-04-06 (×2): 80 ug via INTRAVENOUS

## 2017-04-06 MED ORDER — MEPERIDINE HCL 25 MG/ML IJ SOLN: 6.2500 mg | INTRAMUSCULAR | Status: DC | PRN

## 2017-04-06 MED ORDER — BUPIVACAINE HCL (PF) 0.25 % IJ SOLN
INTRAMUSCULAR | Status: AC
  Filled 2017-04-06: qty 30

## 2017-04-06 MED ORDER — POTASSIUM CHLORIDE CRYS ER 20 MEQ PO TBCR
20.0000 meq | EXTENDED_RELEASE_TABLET | Freq: Once | ORAL | Status: DC
Start: 2017-04-06 — End: 2017-04-06
  Administered 2017-04-06: 20 meq via ORAL
  Filled 2017-04-06: qty 1

## 2017-04-06 MED ORDER — LACTATED RINGERS IV SOLN
INTRAVENOUS | Status: DC | PRN
  Administered 2017-04-06: 07:00:00 via INTRAVENOUS

## 2017-04-06 MED ORDER — SODIUM CHLORIDE 0.9 % IV SOLN: Freq: Once | INTRAVENOUS | Status: AC

## 2017-04-06 MED ORDER — DEXAMETHASONE SODIUM PHOSPHATE 10 MG/ML IJ SOLN
INTRAMUSCULAR | Status: AC
  Filled 2017-04-06: qty 1

## 2017-04-06 MED ORDER — HYDROMORPHONE HCL 1 MG/ML IJ SOLN: 0.2500 mg | INTRAMUSCULAR | Status: DC | PRN

## 2017-04-06 MED ORDER — MIDAZOLAM HCL 2 MG/2ML IJ SOLN
INTRAMUSCULAR | Status: AC
  Filled 2017-04-06: qty 2

## 2017-04-06 MED ORDER — PALONOSETRON HCL INJECTION 0.25 MG/5ML
INTRAVENOUS | Status: AC
  Filled 2017-04-06: qty 5

## 2017-04-06 MED ORDER — LIDOCAINE-PRILOCAINE 2.5-2.5 % EX CREA: TOPICAL_CREAM | CUTANEOUS | 3 refills | Status: DC

## 2017-04-06 MED ORDER — SODIUM CHLORIDE 0.9 % IV SOLN
INTRAVENOUS | Status: DC | PRN
  Administered 2017-04-06: 500 mL

## 2017-04-06 MED ORDER — OXYCODONE HCL 5 MG PO TABS: 5.0000 mg | ORAL_TABLET | Freq: Four times a day (QID) | ORAL | 0 refills | Status: DC | PRN

## 2017-04-06 MED ORDER — PHENYLEPHRINE 40 MCG/ML (10ML) SYRINGE FOR IV PUSH (FOR BLOOD PRESSURE SUPPORT)
PREFILLED_SYRINGE | INTRAVENOUS | Status: AC
  Filled 2017-04-06: qty 10

## 2017-04-06 MED ORDER — LIDOCAINE 2% (20 MG/ML) 5 ML SYRINGE
INTRAMUSCULAR | Status: AC
  Filled 2017-04-06: qty 5

## 2017-04-06 MED ORDER — HEPARIN SOD (PORK) LOCK FLUSH 100 UNIT/ML IV SOLN
INTRAVENOUS | Status: DC | PRN
  Administered 2017-04-06: 500 [IU] via INTRAVENOUS

## 2017-04-06 MED ORDER — CEFAZOLIN SODIUM-DEXTROSE 2-4 GM/100ML-% IV SOLN
2.0000 g | INTRAVENOUS | Status: AC
  Administered 2017-04-06: 2 g via INTRAVENOUS

## 2017-04-06 MED ORDER — METHOTREXATE SODIUM (PF) CHEMO INJECTION 250 MG/10ML
40.4500 mg/m2 | Freq: Once | INTRAMUSCULAR | Status: AC
  Administered 2017-04-06: 80 mg via INTRAVENOUS
  Filled 2017-04-06: qty 3.2

## 2017-04-06 MED ORDER — PROPOFOL 10 MG/ML IV BOLUS
INTRAVENOUS | Status: DC | PRN
  Administered 2017-04-06: 200 mg via INTRAVENOUS

## 2017-04-06 MED ORDER — SODIUM CHLORIDE 0.9% FLUSH
10.0000 mL | INTRAVENOUS | Status: DC | PRN
  Administered 2017-04-06: 10 mL
  Filled 2017-04-06: qty 10

## 2017-04-06 MED ORDER — PROPOFOL 10 MG/ML IV BOLUS
INTRAVENOUS | Status: AC
  Filled 2017-04-06: qty 20

## 2017-04-06 MED ORDER — ONDANSETRON HCL 8 MG PO TABS: 8.0000 mg | ORAL_TABLET | Freq: Two times a day (BID) | ORAL | 1 refills | Status: DC | PRN

## 2017-04-06 MED ORDER — SODIUM CHLORIDE 0.9 % IV SOLN
INTRAVENOUS | Status: DC
  Administered 2017-04-06: 16:00:00 via INTRAVENOUS

## 2017-04-06 MED ORDER — HYDROMORPHONE HCL 2 MG PO TABS: 2.0000 mg | ORAL_TABLET | ORAL | 0 refills | Status: DC | PRN

## 2017-04-06 MED ORDER — DEXAMETHASONE SODIUM PHOSPHATE 10 MG/ML IJ SOLN
INTRAMUSCULAR | Status: AC
Start: 2017-04-06 — End: ?
  Filled 2017-04-06: qty 1

## 2017-04-06 MED ORDER — PALONOSETRON HCL INJECTION 0.25 MG/5ML
0.2500 mg | Freq: Once | INTRAVENOUS | Status: AC
  Administered 2017-04-06: 0.25 mg via INTRAVENOUS

## 2017-04-06 MED ORDER — MIDAZOLAM HCL 5 MG/5ML IJ SOLN
INTRAMUSCULAR | Status: DC | PRN
  Administered 2017-04-06: 2 mg via INTRAVENOUS

## 2017-04-06 MED ORDER — 0.9 % SODIUM CHLORIDE (POUR BTL) OPTIME
TOPICAL | Status: DC | PRN
  Administered 2017-04-06: 1000 mL

## 2017-04-06 MED ORDER — EPHEDRINE 5 MG/ML INJ
INTRAVENOUS | Status: AC
  Filled 2017-04-06: qty 10

## 2017-04-06 MED ORDER — FENTANYL CITRATE (PF) 100 MCG/2ML IJ SOLN
INTRAMUSCULAR | Status: DC | PRN
  Administered 2017-04-06 (×3): 50 ug via INTRAVENOUS

## 2017-04-06 MED ORDER — FENTANYL CITRATE (PF) 250 MCG/5ML IJ SOLN
INTRAMUSCULAR | Status: AC
  Filled 2017-04-06: qty 5

## 2017-04-06 MED ORDER — CEFAZOLIN SODIUM-DEXTROSE 2-4 GM/100ML-% IV SOLN
INTRAVENOUS | Status: AC
  Filled 2017-04-06: qty 100

## 2017-04-06 MED ORDER — SUCCINYLCHOLINE CHLORIDE 200 MG/10ML IV SOSY
PREFILLED_SYRINGE | INTRAVENOUS | Status: AC
  Filled 2017-04-06: qty 10

## 2017-04-06 MED ORDER — SODIUM CHLORIDE 0.9 % IV SOLN
600.0000 mg/m2 | Freq: Once | INTRAVENOUS | Status: AC
  Administered 2017-04-06: 1180 mg via INTRAVENOUS
  Filled 2017-04-06: qty 59

## 2017-04-06 MED ORDER — SODIUM CHLORIDE 0.9 % IV SOLN: 10.0000 mg | Freq: Once | INTRAVENOUS | Status: DC

## 2017-04-06 MED ORDER — ACETAMINOPHEN 500 MG PO TABS
ORAL_TABLET | ORAL | Status: AC
  Filled 2017-04-06: qty 2

## 2017-04-06 MED ORDER — DEXAMETHASONE 4 MG PO TABS: 8.0000 mg | ORAL_TABLET | Freq: Every day | ORAL | 1 refills | Status: DC

## 2017-04-06 MED ORDER — LIDOCAINE 2% (20 MG/ML) 5 ML SYRINGE
INTRAMUSCULAR | Status: DC | PRN
Start: 2017-04-06 — End: 2017-04-06
  Administered 2017-04-06: 80 mg via INTRAVENOUS

## 2017-04-06 MED ORDER — ONDANSETRON HCL 4 MG/2ML IJ SOLN
INTRAMUSCULAR | Status: AC
  Filled 2017-04-06: qty 2

## 2017-04-06 MED ORDER — FLUOROURACIL CHEMO INJECTION 2.5 GM/50ML
600.0000 mg/m2 | Freq: Once | INTRAVENOUS | Status: AC
  Administered 2017-04-06: 1200 mg via INTRAVENOUS
  Filled 2017-04-06: qty 24

## 2017-04-06 MED ORDER — POTASSIUM CHLORIDE CRYS ER 20 MEQ PO TBCR: 20.0000 meq | EXTENDED_RELEASE_TABLET | Freq: Once | ORAL | 4 refills | Status: DC

## 2017-04-06 MED ORDER — PROMETHAZINE HCL 25 MG/ML IJ SOLN: 6.2500 mg | INTRAMUSCULAR | Status: DC | PRN

## 2017-04-06 MED ORDER — ONDANSETRON HCL 4 MG/2ML IJ SOLN
INTRAMUSCULAR | Status: DC | PRN
  Administered 2017-04-06: 4 mg via INTRAVENOUS

## 2017-04-06 MED ORDER — BUPIVACAINE HCL (PF) 0.25 % IJ SOLN
INTRAMUSCULAR | Status: DC | PRN
  Administered 2017-04-06: 8 mL

## 2017-04-06 MED ORDER — HEPARIN SOD (PORK) LOCK FLUSH 100 UNIT/ML IV SOLN
500.0000 [IU] | Freq: Once | INTRAVENOUS | Status: AC | PRN
  Administered 2017-04-06: 500 [IU]
  Filled 2017-04-06: qty 5

## 2017-04-06 SURGICAL SUPPLY — 49 items
ADH SKN CLS APL DERMABOND .7 (GAUZE/BANDAGES/DRESSINGS) ×1
BAG DECANTER FOR FLEXI CONT (MISCELLANEOUS) ×3 IMPLANT
BLADE SURG 11 STRL SS (BLADE) ×3 IMPLANT
BLADE SURG 15 STRL LF DISP TIS (BLADE) ×1 IMPLANT
BLADE SURG 15 STRL SS (BLADE) ×3
CHLORAPREP W/TINT 26ML (MISCELLANEOUS) ×3 IMPLANT
COVER SURGICAL LIGHT HANDLE (MISCELLANEOUS) ×3 IMPLANT
COVER TRANSDUCER ULTRASND GEL (DRAPE) ×3 IMPLANT
CRADLE DONUT ADULT HEAD (MISCELLANEOUS) ×3 IMPLANT
DECANTER SPIKE VIAL GLASS SM (MISCELLANEOUS) ×3 IMPLANT
DERMABOND ADVANCED (GAUZE/BANDAGES/DRESSINGS) ×2
DERMABOND ADVANCED .7 DNX12 (GAUZE/BANDAGES/DRESSINGS) ×1 IMPLANT
DRAPE C-ARM 42X72 X-RAY (DRAPES) ×3 IMPLANT
DRAPE CHEST BREAST 15X10 FENES (DRAPES) ×3 IMPLANT
DRAPE UTILITY XL STRL (DRAPES) ×3 IMPLANT
ELECT CAUTERY BLADE 6.4 (BLADE) ×3 IMPLANT
ELECT REM PT RETURN 9FT ADLT (ELECTROSURGICAL) ×3
ELECTRODE REM PT RTRN 9FT ADLT (ELECTROSURGICAL) ×1 IMPLANT
GAUZE SPONGE 4X4 16PLY XRAY LF (GAUZE/BANDAGES/DRESSINGS) ×3 IMPLANT
GEL ULTRASOUND 20GR AQUASONIC (MISCELLANEOUS) ×3 IMPLANT
GLOVE BIO SURGEON STRL SZ7 (GLOVE) ×3 IMPLANT
GLOVE BIOGEL PI IND STRL 7.5 (GLOVE) ×1 IMPLANT
GLOVE BIOGEL PI INDICATOR 7.5 (GLOVE) ×2
GOWN STRL REUS W/ TWL LRG LVL3 (GOWN DISPOSABLE) ×2 IMPLANT
GOWN STRL REUS W/TWL LRG LVL3 (GOWN DISPOSABLE) ×6
INTRODUCER COOK 11FR (CATHETERS) IMPLANT
KIT BASIN OR (CUSTOM PROCEDURE TRAY) ×3 IMPLANT
KIT PORT POWER 8FR ISP CVUE (Miscellaneous) ×2 IMPLANT
KIT ROOM TURNOVER OR (KITS) ×3 IMPLANT
NDL HYPO 25GX1X1/2 BEV (NEEDLE) ×1 IMPLANT
NEEDLE HYPO 25GX1X1/2 BEV (NEEDLE) ×3 IMPLANT
NS IRRIG 1000ML POUR BTL (IV SOLUTION) ×3 IMPLANT
PACK SURGICAL SETUP 50X90 (CUSTOM PROCEDURE TRAY) ×3 IMPLANT
PAD ARMBOARD 7.5X6 YLW CONV (MISCELLANEOUS) ×6 IMPLANT
PENCIL BUTTON HOLSTER BLD 10FT (ELECTRODE) ×3 IMPLANT
SET INTRODUCER 12FR PACEMAKER (SHEATH) IMPLANT
SET SHEATH INTRODUCER 10FR (MISCELLANEOUS) IMPLANT
SHEATH COOK PEEL AWAY SET 9F (SHEATH) IMPLANT
SUT MNCRL AB 4-0 PS2 18 (SUTURE) ×3 IMPLANT
SUT PROLENE 2 0 SH DA (SUTURE) ×3 IMPLANT
SUT SILK 2 0 (SUTURE)
SUT SILK 2-0 18XBRD TIE 12 (SUTURE) IMPLANT
SUT VIC AB 3-0 SH 27 (SUTURE) ×3
SUT VIC AB 3-0 SH 27XBRD (SUTURE) ×1 IMPLANT
SYR 20ML ECCENTRIC (SYRINGE) ×6 IMPLANT
SYR 5ML LUER SLIP (SYRINGE) ×3 IMPLANT
SYR CONTROL 10ML LL (SYRINGE) IMPLANT
TOWEL OR 17X24 6PK STRL BLUE (TOWEL DISPOSABLE) ×3 IMPLANT
TOWEL OR 17X26 10 PK STRL BLUE (TOWEL DISPOSABLE) ×3 IMPLANT

## 2017-04-06 NOTE — Discharge Instructions (Signed)
    PORT-A-CATH: POST OP INSTRUCTIONS  Always review your discharge instruction sheet given to you by the facility where your surgery was performed.   1. A prescription for pain medication may be given to you upon discharge. Take your pain medication as prescribed, if needed. If narcotic pain medicine is not needed, then you make take acetaminophen (Tylenol) or ibuprofen (Advil) as needed.  2. Take your usually prescribed medications unless otherwise directed. 3. If you need a refill on your pain medication, please contact our office. All narcotic pain medicine now requires a paper prescription.  Phoned in and fax refills are no longer allowed by law.  Prescriptions will not be filled after 5 pm or on weekends.  4. You should follow a light diet for the remainder of the day after your procedure. 5. Most patients will experience some mild swelling and/or bruising in the area of the incision. It may take several days to resolve. 6. It is common to experience some constipation if taking pain medication after surgery. Increasing fluid intake and taking a stool softener (such as Colace) will usually help or prevent this problem from occurring. A mild laxative (Milk of Magnesia or Miralax) should be taken according to package directions if there are no bowel movements after 48 hours.  7. Unless discharge instructions indicate otherwise, you may remove your bandages 48 hours after surgery, and you may shower at that time. You may have steri-strips (small white skin tapes) in place directly over the incision.  These strips should be left on the skin for 7-10 days.  If your surgeon used Dermabond (skin glue) on the incision, you may shower in 24 hours.  The glue will flake off over the next 2-3 weeks.  8. If your port is left accessed at the end of surgery (needle left in port), the dressing cannot get wet and should only by changed by a healthcare professional. When the port is no longer accessed (when the  needle has been removed), follow step 7.   9. ACTIVITIES:  Limit activity involving your arms for the next 72 hours. Do no strenuous exercise or activity for 1 week. You may drive when you are no longer taking prescription pain medication, you can comfortably wear a seatbelt, and you can maneuver your car. 10.You may need to see your doctor in the office for a follow-up appointment.  Please       check with your doctor.  11.When you receive a new Port-a-Cath, you will get a product guide and        ID card.  Please keep them in case you need them.  WHEN TO CALL YOUR DOCTOR ([phone]): 1. Fever over 101.0 2. Chills 3. Continued bleeding from incision 4. Increased redness and tenderness at the site 5. Shortness of breath, difficulty breathing   The clinic staff is available to answer your questions during regular business hours. Please don't hesitate to call and ask to speak to one of the nurses or medical assistants for clinical concerns. If you have a medical emergency, go to the nearest emergency room or call 911.  A surgeon from [facility] is always on call at the hospital.     For further information, please visit [email or web address]      

## 2017-04-06 NOTE — Transfer of Care (Signed)
Immediate Anesthesia Transfer of Care Note  Patient: LARK LANGENFELD  Procedure(s) Performed: INSERTION PORT-A-CATH WITH [place] (Right Chest)  Patient Location: PACU  Anesthesia Type:General  Level of Consciousness: drowsy and patient cooperative  Airway & Oxygen Therapy: Patient Spontanous Breathing  Post-op Assessment: Report given to RN and Post -op Vital signs reviewed and stable  Post vital signs: Reviewed and stable  Last Vitals:  Vitals:   04/06/17 0557 04/06/17 0848  BP: 134/88   Pulse: (!) 102   Resp: 18   Temp: 36.8 C 36.6 C  SpO2: 100%     Last Pain:  Vitals:   04/06/17 0557  TempSrc: Oral      Patients Stated Pain Goal: 2 (63/84/66 5993)  Complications: No apparent anesthesia complications

## 2017-04-06 NOTE — Anesthesia Postprocedure Evaluation (Signed)
Anesthesia Post Note  Patient: Lindsay [name]  Procedure(s) Performed: INSERTION PORT-A-CATH WITH [place] (Right Chest)     Patient location during evaluation: PACU Anesthesia Type: General Level of consciousness: awake and alert Pain management: pain level controlled Vital Signs Assessment: post-procedure vital signs reviewed and stable Respiratory status: spontaneous breathing, nonlabored ventilation and respiratory function stable Cardiovascular status: blood pressure returned to baseline and stable Postop Assessment: no apparent nausea or vomiting Anesthetic complications: no    Last Vitals:  Vitals:   04/06/17 1015 04/06/17 1035  BP: 129/72 129/83  Pulse: 95 98  Resp: 16 16  Temp: 36.7 C   SpO2: 97% 100%    Last Pain:  Vitals:   04/06/17 1015  TempSrc:   PainSc: 0-No pain                 Lynda Rainwater

## 2017-04-06 NOTE — Anesthesia Preprocedure Evaluation (Signed)
Anesthesia Evaluation  Patient identified by MRN, date of birth, ID band Patient awake  General Assessment Comment:History noted. CG  Reviewed: Allergy & Precautions, NPO status , Patient's Chart, lab work & pertinent test results  Airway Mallampati: II  TM Distance: >3 FB     Dental   Pulmonary neg pulmonary ROS,    breath sounds clear to auscultation       Cardiovascular hypertension, Pt. on medications + Peripheral Vascular Disease   Rhythm:Regular Rate:Normal     Neuro/Psych Depression    GI/Hepatic negative GI ROS, Neg liver ROS,   Endo/Other    Renal/GU negative Renal ROS     Musculoskeletal  (+) Arthritis , Osteoarthritis,    Abdominal   Peds  Hematology   Anesthesia Other Findings Metastatic breast cancer  Reproductive/Obstetrics                             Anesthesia Physical  Anesthesia Plan  ASA: IV  Anesthesia Plan: General   Post-op Pain Management:    Induction: Intravenous  PONV Risk Score and Plan: 3 and Ondansetron, Midazolam and Dexamethasone  Airway Management Planned: LMA  Additional Equipment:   Intra-op Plan:   Post-operative Plan: Extubation in OR  Informed Consent: I have reviewed the patients History and Physical, chart, labs and discussed the procedure including the risks, benefits and alternatives for the proposed anesthesia with the patient or authorized representative who has indicated his/her understanding and acceptance.   Dental advisory given  Plan Discussed with: CRNA and Anesthesiologist  Anesthesia Plan Comments:         Anesthesia Quick Evaluation

## 2017-04-06 NOTE — H&P (Signed)
Lindsay [name] is an 51 y.o. female.   Chief Complaint: stage IV breast cancer HPI:  66 yof who was diagnosed 8/17 with right breast cancer that was tn. she underwent primary chemotherapy, genetics negative followed by lump/sn for residual ypT2ypN0 cancer. she completed radiation with boost 3/18. She now has stage IV disease and a port is requested.   Past Medical History:  Diagnosis Date  . Allergy    seasonal  . Arthritis   . Breast cancer of upper-outer quadrant of right female breast ([place]) 12/16/2015  . Depression    mild depression after death of daughter- no meds  . History of radiation therapy 06/20/17-08/04/16   right breast 50.4 gy in 28 fractions, boost 10 Gy in 5 fractions  . Hot flashes   . Hx of adenomatous colonic polyps 04/25/2014  . Hypertension     Past Surgical History:  Procedure Laterality Date  . ABDOMINAL HYSTERECTOMY    . BREAST LUMPECTOMY WITH RADIOACTIVE SEED AND SENTINEL LYMPH NODE BIOPSY Right 05/05/2016   Procedure: BREAST LUMPECTOMY WITH RADIOACTIVE SEED AND SENTINEL LYMPH NODE BIOPSY;  Surgeon: Rolm Bookbinder, MD;  Location: [facility];  Service: General;  Laterality: Right;  . CHOLECYSTECTOMY N/A 09/09/2015   Procedure: LAPAROSCOPIC CHOLECYSTECTOMY;  Surgeon: Ralene Ok, MD;  Location: [facility];  Service: General;  Laterality: N/A;  . COLONOSCOPY    . KNEE ARTHROSCOPY  2016  . PERIPHERAL VASCULAR CATHETERIZATION Right 05/05/2016   Procedure: PORTA CATH REMOVAL;  Surgeon: Rolm Bookbinder, MD;  Location: [facility];  Service: General;  Laterality: Right;  . PORTACATH PLACEMENT Right 12/29/2015   Procedure: INSERTION PORT-A-CATH WITH ULTRASOUND GUIDANCE;  Surgeon: Rolm Bookbinder, MD;  Location: [facility];  Service: General;  Laterality: Right;    Family History  Problem Relation Age of Onset  . Hypertension Mother   . Breast cancer Maternal Grandfather   . Breast cancer Other   . Breast cancer Cousin   . Sudden  death Neg Hx   . Diabetes Neg Hx   . Heart attack Neg Hx   . Hyperlipidemia Neg Hx   . Colon cancer Neg Hx   . Esophageal cancer Neg Hx   . Rectal cancer Neg Hx   . Stomach cancer Neg Hx    Social History:  reports that  has never smoked. she has never used smokeless tobacco. She reports that she does not drink alcohol or use drugs.  Allergies:  Allergies  Allergen Reactions  . Gabapentin Other (See Comments)    "light headed, dizziness, didn't like the way it made me feel"  . Percocet [Oxycodone-Acetaminophen] Diarrhea and Nausea And Vomiting  . Pollen Extract Other (See Comments)    Runny nose, itchy eyes and sneezing due to seasonal allergies.  . Tramadol Nausea And Vomiting  . Vicodin [Hydrocodone-Acetaminophen] Nausea And Vomiting    Medications Prior to Admission  Medication Sig Dispense Refill  . HYDROmorphone (DILAUDID) 2 MG tablet Take 1-2 tablets (2-4 mg total) by mouth every 4 (four) hours as needed for severe pain. 120 tablet 0  . lisinopril-hydrochlorothiazide (PRINZIDE,ZESTORETIC) 20-12.5 MG per tablet Take 1 tablet by mouth daily.    . NON FORMULARY 1 Syringe every 7 (seven) days. Allergy Shot    . ALPRAZolam (XANAX) 0.5 MG tablet Take 0.5 mg by mouth daily as needed. For anxiety/nerves.  0  . EPINEPHrine (EPIPEN 2-PAK) 0.3 mg/0.3 mL IJ SOAJ injection Inject 0.3 mg into the muscle once as needed (anaphylaxis allergic reaction).     Marland Kitchen  fexofenadine (ALLEGRA) 180 MG tablet Take 180 mg by mouth daily as needed (for allergies.).     Marland Kitchen ibuprofen (ADVIL,MOTRIN) 800 MG tablet Take 1 tablet (800 mg total) 3 (three) times daily by mouth. (Patient taking differently: Take 800 mg by mouth 3 (three) times daily as needed (for pain.). ) 21 tablet 0  . prochlorperazine (COMPAZINE) 10 MG tablet Take 1 tablet (10 mg total) every 6 (six) hours as needed by mouth (Nausea or vomiting). 30 tablet 1  . venlafaxine XR (EFFEXOR-XR) 75 MG 24 hr capsule Take 75 mg by mouth daily.  3    No  results found for this or any previous visit (from the past 48 hour(s)). No results found.  Review of Systems  All other systems reviewed and are negative.   Blood pressure 134/88, pulse (!) 102, temperature 98.3 F (36.8 C), temperature source Oral, resp. rate 18, height 5\' 6"  (1.676 m), weight 83.9 kg (185 lb), SpO2 100 %. Physical Exam  cv rrr Lungs clear  Assessment/Plan Port placement   Rolm Bookbinder, MD 04/06/2017, 7:30 AM

## 2017-04-06 NOTE — Anesthesia Procedure Notes (Signed)
Procedure Name: LMA Insertion Date/Time: 04/06/2017 7:44 AM Performed by: Orlie Dakin, CRNA Pre-anesthesia Checklist: Patient identified, Emergency Drugs available, Suction available, Patient being monitored and Timeout performed Patient Re-evaluated:Patient Re-evaluated prior to induction Oxygen Delivery Method: Circle system utilized Preoxygenation: Pre-oxygenation with 100% oxygen Induction Type: IV induction Ventilation: Mask ventilation without difficulty LMA: LMA inserted LMA Size: 4.0 Tube type: Oral Number of attempts: 1 Placement Confirmation: positive ETCO2 and breath sounds checked- equal and bilateral Tube secured with: Tape Dental Injury: Teeth and Oropharynx as per pre-operative assessment

## 2017-04-06 NOTE — Interval H&P Note (Signed)
History and Physical Interval Note:  04/06/2017 7:32 AM  Lindsay [name]  has presented today for surgery, with the diagnosis of BREAST CANCER  The various methods of treatment have been discussed with the patient and family. After consideration of risks, benefits and other options for treatment, the patient has consented to  Procedure(s): INSERTION PORT-A-CATH WITH [place] (N/A) as a surgical intervention .  The patient's history has been reviewed, patient examined, no change in status, stable for surgery.  I have reviewed the patient's chart and labs.  Questions were answered to the patient's satisfaction.     Rolm Bookbinder

## 2017-04-06 NOTE — Progress Notes (Signed)
[facility]  [phone] [phone]     ID: Lindsay [name] DOB: 09/14/1965  MR#: [id number]  ION#:[id number]  Patient Care Team: Bernerd Limbo, MD as PCP - General (Family Medicine) Rolm Bookbinder, MD as Consulting Physician (General Surgery) Magrinat, Virgie Dad, MD as Consulting Physician (Oncology) Gery Pray, MD as Consulting Physician (Radiation Oncology) Newton Pigg, MD as Consulting Physician (Obstetrics and Gynecology) Dorna Leitz, MD as Consulting Physician (Orthopedic Surgery) Delice Bison, Charlestine Massed, NP as Nurse Practitioner (Hematology and Oncology) OTHER MD:  CHIEF COMPLAINT: Triple negative stage IV breast cancer  CURRENT TREATMENT: CMF,  denosumab/Xgeva   BREAST CANCER HISTORY: From the original intake note:  Lindsay [name] had routine screening mammography with tomography at [facility] 12/04/2015. There was a new mass measuring 1.5 cm at the 11:00 position of the right breast. Right breast ultrasonography 12/10/2015 confirmed a 1.9 cm irregular mass in the upper-outer quadrant of the right breast posteriorly. There was a right axillary lymph node with focal cortical thickening.  On 12/14/2015 Lindsay [name] underwent biopsy of the right breast mass at 9:00 position as well as a suspicious lymph node. A separate area in the right breast 10 cm from the nipple was also biopsied biopsied. The final pathology ([id number]) showed the additional area and the lymph node to be benign. The 9:00 mass however was positive for invasive ductal carcinoma, grade 3, estrogen receptor and progesterone receptor negative, with an MIB-1 of 70%, and HER-2 nonamplified with a signals ratio 1.44, and the number per cell 2.70.  Her subsequent history is as detailed below  INTERVAL HISTORY: Lindsay [name] returns today for follow-up and treatment of her metastatic triple negative breast cancer. She is accompanied by her motherand husband. Today is day 1 cycle 1 of CMF, to be  repeated every 21 days.  She also receives denosumab/Xgeva every 28 days, with a dose due today  Since her last visit here also we obtain results of her PD/L1, which is unfortunately negative.  REVIEW OF SYSTEMS: Lindsay [name] had her port put back in today and has not been feeling well since. Yesterday, she had company over and moved from her bed to the living room. She is in a lot of pain and has been taking Dilaudid irregularly. She reports normal bowel movements everyday. Lindsay [name] endorses nausea from taking the dilaudid and states she does not take anything to alleviate her symptoms. She denies unusual headaches, visual changes, vomiting, or dizziness. There has been no unusual cough, phlegm production, or pleurisy. This been no change in bowel or bladder habits. She denies unexplained fatigue or unexplained weight loss, bleeding, rash, or fever. A detailed review of systems was otherwise entirely stable.   PAST MEDICAL HISTORY: Past Medical History:  Diagnosis Date  . Allergy    seasonal  . Arthritis   . Breast cancer of upper-outer quadrant of right female breast ([place]) 12/16/2015  . Depression    mild depression after death of daughter- no meds  . History of radiation therapy 06/20/17-08/04/16   right breast 50.4 gy in 28 fractions, boost 10 Gy in 5 fractions  . Hot flashes   . Hx of adenomatous colonic polyps 04/25/2014  . Hypertension     PAST SURGICAL HISTORY: Past Surgical History:  Procedure Laterality Date  . ABDOMINAL HYSTERECTOMY    . BREAST LUMPECTOMY WITH RADIOACTIVE SEED AND SENTINEL LYMPH NODE BIOPSY Right 05/05/2016   Procedure: BREAST LUMPECTOMY WITH RADIOACTIVE SEED AND SENTINEL LYMPH NODE BIOPSY;  Surgeon: Rolm Bookbinder, MD;  Location: [facility];  Service: General;  Laterality: Right;  . CHOLECYSTECTOMY N/A 09/09/2015   Procedure: LAPAROSCOPIC CHOLECYSTECTOMY;  Surgeon: Ralene Ok, MD;  [facility];  Service: General;  Laterality: N/A;  .  COLONOSCOPY    . KNEE ARTHROSCOPY  2016  . PERIPHERAL VASCULAR CATHETERIZATION Right 05/05/2016   Procedure: PORTA CATH REMOVAL;  Surgeon: Rolm Bookbinder, MD;  Location: [facility];  Service: General;  Laterality: Right;  . PORTACATH PLACEMENT Right 12/29/2015   Procedure: INSERTION PORT-A-CATH WITH ULTRASOUND GUIDANCE;  Surgeon: Rolm Bookbinder, MD;  Location: [facility];  Service: General;  Laterality: Right;    FAMILY HISTORY Family History  Problem Relation Age of Onset  . Hypertension Mother   . Breast cancer Maternal Grandfather   . Breast cancer Other   . Breast cancer Cousin   . Sudden death Neg Hx   . Diabetes Neg Hx   . Heart attack Neg Hx   . Hyperlipidemia Neg Hx   . Colon cancer Neg Hx   . Esophageal cancer Neg Hx   . Rectal cancer Neg Hx   . Stomach cancer Neg Hx   The patient's father was murdered at age 92. The patient's mother is 20 years old as of August 2017. The patient has one brother, 2 sisters. A cousin was diagnosed with left breast cancer at age 67. A maternal aunt and a maternal grandmother were also diagnosed with breast cancer at age 72 and 71 respectively.  GYNECOLOGIC HISTORY:  No LMP recorded. Patient has had a hysterectomy.  Menarche age 14, first live birth age 29, the patient is GX P3. She is status post hysterectomy without salpingo-oophorectomy. She did not take hormone replacement. She used oral contraceptives remotely without complications.  SOCIAL HISTORY:  Lindsay [name] worked as an Engineer, production at [employer].  She is now applying for disability.  Her husband Lindsay [name] is a news and record Lexicographer. Son Lindsay [name] lives in [place] and is a Games developer. Son Lindsay [name] lives in [place] and is a Architectural technologist. The patient had a daughter who died at the age of 22.    ADVANCED DIRECTIVES: Not in place   HEALTH MAINTENANCE: Social History   Tobacco Use  . Smoking status: Never Smoker  . Smokeless tobacco: Never Used    Substance Use Topics  . Alcohol use: No    Alcohol/week: 0.0 oz  . Drug use: No     Colonoscopy: 2016  PAP:  Bone density: Never   Allergies  Allergen Reactions  . Gabapentin Other (See Comments)    "light headed, dizziness, didn't like the way it made me feel"  . Percocet [Oxycodone-Acetaminophen] Diarrhea and Nausea And Vomiting  . Pollen Extract Other (See Comments)    Runny nose, itchy eyes and sneezing due to seasonal allergies.  . Tramadol Nausea And Vomiting  . Vicodin [Hydrocodone-Acetaminophen] Nausea And Vomiting    Current Outpatient Medications  Medication Sig Dispense Refill  . ALPRAZolam (XANAX) 0.5 MG tablet Take 0.5 mg by mouth daily as needed. For anxiety/nerves.  0  . dexamethasone (DECADRON) 4 MG tablet Take 2 tablets (8 mg total) by mouth daily. Start the day after chemotherapy for 2 days. Take with food. 30 tablet 1  . EPINEPHrine (EPIPEN 2-PAK) 0.3 mg/0.3 mL IJ SOAJ injection Inject 0.3 mg into the muscle once as needed (anaphylaxis allergic reaction).     . fexofenadine (ALLEGRA) 180 MG tablet Take 180 mg by mouth daily as needed (for allergies.).     Marland Kitchen  HYDROmorphone (DILAUDID) 2 MG tablet Take 1-2 tablets (2-4 mg total) by mouth every 4 (four) hours as needed for severe pain. 120 tablet 0  . ibuprofen (ADVIL,MOTRIN) 800 MG tablet Take 1 tablet (800 mg total) 3 (three) times daily by mouth. (Patient taking differently: Take 800 mg by mouth 3 (three) times daily as needed (for pain.). ) 21 tablet 0  . lidocaine-prilocaine (EMLA) cream Apply to affected area once 30 g 3  . lisinopril-hydrochlorothiazide (PRINZIDE,ZESTORETIC) 20-12.5 MG per tablet Take 1 tablet by mouth daily.    . NON FORMULARY 1 Syringe every 7 (seven) days. Allergy Shot    . ondansetron (ZOFRAN) 8 MG tablet Take 1 tablet (8 mg total) by mouth 2 (two) times daily as needed for refractory nausea / vomiting. Start on day 3 after chemotherapy. 30 tablet 1  . oxyCODONE (OXY IR/ROXICODONE) 5 MG  immediate release tablet Take 1 tablet (5 mg total) by mouth every 6 (six) hours as needed for moderate pain, severe pain or breakthrough pain. 10 tablet 0  . prochlorperazine (COMPAZINE) 10 MG tablet Take 1 tablet (10 mg total) every 6 (six) hours as needed by mouth (Nausea or vomiting). 30 tablet 1  . venlafaxine XR (EFFEXOR-XR) 75 MG 24 hr capsule Take 75 mg by mouth daily.  3   No current facility-administered medications for this visit.     OBJECTIVE: Middle-aged African-American woman examined in a wheelchair  Vitals:   04/06/17 1351  BP: 113/76  Pulse: 93  Resp: 20  Temp: 98.4 F (36.9 C)  SpO2: 100%     Body mass index is 29.86 kg/m.    ECOG FS:2 - Symptomatic, <50% confined to bed  Pupils round and equal No cervical or supraclavicular adenopathy Lungs no rales or rhonchi Heart regular rate and rhythm Abd soft, nontender, positive bowel sounds MSK no focal spinal tenderness, no upper extremity lymphedema Neuro: nonfocal, well oriented, depressed and discouraged affect Breasts: Deferred  LAB RESULTS:  CMP     Component Value Date/Time   NA 141 04/06/2017 1327   K 3.0 Repeated and Verified (LL) 04/06/2017 1327   CL 100 (L) 04/09/2016 1310   CO2 24 04/06/2017 1327   GLUCOSE 110 04/06/2017 1327   BUN 18.0 04/06/2017 1327   CREATININE 0.8 04/06/2017 1327   CALCIUM 7.3 (L) 04/06/2017 1327   PROT 7.6 04/06/2017 1327   ALBUMIN 3.8 04/06/2017 1327   AST 164 (H) 04/06/2017 1327   ALT 105 (H) 04/06/2017 1327   ALKPHOS 41 04/06/2017 1327   BILITOT 1.03 04/06/2017 1327   GFRNONAA >60 04/09/2016 1310   GFRAA >60 04/09/2016 1310    INo results found for: SPEP, UPEP  Lab Results  Component Value Date   WBC 3.9 04/06/2017   NEUTROABS 2.8 04/06/2017   HGB 10.1 (L) 04/06/2017   HCT 30.9 (L) 04/06/2017   MCV 84.2 04/06/2017   PLT 139 (L) 04/06/2017      Chemistry      Component Value Date/Time   NA 141 04/06/2017 1327   K 3.0 Repeated and Verified (LL)  04/06/2017 1327   CL 100 (L) 04/09/2016 1310   CO2 24 04/06/2017 1327   BUN 18.0 04/06/2017 1327   CREATININE 0.8 04/06/2017 1327      Component Value Date/Time   CALCIUM 7.3 (L) 04/06/2017 1327   ALKPHOS 41 04/06/2017 1327   AST 164 (H) 04/06/2017 1327   ALT 105 (H) 04/06/2017 1327   BILITOT 1.03 04/06/2017 1327  No results found for: LABCA2  No components found for: [id number]  No results for input(s): INR in the last 168 hours.  Urinalysis    Component Value Date/Time   COLORURINE YELLOW 04/09/2016 1455   APPEARANCEUR CLOUDY (A) 04/09/2016 1455   LABSPEC 1.011 04/09/2016 1455   PHURINE 7.0 04/09/2016 1455   GLUCOSEU NEGATIVE 04/09/2016 1455   HGBUR NEGATIVE 04/09/2016 1455   BILIRUBINUR NEGATIVE 04/09/2016 1455   KETONESUR NEGATIVE 04/09/2016 1455   PROTEINUR NEGATIVE 04/09/2016 1455   UROBILINOGEN 1.0 03/03/2015 1240   NITRITE NEGATIVE 04/09/2016 1455   LEUKOCYTESUR NEGATIVE 04/09/2016 1455     STUDIES: Dg Chest Port 1 View  Result Date: 04/06/2017 CLINICAL DATA:  Port catheter placement. EXAM: PORTABLE CHEST 1 VIEW COMPARISON:  MRI abdomen dated March 01, 2017. Chest x-ray dated January 24, 2016. FINDINGS: Right port catheter with the tip projecting over the proximal SVC. The cardiomediastinal silhouette is normal in size. Normal pulmonary vascularity. Increased reticulonodular interstitial lung markings in both lung bases. No pleural effusion or pneumothorax. No acute osseous abnormality. IMPRESSION: 1. Right port catheter with tip projecting over the proximal SVC. No pneumothorax. 2. Increased reticulonodular densities in both lower lobes, consistent with known pulmonary metastatic disease. Electronically Signed   By: Titus Dubin M.D.   On: 04/06/2017 09:26   Dg Fluoro Guide Cv Line-no Report  Result Date: 04/06/2017 Fluoroscopy was utilized by the requesting physician.  No radiographic interpretation.   Dg Femur Min 2 Views Left  Result Date:  03/13/2017 CLINICAL DATA:  Mid left femur pain for 1.5 months. No known injury. EXAM: LEFT FEMUR 2 VIEWS COMPARISON:  None. FINDINGS: No fracture or dislocation. No focal lesion. Small osteophytes are seen about the left knee. The left hip appears normal. IMPRESSION: No acute or focal abnormality. Mild appearing left knee osteoarthritis. Electronically Signed   By: Inge Rise M.D.   On: 03/13/2017 14:06     ELIGIBLE FOR AVAILABLE RESEARCH PROTOCOL: considered the PREVENT trial: decided against it  ASSESSMENT: 51 y.o. [place] woman status post right breast upper outer quadrant biopsy 12/14/2015 for a clinical T1c N0, stage IA  invasive ductal carcinoma, grade 3, triple negative, with an MIB-1 of 70%  (a) this stages as IIB In the 2018 new Prognostic classification  (1) genetics testing negative for mutations within any of 20 genes on the Breast/Ovarian Cancer Panel through [employer].  Additionally, no VUS were found.   (2) neoadjuvant chemotherapy consisting of doxorubicin and cyclophosphamide in dose this fashion 4, completed 02/22/2016, followed by paclitaxel weekly 5 (of 12 treatments planned), starting 03/08/2016, stopped 04/05/2016 after cycle 5 because of neuropathy  (a) cycle 4 of cyclophosphamide and doxorubicin held 1 week because of side effects  (3) status post right lumpectomy and sentinel lymph node sampling 05/05/2016 for a residual ypT2 ypN) invasive ductal carcinoma, grade 3, the repeat prognostic panel again triple negative the residual cancer burden was 2  (4) adjuvant radiation 06/20/16 - 08/04/16 : 50.4 Gy to the right breast plus a 10 Gy boost  (5) left thyroid nodule biopsy 04/14/2016 read as [facility] 1  METASTATIC DISEASE: October 2018 (6) MRI of the abdomen March 01, 2017 shows multiple lesions in the lungs, liver, and bones  (a) the tumor is PD-L1 negative  (7) started capecitabine March 14, 2017; plan is to take 14 days on, 7 days off  (a)  discontinued 03/19/2017 with multiple side effects  (8) started denosumab/Xgeva March 10, 2017, to be repeated every 28 days  (  9) pain syndrome: currently on hydromorphone 2 mg Q4h PRN  (10) started CMF chemotherapy 04/06/2017, to be repeated every 21 days  (1) her tumor is PD-L1 negative (03/22/2017)     PLAN: Lecia feels miserable today.  Despite her and her husband's and our best efforts her pain is still not well controlled.  1+ is that despite the fact that she has been taking narcotics she is having good bowel movements, which are not hard.  Her husband has been very involved in her care.  He has been making sure the disability papers get completed and signed and that has been accomplished.  He has been managing her pain medicine but that has not been working very well and today I urged Christyl to go ahead and take control of her pain issues.  She needs to know the name of the medicine she is taking why she is taking it, and when she is supposed to take it.  I have put the instructions in an addendum below and I handed her a copy of that so she could follow it.  I offered to postpone her CMF treatment but she is eager to get started and so we are going ahead and treating her today.  She understands that she is supposed to take her nausea medicine over the next 3 days.  If she follows these instructions and her pain instructions I think by the next time we see her she will be feeling a lot better and more positive.  We are also starting potassium supplementation at this point.  Unfortunately her PD/L1 came back 0%.  But this means she would not benefit from [employer].  We are going to see her again in a week, and then again with her second cycle of CMF, but of course we will were Santiago Glad on an as-needed basis.  If her pain is not controlled or if she has significant problems with constipation or other issues she will let us know.  Magrinat, Virgie Dad, MD  04/06/17 2:36 PM Medical  Oncology and Hematology [facility] [address] [place], [place] [address] Tel. [phone]    Fax. [phone]    This document serves as a record of services personally performed by Chauncey Cruel, MD. It was created on his behalf by Margit Banda, a trained medical scribe. The creation of this record is based on the scribe's personal observations and the provider's statements to them.   I have reviewed the above documentation for accuracy and completeness, and I agree with the above.   On Friday, Saturday and Sunday (NOV 30 - DEC 2) for NAUSEA  Take dexamethasone/decadron  4 mg one tablet twice daily with food  If you still have nausea, take proclorperazine/Compazine  FOR PAIN: Take hydromorphone 2 mg (one tablet) every 4 hours  (8 AM, noon, 4 PM, 8 PM)  At bedtime take 2 tablets If the pain is not controlled on this regimen, please call to discuss with the nurse [phone])  MUST HAVE a soft bowel movement at least every other day TRY TO DRINK at least 2 quarts of liquid per day ALSO take a potassium tablet daily

## 2017-04-06 NOTE — Telephone Encounter (Signed)
Gave patients husband AVS and calendar of upcoming December through February appointments

## 2017-04-06 NOTE — Patient Instructions (Signed)
[facility] Discharge Instructions for Patients Receiving Chemotherapy  Today you received the following chemotherapy agents: Cytoxan, Methotrexate and Adrucil   To help prevent nausea and vomiting after your treatment, we encourage you to take your nausea medication as directed.    If you develop nausea and vomiting that is not controlled by your nausea medication, call the clinic.   BELOW ARE SYMPTOMS THAT SHOULD BE REPORTED IMMEDIATELY:  *FEVER GREATER THAN 100.5 F  *CHILLS WITH OR WITHOUT FEVER  NAUSEA AND VOMITING THAT IS NOT CONTROLLED WITH YOUR NAUSEA MEDICATION  *UNUSUAL SHORTNESS OF BREATH  *UNUSUAL BRUISING OR BLEEDING  TENDERNESS IN MOUTH AND THROAT WITH OR WITHOUT PRESENCE OF ULCERS  *URINARY PROBLEMS  *BOWEL PROBLEMS  UNUSUAL RASH Items with * indicate a potential emergency and should be followed up as soon as possible.  Feel free to call the clinic should you have any questions or concerns. The clinic phone number is [phone].  Please show the [facility] at check-in to the Emergency Department and triage nurse.  Cyclophosphamide injection (Cytoxan) What is this medicine? CYCLOPHOSPHAMIDE (sye kloe FOSS fa mide) is a chemotherapy drug. It slows the growth of cancer cells. This medicine is used to treat many types of cancer like lymphoma, myeloma, leukemia, breast cancer, and ovarian cancer, to name a few. This medicine may be used for other purposes; ask your health care provider or pharmacist if you have questions. COMMON BRAND NAME(S): Cytoxan, Neosar What should I tell my health care provider before I take this medicine? They need to know if you have any of these conditions: -blood disorders -history of other chemotherapy -infection -kidney disease -liver disease -recent or ongoing radiation therapy -tumors in the bone marrow -an unusual or allergic reaction to cyclophosphamide, other chemotherapy, other medicines, foods,  dyes, or preservatives -pregnant or trying to get pregnant -breast-feeding How should I use this medicine? This drug is usually given as an injection into a vein or muscle or by infusion into a vein. It is administered in a hospital or clinic by a specially trained health care professional. Talk to your pediatrician regarding the use of this medicine in children. Special care may be needed. Overdosage: If you think you have taken too much of this medicine contact a poison control center or emergency room at once. NOTE: This medicine is only for you. Do not share this medicine with others. What if I miss a dose? It is important not to miss your dose. Call your doctor or health care professional if you are unable to keep an appointment. What may interact with this medicine? This medicine may interact with the following medications: -amiodarone -amphotericin B -azathioprine -certain antiviral medicines for HIV or AIDS such as protease inhibitors (e.g., indinavir, ritonavir) and zidovudine -certain blood pressure medications such as benazepril, captopril, enalapril, fosinopril, lisinopril, moexipril, monopril, perindopril, quinapril, ramipril, trandolapril -certain cancer medications such as anthracyclines (e.g., daunorubicin, doxorubicin), busulfan, cytarabine, paclitaxel, pentostatin, tamoxifen, trastuzumab -certain diuretics such as chlorothiazide, chlorthalidone, hydrochlorothiazide, indapamide, metolazone -certain medicines that treat or prevent blood clots like warfarin -certain muscle relaxants such as succinylcholine -cyclosporine -etanercept -indomethacin -medicines to increase blood counts like filgrastim, pegfilgrastim, sargramostim -medicines used as general anesthesia -metronidazole -natalizumab This list may not describe all possible interactions. Give your health care provider a list of all the medicines, herbs, non-prescription drugs, or dietary supplements you use. Also tell  them if you smoke, drink alcohol, or use illegal drugs. Some items may interact with your medicine.  What should I watch for while using this medicine? Visit your doctor for checks on your progress. This drug may make you feel generally unwell. This is not uncommon, as chemotherapy can affect healthy cells as well as cancer cells. Report any side effects. Continue your course of treatment even though you feel ill unless your doctor tells you to stop. Drink water or other fluids as directed. Urinate often, even at night. In some cases, you may be given additional medicines to help with side effects. Follow all directions for their use. Call your doctor or health care professional for advice if you get a fever, chills or sore throat, or other symptoms of a cold or flu. Do not treat yourself. This drug decreases your body's ability to fight infections. Try to avoid being around people who are sick. This medicine may increase your risk to bruise or bleed. Call your doctor or health care professional if you notice any unusual bleeding. Be careful brushing and flossing your teeth or using a toothpick because you may get an infection or bleed more easily. If you have any dental work done, tell your dentist you are receiving this medicine. You may get drowsy or dizzy. Do not drive, use machinery, or do anything that needs mental alertness until you know how this medicine affects you. Do not become pregnant while taking this medicine or for 1 year after stopping it. Women should inform their doctor if they wish to become pregnant or think they might be pregnant. Men should not father a child while taking this medicine and for 4 months after stopping it. There is a potential for serious side effects to an unborn child. Talk to your health care professional or pharmacist for more information. Do not breast-feed an infant while taking this medicine. This medicine may interfere with the ability to have a child. This  medicine has caused ovarian failure in some women. This medicine has caused reduced sperm counts in some men. You should talk with your doctor or health care professional if you are concerned about your fertility. If you are going to have surgery, tell your doctor or health care professional that you have taken this medicine. What side effects may I notice from receiving this medicine? Side effects that you should report to your doctor or health care professional as soon as possible: -allergic reactions like skin rash, itching or hives, swelling of the face, lips, or tongue -low blood counts - this medicine may decrease the number of white blood cells, red blood cells and platelets. You may be at increased risk for infections and bleeding. -signs of infection - fever or chills, cough, sore throat, pain or difficulty passing urine -signs of decreased platelets or bleeding - bruising, pinpoint red spots on the skin, black, tarry stools, blood in the urine -signs of decreased red blood cells - unusually weak or tired, fainting spells, lightheadedness -breathing problems -dark urine -dizziness -palpitations -swelling of the ankles, feet, hands -trouble passing urine or change in the amount of urine -weight gain -yellowing of the eyes or skin Side effects that usually do not require medical attention (report to your doctor or health care professional if they continue or are bothersome): -changes in nail or skin color -hair loss -missed menstrual periods -mouth sores -nausea, vomiting This list may not describe all possible side effects. Call your doctor for medical advice about side effects. You may report side effects to FDA at 1-800-FDA-1088. Where should I keep my medicine? This drug is  given in a hospital or clinic and will not be stored at home. NOTE: This sheet is a summary. It may not cover all possible information. If you have questions about this medicine, talk to your doctor,  pharmacist, or health care provider.  2018 Elsevier/Gold Standard (2012-03-09 16:22:58)  Methotrexate injection What is this medicine? METHOTREXATE (METH oh TREX ate) is a chemotherapy drug used to treat cancer including breast cancer, leukemia, and lymphoma. This medicine can also be used to treat psoriasis and certain kinds of arthritis. This medicine may be used for other purposes; ask your health care provider or pharmacist if you have questions. What should I tell my health care provider before I take this medicine? They need to know if you have any of these conditions: -fluid in the stomach area or lungs -if you often drink alcohol -infection or immune system problems -kidney disease -liver disease -low blood counts, like low white cell, platelet, or red cell counts -lung disease -radiation therapy -stomach ulcers -ulcerative colitis -an unusual or allergic reaction to methotrexate, other medicines, foods, dyes, or preservatives -pregnant or trying to get pregnant -breast-feeding How should I use this medicine? This medicine is for infusion into a vein or for injection into muscle or into the spinal fluid (whichever applies). It is usually given by a health care professional in a hospital or clinic setting. In rare cases, you might get this medicine at home. You will be taught how to give this medicine. Use exactly as directed. Take your medicine at regular intervals. Do not take your medicine more often than directed. If this medicine is used for arthritis or psoriasis, it should be taken weekly, NOT daily. It is important that you put your used needles and syringes in a special sharps container. Do not put them in a trash can. If you do not have a sharps container, call your pharmacist or healthcare provider to get one. Talk to your pediatrician regarding the use of this medicine in children. While this drug may be prescribed for children as young as 2 years for selected  conditions, precautions do apply. Overdosage: If you think you have taken too much of this medicine contact a poison control center or emergency room at once. NOTE: This medicine is only for you. Do not share this medicine with others. What if I miss a dose? It is important not to miss your dose. Call your doctor or health care professional if you are unable to keep an appointment. If you give yourself the medicine and you miss a dose, talk with your doctor or health care professional. Do not take double or extra doses. What may interact with this medicine? This medicine may interact with the following medications: -acitretin -aspirin or aspirin-like medicines including salicylates -azathioprine -certain antibiotics like chloramphenicol, penicillin, tetracycline -certain medicines for stomach problems like esomeprazole, omeprazole, pantoprazole -cyclosporine -gold -hydroxychloroquine -live virus vaccines -mercaptopurine -NSAIDs, medicines for pain and inflammation, like ibuprofen or naproxen -other cytotoxic agents -penicillamine -phenylbutazone -phenytoin -probenacid -retinoids such as isotretinoin and tretinoin -steroid medicines like prednisone or cortisone -sulfonamides like sulfasalazine and trimethoprim/sulfamethoxazole -theophylline This list may not describe all possible interactions. Give your health care provider a list of all the medicines, herbs, non-prescription drugs, or dietary supplements you use. Also tell them if you smoke, drink alcohol, or use illegal drugs. Some items may interact with your medicine. What should I watch for while using this medicine? Avoid alcoholic drinks. In some cases, you may be given additional medicines to help  with side effects. Follow all directions for their use. This medicine can make you more sensitive to the sun. Keep out of the sun. If you cannot avoid being in the sun, wear protective clothing and use sunscreen. Do not use sun lamps  or tanning beds/booths. You may get drowsy or dizzy. Do not drive, use machinery, or do anything that needs mental alertness until you know how this medicine affects you. Do not stand or sit up quickly, especially if you are an older patient. This reduces the risk of dizzy or fainting spells. You may need blood work done while you are taking this medicine. Call your doctor or health care professional for advice if you get a fever, chills or sore throat, or other symptoms of a cold or flu. Do not treat yourself. This drug decreases your body's ability to fight infections. Try to avoid being around people who are sick. This medicine may increase your risk to bruise or bleed. Call your doctor or health care professional if you notice any unusual bleeding. Check with your doctor or health care professional if you get an attack of severe diarrhea, nausea and vomiting, or if you sweat a lot. The loss of too much body fluid can make it dangerous for you to take this medicine. Talk to your doctor about your risk of cancer. You may be more at risk for certain types of cancers if you take this medicine. Both men and women must use effective birth control with this medicine. Do not become pregnant while taking this medicine or until at least 1 normal menstrual cycle has occurred after stopping it. Women should inform their doctor if they wish to become pregnant or think they might be pregnant. Men should not father a child while taking this medicine and for 3 months after stopping it. There is a potential for serious side effects to an unborn child. Talk to your health care professional or pharmacist for more information. Do not breast-feed an infant while taking this medicine. What side effects may I notice from receiving this medicine? Side effects that you should report to your doctor or health care professional as soon as possible: -allergic reactions like skin rash, itching or hives, swelling of the face, lips,  or tongue -back pain -breathing problems or shortness of breath -confusion -diarrhea -dry, nonproductive cough -low blood counts - this medicine may decrease the number of white blood cells, red blood cells and platelets. You may be at increased risk of infections and bleeding -mouth sores -redness, blistering, peeling or loosening of the skin, including inside the mouth -seizures -severe headaches -signs of infection - fever or chills, cough, sore throat, pain or difficulty passing urine -signs and symptoms of bleeding such as bloody or black, tarry stools; red or dark-brown urine; spitting up blood or brown material that looks like coffee grounds; red spots on the skin; unusual bruising or bleeding from the eye, gums, or nose -signs and symptoms of kidney injury like trouble passing urine or change in the amount of urine -signs and symptoms of liver injury like dark yellow or brown urine; general ill feeling or flu-like symptoms; light-colored stools; loss of appetite; nausea; right upper belly pain; unusually weak or tired; yellowing of the eyes or skin -stiff neck -vomiting Side effects that usually do not require medical attention (report to your doctor or health care professional if they continue or are bothersome): -dizziness -hair loss -headache -stomach pain -upset stomach This list may not describe all possible  side effects. Call your doctor for medical advice about side effects. You may report side effects to FDA at 1-800-FDA-1088. Where should I keep my medicine? If you are using this medicine at home, you will be instructed on how to store this medicine. Throw away any unused medicine after the expiration date on the label. NOTE: This sheet is a summary. It may not cover all possible information. If you have questions about this medicine, talk to your doctor, pharmacist, or health care provider.  2018 Elsevier/Gold Standard (2014-08-14 12:36:41)  Fluorouracil, 5-FU  injection (Adrucil) What is this medicine? FLUOROURACIL, 5-FU (flure oh YOOR a sil) is a chemotherapy drug. It slows the growth of cancer cells. This medicine is used to treat many types of cancer like breast cancer, colon or rectal cancer, pancreatic cancer, and stomach cancer. This medicine may be used for other purposes; ask your health care provider or pharmacist if you have questions. COMMON BRAND NAME(S): Adrucil What should I tell my health care provider before I take this medicine? They need to know if you have any of these conditions: -blood disorders -dihydropyrimidine dehydrogenase (DPD) deficiency -infection (especially a virus infection such as chickenpox, cold sores, or herpes) -kidney disease -liver disease -malnourished, poor nutrition -recent or ongoing radiation therapy -an unusual or allergic reaction to fluorouracil, other chemotherapy, other medicines, foods, dyes, or preservatives -pregnant or trying to get pregnant -breast-feeding How should I use this medicine? This drug is given as an infusion or injection into a vein. It is administered in a hospital or clinic by a specially trained health care professional. Talk to your pediatrician regarding the use of this medicine in children. Special care may be needed. Overdosage: If you think you have taken too much of this medicine contact a poison control center or emergency room at once. NOTE: This medicine is only for you. Do not share this medicine with others. What if I miss a dose? It is important not to miss your dose. Call your doctor or health care professional if you are unable to keep an appointment. What may interact with this medicine? -allopurinol -cimetidine -dapsone -digoxin -hydroxyurea -leucovorin -levamisole -medicines for seizures like ethotoin, fosphenytoin, phenytoin -medicines to increase blood counts like filgrastim, pegfilgrastim, sargramostim -medicines that treat or prevent blood clots  like warfarin, enoxaparin, and dalteparin -methotrexate -metronidazole -pyrimethamine -some other chemotherapy drugs like busulfan, cisplatin, estramustine, vinblastine -trimethoprim -trimetrexate -vaccines Talk to your doctor or health care professional before taking any of these medicines: -acetaminophen -aspirin -ibuprofen -ketoprofen -naproxen This list may not describe all possible interactions. Give your health care provider a list of all the medicines, herbs, non-prescription drugs, or dietary supplements you use. Also tell them if you smoke, drink alcohol, or use illegal drugs. Some items may interact with your medicine. What should I watch for while using this medicine? Visit your doctor for checks on your progress. This drug may make you feel generally unwell. This is not uncommon, as chemotherapy can affect healthy cells as well as cancer cells. Report any side effects. Continue your course of treatment even though you feel ill unless your doctor tells you to stop. In some cases, you may be given additional medicines to help with side effects. Follow all directions for their use. Call your doctor or health care professional for advice if you get a fever, chills or sore throat, or other symptoms of a cold or flu. Do not treat yourself. This drug decreases your body's ability to fight infections. Try to avoid  being around people who are sick. This medicine may increase your risk to bruise or bleed. Call your doctor or health care professional if you notice any unusual bleeding. Be careful brushing and flossing your teeth or using a toothpick because you may get an infection or bleed more easily. If you have any dental work done, tell your dentist you are receiving this medicine. Avoid taking products that contain aspirin, acetaminophen, ibuprofen, naproxen, or ketoprofen unless instructed by your doctor. These medicines may hide a fever. Do not become pregnant while taking this  medicine. Women should inform their doctor if they wish to become pregnant or think they might be pregnant. There is a potential for serious side effects to an unborn child. Talk to your health care professional or pharmacist for more information. Do not breast-feed an infant while taking this medicine. Men should inform their doctor if they wish to father a child. This medicine may lower sperm counts. Do not treat diarrhea with over the counter products. Contact your doctor if you have diarrhea that lasts more than 2 days or if it is severe and watery. This medicine can make you more sensitive to the sun. Keep out of the sun. If you cannot avoid being in the sun, wear protective clothing and use sunscreen. Do not use sun lamps or tanning beds/booths. What side effects may I notice from receiving this medicine? Side effects that you should report to your doctor or health care professional as soon as possible: -allergic reactions like skin rash, itching or hives, swelling of the face, lips, or tongue -low blood counts - this medicine may decrease the number of white blood cells, red blood cells and platelets. You may be at increased risk for infections and bleeding. -signs of infection - fever or chills, cough, sore throat, pain or difficulty passing urine -signs of decreased platelets or bleeding - bruising, pinpoint red spots on the skin, black, tarry stools, blood in the urine -signs of decreased red blood cells - unusually weak or tired, fainting spells, lightheadedness -breathing problems -changes in vision -chest pain -mouth sores -nausea and vomiting -pain, swelling, redness at site where injected -pain, tingling, numbness in the hands or feet -redness, swelling, or sores on hands or feet -stomach pain -unusual bleeding Side effects that usually do not require medical attention (report to your doctor or health care professional if they continue or are bothersome): -changes in finger or  toe nails -diarrhea -dry or itchy skin -hair loss -headache -loss of appetite -sensitivity of eyes to the light -stomach upset -unusually teary eyes This list may not describe all possible side effects. Call your doctor for medical advice about side effects. You may report side effects to FDA at 1-800-FDA-1088. Where should I keep my medicine? This drug is given in a hospital or clinic and will not be stored at home. NOTE: This sheet is a summary. It may not cover all possible information. If you have questions about this medicine, talk to your doctor, pharmacist, or health care provider.  2018 Elsevier/Gold Standard (2007-08-29 13:53:16)

## 2017-04-06 NOTE — Op Note (Signed)
Preoperative diagnosis: stage IV breast cancer Postoperative diagnosis: same as above Procedure: right ij US guided powerport insertion Surgeon: Dr Serita Grammes EBL: minimal Anes: general  Specimens none Complications none Drains none Sponge count correct Dispo to pacu stable  Indications: [name]   Procedure: After informed consent was obtained the patient was taken to the operating room. She was given antibiotics. Sequential compression devices were on her legs. She was then placed under general anesthesia with an LMA. Then she was prepped and draped in the standard sterile surgical fashion. Surgical timeout was then performed.  I used the ultrasound to identify the right internal jugular vein. I then accessed the vein using the ultrasound.This aspirated blood. I then placed the wire. This was confirmed by fluoroscopy and ultrasound to be in the correct position. I created a pocket on the right chest removing the old scar.  I tunneled the line between the 2 sites. I then dilated the tract and placed the dilator assembly with the sheath. This was done under fluoroscopy. I then removed the sheath and dilator. The wire was also removed. The line was then pulled back to be in the venacava. I hooked this up to the port. I sutured this into place with 2-0 Prolene in 2 places. This aspirated blood and flushed easily.This was confirmed with a final fluoroscopy. I then closed this with 2-0 Vicryl and 4-0 Monocryl. I accessed the port with the access kit.  This withdrew blood and I placed hpearin in it. She receives chemotherapy today. Dermabond was placed on both the incisions.A dressing was placed. She tolerated this well and was transferred to the recovery room in stable condition

## 2017-04-07 ENCOUNTER — Encounter (HOSPITAL_COMMUNITY): Payer: Self-pay | Admitting: General Surgery

## 2017-04-08 ENCOUNTER — Other Ambulatory Visit: Payer: Self-pay | Admitting: Oncology

## 2017-04-08 DIAGNOSIS — M79605 Pain in left leg: Secondary | ICD-10-CM

## 2017-04-10 ENCOUNTER — Telehealth: Payer: Self-pay

## 2017-04-10 ENCOUNTER — Encounter (HOSPITAL_COMMUNITY): Payer: Self-pay | Admitting: Emergency Medicine

## 2017-04-10 DIAGNOSIS — E876 Hypokalemia: Secondary | ICD-10-CM | POA: Diagnosis not present

## 2017-04-10 DIAGNOSIS — M79605 Pain in left leg: Secondary | ICD-10-CM | POA: Diagnosis not present

## 2017-04-10 DIAGNOSIS — C799 Secondary malignant neoplasm of unspecified site: Secondary | ICD-10-CM | POA: Insufficient documentation

## 2017-04-10 DIAGNOSIS — I10 Essential (primary) hypertension: Secondary | ICD-10-CM | POA: Diagnosis not present

## 2017-04-10 DIAGNOSIS — Z79899 Other long term (current) drug therapy: Secondary | ICD-10-CM | POA: Insufficient documentation

## 2017-04-10 NOTE — Telephone Encounter (Signed)
Pt called that her pain is better under control except spasms in left knee. The spasms will keep her awake at night. There is no swelling in the leg. She has been taking her potassium, she has tried the dilaudid but it made her throw up. The dilaudid was placed on allergy list. They have tried arthritis muscle rub, mustard, calcium in her multivitamin, heat, ice, rubbing on it. None of these are helping. This happens frequently. Husband can feel her muscles "pumping". Her mother describes it as a stone in her thigh.  Is there anything else they can do? Would PT help?

## 2017-04-10 NOTE — Telephone Encounter (Signed)
Lindsay [name] also stated that [facility] is still calling them to deliver chemo and the chemo has been cancelled.

## 2017-04-10 NOTE — ED Triage Notes (Signed)
Patient c/o left leg spasms x3 days. Hx stage 4 breast cancer. Reports last chemo was on Thursday. Reports she has been unable to sleep for three nights because she "can't get comfortable." Denies chest pain and SOB.

## 2017-04-11 ENCOUNTER — Telehealth: Payer: Self-pay

## 2017-04-11 ENCOUNTER — Emergency Department (HOSPITAL_COMMUNITY)
Admission: EM | Admit: 2017-04-11 | Discharge: 2017-04-11 | Disposition: A | Discharge status: Home / Self Care | Payer: BLUE CROSS/BLUE SHIELD | Attending: Emergency Medicine | Admitting: Emergency Medicine

## 2017-04-11 ENCOUNTER — Other Ambulatory Visit: Payer: Self-pay

## 2017-04-11 DIAGNOSIS — C799 Secondary malignant neoplasm of unspecified site: Secondary | ICD-10-CM

## 2017-04-11 DIAGNOSIS — E876 Hypokalemia: Secondary | ICD-10-CM

## 2017-04-11 DIAGNOSIS — M79605 Pain in left leg: Secondary | ICD-10-CM

## 2017-04-11 LAB — CBC WITH DIFFERENTIAL/PLATELET
BASOS PCT: 0 %
Basophils Absolute: 0 10*3/uL (ref 0.0–0.1)
EOS ABS: 0 10*3/uL (ref 0.0–0.7)
Eosinophils Relative: 0 %
HCT: 26.6 % — ABNORMAL LOW (ref 36.0–46.0)
Hemoglobin: 9.1 g/dL — ABNORMAL LOW (ref 12.0–15.0)
Lymphocytes Relative: 22 %
Lymphs Abs: 0.4 10*3/uL — ABNORMAL LOW (ref 0.7–4.0)
MCH: 28.2 pg (ref 26.0–34.0)
MCHC: 34.2 g/dL (ref 30.0–36.0)
MCV: 82.4 fL (ref 78.0–100.0)
MONO ABS: 0 10*3/uL — AB (ref 0.1–1.0)
MONOS PCT: 2 %
NEUTROS PCT: 76 %
Neutro Abs: 1.3 10*3/uL — ABNORMAL LOW (ref 1.7–7.7)
PLATELETS: 157 10*3/uL (ref 150–400)
RBC: 3.23 MIL/uL — ABNORMAL LOW (ref 3.87–5.11)
RDW: 14 % (ref 11.5–15.5)
WBC: 1.7 10*3/uL — ABNORMAL LOW (ref 4.0–10.5)

## 2017-04-11 LAB — BASIC METABOLIC PANEL
Anion gap: 13 (ref 5–15)
BUN: 22 mg/dL — ABNORMAL HIGH (ref 6–20)
CALCIUM: 6.6 mg/dL — AB (ref 8.9–10.3)
CO2: 23 mmol/L (ref 22–32)
CREATININE: 0.67 mg/dL (ref 0.44–1.00)
Chloride: 102 mmol/L (ref 101–111)
GFR calc non Af Amer: >60 mL/min (ref >60)
Glucose, Bld: 88 mg/dL (ref 65–99)
Potassium: 3.2 mmol/L — ABNORMAL LOW (ref 3.5–5.1)
SODIUM: 138 mmol/L (ref 135–145)

## 2017-04-11 MED ORDER — ONDANSETRON HCL 4 MG/2ML IJ SOLN
4.0000 mg | Freq: Once | INTRAMUSCULAR | Status: AC
  Administered 2017-04-11: 4 mg via INTRAVENOUS
  Filled 2017-04-11: qty 2

## 2017-04-11 MED ORDER — FENTANYL CITRATE (PF) 100 MCG/2ML IJ SOLN
50.0000 ug | INTRAMUSCULAR | Status: DC | PRN
  Administered 2017-04-11: 50 ug via INTRAVENOUS
  Filled 2017-04-11: qty 2

## 2017-04-11 MED ORDER — POTASSIUM CHLORIDE CRYS ER 20 MEQ PO TBCR
40.0000 meq | EXTENDED_RELEASE_TABLET | Freq: Once | ORAL | Status: AC
  Administered 2017-04-11: 40 meq via ORAL
  Filled 2017-04-11: qty 2

## 2017-04-11 MED ORDER — FENTANYL CITRATE (PF) 100 MCG/2ML IJ SOLN
100.0000 ug | Freq: Once | INTRAMUSCULAR | Status: AC
  Administered 2017-04-11: 100 ug via INTRAVENOUS
  Filled 2017-04-11: qty 2

## 2017-04-11 MED ORDER — FENTANYL 25 MCG/HR TD PT72: 25.0000 ug | MEDICATED_PATCH | TRANSDERMAL | 0 refills | Status: DC

## 2017-04-11 MED ORDER — HEPARIN SOD (PORK) LOCK FLUSH 100 UNIT/ML IV SOLN
500.0000 [IU] | Freq: Once | INTRAVENOUS | Status: AC
  Administered 2017-04-11: 500 [IU]
  Filled 2017-04-11: qty 5

## 2017-04-11 MED ORDER — IBUPROFEN 800 MG PO TABS: 800.0000 mg | ORAL_TABLET | Freq: Three times a day (TID) | ORAL | 0 refills | Status: DC

## 2017-04-11 NOTE — ED Provider Notes (Addendum)
[facility] DEPT Provider Note: Lindsay Spurling, MD, FACEP  CSN: [id number] MRN: [id number] ARRIVAL: 04/10/17 at [place]: [place]  Leg Pain   HISTORY OF PRESENT ILLNESS  04/11/17 3:59 AM Lindsay [name] Lindsay [name] is a 51 y.o. female with widely metastatic breast cancer including bony metastases. She has a long-standing history of pain in her left anterior thigh that has acutely worsened over the past 3 days.  She describes the pain as severe and feels like a muscle spasm.  Sometimes she has a sensation of not being in that muscle.  The muscle is not tender to palpation but pain is worse when she lies supine.  The pain is been severe enough to prevent her from sleeping for the past 3 nights.  She has taken Dilaudid without relief of the pain.  Dilaudid also makes her nauseated.  She recently had x-rays of the left femur which showed no acute bony abnormality and some osteoarthritis of the knee.  On October 8 she had plain films of the lumbar spine and pelvis which showed no evidence of bony disease.   Past Medical History:  Diagnosis Date  . Allergy    seasonal  . Arthritis   . Breast cancer of upper-outer quadrant of right female breast ([place]) 12/16/2015  . Depression    mild depression after death of daughter- no meds  . History of radiation therapy 06/20/17-08/04/16   right breast 50.4 gy in 28 fractions, boost 10 Gy in 5 fractions  . Hot flashes   . Hx of adenomatous colonic polyps 04/25/2014  . Hypertension     Past Surgical History:  Procedure Laterality Date  . ABDOMINAL HYSTERECTOMY    . BREAST LUMPECTOMY WITH RADIOACTIVE SEED AND SENTINEL LYMPH NODE BIOPSY Right 05/05/2016   Procedure: BREAST LUMPECTOMY WITH RADIOACTIVE SEED AND SENTINEL LYMPH NODE BIOPSY;  Surgeon: Rolm Bookbinder, MD;  Location: [facility];  Service: General;  Laterality: Right;  . CHOLECYSTECTOMY N/A 09/09/2015   Procedure: LAPAROSCOPIC CHOLECYSTECTOMY;  Surgeon: Ralene Ok, MD;  Location: [facility];  Service: General;  Laterality: N/A;  . COLONOSCOPY    . KNEE ARTHROSCOPY  2016  . PERIPHERAL VASCULAR CATHETERIZATION Right 05/05/2016   Procedure: PORTA CATH REMOVAL;  Surgeon: Rolm Bookbinder, MD;  Location: [place] [facility];  Service: General;  Laterality: Right;  . PORTACATH PLACEMENT Right 12/29/2015   Procedure: INSERTION PORT-A-CATH WITH ULTRASOUND GUIDANCE;  Surgeon: Rolm Bookbinder, MD;  Location: [place];  Service: General;  Laterality: Right;  . PORTACATH PLACEMENT Right 04/06/2017   Procedure: INSERTION PORT-A-CATH WITH [place];  Surgeon: Rolm Bookbinder, MD;  Location: [facility];  Service: General;  Laterality: Right;    Family History  Problem Relation Age of Onset  . Hypertension Mother   . Breast cancer Maternal Grandfather   . Breast cancer Other   . Breast cancer Cousin   . Sudden death Neg Hx   . Diabetes Neg Hx   . Heart attack Neg Hx   . Hyperlipidemia Neg Hx   . Colon cancer Neg Hx   . Esophageal cancer Neg Hx   . Rectal cancer Neg Hx   . Stomach cancer Neg Hx     Social History   Tobacco Use  . Smoking status: Never Smoker  . Smokeless tobacco: Never Used  Substance Use Topics  . Alcohol use: No    Alcohol/week: 0.0 oz  . Drug use: No    Prior to Admission medications   Medication  Sig Start Date End Date Taking? Authorizing Provider  ALPRAZolam Duanne Moron) 0.5 MG tablet Take 0.5 mg by mouth daily as needed for anxiety or sleep.  03/02/17  Yes [provider]  dexamethasone (DECADRON) 4 MG tablet Take 2 tablets (8 mg total) by mouth daily. Start the day after chemotherapy for 2 days. Take with food. 04/06/17  Yes Magrinat, Virgie Dad, MD  EPINEPHrine (EPIPEN 2-PAK) 0.3 mg/0.3 mL IJ SOAJ injection Inject 0.3 mg into the muscle once as needed (anaphylaxis allergic reaction).  01/01/14  Yes [provider]  fexofenadine (ALLEGRA) 180 MG tablet Take 180 mg by mouth daily as needed (for allergies.).    Yes  [provider]  HYDROmorphone (DILAUDID) 2 MG tablet Take 1-2 tablets (2-4 mg total) by mouth every 4 (four) hours as needed for severe pain. 04/06/17  Yes Magrinat, Virgie Dad, MD  ibuprofen (ADVIL,MOTRIN) 800 MG tablet Take 1 tablet (800 mg total) 3 (three) times daily by mouth. Patient taking differently: Take 800 mg by mouth 3 (three) times daily as needed (for pain.).  03/28/17  Yes Magrinat, Virgie Dad, MD  Ibuprofen-Diphenhydramine HCl (ADVIL PM) 200-25 MG CAPS Take 1 tablet by mouth at bedtime as needed (sleep).   Yes [provider]  lidocaine-prilocaine (EMLA) cream Apply to affected area once 04/06/17  Yes Magrinat, Virgie Dad, MD  lisinopril-hydrochlorothiazide (PRINZIDE,ZESTORETIC) 20-12.5 MG per tablet Take 1 tablet by mouth daily.   Yes [provider]  NON FORMULARY 1 Syringe every 7 (seven) days. Allergy Shot   Yes [provider]  ondansetron (ZOFRAN) 8 MG tablet Take 1 tablet (8 mg total) by mouth 2 (two) times daily as needed for refractory nausea / vomiting. Start on day 3 after chemotherapy. 04/06/17  Yes Magrinat, Virgie Dad, MD  prochlorperazine (COMPAZINE) 10 MG tablet Take 1 tablet (10 mg total) every 6 (six) hours as needed by mouth (Nausea or vomiting). 03/24/17  Yes Magrinat, Virgie Dad, MD  venlafaxine XR (EFFEXOR-XR) 75 MG 24 hr capsule Take 75 mg by mouth daily. 03/17/17  Yes [provider]  potassium chloride SA (K-DUR,KLOR-CON) 20 MEQ tablet Take 1 tablet (20 mEq total) by mouth once for 1 dose. 04/06/17 04/06/17  Magrinat, Virgie Dad, MD    Allergies Gabapentin; Dilaudid [hydromorphone hcl]; Percocet [oxycodone-acetaminophen]; Pollen extract; Tramadol; and Vicodin [hydrocodone-acetaminophen]   REVIEW OF SYSTEMS  Negative except as noted here or in the History of Present Illness.   PHYSICAL EXAMINATION  Initial Vital Signs Blood pressure 124/85, pulse (!) 105, temperature 98.6 F (37 C), temperature source Oral, resp. rate  18, SpO2 99 %.  Examination General: Well-developed, well-nourished female in no acute distress; appearance consistent with age of record HENT: normocephalic; atraumatic Eyes: pupils equal, round and reactive to light; extraocular muscles intact Neck: supple Heart: regular rate and rhythm Lungs: clear to auscultation bilaterally Abdomen: soft; nondistended; nontender; bowel sounds present Extremities: No deformity; full range of motion except left hip limited by pain; pulses normal; left anterior thigh nontender and without palpable muscle spasm or mass Neurologic: Awake, alert; motor function intact in all extremities and symmetric; no facial droop Skin: Warm and dry Psychiatric: Tearful   RESULTS  Summary of this visit's results, reviewed by myself:   EKG Interpretation  Date/Time:    Ventricular Rate:    PR Interval:    QRS Duration:   QT Interval:    QTC Calculation:   R Axis:     Text Interpretation:  Laboratory Studies: Results for orders placed or performed during the hospital encounter of 04/11/17 (from the past 24 hour(s))  CBC with Differential/Platelet     Status: Abnormal   Collection Time: 04/11/17  4:26 AM  Result Value Ref Range   WBC 1.7 (L) 4.0 - 10.5 K/uL   RBC 3.23 (L) 3.87 - 5.11 MIL/uL   Hemoglobin 9.1 (L) 12.0 - 15.0 g/dL   HCT 26.6 (L) 36.0 - 46.0 %   MCV 82.4 78.0 - 100.0 fL   MCH 28.2 26.0 - 34.0 pg   MCHC 34.2 30.0 - 36.0 g/dL   RDW 14.0 11.5 - 15.5 %   Platelets 157 150 - 400 K/uL   Neutrophils Relative % 76 %   Neutro Abs 1.3 (L) 1.7 - 7.7 K/uL   Lymphocytes Relative 22 %   Lymphs Abs 0.4 (L) 0.7 - 4.0 K/uL   Monocytes Relative 2 %   Monocytes Absolute 0.0 (L) 0.1 - 1.0 K/uL   Eosinophils Relative 0 %   Eosinophils Absolute 0.0 0.0 - 0.7 K/uL   Basophils Relative 0 %   Basophils Absolute 0.0 0.0 - 0.1 K/uL  Basic metabolic panel     Status: Abnormal   Collection Time: 04/11/17  4:26 AM  Result Value Ref Range   Sodium 138  135 - 145 mmol/L   Potassium 3.2 (L) 3.5 - 5.1 mmol/L   Chloride 102 101 - 111 mmol/L   CO2 23 22 - 32 mmol/L   Glucose, Bld 88 65 - 99 mg/dL   BUN 22 (H) 6 - 20 mg/dL   Creatinine, Ser 0.67 0.44 - 1.00 mg/dL   Calcium 6.6 (L) 8.9 - 10.3 mg/dL   GFR calc non Af Amer >60 >60 mL/min   GFR calc Af Amer >60 >60 mL/min   Anion gap 13 5 - 15   Imaging Studies: No results found.  ED COURSE  Nursing notes and initial vitals signs, including pulse oximetry, reviewed.  Vitals:   04/10/17 2256 04/11/17 0410  BP: 124/85 118/76  Pulse: (!) 105 (!) 107  Resp: 18 16  Temp: 98.6 F (37 C)   TempSrc: Oral   SpO2: 99% 100%   5:50 AM Pain down to a 6 out of 10 from 10 out of 10 after 100 mcg of fentanyl IV.  Patient is usually intolerant to most narcotics.  That she got good relief from fentanyl without induction of nausea and vomiting is reassuring.  She and her husband were advised that as an ED physician I am not legally permitted to prescribe long acting narcotics, namely fentanyl patches.  She was advised to contact her oncologist this morning and inquire about being transferred from her oral pain medications to fentanyl patches.  PROCEDURES    ED DIAGNOSES     ICD-10-CM   1. Left leg pain M79.605   2. Metastatic cancer ([place]) C79.9   3. Hypokalemia E87.6        Mandie Crabbe, Jenny Reichmann, MD 04/11/17 4259    Shanon Rosser, MD 04/11/17 [phone]

## 2017-04-11 NOTE — Telephone Encounter (Signed)
Husband called that they went to [facility] ER last night. Pt was given injection of fentanyl. It helped her knee pain tremendously. The ER MD said she needs an rx for fentanyl but he could not give them the rx. ER instructed husband to call Dr Jana Hakim and request a fentanyl rx.

## 2017-04-11 NOTE — ED Notes (Signed)
Writer went to obtain a new set of vital signs but EDP is at bedside. Will return to obtain new vital signs when provider has left bedside.

## 2017-04-11 NOTE — ED Notes (Signed)
RN obtaining bloodwork from port, when accessed. Patient does not want to be stuck.

## 2017-04-11 NOTE — Telephone Encounter (Signed)
Spoke with spouse by phone to let him know fentanyl prescription is ready to be picked up.  Prescription has been placed in binder in Triage.

## 2017-04-11 NOTE — ED Notes (Signed)
Bed: ID78 Expected date:  Expected time:  Means of arrival:  Comments: TR5

## 2017-04-12 NOTE — Progress Notes (Addendum)
[place]  [phone] [phone]     ID: Lindsay [name] DOB: 08/08/1965  MR#: [id number]  UTM#:[id number]  Patient Care Team: Bernerd Limbo, MD as PCP - General (Family Medicine) Rolm Bookbinder, MD as Consulting Physician (General Surgery) Magrinat, Virgie Dad, MD as Consulting Physician (Oncology) Gery Pray, MD as Consulting Physician (Radiation Oncology) Newton Pigg, MD as Consulting Physician (Obstetrics and Gynecology) Dorna Leitz, MD as Consulting Physician (Orthopedic Surgery) Delice Bison, Charlestine Massed, NP as Nurse Practitioner (Hematology and Oncology) OTHER MD:  CHIEF COMPLAINT: Triple negative stage IV breast cancer  CURRENT TREATMENT: CMF,  denosumab/Xgeva   BREAST CANCER HISTORY: From the original intake note:  Lindsay [name] had routine screening mammography with tomography at [facility] 12/04/2015. There was a new mass measuring 1.5 cm at the 11:00 position of the right breast. Right breast ultrasonography 12/10/2015 confirmed a 1.9 cm irregular mass in the upper-outer quadrant of the right breast posteriorly. There was a right axillary lymph node with focal cortical thickening.  On 12/14/2015 Lindsay [name] underwent biopsy of the right breast mass at 9:00 position as well as a suspicious lymph node. A separate area in the right breast 10 cm from the nipple was also biopsied biopsied. The final pathology ([id number]) showed the additional area and the lymph node to be benign. The 9:00 mass however was positive for invasive ductal carcinoma, grade 3, estrogen receptor and progesterone receptor negative, with an MIB-1 of 70%, and HER-2 nonamplified with a signals ratio 1.44, and the number per cell 2.70.  Her subsequent history is as detailed below  INTERVAL HISTORY: Lindsay [name] returns today for follow-up and treatment of her metastatic triple negative breast cancer. She is accompanied by her motherand husband. Today is day 8 cycle 1 of CMF, to be  repeated every 21 days.  Lindsay [name] is not feeling well today.  She was in the ER a couple of days ago for pain.  She was started on a Fentanyl patch afterwards, and her pain is much improved.    REVIEW OF SYSTEMS: Lindsay [name] has continued to have issues with weakness.  She is unable to stand.  She is drinking, but is unable to eat. Most everything she eats, she vomits.  She requires 24 hour around the clock care.  Her husband and her mother in law help care for her.  She requires assistance in every activity of daily living other than feeding herself.  She did not have labs done today.  She is tearful, and not feeling well.  She is having regular bowel movements.    PAST MEDICAL HISTORY: Past Medical History:  Diagnosis Date  . Allergy    seasonal  . Arthritis   . Breast cancer of upper-outer quadrant of right female breast ([place]) 12/16/2015  . Depression    mild depression after death of daughter- no meds  . History of radiation therapy 06/20/17-08/04/16   right breast 50.4 gy in 28 fractions, boost 10 Gy in 5 fractions  . Hot flashes   . Hx of adenomatous colonic polyps 04/25/2014  . Hypertension     PAST SURGICAL HISTORY: Past Surgical History:  Procedure Laterality Date  . ABDOMINAL HYSTERECTOMY    . BREAST LUMPECTOMY WITH RADIOACTIVE SEED AND SENTINEL LYMPH NODE BIOPSY Right 05/05/2016   Procedure: BREAST LUMPECTOMY WITH RADIOACTIVE SEED AND SENTINEL LYMPH NODE BIOPSY;  Surgeon: Rolm Bookbinder, MD;  Location: [place];  Service: General;  Laterality: Right;  . CHOLECYSTECTOMY N/A 09/09/2015   Procedure: LAPAROSCOPIC CHOLECYSTECTOMY;  Surgeon: Ralene Ok, MD;  Location: [facility];  Service: General;  Laterality: N/A;  . COLONOSCOPY    . KNEE ARTHROSCOPY  2016  . PERIPHERAL VASCULAR CATHETERIZATION Right 05/05/2016   Procedure: PORTA CATH REMOVAL;  Surgeon: Rolm Bookbinder, MD;  Location: [place];  Service: General;  Laterality: Right;  . PORTACATH  PLACEMENT Right 12/29/2015   Procedure: INSERTION PORT-A-CATH WITH ULTRASOUND GUIDANCE;  Surgeon: Rolm Bookbinder, MD;  Location: [place];  Service: General;  Laterality: Right;  . PORTACATH PLACEMENT Right 04/06/2017   Procedure: INSERTION PORT-A-CATH WITH [place];  Surgeon: Rolm Bookbinder, MD;  Location: [place];  Service: General;  Laterality: Right;    FAMILY HISTORY Family History  Problem Relation Age of Onset  . Hypertension Mother   . Breast cancer Maternal Grandfather   . Breast cancer Other   . Breast cancer Cousin   . Sudden death Neg Hx   . Diabetes Neg Hx   . Heart attack Neg Hx   . Hyperlipidemia Neg Hx   . Colon cancer Neg Hx   . Esophageal cancer Neg Hx   . Rectal cancer Neg Hx   . Stomach cancer Neg Hx   The patient's father was murdered at age 24. The patient's mother is 73 years old as of August 2017. The patient has one brother, 2 sisters. A cousin was diagnosed with left breast cancer at age 72. A maternal aunt and a maternal grandmother were also diagnosed with breast cancer at age 52 and 83 respectively.  GYNECOLOGIC HISTORY:  No LMP recorded. Patient has had a hysterectomy.  Menarche age 20, first live birth age 47, the patient is GX P3. She is status post hysterectomy without salpingo-oophorectomy. She did not take hormone replacement. She used oral contraceptives remotely without complications.  SOCIAL HISTORY:  Lindsay [name] worked as an Engineer, production at [employer].  She is now applying for disability.  Her husband Lindsay [name] is a news and record Lexicographer. Son Lindsay [name] lives in [place] and is a Games developer. Son Lindsay [name] lives in [place] and is a Architectural technologist. The patient had a daughter who died at the age of 71.   ADVANCED DIRECTIVES: Not in place   HEALTH MAINTENANCE: Social History   Tobacco Use  . Smoking status: Never Smoker  . Smokeless tobacco: Never Used  Substance Use Topics  . Alcohol use: No    Alcohol/week: 0.0 oz  . Drug use:  No     Colonoscopy: 2016  PAP:  Bone density: Never   Allergies  Allergen Reactions  . Gabapentin Other (See Comments)    "light headed, dizziness, didn't like the way it made me feel"  . Dilaudid [Hydromorphone Hcl] Nausea And Vomiting  . Percocet [Oxycodone-Acetaminophen] Diarrhea and Nausea And Vomiting  . Pollen Extract Other (See Comments)    Runny nose, itchy eyes and sneezing due to seasonal allergies.  . Tramadol Nausea And Vomiting  . Vicodin [Hydrocodone-Acetaminophen] Nausea And Vomiting    Current Outpatient Medications  Medication Sig Dispense Refill  . ALPRAZolam (XANAX) 0.5 MG tablet Take 0.5 mg by mouth daily as needed for anxiety or sleep.   0  . dexamethasone (DECADRON) 4 MG tablet Take 2 tablets (8 mg total) by mouth daily. Start the day after chemotherapy for 2 days. Take with food. 30 tablet 1  . EPINEPHrine (EPIPEN 2-PAK) 0.3 mg/0.3 mL IJ SOAJ injection Inject 0.3 mg into the muscle once as needed (anaphylaxis allergic reaction).     Marland Kitchen  fentaNYL (DURAGESIC - DOSED MCG/HR) 25 MCG/HR patch Place 1 patch (25 mcg total) onto the skin every 3 (three) days. 5 patch 0  . fexofenadine (ALLEGRA) 180 MG tablet Take 180 mg by mouth daily as needed (for allergies.).     Marland Kitchen ibuprofen (ADVIL,MOTRIN) 800 MG tablet Take 1 tablet (800 mg total) by mouth 3 (three) times daily. 21 tablet 0  . Ibuprofen-Diphenhydramine HCl (ADVIL PM) 200-25 MG CAPS Take 1 tablet by mouth at bedtime as needed (sleep).    Marland Kitchen lidocaine-prilocaine (EMLA) cream Apply to affected area once 30 g 3  . lisinopril-hydrochlorothiazide (PRINZIDE,ZESTORETIC) 20-12.5 MG per tablet Take 1 tablet by mouth daily.    . NON FORMULARY 1 Syringe every 7 (seven) days. Allergy Shot    . ondansetron (ZOFRAN) 8 MG tablet Take 1 tablet (8 mg total) by mouth 2 (two) times daily as needed for refractory nausea / vomiting. Start on day 3 after chemotherapy. 30 tablet 1  . prochlorperazine (COMPAZINE) 10 MG tablet Take 1 tablet (10  mg total) every 6 (six) hours as needed by mouth (Nausea or vomiting). 30 tablet 1  . venlafaxine XR (EFFEXOR-XR) 75 MG 24 hr capsule Take 75 mg by mouth daily.  3  . HYDROmorphone (DILAUDID) 2 MG tablet Take 1-2 tablets (2-4 mg total) by mouth every 4 (four) hours as needed for severe pain. (Patient not taking: Reported on 04/13/2017) 120 tablet 0  . potassium chloride SA (K-DUR,KLOR-CON) 20 MEQ tablet Take 1 tablet (20 mEq total) by mouth once for 1 dose. 100 tablet 4   No current facility-administered medications for this visit.     OBJECTIVE:  Vitals:   04/13/17 0847  BP: 116/79  Pulse: (!) 101  Resp: 18  Temp: 99 F (37.2 C)  SpO2: 100%     Body mass index is 29.86 kg/m.    ECOG FS:3 GENERAL: Patient is a tired and chronically ill unwell appearing female in no acute distress in wheel chair exam limited HEENT:  Sclerae icteric.  Oropharynx clear and moist. No ulcerations or evidence of oropharyngeal candidiasis. Neck is supple.  NODES:  No cervical, supraclavicular, or axillary lymphadenopathy palpated.  BREAST EXAM:  Deferred. LUNGS:  Clear to auscultation bilaterally.  No wheezes or rhonchi. HEART:  Regular rate and rhythm. No murmur appreciated. ABDOMEN:  Soft, nontender.  Positive, normoactive bowel sounds. No organomegaly palpated. MSK:  No focal spinal tenderness to palpation.  EXTREMITIES:  No peripheral edema.   NEURO:  Nonfocal. Well oriented.  Flat affect.    LAB RESULTS:  CMP     Component Value Date/Time   NA 138 04/11/2017 0426   NA 141 04/06/2017 1327   K 3.2 (L) 04/11/2017 0426   K 3.0 Repeated and Verified (LL) 04/06/2017 1327   CL 102 04/11/2017 0426   CO2 23 04/11/2017 0426   CO2 24 04/06/2017 1327   GLUCOSE 88 04/11/2017 0426   GLUCOSE 110 04/06/2017 1327   BUN 22 (H) 04/11/2017 0426   BUN 18.0 04/06/2017 1327   CREATININE 0.67 04/11/2017 0426   CREATININE 0.8 04/06/2017 1327   CALCIUM 6.6 (L) 04/11/2017 0426   CALCIUM 7.3 (L) 04/06/2017 1327     PROT 7.6 04/06/2017 1327   ALBUMIN 3.8 04/06/2017 1327   AST 164 (H) 04/06/2017 1327   ALT 105 (H) 04/06/2017 1327   ALKPHOS 41 04/06/2017 1327   BILITOT 1.03 04/06/2017 1327   GFRNONAA >60 04/11/2017 0426   GFRAA >60 04/11/2017 0426    INo  results found for: SPEP, UPEP  Lab Results  Component Value Date   WBC 1.7 (L) 04/11/2017   NEUTROABS 1.3 (L) 04/11/2017   HGB 9.1 (L) 04/11/2017   HCT 26.6 (L) 04/11/2017   MCV 82.4 04/11/2017   PLT 157 04/11/2017      Chemistry      Component Value Date/Time   NA 138 04/11/2017 0426   NA 141 04/06/2017 1327   K 3.2 (L) 04/11/2017 0426   K 3.0 Repeated and Verified (LL) 04/06/2017 1327   CL 102 04/11/2017 0426   CO2 23 04/11/2017 0426   CO2 24 04/06/2017 1327   BUN 22 (H) 04/11/2017 0426   BUN 18.0 04/06/2017 1327   CREATININE 0.67 04/11/2017 0426   CREATININE 0.8 04/06/2017 1327      Component Value Date/Time   CALCIUM 6.6 (L) 04/11/2017 0426   CALCIUM 7.3 (L) 04/06/2017 1327   ALKPHOS 41 04/06/2017 1327   AST 164 (H) 04/06/2017 1327   ALT 105 (H) 04/06/2017 1327   BILITOT 1.03 04/06/2017 1327       No results found for: LABCA2  No components found for: [id number]  No results for input(s): INR in the last 168 hours.  Urinalysis    Component Value Date/Time   COLORURINE YELLOW 04/09/2016 1455   APPEARANCEUR CLOUDY (A) 04/09/2016 1455   LABSPEC 1.011 04/09/2016 1455   PHURINE 7.0 04/09/2016 1455   GLUCOSEU NEGATIVE 04/09/2016 1455   HGBUR NEGATIVE 04/09/2016 1455   BILIRUBINUR NEGATIVE 04/09/2016 1455   KETONESUR NEGATIVE 04/09/2016 1455   PROTEINUR NEGATIVE 04/09/2016 1455   UROBILINOGEN 1.0 03/03/2015 1240   NITRITE NEGATIVE 04/09/2016 1455   LEUKOCYTESUR NEGATIVE 04/09/2016 1455     STUDIES: Dg Chest Port 1 View  Result Date: 04/06/2017 CLINICAL DATA:  Port catheter placement. EXAM: PORTABLE CHEST 1 VIEW COMPARISON:  MRI abdomen dated March 01, 2017. Chest x-ray dated January 24, 2016. FINDINGS:  Right port catheter with the tip projecting over the proximal SVC. The cardiomediastinal silhouette is normal in size. Normal pulmonary vascularity. Increased reticulonodular interstitial lung markings in both lung bases. No pleural effusion or pneumothorax. No acute osseous abnormality. IMPRESSION: 1. Right port catheter with tip projecting over the proximal SVC. No pneumothorax. 2. Increased reticulonodular densities in both lower lobes, consistent with known pulmonary metastatic disease. Electronically Signed   By: Titus Dubin M.D.   On: 04/06/2017 09:26   Dg Fluoro Guide Cv Line-no Report  Result Date: 04/06/2017 Fluoroscopy was utilized by the requesting physician.  No radiographic interpretation.     ELIGIBLE FOR AVAILABLE RESEARCH PROTOCOL: considered the PREVENT trial: decided against it  ASSESSMENT: 51 y.o. [place] woman status post right breast upper outer quadrant biopsy 12/14/2015 for a clinical T1c N0, stage IA  invasive ductal carcinoma, grade 3, triple negative, with an MIB-1 of 70%  (a) this stages as IIB In the 2018 new Prognostic classification  (1) genetics testing negative for mutations within any of 20 genes on the Breast/Ovarian Cancer Panel through [facility] [employer] [facility].  Additionally, no VUS were found.   (2) neoadjuvant chemotherapy consisting of doxorubicin and cyclophosphamide in dose this fashion 4, completed 02/22/2016, followed by paclitaxel weekly 5 (of 12 treatments planned), starting 03/08/2016, stopped 04/05/2016 after cycle 5 because of neuropathy  (a) cycle 4 of cyclophosphamide and doxorubicin held 1 week because of side effects  (3) status post right lumpectomy and sentinel lymph node sampling 05/05/2016 for a residual ypT2 ypN) invasive ductal carcinoma, grade 3, the repeat prognostic panel  again triple negative the residual cancer burden was 2  (4) adjuvant radiation 06/20/16 - 08/04/16 : 50.4 Gy to the right breast plus a 10 Gy boost  (5) left  thyroid nodule biopsy 04/14/2016 read as [facility] 1  METASTATIC DISEASE: October 2018 (6) MRI of the abdomen March 01, 2017 shows multiple lesions in the lungs, liver, and bones  (a) the tumor is PD-L1 negative  (7) started capecitabine March 14, 2017; plan is to take 14 days on, 7 days off  (a) discontinued 03/19/2017 with multiple side effects  (8) started denosumab/Xgeva March 10, 2017, to be repeated every 28 days  (9) pain syndrome: currently on hydromorphone 2 mg Q4h PRN  (10) started CMF chemotherapy 04/06/2017, to be repeated every 21 days  (1) her tumor is PD-L1 negative (03/22/2017)   PLAN: Karishma is feeling unwell today.  I saw her in conjunction with Dr. Jana Hakim.  We encouraged her to drink more fluids, and eat small frequent meals.  We increased her Effexor to 131m per day.  We encouraged her to remain vertical during the day and not to lie in bed all day.  Her pain is controlled with Fentanyl patches, which she will continue.  Her blood pressure is 116/79, we instructed her to stop her Lisinopril/HCT.  She will work on this and I will see her back in clinic in one week for labs and follow up.  She and her husband verbalized understanding of this plan.  They know to call for any questions or concerns prior to her next appointment with u[place]     LWilber Bihari NP  04/13/17 9:20 AM Medical Oncology and Hematology [facility]AKe[address] [place] [id number]. [phone]   Fax. [phone]   ADDENDUM: TDyannewas very despondent today.  This despite the fact that her pain is actually better controlled since she was started on a fentanyl patch.  She has not use the hydromorphone at all in the last 24 hours.  She is managing to have bowel movements which are not terribly hard.  I think she is just very depressed.  We are going to increase her venlafaxine 250 mg daily.  Hopefully over the next couple of weeks this will help.  Her husband is  very concerned because her disability has not yet been approved and in fact he cannot even confirm that they have received the documents.  I strongly urged Zoelle to stay out of bed during the day, stay as vertical as she can, walking is better than sitting and sitting is better than otherwise I think we are making a little bit of progress.  She seems to have tolerated her first cycle of CMF well, and her pain is better controlled  She is going to see me again next week.  She is going to need a lot of support to get through her first few treatments at least until we can document that her cancer is actually being controlled..  I think that would be the very best medicine for her.  We offered her some fluids today but actually she thinks she is drinking well enough.  Her blood pressure however is borderline.  We are going to stop her lisinopril.  She will call with any other issues that may develop before the next visit.   I personally saw this patient and performed a substantive portion of this encounter with the listed APP documented above.   MChauncey Cruel MD Medical Oncology  and Hematology [facility] [address] [place], [place] [address] Tel. [phone]    Fax. [phone]

## 2017-04-13 ENCOUNTER — Encounter: Payer: Self-pay | Admitting: Adult Health

## 2017-04-13 ENCOUNTER — Ambulatory Visit (HOSPITAL_BASED_OUTPATIENT_CLINIC_OR_DEPARTMENT_OTHER): Payer: BLUE CROSS/BLUE SHIELD | Admitting: Adult Health

## 2017-04-13 ENCOUNTER — Telehealth: Payer: Self-pay | Admitting: Adult Health

## 2017-04-13 VITALS — BP 116/79 | HR 101 | Temp 99.0°F | Resp 18 | Ht 66.0 in

## 2017-04-13 DIAGNOSIS — C7800 Secondary malignant neoplasm of unspecified lung: Secondary | ICD-10-CM | POA: Diagnosis not present

## 2017-04-13 DIAGNOSIS — C50411 Malignant neoplasm of upper-outer quadrant of right female breast: Secondary | ICD-10-CM | POA: Diagnosis not present

## 2017-04-13 DIAGNOSIS — Z171 Estrogen receptor negative status [ER-]: Secondary | ICD-10-CM

## 2017-04-13 DIAGNOSIS — C787 Secondary malignant neoplasm of liver and intrahepatic bile duct: Secondary | ICD-10-CM | POA: Diagnosis not present

## 2017-04-13 DIAGNOSIS — C7951 Secondary malignant neoplasm of bone: Secondary | ICD-10-CM

## 2017-04-13 DIAGNOSIS — R531 Weakness: Secondary | ICD-10-CM | POA: Diagnosis not present

## 2017-04-13 DIAGNOSIS — C50919 Malignant neoplasm of unspecified site of unspecified female breast: Secondary | ICD-10-CM

## 2017-04-13 MED ORDER — VENLAFAXINE HCL ER 150 MG PO CP24: 150.0000 mg | ORAL_CAPSULE | Freq: Every day | ORAL | 5 refills | Status: DC

## 2017-04-13 NOTE — Telephone Encounter (Signed)
Patient declined AVs and calendar of upcoming December appointments. Will receive update in MyChart.

## 2017-04-19 ENCOUNTER — Telehealth: Payer: Self-pay | Admitting: *Deleted

## 2017-04-19 NOTE — Telephone Encounter (Signed)
This RN spoke with pt's husband per his call stating need " for assistive devices to help in the home ".  Per discussion - Tianna is unable to stand well to get around home - needs assistance in the shower / bathing too.  This RN discussed above including pt is scheduled for visit tomorrow - above can be assessed better for possible home PT/OT as well as DME neeeds. A referral to [facility] can be placed while pt is here and the [facility] nurse at [facility] may be able to speak with him regarding products and any cost concerns.  Mr Kehres appreciated above discussion - this note will be forwarded to Wilber Bihari per scheduled visit tomorrow.

## 2017-04-20 ENCOUNTER — Other Ambulatory Visit (HOSPITAL_BASED_OUTPATIENT_CLINIC_OR_DEPARTMENT_OTHER): Payer: BLUE CROSS/BLUE SHIELD

## 2017-04-20 ENCOUNTER — Ambulatory Visit (HOSPITAL_BASED_OUTPATIENT_CLINIC_OR_DEPARTMENT_OTHER): Payer: BLUE CROSS/BLUE SHIELD | Admitting: Adult Health

## 2017-04-20 ENCOUNTER — Telehealth: Payer: Self-pay | Admitting: Oncology

## 2017-04-20 ENCOUNTER — Encounter: Payer: Self-pay | Admitting: Adult Health

## 2017-04-20 VITALS — BP 132/75 | HR 107 | Temp 98.8°F | Resp 18 | Ht 66.0 in | Wt 170.1 lb

## 2017-04-20 DIAGNOSIS — C7951 Secondary malignant neoplasm of bone: Secondary | ICD-10-CM | POA: Diagnosis not present

## 2017-04-20 DIAGNOSIS — C787 Secondary malignant neoplasm of liver and intrahepatic bile duct: Secondary | ICD-10-CM

## 2017-04-20 DIAGNOSIS — C50411 Malignant neoplasm of upper-outer quadrant of right female breast: Secondary | ICD-10-CM

## 2017-04-20 DIAGNOSIS — Z171 Estrogen receptor negative status [ER-]: Secondary | ICD-10-CM | POA: Diagnosis not present

## 2017-04-20 DIAGNOSIS — C50919 Malignant neoplasm of unspecified site of unspecified female breast: Secondary | ICD-10-CM

## 2017-04-20 DIAGNOSIS — Z5189 Encounter for other specified aftercare: Secondary | ICD-10-CM | POA: Diagnosis not present

## 2017-04-20 DIAGNOSIS — C7800 Secondary malignant neoplasm of unspecified lung: Secondary | ICD-10-CM

## 2017-04-20 LAB — COMPREHENSIVE METABOLIC PANEL
ALBUMIN: 3.7 g/dL (ref 3.5–5.0)
ALK PHOS: 43 U/L (ref 40–150)
ALT: 101 U/L — ABNORMAL HIGH (ref 0–55)
AST: 135 U/L — AB (ref 5–34)
Anion Gap: 14 mEq/L — ABNORMAL HIGH (ref 3–11)
BILIRUBIN TOTAL: 1.29 mg/dL — AB (ref 0.20–1.20)
BUN: 12.3 mg/dL (ref 7.0–26.0)
CALCIUM: 6.2 mg/dL — AB (ref 8.4–10.4)
CO2: 24 mEq/L (ref 22–29)
Chloride: 102 mEq/L (ref 98–109)
Creatinine: 0.7 mg/dL (ref 0.6–1.1)
EGFR: >60 mL/min/{1.73_m2} (ref >60)
GLUCOSE: 79 mg/dL (ref 70–140)
Potassium: 3.4 mEq/L — ABNORMAL LOW (ref 3.5–5.1)
SODIUM: 140 meq/L (ref 136–145)
TOTAL PROTEIN: 7.1 g/dL (ref 6.4–8.3)

## 2017-04-20 LAB — MANUAL DIFFERENTIAL
ALC: 0.3 10*3/uL — ABNORMAL LOW (ref 0.9–3.3)
ANC (CHCC manual diff): 0.1 10*3/uL — CL (ref 1.5–6.5)
BLASTS: 2 % — AB (ref 0–0)
Band Neutrophils: 4 % (ref 0–10)
LYMPH: 54 % — AB (ref 14–49)
MONO: 18 % — AB (ref 0–14)
MYELOCYTES: 2 % — AB (ref 0–0)
NRBC: 22 % — AB (ref 0–0)
PLT EST: ADEQUATE
SEG: 16 % — ABNORMAL LOW (ref 38–77)
VARIANT LYMPH: 4 % — AB (ref 0–0)

## 2017-04-20 LAB — CBC WITH DIFFERENTIAL/PLATELET
HCT: 26.5 % — ABNORMAL LOW (ref 34.8–46.6)
HGB: 8.5 g/dL — ABNORMAL LOW (ref 11.6–15.9)
MCH: 27.1 pg (ref 25.1–34.0)
MCHC: 32.1 g/dL (ref 31.5–36.0)
MCV: 84.4 fL (ref 79.5–101.0)
Platelets: 171 10*3/uL (ref 145–400)
RBC: 3.14 10*6/uL — AB (ref 3.70–5.45)
RDW: 14.5 % (ref 11.2–14.5)
WBC: 0.6 10*3/uL — AB (ref 3.9–10.3)

## 2017-04-20 MED ORDER — TBO-FILGRASTIM 480 MCG/0.8ML ~~LOC~~ SOSY
480.0000 ug | PREFILLED_SYRINGE | Freq: Once | SUBCUTANEOUS | Status: AC
  Administered 2017-04-20: 480 ug via SUBCUTANEOUS

## 2017-04-20 MED ORDER — TBO-FILGRASTIM 480 MCG/0.8ML ~~LOC~~ SOSY
PREFILLED_SYRINGE | SUBCUTANEOUS | Status: AC
  Filled 2017-04-20: qty 0.8

## 2017-04-20 MED ORDER — ONDANSETRON 8 MG PO TBDP: 8.0000 mg | ORAL_TABLET | Freq: Three times a day (TID) | ORAL | 0 refills | Status: DC | PRN

## 2017-04-20 MED ORDER — FENTANYL 25 MCG/HR TD PT72: 25.0000 ug | MEDICATED_PATCH | TRANSDERMAL | 0 refills | Status: DC

## 2017-04-20 NOTE — Progress Notes (Signed)
BP checked two more times in left arm before leaving office. First time 132/75 HR 109, 2nd BP 127/68 HR 120 using larger cuff.  Patient began to feel somewhat better before she left.  C/O room being too warm.

## 2017-04-20 NOTE — Patient Instructions (Addendum)
Take Tums three times a day You will receive Neuopogen today and tomorrow.     Tbo-Filgrastim injection What is this medicine? TBO-FILGRASTIM (T B O fil GRA stim) is a granulocyte colony-stimulating factor that stimulates the growth of neutrophils, a type of white blood cell important in the body's fight against infection. It is used to reduce the incidence of fever and infection in patients with certain types of cancer who are receiving chemotherapy that affects the bone marrow. This medicine may be used for other purposes; ask your health care provider or pharmacist if you have questions. COMMON BRAND NAME(S): Granix What should I tell my health care provider before I take this medicine? They need to know if you have any of these conditions: -bone scan or tests planned -kidney disease -sickle cell anemia -an unusual or allergic reaction to tbo-filgrastim, filgrastim, pegfilgrastim, other medicines, foods, dyes, or preservatives -pregnant or trying to get pregnant -breast-feeding How should I use this medicine? This medicine is for injection under the skin. If you get this medicine at home, you will be taught how to prepare and give this medicine. Refer to the Instructions for Use that come with your medication packaging. Use exactly as directed. Take your medicine at regular intervals. Do not take your medicine more often than directed. It is important that you put your used needles and syringes in a special sharps container. Do not put them in a trash can. If you do not have a sharps container, call your pharmacist or healthcare provider to get one. Talk to your pediatrician regarding the use of this medicine in children. Special care may be needed. Overdosage: If you think you have taken too much of this medicine contact a poison control center or emergency room at once. NOTE: This medicine is only for you. Do not share this medicine with others. What if I miss a dose? It is important not  to miss your dose. Call your doctor or health care professional if you miss a dose. What may interact with this medicine? This medicine may interact with the following medications: -medicines that may cause a release of neutrophils, such as lithium This list may not describe all possible interactions. Give your health care provider a list of all the medicines, herbs, non-prescription drugs, or dietary supplements you use. Also tell them if you smoke, drink alcohol, or use illegal drugs. Some items may interact with your medicine. What should I watch for while using this medicine? You may need blood work done while you are taking this medicine. What side effects may I notice from receiving this medicine? Side effects that you should report to your doctor or health care professional as soon as possible: -allergic reactions like skin rash, itching or hives, swelling of the face, lips, or tongue -blood in the urine -dark urine -dizziness -fast heartbeat -feeling faint -shortness of breath or breathing problems -signs and symptoms of infection like fever or chills; cough; or sore throat -signs and symptoms of kidney injury like trouble passing urine or change in the amount of urine -stomach or side pain, or pain at the shoulder -sweating -swelling of the legs, ankles, or abdomen -tiredness Side effects that usually do not require medical attention (report to your doctor or health care professional if they continue or are bothersome): -bone pain -headache -muscle pain -vomiting This list may not describe all possible side effects. Call your doctor for medical advice about side effects. You may report side effects to FDA at 1-800-FDA-1088. Where  should I keep my medicine? Keep out of the reach of children. Store in a refrigerator between 2 and 8 degrees C (36 and 46 degrees F). Keep in carton to protect from light. Throw away this medicine if it is left out of the refrigerator for more than 5  consecutive days. Throw away any unused medicine after the expiration date. NOTE: This sheet is a summary. It may not cover all possible information. If you have questions about this medicine, talk to your doctor, pharmacist, or health care provider.  2018 Elsevier/Gold Standard (2015-06-15 19:07:04)

## 2017-04-20 NOTE — Progress Notes (Signed)
[facility]  [phone]) [phone] [phone]) [phone]     ID: Lindsay [name] DOB: Dec 31, 1965  MR#: [id number]  PNT#:[id number]  Patient Care Team: Bernerd Limbo, MD as PCP - General (Family Medicine) Rolm Bookbinder, MD as Consulting Physician (General Surgery) Magrinat, Virgie Dad, MD as Consulting Physician (Oncology) Gery Pray, MD as Consulting Physician (Radiation Oncology) Newton Pigg, MD as Consulting Physician (Obstetrics and Gynecology) Dorna Leitz, MD as Consulting Physician (Orthopedic Surgery) Delice Bison, Charlestine Massed, NP as Nurse Practitioner (Hematology and Oncology) OTHER MD:  CHIEF COMPLAINT: Triple negative stage IV breast cancer  CURRENT TREATMENT: CMF,  denosumab/Xgeva   BREAST CANCER HISTORY: From the original intake note:  Lindsay [name] had routine screening mammography with tomography at [facility] 12/04/2015. There was a new mass measuring 1.5 cm at the 11:00 position of the right breast. Right breast ultrasonography 12/10/2015 confirmed a 1.9 cm irregular mass in the upper-outer quadrant of the right breast posteriorly. There was a right axillary lymph node with focal cortical thickening.  On 12/14/2015 Lindsay [name] underwent biopsy of the right breast mass at 9:00 position as well as a suspicious lymph node. A separate area in the right breast 10 cm from the nipple was also biopsied biopsied. The final pathology ([id number]) showed the additional area and the lymph node to be benign. The 9:00 mass however was positive for invasive ductal carcinoma, grade 3, estrogen receptor and progesterone receptor negative, with an MIB-1 of 70%, and HER-2 nonamplified with a signals ratio 1.44, and the number per cell 2.70.  Her subsequent history is as detailed below  INTERVAL HISTORY: Lindsay [name] returns today for follow-up and treatment of her metastatic triple negative breast cancer. She is accompanied by her motherand husband. Today is day 15 cycle 1 of CMF, to be  repeated every 21 days.   REVIEW OF SYSTEMS: Lindsay [name] is doing better today as compared to last week.  She increased Effexor and is doing better as far as mood is concerned.  Her weight is down to 170 today.  This is a 15 pound weight loss since 04/06/17. She is more alert, and doing more, however she continues to have weakness, and requires assistance from her husband for all of her activities of daily living with the exception of eating.  Her husband is requesting a rollator walker for her to see if that will help with her mobility issues.  They have difficulty getting out of the house due to her mobility impairments.  She is having a difficult time urinating, however she denies constipation, dysuria, bladder pain. She denies any fevers, chills, further nausea, vomiting, or any other concerns.    PAST MEDICAL HISTORY: Past Medical History:  Diagnosis Date  . Allergy    seasonal  . Arthritis   . Breast cancer of upper-outer quadrant of right female breast ([place]) 12/16/2015  . Depression    mild depression after death of daughter- no meds  . History of radiation therapy 06/20/17-08/04/16   right breast 50.4 gy in 28 fractions, boost 10 Gy in 5 fractions  . Hot flashes   . Hx of adenomatous colonic polyps 04/25/2014  . Hypertension     PAST SURGICAL HISTORY: Past Surgical History:  Procedure Laterality Date  . ABDOMINAL HYSTERECTOMY    . BREAST LUMPECTOMY WITH RADIOACTIVE SEED AND SENTINEL LYMPH NODE BIOPSY Right 05/05/2016   Procedure: BREAST LUMPECTOMY WITH RADIOACTIVE SEED AND SENTINEL LYMPH NODE BIOPSY;  Surgeon: Rolm Bookbinder, MD;  Location: [facility];  Service: General;  Laterality:  Right;  Marland Kitchen CHOLECYSTECTOMY N/A 09/09/2015   Procedure: LAPAROSCOPIC CHOLECYSTECTOMY;  Surgeon: Ralene Ok, MD;  Location: [facility];  Service: General;  Laterality: N/A;  . COLONOSCOPY    . KNEE ARTHROSCOPY  2016  . PERIPHERAL VASCULAR CATHETERIZATION Right 05/05/2016   Procedure: PORTA  CATH REMOVAL;  Surgeon: Rolm Bookbinder, MD;  Location: [facility];  Service: General;  Laterality: Right;  . PORTACATH PLACEMENT Right 12/29/2015   Procedure: INSERTION PORT-A-CATH WITH ULTRASOUND GUIDANCE;  Surgeon: Rolm Bookbinder, MD;  Location: [place];  Service: General;  Laterality: Right;  . PORTACATH PLACEMENT Right 04/06/2017   Procedure: INSERTION PORT-A-CATH WITH [place];  Surgeon: Rolm Bookbinder, MD;  Location: [place];  Service: General;  Laterality: Right;    FAMILY HISTORY Family History  Problem Relation Age of Onset  . Hypertension Mother   . Breast cancer Maternal Grandfather   . Breast cancer Other   . Breast cancer Cousin   . Sudden death Neg Hx   . Diabetes Neg Hx   . Heart attack Neg Hx   . Hyperlipidemia Neg Hx   . Colon cancer Neg Hx   . Esophageal cancer Neg Hx   . Rectal cancer Neg Hx   . Stomach cancer Neg Hx   The patient's father was murdered at age 10. The patient's mother is 3 years old as of August 2017. The patient has one brother, 2 sisters. A cousin was diagnosed with left breast cancer at age 34. A maternal aunt and a maternal grandmother were also diagnosed with breast cancer at age 61 and 39 respectively.  GYNECOLOGIC HISTORY:  No LMP recorded. Patient has had a hysterectomy.  Menarche age 1, first live birth age 61, the patient is GX P3. She is status post hysterectomy without salpingo-oophorectomy. She did not take hormone replacement. She used oral contraceptives remotely without complications.  SOCIAL HISTORY:  Lindsay [name] worked as an Engineer, production at [employer].  She is now applying for disability.  Her husband [name] [place] is a news and record Lexicographer. Son [name] lives in [place] and is a Games developer. Son Cana Mignano lives in [place] and is a Architectural technologist. The patient had a daughter who died at the age of 47.   ADVANCED DIRECTIVES: Not in place   HEALTH MAINTENANCE: Social History   Tobacco Use  . Smoking  status: Never Smoker  . Smokeless tobacco: Never Used  Substance Use Topics  . Alcohol use: No    Alcohol/week: 0.0 oz  . Drug use: No     Colonoscopy: 2016  PAP:  Bone density: Never   Allergies  Allergen Reactions  . Gabapentin Other (See Comments)    "light headed, dizziness, didn't like the way it made me feel"  . Dilaudid [Hydromorphone Hcl] Nausea And Vomiting  . Percocet [Oxycodone-Acetaminophen] Diarrhea and Nausea And Vomiting  . Pollen Extract Other (See Comments)    Runny nose, itchy eyes and sneezing due to seasonal allergies.  . Tramadol Nausea And Vomiting  . Vicodin [Hydrocodone-Acetaminophen] Nausea And Vomiting    Current Outpatient Medications  Medication Sig Dispense Refill  . ALPRAZolam (XANAX) 0.5 MG tablet Take 0.5 mg by mouth daily as needed for anxiety or sleep.   0  . dexamethasone (DECADRON) 4 MG tablet Take 2 tablets (8 mg total) by mouth daily. Start the day after chemotherapy for 2 days. Take with food. 30 tablet 1  . EPINEPHrine (EPIPEN 2-PAK) 0.3 mg/0.3 mL IJ SOAJ injection Inject 0.3 mg into the  muscle once as needed (anaphylaxis allergic reaction).     . fentaNYL (DURAGESIC - DOSED MCG/HR) 25 MCG/HR patch Place 1 patch (25 mcg total) onto the skin every 3 (three) days. 10 patch 0  . fexofenadine (ALLEGRA) 180 MG tablet Take 180 mg by mouth daily as needed (for allergies.).     Marland Kitchen ibuprofen (ADVIL,MOTRIN) 800 MG tablet Take 1 tablet (800 mg total) by mouth 3 (three) times daily. 21 tablet 0  . Ibuprofen-Diphenhydramine HCl (ADVIL PM) 200-25 MG CAPS Take 1 tablet by mouth at bedtime as needed (sleep).    Marland Kitchen lidocaine-prilocaine (EMLA) cream Apply to affected area once 30 g 3  . lisinopril-hydrochlorothiazide (PRINZIDE,ZESTORETIC) 20-12.5 MG per tablet Take 1 tablet by mouth daily.    . NON FORMULARY 1 Syringe every 7 (seven) days. Allergy Shot    . ondansetron (ZOFRAN) 8 MG tablet Take 1 tablet (8 mg total) by mouth 2 (two) times daily as needed for  refractory nausea / vomiting. Start on day 3 after chemotherapy. 30 tablet 1  . prochlorperazine (COMPAZINE) 10 MG tablet Take 1 tablet (10 mg total) every 6 (six) hours as needed by mouth (Nausea or vomiting). 30 tablet 1  . venlafaxine XR (EFFEXOR-XR) 150 MG 24 hr capsule Take 1 capsule (150 mg total) by mouth daily. 30 capsule 5  . HYDROmorphone (DILAUDID) 2 MG tablet Take 1-2 tablets (2-4 mg total) by mouth every 4 (four) hours as needed for severe pain. (Patient not taking: Reported on 04/13/2017) 120 tablet 0  . ondansetron (ZOFRAN-ODT) 8 MG disintegrating tablet Take 1 tablet (8 mg total) by mouth every 8 (eight) hours as needed for nausea or vomiting. 20 tablet 0  . potassium chloride SA (K-DUR,KLOR-CON) 20 MEQ tablet Take 1 tablet (20 mEq total) by mouth once for 1 dose. 100 tablet 4   Current Facility-Administered Medications  Medication Dose Route Frequency Provider Last Rate Last Dose  . Tbo-Filgrastim (GRANIX) injection 480 mcg  480 mcg Subcutaneous Once Gardenia Phlegm, NP        OBJECTIVE:  Vitals:   04/20/17 0959  BP: 132/75  Pulse: (!) 107  Resp: 18  Temp: 98.8 F (37.1 C)  SpO2: 100%     Body mass index is 27.45 kg/m.    ECOG FS:3 GENERAL: Patient is a tired and chronically ill appearing female in no acute distress in wheel chair exam limited HEENT:  Sclerae icteric.  Oropharynx clear and moist. No ulcerations or evidence of oropharyngeal candidiasis. Neck is supple.  NODES:  No cervical, supraclavicular, or axillary lymphadenopathy palpated.  BREAST EXAM:  Deferred. LUNGS:  Clear to auscultation bilaterally.  No wheezes or rhonchi. HEART:  Regular rate and rhythm. No murmur appreciated. ABDOMEN:  Soft, nontender.  Positive, normoactive bowel sounds. No organomegaly palpated. MSK:  No focal spinal tenderness to palpation.  EXTREMITIES:  No peripheral edema.   NEURO:  Nonfocal. Well oriented.  Flat affect.    LAB RESULTS:  CMP     Component Value  Date/Time   NA 140 04/20/2017 0942   K 3.4 (L) 04/20/2017 0942   CL 102 04/11/2017 0426   CO2 24 04/20/2017 0942   GLUCOSE 79 04/20/2017 0942   BUN 12.3 04/20/2017 0942   CREATININE 0.7 04/20/2017 0942   CALCIUM 6.2 (LL) 04/20/2017 0942   PROT 7.1 04/20/2017 0942   ALBUMIN 3.7 04/20/2017 0942   AST 135 (H) 04/20/2017 0942   ALT 101 (H) 04/20/2017 0942   ALKPHOS 43 04/20/2017 8887  BILITOT 1.29 (H) 04/20/2017 0942   GFRNONAA >60 04/11/2017 0426   GFRAA >60 04/11/2017 0426    INo results found for: SPEP, UPEP  Lab Results  Component Value Date   WBC 0.6 (LL) 04/20/2017   NEUTROABS 1.3 (L) 04/11/2017   HGB 8.5 (L) 04/20/2017   HCT 26.5 (L) 04/20/2017   MCV 84.4 04/20/2017   PLT 171 04/20/2017      Chemistry      Component Value Date/Time   NA 140 04/20/2017 0942   K 3.4 (L) 04/20/2017 0942   CL 102 04/11/2017 0426   CO2 24 04/20/2017 0942   BUN 12.3 04/20/2017 0942   CREATININE 0.7 04/20/2017 0942      Component Value Date/Time   CALCIUM 6.2 (LL) 04/20/2017 0942   ALKPHOS 43 04/20/2017 0942   AST 135 (H) 04/20/2017 0942   ALT 101 (H) 04/20/2017 0942   BILITOT 1.29 (H) 04/20/2017 0942       No results found for: LABCA2  No components found for: TIRWE315  No results for input(s): INR in the last 168 hours.  Urinalysis    Component Value Date/Time   COLORURINE YELLOW 04/09/2016 1455   APPEARANCEUR CLOUDY (A) 04/09/2016 1455   LABSPEC 1.011 04/09/2016 1455   PHURINE 7.0 04/09/2016 1455   GLUCOSEU NEGATIVE 04/09/2016 1455   HGBUR NEGATIVE 04/09/2016 1455   BILIRUBINUR NEGATIVE 04/09/2016 1455   KETONESUR NEGATIVE 04/09/2016 1455   PROTEINUR NEGATIVE 04/09/2016 1455   UROBILINOGEN 1.0 03/03/2015 1240   NITRITE NEGATIVE 04/09/2016 1455   LEUKOCYTESUR NEGATIVE 04/09/2016 1455     STUDIES: Dg Chest Port 1 View  Result Date: 04/06/2017 CLINICAL DATA:  Port catheter placement. EXAM: PORTABLE CHEST 1 VIEW COMPARISON:  MRI abdomen dated March 01, 2017. Chest x-ray dated January 24, 2016. FINDINGS: Right port catheter with the tip projecting over the proximal SVC. The cardiomediastinal silhouette is normal in size. Normal pulmonary vascularity. Increased reticulonodular interstitial lung markings in both lung bases. No pleural effusion or pneumothorax. No acute osseous abnormality. IMPRESSION: 1. Right port catheter with tip projecting over the proximal SVC. No pneumothorax. 2. Increased reticulonodular densities in both lower lobes, consistent with known pulmonary metastatic disease. Electronically Signed   By: Titus Dubin M.D.   On: 04/06/2017 09:26   Dg Fluoro Guide Cv Line-no Report  Result Date: 04/06/2017 Fluoroscopy was utilized by the requesting physician.  No radiographic interpretation.     ELIGIBLE FOR AVAILABLE RESEARCH PROTOCOL: considered the PREVENT trial: decided against it  ASSESSMENT: 51 y.o. [place] woman status post right breast upper outer quadrant biopsy 12/14/2015 for a clinical T1c N0, stage IA  invasive ductal carcinoma, grade 3, triple negative, with an MIB-1 of 70%  (a) this stages as IIB In the 2018 new Prognostic classification  (1) genetics testing negative for mutations within any of 20 genes on the Breast/Ovarian Cancer Panel through [employer].  Additionally, no VUS were found.   (2) neoadjuvant chemotherapy consisting of doxorubicin and cyclophosphamide in dose this fashion 4, completed 02/22/2016, followed by paclitaxel weekly 5 (of 12 treatments planned), starting 03/08/2016, stopped 04/05/2016 after cycle 5 because of neuropathy  (a) cycle 4 of cyclophosphamide and doxorubicin held 1 week because of side effects  (3) status post right lumpectomy and sentinel lymph node sampling 05/05/2016 for a residual ypT2 ypN) invasive ductal carcinoma, grade 3, the repeat prognostic panel again triple negative the residual cancer burden was 2  (4) adjuvant radiation 06/20/16 - 08/04/16 : 50.4 Gy  to the right breast plus a 10 Gy boost  (5) left thyroid nodule biopsy 04/14/2016 read as [facility] 1  METASTATIC DISEASE: October 2018 (6) MRI of the abdomen March 01, 2017 shows multiple lesions in the lungs, liver, and bones  (a) the tumor is PD-L1 negative  (7) started capecitabine March 14, 2017; plan is to take 14 days on, 7 days off  (a) discontinued 03/19/2017 with multiple side effects  (8) started denosumab/Xgeva March 10, 2017, to be repeated every 28 days  (9) pain syndrome: currently on hydromorphone 2 mg Q4h PRN  (10) started CMF chemotherapy 04/06/2017, to be repeated every 21 days  (1) her tumor is PD-L1 negative (03/22/2017)   PLAN: Korena is better today than last week.  She has several issues today, so I am going to list them by numerical order.  1. Pain: Controlled with Fentanyl patches, and I wrote for a refill on these today #10.  She is having normal bowel movements.  2. Urinary Hesitancy: minor, she is still going, just not as often.  Recommended she continue to drink fluids, as this is likely related to her pain medication.  She is still urinating.    3.  Hypocalcemia: Her corrected calcium is 6.4 today.  She was recommended to take Tums TID for this.  4. Neutropenia: Neutropenic precautions were reviewed.  She will receive Granix today and tomorrow.  I added Onpro to cycle 2-6 CMF chemotherapy orders.    5. Immobility issues and need for increased assistance: I placed an order for home health PT/OT so they can fully evaluate.  I also ordered a rollator walker for the patient to assist with her mobility.  Cecille Rubin, LPN is working on getting her in with a home health agency that can meet all of her needs.   Jeroline will return tomorrow for Granix and in one week for lab, eval by Dr. Jana Hakim, and cycle 2 of CMF.  Dorota and her husband verbalized understanding of this plan.  They know to call for any questions or concerns prior to her next appointment with [place].     A total of (30) minutes of face-to-face time was spent with this patient with greater than 50% of that time in counseling and care-coordination.    Wilber Bihari, NP  04/20/17 10:53 AM Medical Oncology and Hematology [facility] [address] [place], [place] [address] Tel. [phone]    Fax. [phone]

## 2017-04-20 NOTE — Telephone Encounter (Signed)
Patient scheduled 12/14 per 12/13 los. Patient will receive update in MyChart.

## 2017-04-21 ENCOUNTER — Ambulatory Visit (HOSPITAL_BASED_OUTPATIENT_CLINIC_OR_DEPARTMENT_OTHER): Payer: BLUE CROSS/BLUE SHIELD

## 2017-04-21 DIAGNOSIS — C50411 Malignant neoplasm of upper-outer quadrant of right female breast: Secondary | ICD-10-CM | POA: Diagnosis not present

## 2017-04-21 DIAGNOSIS — Z5189 Encounter for other specified aftercare: Secondary | ICD-10-CM | POA: Diagnosis not present

## 2017-04-21 DIAGNOSIS — Z171 Estrogen receptor negative status [ER-]: Principal | ICD-10-CM

## 2017-04-21 MED ORDER — TBO-FILGRASTIM 480 MCG/0.8ML ~~LOC~~ SOSY
480.0000 ug | PREFILLED_SYRINGE | Freq: Once | SUBCUTANEOUS | Status: AC
  Administered 2017-04-21: 480 ug via SUBCUTANEOUS

## 2017-04-22 ENCOUNTER — Encounter: Payer: Self-pay | Admitting: Adult Health

## 2017-04-24 ENCOUNTER — Telehealth: Payer: Self-pay

## 2017-04-24 NOTE — Telephone Encounter (Signed)
This RN returned pt call regarding report of inability to urinate x 3 days.  Spoke with pt's mother.  Pt was near by and this RN could hear pt respond when her mother would ask her a question.    Per mother and pt, pt did not go 3 days without voiding but instead was felt like she had to pee and was having difficulty voiding x 3 days.  Pt has voided "2 times today so far" as of this phone call.  At this time pt does not feel like her bladder is full and does not have the urge to void.  Pt's mother states she does not think pt is drinking enough fluids.  This RN explained importance of drinking plenty of fluids and also advised mother / pt that if the pt goes > 12 hours without voiding they need to call and let us know.  Mother verbalized understanding of instructions.

## 2017-04-25 ENCOUNTER — Telehealth: Payer: Self-pay | Admitting: Medical Oncology

## 2017-04-25 ENCOUNTER — Inpatient Hospital Stay (HOSPITAL_COMMUNITY): Payer: BLUE CROSS/BLUE SHIELD

## 2017-04-25 ENCOUNTER — Encounter (HOSPITAL_COMMUNITY): Payer: Self-pay

## 2017-04-25 ENCOUNTER — Encounter: Payer: Self-pay | Admitting: General Practice

## 2017-04-25 ENCOUNTER — Inpatient Hospital Stay (HOSPITAL_COMMUNITY)
Admission: EM | Admit: 2017-04-25 | Discharge: 2017-05-03 | DRG: 696 | Disposition: A | Discharge status: Home Health | Payer: BLUE CROSS/BLUE SHIELD | Attending: Internal Medicine | Admitting: Internal Medicine

## 2017-04-25 ENCOUNTER — Emergency Department (HOSPITAL_COMMUNITY): Payer: BLUE CROSS/BLUE SHIELD

## 2017-04-25 ENCOUNTER — Other Ambulatory Visit: Payer: Self-pay

## 2017-04-25 DIAGNOSIS — Z885 Allergy status to narcotic agent status: Secondary | ICD-10-CM | POA: Diagnosis not present

## 2017-04-25 DIAGNOSIS — K5903 Drug induced constipation: Secondary | ICD-10-CM | POA: Diagnosis not present

## 2017-04-25 DIAGNOSIS — C50411 Malignant neoplasm of upper-outer quadrant of right female breast: Secondary | ICD-10-CM | POA: Diagnosis present

## 2017-04-25 DIAGNOSIS — T450X5A Adverse effect of antiallergic and antiemetic drugs, initial encounter: Secondary | ICD-10-CM | POA: Diagnosis present

## 2017-04-25 DIAGNOSIS — Z171 Estrogen receptor negative status [ER-]: Secondary | ICD-10-CM | POA: Diagnosis not present

## 2017-04-25 DIAGNOSIS — Z923 Personal history of irradiation: Secondary | ICD-10-CM | POA: Diagnosis not present

## 2017-04-25 DIAGNOSIS — E649 Sequelae of unspecified nutritional deficiency: Secondary | ICD-10-CM | POA: Diagnosis not present

## 2017-04-25 DIAGNOSIS — R52 Pain, unspecified: Secondary | ICD-10-CM

## 2017-04-25 DIAGNOSIS — C7951 Secondary malignant neoplasm of bone: Secondary | ICD-10-CM

## 2017-04-25 DIAGNOSIS — Z79899 Other long term (current) drug therapy: Secondary | ICD-10-CM

## 2017-04-25 DIAGNOSIS — I10 Essential (primary) hypertension: Secondary | ICD-10-CM | POA: Diagnosis present

## 2017-04-25 DIAGNOSIS — Z515 Encounter for palliative care: Secondary | ICD-10-CM | POA: Diagnosis not present

## 2017-04-25 DIAGNOSIS — R339 Retention of urine, unspecified: Secondary | ICD-10-CM | POA: Diagnosis present

## 2017-04-25 DIAGNOSIS — Z888 Allergy status to other drugs, medicaments and biological substances status: Secondary | ICD-10-CM

## 2017-04-25 DIAGNOSIS — Z791 Long term (current) use of non-steroidal anti-inflammatories (NSAID): Secondary | ICD-10-CM | POA: Diagnosis not present

## 2017-04-25 DIAGNOSIS — E876 Hypokalemia: Secondary | ICD-10-CM

## 2017-04-25 DIAGNOSIS — G893 Neoplasm related pain (acute) (chronic): Secondary | ICD-10-CM | POA: Diagnosis present

## 2017-04-25 DIAGNOSIS — E538 Deficiency of other specified B group vitamins: Secondary | ICD-10-CM | POA: Diagnosis not present

## 2017-04-25 DIAGNOSIS — I4581 Long QT syndrome: Secondary | ICD-10-CM | POA: Diagnosis present

## 2017-04-25 DIAGNOSIS — M25552 Pain in left hip: Secondary | ICD-10-CM | POA: Diagnosis present

## 2017-04-25 DIAGNOSIS — C7800 Secondary malignant neoplasm of unspecified lung: Secondary | ICD-10-CM | POA: Diagnosis present

## 2017-04-25 DIAGNOSIS — D6481 Anemia due to antineoplastic chemotherapy: Secondary | ICD-10-CM | POA: Diagnosis present

## 2017-04-25 DIAGNOSIS — E860 Dehydration: Secondary | ICD-10-CM | POA: Diagnosis present

## 2017-04-25 DIAGNOSIS — C50919 Malignant neoplasm of unspecified site of unspecified female breast: Secondary | ICD-10-CM | POA: Diagnosis not present

## 2017-04-25 DIAGNOSIS — D649 Anemia, unspecified: Secondary | ICD-10-CM

## 2017-04-25 DIAGNOSIS — M5442 Lumbago with sciatica, left side: Secondary | ICD-10-CM | POA: Diagnosis not present

## 2017-04-25 DIAGNOSIS — T402X5A Adverse effect of other opioids, initial encounter: Secondary | ICD-10-CM | POA: Diagnosis not present

## 2017-04-25 DIAGNOSIS — T451X5A Adverse effect of antineoplastic and immunosuppressive drugs, initial encounter: Secondary | ICD-10-CM | POA: Diagnosis present

## 2017-04-25 DIAGNOSIS — I959 Hypotension, unspecified: Secondary | ICD-10-CM | POA: Diagnosis present

## 2017-04-25 DIAGNOSIS — C787 Secondary malignant neoplasm of liver and intrahepatic bile duct: Secondary | ICD-10-CM | POA: Diagnosis present

## 2017-04-25 DIAGNOSIS — R945 Abnormal results of liver function studies: Secondary | ICD-10-CM | POA: Diagnosis present

## 2017-04-25 DIAGNOSIS — E611 Iron deficiency: Secondary | ICD-10-CM | POA: Diagnosis not present

## 2017-04-25 DIAGNOSIS — Z91048 Other nonmedicinal substance allergy status: Secondary | ICD-10-CM

## 2017-04-25 DIAGNOSIS — M549 Dorsalgia, unspecified: Secondary | ICD-10-CM

## 2017-04-25 DIAGNOSIS — R9431 Abnormal electrocardiogram [ECG] [EKG]: Secondary | ICD-10-CM | POA: Diagnosis not present

## 2017-04-25 DIAGNOSIS — R531 Weakness: Secondary | ICD-10-CM | POA: Diagnosis not present

## 2017-04-25 LAB — URINALYSIS, ROUTINE W REFLEX MICROSCOPIC
BILIRUBIN URINE: NEGATIVE
GLUCOSE, UA: NEGATIVE mg/dL
HGB URINE DIPSTICK: NEGATIVE
Ketones, ur: 5 mg/dL — AB
Leukocytes, UA: NEGATIVE
Nitrite: NEGATIVE
PH: 6 (ref 5.0–8.0)
Protein, ur: NEGATIVE mg/dL
Specific Gravity, Urine: 1.008 (ref 1.005–1.030)

## 2017-04-25 LAB — COMPREHENSIVE METABOLIC PANEL
ALBUMIN: 3.2 g/dL — AB (ref 3.5–5.0)
ALT: 68 U/L — ABNORMAL HIGH (ref 14–54)
ANION GAP: 10 (ref 5–15)
AST: 117 U/L — ABNORMAL HIGH (ref 15–41)
Alkaline Phosphatase: 42 U/L (ref 38–126)
BUN: 9 mg/dL (ref 6–20)
CHLORIDE: 105 mmol/L (ref 101–111)
CO2: 23 mmol/L (ref 22–32)
Calcium: 5.3 mg/dL — CL (ref 8.9–10.3)
Creatinine, Ser: 0.7 mg/dL (ref 0.44–1.00)
GFR calc non Af Amer: >60 mL/min (ref >60)
GLUCOSE: 104 mg/dL — AB (ref 65–99)
POTASSIUM: 2.7 mmol/L — AB (ref 3.5–5.1)
SODIUM: 138 mmol/L (ref 135–145)
Total Bilirubin: 1.6 mg/dL — ABNORMAL HIGH (ref 0.3–1.2)
Total Protein: 6.1 g/dL — ABNORMAL LOW (ref 6.5–8.1)

## 2017-04-25 LAB — HEPATIC FUNCTION PANEL
ALBUMIN: 3.8 g/dL (ref 3.5–5.0)
ALT: 85 U/L — AB (ref 14–54)
AST: 147 U/L — AB (ref 15–41)
Alkaline Phosphatase: 47 U/L (ref 38–126)
BILIRUBIN TOTAL: 1.8 mg/dL — AB (ref 0.3–1.2)
Bilirubin, Direct: 0.6 mg/dL — ABNORMAL HIGH (ref 0.1–0.5)
Indirect Bilirubin: 1.2 mg/dL — ABNORMAL HIGH (ref 0.3–0.9)
Total Protein: 7.1 g/dL (ref 6.5–8.1)

## 2017-04-25 LAB — CBC WITH DIFFERENTIAL/PLATELET
BASOS ABS: 0.2 10*3/uL — AB (ref 0.0–0.1)
BASOS PCT: 2 %
EOS ABS: 0 10*3/uL (ref 0.0–0.7)
Eosinophils Relative: 0 %
HCT: 19.3 % — ABNORMAL LOW (ref 36.0–46.0)
Hemoglobin: 6.4 g/dL — CL (ref 12.0–15.0)
LYMPHS PCT: 14 %
Lymphs Abs: 1.3 10*3/uL (ref 0.7–4.0)
MCH: 27.8 pg (ref 26.0–34.0)
MCHC: 33.2 g/dL (ref 30.0–36.0)
MCV: 83.9 fL (ref 78.0–100.0)
MONO ABS: 1.6 10*3/uL — AB (ref 0.1–1.0)
Monocytes Relative: 17 %
NEUTROS PCT: 67 %
Neutro Abs: 6.4 10*3/uL (ref 1.7–7.7)
PLATELETS: 316 10*3/uL (ref 150–400)
RBC: 2.3 MIL/uL — ABNORMAL LOW (ref 3.87–5.11)
RDW: 15.8 % — ABNORMAL HIGH (ref 11.5–15.5)
WBC: 9.5 10*3/uL (ref 4.0–10.5)

## 2017-04-25 LAB — BASIC METABOLIC PANEL
Anion gap: 17 — ABNORMAL HIGH (ref 5–15)
BUN: 11 mg/dL (ref 6–20)
CHLORIDE: 97 mmol/L — AB (ref 101–111)
CO2: 22 mmol/L (ref 22–32)
CREATININE: 1.06 mg/dL — AB (ref 0.44–1.00)
Calcium: 5.9 mg/dL — CL (ref 8.9–10.3)
GFR calc non Af Amer: 59 mL/min — ABNORMAL LOW (ref >60)
GLUCOSE: 104 mg/dL — AB (ref 65–99)
Potassium: 2.3 mmol/L — CL (ref 3.5–5.1)
Sodium: 136 mmol/L (ref 135–145)

## 2017-04-25 LAB — ABO/RH: ABO/RH(D): O POS

## 2017-04-25 LAB — CK: CK TOTAL: 557 U/L — AB (ref 38–234)

## 2017-04-25 LAB — MAGNESIUM
Magnesium: 1.5 mg/dL — ABNORMAL LOW (ref 1.7–2.4)
Magnesium: 1.7 mg/dL (ref 1.7–2.4)

## 2017-04-25 LAB — PREPARE RBC (CROSSMATCH)

## 2017-04-25 LAB — PROTIME-INR
INR: 1.24
PROTHROMBIN TIME: 15.5 s — AB (ref 11.4–15.2)

## 2017-04-25 LAB — I-STAT CG4 LACTIC ACID, ED: Lactic Acid, Venous: 1.99 mmol/L — ABNORMAL HIGH (ref 0.5–1.9)

## 2017-04-25 MED ORDER — CHLORHEXIDINE GLUCONATE 0.12 % MT SOLN
15.0000 mL | Freq: Two times a day (BID) | OROMUCOSAL | Status: DC
  Administered 2017-04-26 – 2017-05-02 (×14): 15 mL via OROMUCOSAL
  Filled 2017-04-25 (×14): qty 15

## 2017-04-25 MED ORDER — MAGNESIUM SULFATE 2 GM/50ML IV SOLN
2.0000 g | Freq: Once | INTRAVENOUS | Status: DC
  Filled 2017-04-25: qty 50

## 2017-04-25 MED ORDER — SODIUM CHLORIDE 0.9 % IV BOLUS (SEPSIS)
1000.0000 mL | Freq: Once | INTRAVENOUS | Status: AC
  Administered 2017-04-25: 1000 mL via INTRAVENOUS

## 2017-04-25 MED ORDER — FENTANYL 25 MCG/HR TD PT72: 25.0000 ug | MEDICATED_PATCH | TRANSDERMAL | Status: DC

## 2017-04-25 MED ORDER — DIPHENHYDRAMINE HCL 25 MG PO CAPS: 25.0000 mg | ORAL_CAPSULE | Freq: Every evening | ORAL | Status: DC | PRN

## 2017-04-25 MED ORDER — POTASSIUM CHLORIDE CRYS ER 20 MEQ PO TBCR
40.0000 meq | EXTENDED_RELEASE_TABLET | Freq: Once | ORAL | Status: AC
  Administered 2017-04-25: 40 meq via ORAL
  Filled 2017-04-25: qty 2

## 2017-04-25 MED ORDER — IOPAMIDOL (ISOVUE-300) INJECTION 61%
INTRAVENOUS | Status: AC
Start: 2017-04-25 — End: 2017-04-25
  Administered 2017-04-25: 100 mL via INTRAVENOUS
  Filled 2017-04-25: qty 100

## 2017-04-25 MED ORDER — ORAL CARE MOUTH RINSE
15.0000 mL | Freq: Two times a day (BID) | OROMUCOSAL | Status: DC
  Administered 2017-04-26 – 2017-05-02 (×13): 15 mL via OROMUCOSAL

## 2017-04-25 MED ORDER — IBUPROFEN 200 MG PO TABS: 200.0000 mg | ORAL_TABLET | Freq: Every evening | ORAL | Status: DC | PRN

## 2017-04-25 MED ORDER — CYCLOBENZAPRINE HCL 5 MG PO TABS
5.0000 mg | ORAL_TABLET | Freq: Three times a day (TID) | ORAL | Status: DC | PRN
  Administered 2017-04-25: 5 mg via ORAL
  Filled 2017-04-25: qty 1

## 2017-04-25 MED ORDER — VENLAFAXINE HCL ER 150 MG PO CP24
150.0000 mg | ORAL_CAPSULE | Freq: Every day | ORAL | Status: DC
  Administered 2017-04-26: 150 mg via ORAL
  Filled 2017-04-25: qty 1

## 2017-04-25 MED ORDER — MORPHINE SULFATE (PF) 4 MG/ML IV SOLN: 2.0000 mg | INTRAVENOUS | Status: DC | PRN

## 2017-04-25 MED ORDER — IBUPROFEN-DIPHENHYDRAMINE HCL 200-25 MG PO CAPS: 1.0000 | ORAL_CAPSULE | Freq: Every evening | ORAL | Status: DC | PRN

## 2017-04-25 MED ORDER — SODIUM CHLORIDE 0.9 % IV SOLN
1.0000 g | Freq: Once | INTRAVENOUS | Status: AC
  Administered 2017-04-25: 1 g via INTRAVENOUS
  Filled 2017-04-25: qty 10

## 2017-04-25 MED ORDER — ONDANSETRON 4 MG PO TBDP: 8.0000 mg | ORAL_TABLET | Freq: Three times a day (TID) | ORAL | Status: DC | PRN

## 2017-04-25 MED ORDER — POTASSIUM CHLORIDE 10 MEQ/100ML IV SOLN
10.0000 meq | INTRAVENOUS | Status: AC
  Administered 2017-04-25 (×3): 10 meq via INTRAVENOUS
  Filled 2017-04-25 (×3): qty 100

## 2017-04-25 MED ORDER — ENOXAPARIN SODIUM 40 MG/0.4ML ~~LOC~~ SOLN
40.0000 mg | SUBCUTANEOUS | Status: DC
  Administered 2017-04-25 – 2017-05-02 (×8): 40 mg via SUBCUTANEOUS
  Filled 2017-04-25 (×8): qty 0.4

## 2017-04-25 MED ORDER — HYDROMORPHONE HCL 2 MG PO TABS
2.0000 mg | ORAL_TABLET | ORAL | Status: DC | PRN
  Administered 2017-04-25 – 2017-04-27 (×5): 2 mg via ORAL
  Filled 2017-04-25 (×5): qty 1

## 2017-04-25 MED ORDER — SODIUM CHLORIDE 0.9% FLUSH
10.0000 mL | INTRAVENOUS | Status: DC | PRN
  Administered 2017-04-28: 10 mL
  Administered 2017-04-29 – 2017-05-02 (×2): 20 mL
  Filled 2017-04-25 (×3): qty 40

## 2017-04-25 MED ORDER — PROCHLORPERAZINE MALEATE 10 MG PO TABS
10.0000 mg | ORAL_TABLET | Freq: Four times a day (QID) | ORAL | Status: DC | PRN
  Filled 2017-04-25: qty 1

## 2017-04-25 MED ORDER — POTASSIUM CHLORIDE IN NACL 20-0.9 MEQ/L-% IV SOLN
INTRAVENOUS | Status: DC
  Administered 2017-04-25 – 2017-04-26 (×2): via INTRAVENOUS
  Filled 2017-04-25 (×2): qty 1000

## 2017-04-25 MED ORDER — PROCHLORPERAZINE MALEATE 10 MG PO TABS: 10.0000 mg | ORAL_TABLET | Freq: Four times a day (QID) | ORAL | Status: DC | PRN

## 2017-04-25 MED ORDER — SODIUM CHLORIDE 0.9 % IV SOLN
1.0000 g | Freq: Once | INTRAVENOUS | Status: DC
  Filled 2017-04-25: qty 10

## 2017-04-25 MED ORDER — SODIUM CHLORIDE 0.9 % IV SOLN
1.0000 g | Freq: Once | INTRAVENOUS | Status: AC
  Administered 2017-04-26: 1 g via INTRAVENOUS
  Filled 2017-04-25 (×2): qty 10

## 2017-04-25 MED ORDER — MORPHINE SULFATE (PF) 2 MG/ML IV SOLN: 2.0000 mg | INTRAVENOUS | Status: DC | PRN

## 2017-04-25 MED ORDER — MAGNESIUM SULFATE 2 GM/50ML IV SOLN
2.0000 g | Freq: Once | INTRAVENOUS | Status: AC
  Administered 2017-04-25: 2 g via INTRAVENOUS
  Filled 2017-04-25: qty 50

## 2017-04-25 MED ORDER — ONDANSETRON HCL 4 MG/2ML IJ SOLN
4.0000 mg | Freq: Four times a day (QID) | INTRAMUSCULAR | Status: DC | PRN
  Administered 2017-04-25 – 2017-04-26 (×2): 4 mg via INTRAVENOUS
  Filled 2017-04-25 (×2): qty 2

## 2017-04-25 MED ORDER — ALPRAZOLAM 0.5 MG PO TABS
0.5000 mg | ORAL_TABLET | Freq: Every day | ORAL | Status: DC | PRN
  Administered 2017-04-27 – 2017-05-01 (×4): 0.5 mg via ORAL
  Filled 2017-04-25 (×4): qty 1

## 2017-04-25 MED ORDER — SODIUM CHLORIDE 0.9 % IV SOLN
Freq: Once | INTRAVENOUS | Status: AC
  Administered 2017-04-25: 09:00:00 via INTRAVENOUS

## 2017-04-25 MED ORDER — IOPAMIDOL (ISOVUE-300) INJECTION 61%: 100.0000 mL | Freq: Once | INTRAVENOUS | Status: DC | PRN

## 2017-04-25 NOTE — ED Notes (Signed)
Attempted to give Calcium gluconate. Pump channel error happened which will delay med administration. This RN will find another channel ASAP and give Calcium gluconate IVPB.

## 2017-04-25 NOTE — ED Notes (Signed)
Patient transported to CT 

## 2017-04-25 NOTE — ED Notes (Signed)
Patient unable to urinate on bedside commode. Will insert foley catheter.

## 2017-04-25 NOTE — ED Notes (Signed)
Pt reporting generalized weakness along with urinary retention for the last week. Pt reports having increasing weakness with ambulation along with shortness of breath on ambulation. Pt currently on 2L O2 via [place] due to room air saturation 87%

## 2017-04-25 NOTE — ED Provider Notes (Signed)
[facility] DEPT Provider Note   CSN: [id number] Arrival date & time: 04/25/17  0515     History   Chief Complaint Chief Complaint  Patient presents with  . Urinary Retention  . Chemotherapy    HPI Lindsay [name] is a 51 y.o. female.  The history is provided by the patient and the spouse.  Illness  This is a new problem. The current episode started more than 2 days ago. The problem occurs constantly. The problem has been gradually worsening. Pertinent negatives include no chest pain, no abdominal pain, no headaches and no shortness of breath. Nothing aggravates the symptoms. Nothing relieves the symptoms.  Patient with history of breast cancer with bone/liver/lung metastasis She is on current chemotherapy, last session over a week ago Over the past several days she has had difficulty urinating, has been having dark urine output when she does urinate She has been keeping up with her p.o. fluids but is still not urinating She reports some generalized weakness, but denies any other significant complaints, denies headache, fever, chest pain, abdominal pain, back pain  Past Medical History:  Diagnosis Date  . Allergy    seasonal  . Arthritis   . Breast cancer of upper-outer quadrant of right female breast ([place]) 12/16/2015  . Depression    mild depression after death of daughter- no meds  . History of radiation therapy 06/20/17-08/04/16   right breast 50.4 gy in 28 fractions, boost 10 Gy in 5 fractions  . Hot flashes   . Hx of adenomatous colonic polyps 04/25/2014  . Hypertension     Patient Active Problem List   Diagnosis Date Noted  . Goals of care, counseling/discussion 03/24/2017  . Bone metastases ([place]) 03/03/2017  . Liver metastases ([place]) 03/03/2017  . Lung metastases ([place]) 03/03/2017  . Breast cancer metastasized to bone ([place]) 03/02/2017  . Genetic testing 03/20/2016  . Family history of breast cancer in female 02/10/2016  . Malignant  neoplasm of upper-outer quadrant of right breast in female, estrogen receptor negative ([place]) 12/16/2015  . Hx of adenomatous colonic polyps 04/25/2014  . Neck pain 11/06/2012    Past Surgical History:  Procedure Laterality Date  . ABDOMINAL HYSTERECTOMY    . BREAST LUMPECTOMY WITH RADIOACTIVE SEED AND SENTINEL LYMPH NODE BIOPSY Right 05/05/2016   Procedure: BREAST LUMPECTOMY WITH RADIOACTIVE SEED AND SENTINEL LYMPH NODE BIOPSY;  Surgeon: Rolm Bookbinder, MD;  Location: [facility];  Service: General;  Laterality: Right;  . CHOLECYSTECTOMY N/A 09/09/2015   Procedure: LAPAROSCOPIC CHOLECYSTECTOMY;  Surgeon: Ralene Ok, MD;  Location: [facility];  Service: General;  Laterality: N/A;  . COLONOSCOPY    . KNEE ARTHROSCOPY  2016  . PERIPHERAL VASCULAR CATHETERIZATION Right 05/05/2016   Procedure: PORTA CATH REMOVAL;  Surgeon: Rolm Bookbinder, MD;  Location: [facility];  Service: General;  Laterality: Right;  . PORTACATH PLACEMENT Right 12/29/2015   Procedure: INSERTION PORT-A-CATH WITH ULTRASOUND GUIDANCE;  Surgeon: Rolm Bookbinder, MD;  Location: [facility];  Service: General;  Laterality: Right;  . PORTACATH PLACEMENT Right 04/06/2017   Procedure: INSERTION PORT-A-CATH WITH [place];  Surgeon: Rolm Bookbinder, MD;  Location: [facility];  Service: General;  Laterality: Right;    OB History    No data available       Home Medications    Prior to Admission medications   Medication Sig Start Date End Date Taking? Authorizing Provider  ALPRAZolam Duanne Moron) 0.5 MG tablet Take 0.5 mg by mouth daily as needed for  anxiety or sleep.  03/02/17   [provider]  dexamethasone (DECADRON) 4 MG tablet Take 2 tablets (8 mg total) by mouth daily. Start the day after chemotherapy for 2 days. Take with food. 04/06/17   Magrinat, Virgie Dad, MD  EPINEPHrine (EPIPEN 2-PAK) 0.3 mg/0.3 mL IJ SOAJ injection Inject 0.3 mg into the muscle once as needed (anaphylaxis allergic reaction).   01/01/14   [provider]  fentaNYL (DURAGESIC - DOSED MCG/HR) 25 MCG/HR patch Place 1 patch (25 mcg total) onto the skin every 3 (three) days. 04/20/17   Gardenia Phlegm, NP  fexofenadine (ALLEGRA) 180 MG tablet Take 180 mg by mouth daily as needed (for allergies.).     [provider]  HYDROmorphone (DILAUDID) 2 MG tablet Take 1-2 tablets (2-4 mg total) by mouth every 4 (four) hours as needed for severe pain. Patient not taking: Reported on 04/13/2017 04/06/17   Magrinat, Virgie Dad, MD  ibuprofen (ADVIL,MOTRIN) 800 MG tablet Take 1 tablet (800 mg total) by mouth 3 (three) times daily. 04/11/17   Magrinat, Virgie Dad, MD  Ibuprofen-Diphenhydramine HCl (ADVIL PM) 200-25 MG CAPS Take 1 tablet by mouth at bedtime as needed (sleep).    [provider]  lidocaine-prilocaine (EMLA) cream Apply to affected area once 04/06/17   Magrinat, Virgie Dad, MD  lisinopril-hydrochlorothiazide (PRINZIDE,ZESTORETIC) 20-12.5 MG per tablet Take 1 tablet by mouth daily.    [provider]  NON FORMULARY 1 Syringe every 7 (seven) days. Allergy Shot    [provider]  ondansetron (ZOFRAN) 8 MG tablet Take 1 tablet (8 mg total) by mouth 2 (two) times daily as needed for refractory nausea / vomiting. Start on day 3 after chemotherapy. 04/06/17   Magrinat, Virgie Dad, MD  ondansetron (ZOFRAN-ODT) 8 MG disintegrating tablet Take 1 tablet (8 mg total) by mouth every 8 (eight) hours as needed for nausea or vomiting. 04/20/17   Causey, Charlestine Massed, NP  potassium chloride SA (K-DUR,KLOR-CON) 20 MEQ tablet Take 1 tablet (20 mEq total) by mouth once for 1 dose. 04/06/17 04/06/17  Magrinat, Virgie Dad, MD  prochlorperazine (COMPAZINE) 10 MG tablet Take 1 tablet (10 mg total) every 6 (six) hours as needed by mouth (Nausea or vomiting). 03/24/17   Magrinat, Virgie Dad, MD  venlafaxine XR (EFFEXOR-XR) 150 MG 24 hr capsule Take 1 capsule (150 mg total) by mouth daily. 04/13/17   Gardenia Phlegm, NP    Family History Family History  Problem Relation Age of Onset  . Hypertension Mother   . Breast cancer Maternal Grandfather   . Breast cancer Other   . Breast cancer Cousin   . Sudden death Neg Hx   . Diabetes Neg Hx   . Heart attack Neg Hx   . Hyperlipidemia Neg Hx   . Colon cancer Neg Hx   . Esophageal cancer Neg Hx   . Rectal cancer Neg Hx   . Stomach cancer Neg Hx     Social History Social History   Tobacco Use  . Smoking status: Never Smoker  . Smokeless tobacco: Never Used  Substance Use Topics  . Alcohol use: No    Alcohol/week: 0.0 oz  . Drug use: No     Allergies   Gabapentin; Dilaudid [hydromorphone hcl]; Percocet [oxycodone-acetaminophen]; Pollen extract; Tramadol; and Vicodin [hydrocodone-acetaminophen]   Review of Systems Review of Systems  Constitutional: Positive for fatigue. Negative for fever.  Respiratory: Negative for shortness of breath.   Cardiovascular: Negative for chest pain.  Gastrointestinal:  Negative for abdominal pain, blood in stool and vomiting.  Genitourinary: Positive for difficulty urinating.  Neurological: Positive for weakness. Negative for headaches.  All other systems reviewed and are negative.    Physical Exam Updated Vital Signs BP (!) 89/75 (BP Location: Left Arm)   Pulse (!) 112   Temp 98.7 F (37.1 C) (Oral)   Resp 19   Ht 1.676 m (5\' 6" )   Wt 77.1 kg (170 lb)   SpO2 98%   BMI 27.44 kg/m   Physical Exam CONSTITUTIONAL: Chronically ill-appearing HEAD: Normocephalic/atraumatic EYES: EOMI/PERRL, icterus noted ENMT: Mucous membranes moist NECK: supple no meningeal signs SPINE/BACK:entire spine nontender CV: S1/S2 noted, no murmurs/rubs/gallops noted LUNGS: Lungs are clear to auscultation bilaterally, no apparent distress ABDOMEN: soft, nontender, no rebound or guarding, bowel sounds noted throughout abdomen GU:no cva tenderness NEURO: Pt is awake/alert/appropriate, moves all extremitiesx4.   No facial droop.   EXTREMITIES: pulses normal/equal, full ROM SKIN: warm, color normal, Port-A-Cath in right chest, no erythema or discharge noted PSYCH: no abnormalities of mood noted, alert and oriented to situation   ED Treatments / Results  Labs (all labs ordered are listed, but only abnormal results are displayed) Labs Reviewed  BASIC METABOLIC PANEL - Abnormal; Notable for the following components:      Result Value   Potassium 2.3 (*)    Chloride 97 (*)    Glucose, Bld 104 (*)    Creatinine, Ser 1.06 (*)    Calcium 5.9 (*)    GFR calc non Af Amer 59 (*)    Anion gap 17 (*)    All other components within normal limits  PROTIME-INR - Abnormal; Notable for the following components:   Prothrombin Time 15.5 (*)    All other components within normal limits  HEPATIC FUNCTION PANEL - Abnormal; Notable for the following components:   AST 147 (*)    ALT 85 (*)    Total Bilirubin 1.8 (*)    Bilirubin, Direct 0.6 (*)    Indirect Bilirubin 1.2 (*)    All other components within normal limits  CK - Abnormal; Notable for the following components:   Total CK 557 (*)    All other components within normal limits  CBC WITH DIFFERENTIAL/PLATELET - Abnormal; Notable for the following components:   RBC 2.30 (*)    Hemoglobin 6.4 (*)    HCT 19.3 (*)    RDW 15.8 (*)    Monocytes Absolute 1.6 (*)    Basophils Absolute 0.2 (*)    All other components within normal limits  MAGNESIUM - Abnormal; Notable for the following components:   Magnesium 1.5 (*)    All other components within normal limits  I-STAT CG4 LACTIC ACID, ED - Abnormal; Notable for the following components:   Lactic Acid, Venous 1.99 (*)    All other components within normal limits  URINE CULTURE  CULTURE, BLOOD (ROUTINE X 2)  URINALYSIS, ROUTINE W REFLEX MICROSCOPIC  TYPE AND SCREEN    EKG  EKG Interpretation  Date/Time:  Tuesday April 25 2017 06:52:47 EST Ventricular Rate:  104 PR Interval:    QRS  Duration: 86 QT Interval:  390 QTC Calculation: 513 R Axis:   62 Text Interpretation:  Sinus tachycardia Borderline repolarization abnormality Prolonged QT interval Abnormal ekg Confirmed by Ripley Fraise [phone]) on 04/25/2017 6:58:55 AM       Radiology Dg Chest Portable 1 View  Result Date: 04/25/2017 CLINICAL DATA:  Initial evaluation for acute weakness. History of breast cancer.  EXAM: PORTABLE CHEST 1 VIEW COMPARISON:  Prior radiograph from 04/06/2017. FINDINGS: Right-sided Port-A-Cath in place, stable. Cardiac and mediastinal silhouettes are stable in size and contour, and remain within normal limits. Lungs are mildly hypoinflated. No focal infiltrates. No pulmonary edema or pleural effusion. No pneumothorax. No acute osseus abnormality. IMPRESSION: No active cardiopulmonary disease. Electronically Signed   By: Jeannine Boga M.D.   On: 04/25/2017 06:32    Procedures Procedures  CRITICAL CARE Performed by: Sharyon Cable Total critical care time: 40 minutes Critical care time was exclusive of separately billable procedures and treating other patients. Critical care was necessary to treat or prevent imminent or life-threatening deterioration. Critical care was time spent personally by me on the following activities: development of treatment plan with patient and/or surrogate as well as nursing, discussions with consultants, evaluation of patient's response to treatment, examination of patient, obtaining history from patient or surrogate, ordering and performing treatments and interventions, ordering and review of laboratory studies, ordering and review of radiographic studies, pulse oximetry and re-evaluation of patient's condition. Patient with hypocalcemia and hypokalemia with EKG changes requiring IV medications and admission Medications Ordered in ED Medications  sodium chloride 0.9 % bolus 1,000 mL (not administered)  calcium gluconate 1 g in sodium chloride 0.9 % 100  mL IVPB (not administered)  potassium chloride SA (K-DUR,KLOR-CON) CR tablet 40 mEq (not administered)  sodium chloride 0.9 % bolus 1,000 mL (1,000 mLs Intravenous New Bag/Given 04/25/17 0616)     Initial Impression / Assessment and Plan / ED Course  I have reviewed the triage vital signs and the nursing notes.  Pertinent labs & imaging results that were available during my care of the patient were reviewed by me and considered in my medical decision making (see chart for details).    6:35 AM Presents with difficulty urinating for the past several days, however is noted to be tachycardic and hypotensive but is afebrile Labs have resulted, and reveals she is not neutropenic at this time however she does have worsening Labs and imaging are pending at this time Bladder scan only revealed 300-400 mL of urine 6:49 AM Blood pressure is improving, patient feels improved. She is noted to be more anemic than prior labs, but she denies any recent blood loss Rest of her labs pending at this time 7:06 AM Patient is noted to have worsening hypocalcemia, hypokalemia SHe has a prolonged QT on her EKG She would need to be admitted Start IV calcium here as well as potassium 7:38 AM Discussed with Dr. Cruzita Lederer with [facility] Will admit to hospital Patient and family updated on plan Also noted to have hypomagnesemia, will need to have this replaced Final Clinical Impressions(s) / ED Diagnoses   Final diagnoses:  Dehydration  Hypocalcemia  Hypokalemia  Acute anemia  Hypomagnesemia    ED Discharge Orders    None       Ripley Fraise, MD 04/25/17 [phone]

## 2017-04-25 NOTE — Progress Notes (Signed)
[facility] CSW Progress Note  CSW received referral from RN, requesting help for patient - patient referred to home health for [facility], PT and OT services.  Was linked w [facility] who is unable to accept patient due to being full.  Patient now being admitted to [facility] inpatient from ED.  CSW spoke w RN CM team lead, Sunday Spillers, who will have ED MD put in order for home health - inpatient RN CMs will assist w linkage to home health as part of her inpatient stay.  CSW explained concern to RN CM so they are aware of issue - home health has not established services at this time w patient.  Edwyna Shell, LCSW Clinical Social Worker Phone:  [phone]

## 2017-04-25 NOTE — ED Triage Notes (Signed)
Pt presents with c/o urinary retention over the past week. Per spouse she has been drinking plenty of fluids however she has only voided 1-2x in the past 24hrs and per husband it is very dark. Patient is A&O x4, with weakness. Patient states she is currently receiving chemo for breast cancer. Denies any pain or discomfort at times. Denies any urinary discomfort. Per husband oncologist oncall requested they come in to be evaluated.

## 2017-04-25 NOTE — ED Notes (Signed)
Hospitalist at bedside 

## 2017-04-25 NOTE — Telephone Encounter (Signed)
Note to Lindsay [name] re home heatlh. Pt now admitted

## 2017-04-25 NOTE — ED Notes (Signed)
Per Dr.Wickline hold on foley catheter at this time. Dr.Wickline at bedside when performing bladder scan.

## 2017-04-25 NOTE — ED Notes (Addendum)
Pt attempted to urinate without success. Dr.Wickline notified of pt attempt and also of abnormal hemoglobin. At this time Dr.Wickline advised to hold on placing  Foley catheter due to neutropenic precautions.

## 2017-04-25 NOTE — ED Notes (Signed)
ED TO INPATIENT HANDOFF REPORT  Name/Age/Gender Lindsay [name] 51 y.o. female  Code Status    Code Status Orders  (From admission, onward)        Start     Ordered   04/25/17 1205  Full code  Continuous     04/25/17 1204    Code Status History    Date Active Date Inactive Code Status Order ID Comments User Context   This patient has a current code status but no historical code status.      Home/SNF/Other Home  Chief Complaint chemo card; urinary retention   Level of Care/Admitting Diagnosis ED Disposition    ED Disposition Condition Comment   Admit  Hospital Area: [facility] [[id number]]  Level of Care: Telemetry [5]  Admit to tele based on following criteria: Monitor QTC interval  Diagnosis: Urinary retention [315176]  Admitting Physician: Caren Griffins [[id number]]  Attending Physician: Caren Griffins [id number]  Estimated length of stay: past midnight tomorrow  Certification:: I certify this patient will need inpatient services for at least 2 midnights  PT Class (Do Not Modify): Inpatient [101]  PT Acc Code (Do Not Modify): Private [1]       Medical History Past Medical History:  Diagnosis Date  . Allergy    seasonal  . Arthritis   . Breast cancer of upper-outer quadrant of right female breast ([place]) 12/16/2015  . Depression    mild depression after death of daughter- no meds  . History of radiation therapy 06/20/17-08/04/16   right breast 50.4 gy in 28 fractions, boost 10 Gy in 5 fractions  . Hot flashes   . Hx of adenomatous colonic polyps 04/25/2014  . Hypertension     Allergies Allergies  Allergen Reactions  . Gabapentin Other (See Comments)    "light headed, dizziness, didn't like the way it made me feel"  . Dilaudid [Hydromorphone Hcl] Nausea And Vomiting  . Percocet [Oxycodone-Acetaminophen] Diarrhea and Nausea And Vomiting  . Pollen Extract Other (See Comments)    Runny nose, itchy eyes and sneezing due to seasonal allergies.   . Tramadol Nausea And Vomiting  . Vicodin [Hydrocodone-Acetaminophen] Nausea And Vomiting    IV Location/Drains/Wounds Patient Lines/Drains/Airways Status   Active Line/Drains/Airways    Name:   Placement date:   Placement time:   Site:   Days:   Implanted Port 04/06/17 Right Chest   04/06/17    0812    Chest   19   Peripheral IV 04/25/17 Left Antecubital   04/25/17    1103    Antecubital   less than 1   Urethral Catheter Megan Simmons Straight-tip 16 Fr.   04/25/17    0819    Straight-tip   less than 1   Incision (Closed) 05/05/16 Chest Right   05/05/16    1030     355   Incision (Closed) 05/05/16 Breast Right   05/05/16    1030     355   Incision (Closed) 05/05/16 Axilla Right   05/05/16    1030     355   Incision (Closed) 04/06/17 Neck Right   04/06/17    0833     19          Labs/Imaging Results for orders placed or performed during the hospital encounter of 04/25/17 (from the past 48 hour(s))  Basic metabolic panel     Status: Abnormal   Collection Time: 04/25/17  5:57 AM  Result Value Ref Range  Sodium 136 135 - 145 mmol/L   Potassium 2.3 (LL) 3.5 - 5.1 mmol/L    Comment: CRITICAL RESULT CALLED TO, READ BACK BY AND VERIFIED WITH: Rebeca Allegra RN AT 0737 04/25/17 OKOYEHJ    Chloride 97 (L) 101 - 111 mmol/L   CO2 22 22 - 32 mmol/L   Glucose, Bld 104 (H) 65 - 99 mg/dL   BUN 11 6 - 20 mg/dL   Creatinine, Ser 1.06 (H) 0.44 - 1.00 mg/dL   Calcium 5.9 (LL) 8.9 - 10.3 mg/dL    Comment: CRITICAL RESULT CALLED TO, READ BACK BY AND VERIFIED WITHRebeca Allegra RN AT 1062 04/25/17 OKOYEHJ    GFR calc non Af Amer 59 (L) >60 mL/min   GFR calc Af Amer >60 >60 mL/min    Comment: (NOTE) The eGFR has been calculated using the CKD EPI equation. This calculation has not been validated in all clinical situations. eGFR's persistently <60 mL/min signify possible Chronic Kidney Disease.    Anion gap 17 (H) 5 - 15  Protime-INR     Status: Abnormal   Collection Time: 04/25/17  5:57 AM   Result Value Ref Range   Prothrombin Time 15.5 (H) 11.4 - 15.2 seconds   INR 1.24   Hepatic function panel     Status: Abnormal   Collection Time: 04/25/17  5:57 AM  Result Value Ref Range   Total Protein 7.1 6.5 - 8.1 g/dL   Albumin 3.8 3.5 - 5.0 g/dL   AST 147 (H) 15 - 41 U/L   ALT 85 (H) 14 - 54 U/L   Alkaline Phosphatase 47 38 - 126 U/L   Total Bilirubin 1.8 (H) 0.3 - 1.2 mg/dL   Bilirubin, Direct 0.6 (H) 0.1 - 0.5 mg/dL   Indirect Bilirubin 1.2 (H) 0.3 - 0.9 mg/dL  CK     Status: Abnormal   Collection Time: 04/25/17  5:57 AM  Result Value Ref Range   Total CK 557 (H) 38 - 234 U/L  CBC with Differential/Platelet     Status: Abnormal   Collection Time: 04/25/17  5:57 AM  Result Value Ref Range   WBC 9.5 4.0 - 10.5 K/uL   RBC 2.30 (L) 3.87 - 5.11 MIL/uL   Hemoglobin 6.4 (LL) 12.0 - 15.0 g/dL    Comment: REPEATED TO VERIFY CRITICAL RESULT CALLED TO, READ BACK BY AND VERIFIED WITH: DOSTER,S RN 12.18.18 @0626  ZANDO,C    HCT 19.3 (L) 36.0 - 46.0 %   MCV 83.9 78.0 - 100.0 fL   MCH 27.8 26.0 - 34.0 pg   MCHC 33.2 30.0 - 36.0 g/dL   RDW 15.8 (H) 11.5 - 15.5 %   Platelets 316 150 - 400 K/uL   Neutrophils Relative % 67 %   Lymphocytes Relative 14 %   Monocytes Relative 17 %   Eosinophils Relative 0 %   Basophils Relative 2 %   Neutro Abs 6.4 1.7 - 7.7 K/uL   Lymphs Abs 1.3 0.7 - 4.0 K/uL   Monocytes Absolute 1.6 (H) 0.1 - 1.0 K/uL   Eosinophils Absolute 0.0 0.0 - 0.7 K/uL   Basophils Absolute 0.2 (H) 0.0 - 0.1 K/uL   WBC Morphology TOXIC GRANULATION     Comment: MILD LEFT SHIFT (1-5% METAS, OCC MYELO, OCC BANDS)  Magnesium     Status: Abnormal   Collection Time: 04/25/17  5:57 AM  Result Value Ref Range   Magnesium 1.5 (L) 1.7 - 2.4 mg/dL  I-Stat CG4 Lactic Acid, ED  Status: Abnormal   Collection Time: 04/25/17  6:12 AM  Result Value Ref Range   Lactic Acid, Venous 1.99 (H) 0.5 - 1.9 mmol/L  ABO/Rh     Status: None   Collection Time: 04/25/17  6:33 AM  Result  Value Ref Range   ABO/RH(D) O POS   Type and screen     Status: None (Preliminary result)   Collection Time: 04/25/17  6:35 AM  Result Value Ref Range   ABO/RH(D) O POS    Antibody Screen NEG    Sample Expiration 04/28/2017    Unit Number [id number]    Blood Component Type RED CELLS,LR    Unit division 00    Status of Unit ISSUED    Transfusion Status OK TO TRANSFUSE    Crossmatch Result Compatible   Prepare RBC     Status: None   Collection Time: 04/25/17  8:30 AM  Result Value Ref Range   Order Confirmation ORDER PROCESSED BY BLOOD BANK   Urinalysis, Routine w reflex microscopic- may I&O cath if menses     Status: Abnormal   Collection Time: 04/25/17  8:37 AM  Result Value Ref Range   Color, Urine YELLOW YELLOW   APPearance CLEAR CLEAR   Specific Gravity, Urine 1.008 1.005 - 1.030   pH 6.0 5.0 - 8.0   Glucose, UA NEGATIVE NEGATIVE mg/dL   Hgb urine dipstick NEGATIVE NEGATIVE   Bilirubin Urine NEGATIVE NEGATIVE   Ketones, ur 5 (A) NEGATIVE mg/dL   Protein, ur NEGATIVE NEGATIVE mg/dL   Nitrite NEGATIVE NEGATIVE   Leukocytes, UA NEGATIVE NEGATIVE   Ct Abdomen Pelvis W Contrast  Result Date: 04/25/2017 CLINICAL DATA:  Urinary retention.History of metastatic disease. EXAM: CT ABDOMEN AND PELVIS WITH CONTRAST TECHNIQUE: Multidetector CT imaging of the abdomen and pelvis was performed using the standard protocol following bolus administration of intravenous contrast. CONTRAST:  146m ISOVUE-300 IOPAMIDOL (ISOVUE-300) INJECTION 61% COMPARISON:  Abdominal MRI 03/01/2017.  CT abdomen 08/14/2015. FINDINGS: Lower chest: Heart unremarkable. Innumerable bilateral pulmonary nodules identified. Dominant nodule in the medial left lung base measures 18 x 12 mm on image 17 of series 7. Hepatobiliary: Multiple liver metastases are evident. 4.4 cm lesion in the medial segment left liver appears to be the same lesion that was about 14 mm on the previous MRI. Many of the hepatic lesions appear new  in the interval and most of the lesions range in size from 5 mm up to about 14 mm. Gallbladder surgically absent. No intrahepatic or extrahepatic biliary dilation. Pancreas: No focal mass lesion. No dilatation of the main duct. No intraparenchymal cyst. No peripancreatic edema. Spleen: No splenomegaly. No focal mass lesion. Adrenals/Urinary Tract: No adrenal nodule or mass. Kidneys unremarkable. No evidence for hydroureter. Foley catheter decompresses the urinary bladder. Stomach/Bowel: Stomach is nondistended. No gastric wall thickening. No evidence of outlet obstruction. Duodenum is normally positioned as is the ligament of Treitz. No small bowel wall thickening. No small bowel dilatation. The terminal ileum is normal. The appendix is normal. No gross colonic mass. No colonic wall thickening. No substantial diverticular change. Vascular/Lymphatic: No abdominal aortic aneurysm. No abdominal aortic atherosclerotic calcification. There is no gastrohepatic or hepatoduodenal ligament lymphadenopathy. No intraperitoneal or retroperitoneal lymphadenopathy. 11 mm short axis necrotic left common iliac lymph nodes seen image 38 series 3. 19 mm necrotic left iliac lymph node visible on image 46. Reproductive: Uterus surgically absent.  There is no adnexal mass. Other: No intraperitoneal free fluid. Musculoskeletal: Lytic bone lesions are seen scattered through  the thoracolumbar spine and bony pelvis. IMPRESSION: 1. Metastatic disease in the liver has progressed since MRI of 03/01/2017. 2. Necrotic left iliac lymphadenopathy compatible with metastatic disease. 3. Innumerable pulmonary nodules consistent with metastatic involvement. 4. Diffuse lytic bone metastases in the thoracolumbar spine and bony pelvis. 5. Foley catheter decompresses the urinary bladder. Electronically Signed   By: Misty Stanley M.D.   On: 04/25/2017 11:20   Dg Chest Portable 1 View  Result Date: 04/25/2017 CLINICAL DATA:  Initial evaluation for  acute weakness. History of breast cancer. EXAM: PORTABLE CHEST 1 VIEW COMPARISON:  Prior radiograph from 04/06/2017. FINDINGS: Right-sided Port-A-Cath in place, stable. Cardiac and mediastinal silhouettes are stable in size and contour, and remain within normal limits. Lungs are mildly hypoinflated. No focal infiltrates. No pulmonary edema or pleural effusion. No pneumothorax. No acute osseus abnormality. IMPRESSION: No active cardiopulmonary disease. Electronically Signed   By: Jeannine Boga M.D.   On: 04/25/2017 06:32    Pending Labs Unresulted Labs (From admission, onward)   Start     Ordered   04/25/17 1600  Comprehensive metabolic panel  Once,   R     04/25/17 1204   04/25/17 1600  Magnesium  Once,   R     04/25/17 1204   04/25/17 1205  HIV antibody (Routine Testing)  Once,   R     04/25/17 1204   04/25/17 0529  Culture, blood (Routine x 2)  BLOOD CULTURE X 2,   STAT     04/25/17 0530   04/25/17 0528  Urine C&S  Once,   STAT    Question:  Patient immune status  Answer:  Immunocompromised   04/25/17 0528      Vitals/Pain Today's Vitals   04/25/17 1330 04/25/17 1341 04/25/17 1400 04/25/17 1440  BP: 124/61 (!) 141/85 131/79 (!) 144/86  Pulse: (!) 102 93 99 97  Resp: (!) 23 (!) 22 17 (!) 23  Temp:  98.2 F (36.8 C)    TempSrc:  Oral    SpO2: 100% 100% 100% 100%  Weight:      Height:      PainSc:        Isolation Precautions No active isolations  Medications Medications  ALPRAZolam (XANAX) tablet 0.5 mg (not administered)  fentaNYL (DURAGESIC - dosed mcg/hr) patch 25 mcg (25 mcg Transdermal Refused 04/25/17 1235)  ondansetron (ZOFRAN-ODT) disintegrating tablet 8 mg (not administered)  prochlorperazine (COMPAZINE) tablet 10 mg (not administered)  venlafaxine XR (EFFEXOR-XR) 24 hr capsule 150 mg (not administered)  enoxaparin (LOVENOX) injection 40 mg (not administered)  0.9 % NaCl with KCl 20 mEq/ L  infusion ( Intravenous New Bag/Given 04/25/17 1348)  morphine 2  MG/ML injection 2-4 mg (not administered)  calcium gluconate 1 g in sodium chloride 0.9 % 100 mL IVPB (1 g Intravenous Not Given 04/25/17 0941)  iopamidol (ISOVUE-300) 61 % injection 100 mL (not administered)  ibuprofen (ADVIL,MOTRIN) tablet 200 mg (not administered)    And  diphenhydrAMINE (BENADRYL) capsule 25 mg (not administered)  sodium chloride 0.9 % bolus 1,000 mL (0 mLs Intravenous Stopped 04/25/17 0746)  sodium chloride 0.9 % bolus 1,000 mL (0 mLs Intravenous Stopped 04/25/17 0925)  potassium chloride SA (K-DUR,KLOR-CON) CR tablet 40 mEq (40 mEq Oral Given 04/25/17 0821)  0.9 %  sodium chloride infusion ( Intravenous Stopped 04/25/17 0928)  iopamidol (ISOVUE-300) 61 % injection (100 mLs Intravenous Contrast Given 04/25/17 1043)  magnesium sulfate IVPB 2 g 50 mL (0 g Intravenous Stopped 04/25/17 1347)  calcium gluconate 1 g in sodium chloride 0.9 % 100 mL IVPB (0 g Intravenous Stopped 04/25/17 1230)  potassium chloride SA (K-DUR,KLOR-CON) CR tablet 40 mEq (40 mEq Oral Given 04/25/17 1228)    Mobility walks with person assist

## 2017-04-25 NOTE — Care Management Note (Signed)
Case Management Note  Patient Details  Name: Lindsay [name] MRN: [id number] Date of Birth: [birth year]  CM conctacted by oncology with request for [facility] agency assistance.  They have been trying to get pt accepted for PT/OT since 04/19/2017.  CM contacted [facility] who can accept pt for PT services only.  CM spoke with [facility] and [facility] who cannot accept pt due to location and ins.  CM spoke with [facility] who confirmed acceptance.  Tim advised that they had already received a referral from oncology.  CM will follow for further needs.  Expected Discharge Date:  (unknown)               Expected Discharge Plan:  [place]  Discharge planning Services  CM Consult  Post Acute Care Choice:  Home Health Choice offered to:  Patient  HH Arranged:  PT, OT HH Agency:   [facility]  Status of Service:  In process, will continue to follow  Rae Mar, RN 04/25/2017, 1:18 PM

## 2017-04-25 NOTE — ED Notes (Signed)
Attempted to give report to 4th floor charge nurse. Will attempt to call again soon.

## 2017-04-25 NOTE — H&P (Addendum)
History and Physical    Lindsay [name] JSE:[id number] DOB: 07/25/1965 DOA: 04/25/2017  I have briefly reviewed the patient's prior medical records in [place]  PCP: Bernerd Limbo, MD  Patient coming from: home  Chief Complaint: "can't pee"  HPI: Lindsay [name] is a 51 y.o. female with medical history significant of triple negative breast cancer metastatic to lung, bone, liver, hypertension, depression, presents to the emergency room with chief complaint of inability to urinate normally for the past 4 days.  Patient tells me her symptoms have started about 4 days ago, she has been having difficulties with voiding.  She was also complaining of lower abdominal discomfort during this time.  She denies any dysuria, she denies any fever or chills.  She also has noticed that her urine has become darker than normal.  She denies any blood in the urine.  She has no abdominal pain, nausea or vomiting.  She denies any flank pain.  She experienced no chest pain, shortness of breath, lightheadedness or dizziness.  She does complain of bilateral lower extremity numbness which has been chronic.  She denies any blood in her stool.  All in the urinary retention she has no other major complaints.  Patient and her husband tell me that she has had poor p.o. intake over the last several days, however she states that she has been drinking plenty of fluids.  She is currently undergoing chemotherapy, she is followed by Dr. Jana Hakim, she was found to be neutropenic last week and has received Granix.  ED Course: In the ED she was initially found to be hypotensive and was given fluids with improvement in her blood pressure.  Blood work revealed hypokalemia with a potassium of 2.3, hypocalcemia with a potassium of 5.9, creatinine 1.0, magnesium at 1.5, her white count is 9.5, platelets are normal and she was found to have a hemoglobin of 6.4.  She was given potassium, magnesium, Foley catheter placed in the ED, and we were  asked to admit.  Review of Systems: As per HPI otherwise 10 point review of systems negative.   Past Medical History:  Diagnosis Date  . Allergy    seasonal  . Arthritis   . Breast cancer of upper-outer quadrant of right female breast ([place]) 12/16/2015  . Depression    mild depression after death of daughter- no meds  . History of radiation therapy 06/20/17-08/04/16   right breast 50.4 gy in 28 fractions, boost 10 Gy in 5 fractions  . Hot flashes   . Hx of adenomatous colonic polyps 04/25/2014  . Hypertension     Past Surgical History:  Procedure Laterality Date  . ABDOMINAL HYSTERECTOMY    . BREAST LUMPECTOMY WITH RADIOACTIVE SEED AND SENTINEL LYMPH NODE BIOPSY Right 05/05/2016   Procedure: BREAST LUMPECTOMY WITH RADIOACTIVE SEED AND SENTINEL LYMPH NODE BIOPSY;  Surgeon: Rolm Bookbinder, MD;  Location: [place];  Service: General;  Laterality: Right;  . CHOLECYSTECTOMY N/A 09/09/2015   Procedure: LAPAROSCOPIC CHOLECYSTECTOMY;  Surgeon: Ralene Ok, MD;  Location: [facility];  Service: General;  Laterality: N/A;  . COLONOSCOPY    . KNEE ARTHROSCOPY  2016  . PERIPHERAL VASCULAR CATHETERIZATION Right 05/05/2016   Procedure: PORTA CATH REMOVAL;  Surgeon: Rolm Bookbinder, MD;  Location: [place];  Service: General;  Laterality: Right;  . PORTACATH PLACEMENT Right 12/29/2015   Procedure: INSERTION PORT-A-CATH WITH ULTRASOUND GUIDANCE;  Surgeon: Rolm Bookbinder, MD;  Location: [place];  Service: General;  Laterality: Right;  .  PORTACATH PLACEMENT Right 04/06/2017   Procedure: INSERTION PORT-A-CATH WITH [place];  Surgeon: Rolm Bookbinder, MD;  Location: [place];  Service: General;  Laterality: Right;     reports that  has never smoked. she has never used smokeless tobacco. She reports that she does not drink alcohol or use drugs.  Allergies  Allergen Reactions  . Gabapentin Other (See Comments)    "light headed, dizziness, didn't like the way it made me  feel"  . Dilaudid [Hydromorphone Hcl] Nausea And Vomiting  . Percocet [Oxycodone-Acetaminophen] Diarrhea and Nausea And Vomiting  . Pollen Extract Other (See Comments)    Runny nose, itchy eyes and sneezing due to seasonal allergies.  . Tramadol Nausea And Vomiting  . Vicodin [Hydrocodone-Acetaminophen] Nausea And Vomiting    Family History  Problem Relation Age of Onset  . Hypertension Mother   . Breast cancer Maternal Grandfather   . Breast cancer Other   . Breast cancer Cousin   . Sudden death Neg Hx   . Diabetes Neg Hx   . Heart attack Neg Hx   . Hyperlipidemia Neg Hx   . Colon cancer Neg Hx   . Esophageal cancer Neg Hx   . Rectal cancer Neg Hx   . Stomach cancer Neg Hx     Prior to Admission medications   Medication Sig Start Date End Date Taking? Authorizing Provider  ALPRAZolam Duanne Moron) 0.5 MG tablet Take 0.5 mg by mouth daily as needed for anxiety or sleep.  03/02/17   [provider]  dexamethasone (DECADRON) 4 MG tablet Take 2 tablets (8 mg total) by mouth daily. Start the day after chemotherapy for 2 days. Take with food. 04/06/17   Magrinat, Virgie Dad, MD  EPINEPHrine (EPIPEN 2-PAK) 0.3 mg/0.3 mL IJ SOAJ injection Inject 0.3 mg into the muscle once as needed (anaphylaxis allergic reaction).  01/01/14   [provider]  fentaNYL (DURAGESIC - DOSED MCG/HR) 25 MCG/HR patch Place 1 patch (25 mcg total) onto the skin every 3 (three) days. 04/20/17   Gardenia Phlegm, NP  fexofenadine (ALLEGRA) 180 MG tablet Take 180 mg by mouth daily as needed (for allergies.).     [provider]  HYDROmorphone (DILAUDID) 2 MG tablet Take 1-2 tablets (2-4 mg total) by mouth every 4 (four) hours as needed for severe pain. Patient not taking: Reported on 04/13/2017 04/06/17   Magrinat, Virgie Dad, MD  ibuprofen (ADVIL,MOTRIN) 800 MG tablet Take 1 tablet (800 mg total) by mouth 3 (three) times daily. 04/11/17   Magrinat, Virgie Dad, MD  Ibuprofen-Diphenhydramine HCl  (ADVIL PM) 200-25 MG CAPS Take 1 tablet by mouth at bedtime as needed (sleep).    [provider]  lidocaine-prilocaine (EMLA) cream Apply to affected area once 04/06/17   Magrinat, Virgie Dad, MD  lisinopril-hydrochlorothiazide (PRINZIDE,ZESTORETIC) 20-12.5 MG per tablet Take 1 tablet by mouth daily.    [provider]  NON FORMULARY 1 Syringe every 7 (seven) days. Allergy Shot    [provider]  ondansetron (ZOFRAN) 8 MG tablet Take 1 tablet (8 mg total) by mouth 2 (two) times daily as needed for refractory nausea / vomiting. Start on day 3 after chemotherapy. 04/06/17   Magrinat, Virgie Dad, MD  ondansetron (ZOFRAN-ODT) 8 MG disintegrating tablet Take 1 tablet (8 mg total) by mouth every 8 (eight) hours as needed for nausea or vomiting. 04/20/17   Causey, Charlestine Massed, NP  potassium chloride SA (K-DUR,KLOR-CON) 20 MEQ tablet Take 1 tablet (20 mEq total) by mouth once  for 1 dose. 04/06/17 04/06/17  Magrinat, Virgie Dad, MD  prochlorperazine (COMPAZINE) 10 MG tablet Take 1 tablet (10 mg total) every 6 (six) hours as needed by mouth (Nausea or vomiting). 03/24/17   Magrinat, Virgie Dad, MD  venlafaxine XR (EFFEXOR-XR) 150 MG 24 hr capsule Take 1 capsule (150 mg total) by mouth daily. 04/13/17   Gardenia Phlegm, NP    Physical Exam: Vitals:   04/25/17 0531 04/25/17 0611 04/25/17 0630 04/25/17 0708  BP: (!) 83/64 (!) 89/75 (!) 93/51 108/62  Pulse: (!) 118 (!) 112 (!) 121 (!) 101  Resp: 20 19 18  (!) 24  Temp: 98.7 F (37.1 C)     TempSrc: Oral     SpO2: 95% 98% 97% 98%  Weight:      Height:          Constitutional: NAD Eyes: PERRL, lids and conjunctivae normal ENMT: Mucous membranes are moist. Neck: normal, supple Respiratory: clear to auscultation bilaterally, no wheezing, no crackles. Normal respiratory effort. No accessory muscle use.  Cardiovascular: Regular rate and rhythm, no murmurs / rubs / gallops. No extremity edema. 2+ pedal pulses.  Abdomen:  Lower abdominal discomfort to palpation, no masses palpated. Bowel sounds positive.  No CVA tenderness Musculoskeletal: no clubbing / cyanosis. Normal muscle tone.  Skin: no rashes, lesions, ulcers. No induration Neurologic: CN 2-12 grossly intact. Strength 5/5 in all 4.  Psychiatric: Normal judgment and insight. Alert and oriented x 3. Normal mood.   Labs on Admission: I have personally reviewed following labs and imaging studies  CBC: Recent Labs  Lab 04/20/17 0942 04/25/17 0557  WBC 0.6* 9.5  NEUTROABS  --  6.4  HGB 8.5* 6.4*  HCT 26.5* 19.3*  MCV 84.4 83.9  PLT 171 409   Basic Metabolic Panel: Recent Labs  Lab 04/20/17 0942 04/25/17 0557  NA 140 136  K 3.4* 2.3*  CL  --  97*  CO2 24 22  GLUCOSE 79 104*  BUN 12.3 11  CREATININE 0.7 1.06*  CALCIUM 6.2* 5.9*  MG  --  1.5*   GFR: Estimated Creatinine Clearance: 64.3 mL/min (A) (by C-G formula based on SCr of 1.06 mg/dL (H)). Liver Function Tests: Recent Labs  Lab 04/20/17 0942 04/25/17 0557  AST 135* 147*  ALT 101* 85*  ALKPHOS 43 47  BILITOT 1.29* 1.8*  PROT 7.1 7.1  ALBUMIN 3.7 3.8   No results for input(s): LIPASE, AMYLASE in the last 168 hours. No results for input(s): AMMONIA in the last 168 hours. Coagulation Profile: Recent Labs  Lab 04/25/17 0557  INR 1.24   Cardiac Enzymes: Recent Labs  Lab 04/25/17 0557  CKTOTAL 557*   BNP (last 3 results) No results for input(s): PROBNP in the last 8760 hours. HbA1C: No results for input(s): HGBA1C in the last 72 hours. CBG: No results for input(s): GLUCAP in the last 168 hours. Lipid Profile: No results for input(s): CHOL, HDL, LDLCALC, TRIG, CHOLHDL, LDLDIRECT in the last 72 hours. Thyroid Function Tests: No results for input(s): TSH, T4TOTAL, FREET4, T3FREE, THYROIDAB in the last 72 hours. Anemia Panel: No results for input(s): VITAMINB12, FOLATE, FERRITIN, TIBC, IRON, RETICCTPCT in the last 72 hours. Urine analysis:    Component Value  Date/Time   COLORURINE YELLOW 04/09/2016 1455   APPEARANCEUR CLOUDY (A) 04/09/2016 1455   LABSPEC 1.011 04/09/2016 1455   PHURINE 7.0 04/09/2016 1455   GLUCOSEU NEGATIVE 04/09/2016 1455   HGBUR NEGATIVE 04/09/2016 1455   BILIRUBINUR NEGATIVE 04/09/2016 1455   KETONESUR  NEGATIVE 04/09/2016 1455   PROTEINUR NEGATIVE 04/09/2016 1455   UROBILINOGEN 1.0 03/03/2015 1240   NITRITE NEGATIVE 04/09/2016 1455   LEUKOCYTESUR NEGATIVE 04/09/2016 1455     Radiological Exams on Admission: Dg Chest Portable 1 View  Result Date: 04/25/2017 CLINICAL DATA:  Initial evaluation for acute weakness. History of breast cancer. EXAM: PORTABLE CHEST 1 VIEW COMPARISON:  Prior radiograph from 04/06/2017. FINDINGS: Right-sided Port-A-Cath in place, stable. Cardiac and mediastinal silhouettes are stable in size and contour, and remain within normal limits. Lungs are mildly hypoinflated. No focal infiltrates. No pulmonary edema or pleural effusion. No pneumothorax. No acute osseus abnormality. IMPRESSION: No active cardiopulmonary disease. Electronically Signed   By: Jeannine Boga M.D.   On: 04/25/2017 06:32    EKG: Independently reviewed. Sinus rhythm, prolonged QT  Assessment/Plan Active Problems:   Malignant neoplasm of upper-outer quadrant of right breast in female, estrogen receptor negative ([place])   Breast cancer metastasized to bone [facility])   Liver metastases ([facility])   Lung metastases ([facility])   Urinary retention   Hypocalcemia   Hypokalemia   Hypomagnesemia   Acute urinary retention -?  Related to an infection versus medication side effects, obtain a CT scan of the abdomen and pelvis to rule out mechanical obstruction -Antibiotics and urine culture only if UA suggest infection -Place Foley catheter  Metastatic breast cancer -Continue home pain regimen -Added Dr. Jana Hakim to treatment team -She is supposed to get chemotherapy this Thursday  Hypocalcemia/hypokalemia/hypomagnesemia -Probably in  the setting of poor p.o. intake, replete aggressively, monitor on telemetry, repeat blood work this afternoon  Chemotherapy-induced anemia -No evidence of bleed, transfuse 1 unit of packed red blood cells -Monitor CBC  Prolonged QT -replete Ca, Mg, K, repeat EKG in am   DVT prophylaxis: Lovenox  Code Status: Full code  Family Communication: family at bedside Disposition Plan: admit to telemetry, likely home when ready  Consults called: non     Admission status: Inpatient     At the time of admission, it appears that the appropriate admission status for this patient is INPATIENT. This is judged to be reasonable and necessary in order to provide the required high service intensity to ensure the patient's safety given the presenting symptoms, physical exam findings, and initial radiographic and laboratory data in the context of their chronic comorbidities. Current circumstances are profound electrolyte disturbances, urinary retention, and it is felt to place patient at high risk for further clinical deterioration threatening life, limb, or organ. Moreover, it is my clinical judgment that the patient will require inpatient hospital care spanning beyond 2 midnights from the point of admission and that early discharge would result in unnecessary risk of decompensation and readmission or threat to life, limb or bodily function.   Marzetta Board, MD [facility] Pager [phone]  If 7PM-7AM, please contact night-coverage [email or web address] Password [email or web address]  04/25/2017, 8:02 AM

## 2017-04-25 NOTE — ED Notes (Signed)
Date and time results received: 04/25/17 6:28 AM (use smartphrase ".now" to insert current time)  Test: hgb Critical Value: 6.4  Name of Provider Notified: Kallie Locks, RN Christy Gentles, EDP  Orders Received? Or Actions Taken?: none

## 2017-04-25 NOTE — Progress Notes (Signed)
CRITICAL VALUE ALERT  Critical Value:  Potassium 2.7, calcium 5.3  Date & Time Notied:  04/25/2017 1907  Provider Notified: Schorr  Orders Received/Actions taken: paged doctor

## 2017-04-26 ENCOUNTER — Other Ambulatory Visit: Payer: Self-pay

## 2017-04-26 ENCOUNTER — Telehealth: Payer: Self-pay | Admitting: *Deleted

## 2017-04-26 DIAGNOSIS — R9431 Abnormal electrocardiogram [ECG] [EKG]: Secondary | ICD-10-CM

## 2017-04-26 LAB — TYPE AND SCREEN
ABO/RH(D): O POS
Antibody Screen: NEGATIVE
Unit division: 0

## 2017-04-26 LAB — HEPATIC FUNCTION PANEL
ALK PHOS: 42 U/L (ref 38–126)
ALT: 65 U/L — AB (ref 14–54)
AST: 117 U/L — AB (ref 15–41)
Albumin: 3.3 g/dL — ABNORMAL LOW (ref 3.5–5.0)
BILIRUBIN DIRECT: 0.5 mg/dL (ref 0.1–0.5)
BILIRUBIN TOTAL: 1.3 mg/dL — AB (ref 0.3–1.2)
Indirect Bilirubin: 0.8 mg/dL (ref 0.3–0.9)
Total Protein: 6.2 g/dL — ABNORMAL LOW (ref 6.5–8.1)

## 2017-04-26 LAB — BASIC METABOLIC PANEL
ANION GAP: 10 (ref 5–15)
BUN: 7 mg/dL (ref 6–20)
CALCIUM: 5.9 mg/dL — AB (ref 8.9–10.3)
CHLORIDE: 105 mmol/L (ref 101–111)
CO2: 24 mmol/L (ref 22–32)
Creatinine, Ser: 0.58 mg/dL (ref 0.44–1.00)
GFR calc Af Amer: >60 mL/min (ref >60)
GFR calc non Af Amer: >60 mL/min (ref >60)
GLUCOSE: 95 mg/dL (ref 65–99)
Potassium: 3 mmol/L — ABNORMAL LOW (ref 3.5–5.1)
Sodium: 139 mmol/L (ref 135–145)

## 2017-04-26 LAB — BPAM RBC
BLOOD PRODUCT EXPIRATION DATE: 201901122359
ISSUE DATE / TIME: 201812180905
Unit Type and Rh: 5100

## 2017-04-26 LAB — URINE CULTURE: Culture: NO GROWTH

## 2017-04-26 LAB — HIV ANTIBODY (ROUTINE TESTING W REFLEX): HIV Screen 4th Generation wRfx: NONREACTIVE

## 2017-04-26 MED ORDER — POTASSIUM CHLORIDE 20 MEQ/15ML (10%) PO SOLN
40.0000 meq | Freq: Two times a day (BID) | ORAL | Status: AC
  Filled 2017-04-26 (×3): qty 30

## 2017-04-26 MED ORDER — CALCIUM CARBONATE 1250 (500 CA) MG PO TABS
1.0000 | ORAL_TABLET | Freq: Three times a day (TID) | ORAL | Status: AC
  Administered 2017-04-26 – 2017-04-28 (×4): 500 mg via ORAL
  Filled 2017-04-26 (×5): qty 1

## 2017-04-26 MED ORDER — CALCIUM CARBONATE ANTACID 1250 MG/5ML PO SUSP
1000.0000 mg | Freq: Once | ORAL | Status: DC
  Filled 2017-04-26 (×2): qty 10

## 2017-04-26 MED ORDER — LORAZEPAM 2 MG/ML IJ SOLN
0.5000 mg | Freq: Four times a day (QID) | INTRAMUSCULAR | Status: DC | PRN
  Administered 2017-04-26 – 2017-04-27 (×3): 0.5 mg via INTRAVENOUS
  Filled 2017-04-26 (×4): qty 1

## 2017-04-26 MED ORDER — MAGNESIUM OXIDE 400 (241.3 MG) MG PO TABS
400.0000 mg | ORAL_TABLET | Freq: Two times a day (BID) | ORAL | Status: AC
  Administered 2017-04-26: 400 mg via ORAL
  Filled 2017-04-26 (×2): qty 1

## 2017-04-26 MED ORDER — MAGNESIUM SULFATE 2 GM/50ML IV SOLN
2.0000 g | Freq: Once | INTRAVENOUS | Status: AC
  Administered 2017-04-26: 2 g via INTRAVENOUS
  Filled 2017-04-26: qty 50

## 2017-04-26 MED ORDER — POTASSIUM CHLORIDE CRYS ER 20 MEQ PO TBCR
40.0000 meq | EXTENDED_RELEASE_TABLET | Freq: Three times a day (TID) | ORAL | Status: DC
  Administered 2017-04-26: 40 meq via ORAL
  Filled 2017-04-26: qty 2

## 2017-04-26 MED ORDER — SODIUM CHLORIDE 0.9 % IV SOLN
INTRAVENOUS | Status: DC
  Administered 2017-04-26 – 2017-04-27 (×3): via INTRAVENOUS
  Filled 2017-04-26 (×6): qty 1000

## 2017-04-26 MED ORDER — POTASSIUM CHLORIDE 10 MEQ/100ML IV SOLN
10.0000 meq | INTRAVENOUS | Status: AC
  Administered 2017-04-26 – 2017-04-27 (×4): 10 meq via INTRAVENOUS
  Filled 2017-04-26 (×4): qty 100

## 2017-04-26 NOTE — Progress Notes (Signed)
Patient Calcium is 5.9, MD is aware. Patient has been given one time dose of IV Calcium this morning. RN has tried to give patient PO Calcium and Potassium. Patient unable to take and continues to throw up. RN has explained the importance of taking the medications because her levels are critical, patient has tried but can not keep down. Patient is nauseous/vomiting still and MD is aware. MD has ordered IV Ativan to help with nausea due to it being one of the only medications where it doesn't have a chance of prolonging the QTc. Pt has IV fluids with Potassium going at 100. Will continue to monitor patient and electrolyte levels.  Carmela Hurt, RN

## 2017-04-26 NOTE — Progress Notes (Signed)
Patient had a critical EKG this morning showing a prolonged QTc. On call made aware. No new orders given.

## 2017-04-26 NOTE — Progress Notes (Signed)
   04/26/17 1600  Clinical Encounter Type  Visited With Patient and family together  Visit Type Follow-up  Referral From Chaplain  Consult/Referral To Chaplain  Spiritual Encounters  Spiritual Needs Emotional  Stress Factors  Family Stress Factors (mom appeared veru worried)   Following up on a referral.  Patient was in the bed with her mother by her side, she appeared to be getting sick and I asked if she would like me to come back and she nodded yes.  Went back and mom was in the hall and very teary.  Patient was resting and today was not a good day for a visit.  Will have Chaplain tomorrow follow up [place]

## 2017-04-26 NOTE — Progress Notes (Signed)
   04/26/17 1000  Clinical Encounter Type  Visited With Patient not available  Visit Type Initial;Psychological support;Spiritual support  Referral From Nurse  Consult/Referral To Chaplain  Spiritual Encounters  Spiritual Needs Emotional;Other (Comment) (Spiritual Care conversation/Support)  Stress Factors  Patient Stress Factors Not reviewed   I went to visit with the patient per referral from the nurse. The patient was not available due to having a test done. Spiritual Care will follow-up with the patient at a later time.   Bell M.Div.

## 2017-04-26 NOTE — Progress Notes (Signed)
[facility] PROGRESS NOTE    Progress Note  Lindsay [name]  VHQ:[id number] DOB: 01/13/1966 DOA: 04/25/2017 PCP: Bernerd Limbo, MD     Brief Narrative:   Lindsay [name] is an 51 y.o. female past medical history significant for triple negative stage IV breast, depression presents to the ED with inability to urinate started 4 days prior to admission, accompanied by lower abdominal discomfort, concentrated urine nonbloody.  Denies any nausea vomiting or abdominal pain  Assessment/Plan:   Acute  Urinary retention: CT scan of the abdomen and pelvis does not show any obstruction. She is on Advil PM which contains diphenhydramine. Foley was placed in the ED,  Prolonged QT: Likely due to electrolyte derangements.  Admission her QTC was 519, this morning is 589. We will give stat IV potassium and calcium . Will give stat supplementation of oral potassium and calcium.  Will check again this afternoon. Discontinue Compazine, Zofran, Phenergan and Effexor.  As all these can prolong her QTC even further.  Hypocalcemia: Will replete IV and oral stat was started orally recheck in there afternoon.  Hypokalemia: Will replete IV and oral stat recheck in this afternoon.  Stage IV triple negative breast cancer: Follow-up with Dr. Jana Hakim as an outpatient.  Hypomagnasemia: Will replete orally.  Elevated LFTs: Unclear etiology, 2:1 split question medication vs ETOH, alk phos normal. Mild elevation of Billi, will fractionate.  Chemotherapy-induced anemia: Status post 1 unit of packed red blood cells no evidence of bleeding.   DVT prophylaxis: lovenox Family Communication:none Disposition Plan/Barrier to D/C: unable to detemrine Code Status:     Code Status Orders  (From admission, onward)        Start     Ordered   04/25/17 1205  Full code  Continuous     04/25/17 1204    Code Status History    Date Active Date Inactive Code Status Order ID Comments User Context   This  patient has a current code status but no historical code status.        IV Access:    Peripheral IV   Procedures and diagnostic studies:   Ct Abdomen Pelvis W Contrast  Result Date: 04/25/2017 CLINICAL DATA:  Urinary retention.History of metastatic disease. EXAM: CT ABDOMEN AND PELVIS WITH CONTRAST TECHNIQUE: Multidetector CT imaging of the abdomen and pelvis was performed using the standard protocol following bolus administration of intravenous contrast. CONTRAST:  165m ISOVUE-300 IOPAMIDOL (ISOVUE-300) INJECTION 61% COMPARISON:  Abdominal MRI 03/01/2017.  CT abdomen 08/14/2015. FINDINGS: Lower chest: Heart unremarkable. Innumerable bilateral pulmonary nodules identified. Dominant nodule in the medial left lung base measures 18 x 12 mm on image 17 of series 7. Hepatobiliary: Multiple liver metastases are evident. 4.4 cm lesion in the medial segment left liver appears to be the same lesion that was about 14 mm on the previous MRI. Many of the hepatic lesions appear new in the interval and most of the lesions range in size from 5 mm up to about 14 mm. Gallbladder surgically absent. No intrahepatic or extrahepatic biliary dilation. Pancreas: No focal mass lesion. No dilatation of the main duct. No intraparenchymal cyst. No peripancreatic edema. Spleen: No splenomegaly. No focal mass lesion. Adrenals/Urinary Tract: No adrenal nodule or mass. Kidneys unremarkable. No evidence for hydroureter. Foley catheter decompresses the urinary bladder. Stomach/Bowel: Stomach is nondistended. No gastric wall thickening. No evidence of outlet obstruction. Duodenum is normally positioned as is the ligament of Treitz. No small bowel wall thickening. No small bowel dilatation. The terminal ileum  is normal. The appendix is normal. No gross colonic mass. No colonic wall thickening. No substantial diverticular change. Vascular/Lymphatic: No abdominal aortic aneurysm. No abdominal aortic atherosclerotic calcification.  There is no gastrohepatic or hepatoduodenal ligament lymphadenopathy. No intraperitoneal or retroperitoneal lymphadenopathy. 11 mm short axis necrotic left common iliac lymph nodes seen image 38 series 3. 19 mm necrotic left iliac lymph node visible on image 46. Reproductive: Uterus surgically absent.  There is no adnexal mass. Other: No intraperitoneal free fluid. Musculoskeletal: Lytic bone lesions are seen scattered through the thoracolumbar spine and bony pelvis. IMPRESSION: 1. Metastatic disease in the liver has progressed since MRI of 03/01/2017. 2. Necrotic left iliac lymphadenopathy compatible with metastatic disease. 3. Innumerable pulmonary nodules consistent with metastatic involvement. 4. Diffuse lytic bone metastases in the thoracolumbar spine and bony pelvis. 5. Foley catheter decompresses the urinary bladder. Electronically Signed   By: Misty Stanley M.D.   On: 04/25/2017 11:20   Dg Chest Portable 1 View  Result Date: 04/25/2017 CLINICAL DATA:  Initial evaluation for acute weakness. History of breast cancer. EXAM: PORTABLE CHEST 1 VIEW COMPARISON:  Prior radiograph from 04/06/2017. FINDINGS: Right-sided Port-A-Cath in place, stable. Cardiac and mediastinal silhouettes are stable in size and contour, and remain within normal limits. Lungs are mildly hypoinflated. No focal infiltrates. No pulmonary edema or pleural effusion. No pneumothorax. No acute osseus abnormality. IMPRESSION: No active cardiopulmonary disease. Electronically Signed   By: Jeannine Boga M.D.   On: 04/25/2017 06:32     Medical Consultants:    None.  Anti-Infectives:   None  Subjective:    Lindsay [name] she relates she feels fine she admit to taking compazine and advil PM  Objective:    Vitals:   04/25/17 1530 04/25/17 1705 04/25/17 2027 04/26/17 0527  BP: 139/75 122/87 121/67 119/63  Pulse:  (!) 101 (!) 105 97  Resp: _0 Temp:  99.2 F (37.3 C) 98.5 F (36.9 C) 98.1 F (36.7 C)    TempSrc:  Oral Oral Oral  SpO2:   100% 96%  Weight:  77.7 kg (171 lb 4.8 oz)    Height:  _1  (1.676 m)      Intake/Output Summary (Last 24 hours) at 04/26/2017 0848 Last data filed at 04/26/2017 0817 Gross per 24 hour  Intake 1635 ml  Output 1300 ml  Net 335 ml   Filed Weights   04/25/17 0527 04/25/17 1705  Weight: 77.1 kg (170 lb) 77.7 kg (171 lb 4.8 oz)    Exam: General exam: In no acute distress. Respiratory system: Good air movement and clear to auscultation. Cardiovascular system: S1 & S2 heard, RRR.  Gastrointestinal system: Abdomen is nondistended, soft and nontender.  Central nervous system: Alert and oriented. No focal neurological deficits. Extremities: No pedal edema. Skin: No rashes, lesions or ulcers Psychiatry: Judgement and insight appear normal. Mood & affect appropriate.    Data Reviewed:    Labs: Basic Metabolic Panel: Recent Labs  Lab 04/20/17 0942 04/25/17 0557 04/25/17 1752  NA 140 136 138  K 3.4* 2.3* 2.7*  CL  --  97* 105  CO2 _2 GLUCOSE 79 104* 104*  BUN 12._3 CREATININE 0.7 1.06* 0.70  CALCIUM 6.2* 5.9* 5.3*  MG  --  1.5* 1.7   GFR Estimated Creatinine Clearance: 85.6 mL/min (by C-G formula based on SCr of 0.7 mg/dL). Liver Function Tests: Recent Labs  Lab 04/20/17 0942 04/25/17 0557 04/25/17 1752  AST 135* 147*  117*  ALT 101* 85* 68*  ALKPHOS 43 47 42  BILITOT 1.29* 1.8* 1.6*  PROT 7.1 7.1 6.1*  ALBUMIN 3.7 3.8 3.2*   No results for input(s): LIPASE, AMYLASE in the last 168 hours. No results for input(s): AMMONIA in the last 168 hours. Coagulation profile Recent Labs  Lab 04/25/17 0557  INR 1.24    CBC: Recent Labs  Lab 04/20/17 0942 04/25/17 0557  WBC 0.6* 9.5  NEUTROABS  --  6.4  HGB 8.5* 6.4*  HCT 26.5* 19.3*  MCV 84.4 83.9  PLT 171 316   Cardiac Enzymes: Recent Labs  Lab 04/25/17 0557  CKTOTAL 557*   BNP (last 3 results) No results for input(s): PROBNP in the last 8760  hours. CBG: No results for input(s): GLUCAP in the last 168 hours. D-Dimer: No results for input(s): DDIMER in the last 72 hours. Hgb A1c: No results for input(s): HGBA1C in the last 72 hours. Lipid Profile: No results for input(s): CHOL, HDL, LDLCALC, TRIG, CHOLHDL, LDLDIRECT in the last 72 hours. Thyroid function studies: No results for input(s): TSH, T4TOTAL, T3FREE, THYROIDAB in the last 72 hours.  Invalid input(s): FREET3 Anemia work up: No results for input(s): VITAMINB12, FOLATE, FERRITIN, TIBC, IRON, RETICCTPCT in the last 72 hours. Sepsis Labs: Recent Labs  Lab 04/20/17 0942 04/25/17 0557 04/25/17 0612  WBC 0.6* 9.5  --   LATICACIDVEN  --   --  1.99*   Microbiology No results found for this or any previous visit (from the past 240 hour(s)).   Medications:   . chlorhexidine  15 mL Mouth Rinse BID  . enoxaparin (LOVENOX) injection  40 mg Subcutaneous Q24H  . fentaNYL  25 mcg Transdermal Q72H  . mouth rinse  15 mL Mouth Rinse q12n4p  . venlafaxine XR  150 mg Oral Daily   Continuous Infusions: . 0.9 % NaCl with KCl 20 mEq / L 75 mL/hr at 04/26/17 0233  . calcium gluconate      LOS: 1 day   Charlynne Cousins  [facility] Pager [phone]  *Please refer to [email or web address], password [email or web address] to get updated schedule on who will round on this patient, as hospitalists switch teams weekly. If 7PM-7AM, please contact night-coverage at [email or web address], password [email or web address] for any overnight needs.  04/26/2017, 8:48 AM

## 2017-04-27 ENCOUNTER — Ambulatory Visit: Payer: BLUE CROSS/BLUE SHIELD | Admitting: Oncology

## 2017-04-27 ENCOUNTER — Inpatient Hospital Stay (HOSPITAL_COMMUNITY): Payer: BLUE CROSS/BLUE SHIELD

## 2017-04-27 ENCOUNTER — Ambulatory Visit: Payer: BLUE CROSS/BLUE SHIELD

## 2017-04-27 ENCOUNTER — Other Ambulatory Visit: Payer: BLUE CROSS/BLUE SHIELD

## 2017-04-27 DIAGNOSIS — R531 Weakness: Secondary | ICD-10-CM

## 2017-04-27 DIAGNOSIS — E649 Sequelae of unspecified nutritional deficiency: Secondary | ICD-10-CM

## 2017-04-27 LAB — CBC WITH DIFFERENTIAL/PLATELET
BASOS ABS: 0.1 10*3/uL (ref 0.0–0.1)
Basophils Relative: 1 %
EOS ABS: 0 10*3/uL (ref 0.0–0.7)
Eosinophils Relative: 0 %
HCT: 25.3 % — ABNORMAL LOW (ref 36.0–46.0)
HEMOGLOBIN: 8.4 g/dL — AB (ref 12.0–15.0)
LYMPHS PCT: 9 %
Lymphs Abs: 0.7 10*3/uL (ref 0.7–4.0)
MCH: 28.6 pg (ref 26.0–34.0)
MCHC: 33.2 g/dL (ref 30.0–36.0)
MCV: 86.1 fL (ref 78.0–100.0)
MONO ABS: 1.2 10*3/uL — AB (ref 0.1–1.0)
Monocytes Relative: 16 %
NEUTROS PCT: 74 %
Neutro Abs: 5.7 10*3/uL (ref 1.7–7.7)
PLATELETS: 149 10*3/uL — AB (ref 150–400)
RBC: 2.94 MIL/uL — AB (ref 3.87–5.11)
RDW: 17.1 % — ABNORMAL HIGH (ref 11.5–15.5)
WBC: 7.7 10*3/uL (ref 4.0–10.5)

## 2017-04-27 LAB — BASIC METABOLIC PANEL
ANION GAP: 6 (ref 5–15)
BUN: <5 mg/dL — ABNORMAL LOW (ref 6–20)
CALCIUM: 5.9 mg/dL — AB (ref 8.9–10.3)
CO2: 23 mmol/L (ref 22–32)
Chloride: 111 mmol/L (ref 101–111)
Creatinine, Ser: 0.58 mg/dL (ref 0.44–1.00)
GLUCOSE: 72 mg/dL (ref 65–99)
POTASSIUM: 4.2 mmol/L (ref 3.5–5.1)
Sodium: 140 mmol/L (ref 135–145)

## 2017-04-27 LAB — RETICULOCYTES
RBC.: 2.94 MIL/uL — AB (ref 3.87–5.11)
RETIC CT PCT: 2.7 % (ref 0.4–3.1)
Retic Count, Absolute: 79.4 10*3/uL (ref 19.0–186.0)

## 2017-04-27 LAB — DIRECT ANTIGLOBULIN TEST (NOT AT ARMC)
DAT, COMPLEMENT: NEGATIVE
DAT, IGG: NEGATIVE

## 2017-04-27 LAB — FOLATE: Folate: 11.1 ng/mL (ref >5.9)

## 2017-04-27 LAB — SAVE SMEAR

## 2017-04-27 LAB — LACTATE DEHYDROGENASE: LDH: 4694 U/L — AB (ref 98–192)

## 2017-04-27 LAB — VITAMIN B12: VITAMIN B 12: 1262 pg/mL — AB (ref 180–914)

## 2017-04-27 LAB — FERRITIN: Ferritin: 1982 ng/mL — ABNORMAL HIGH (ref 11–307)

## 2017-04-27 LAB — MAGNESIUM: Magnesium: 2 mg/dL (ref 1.7–2.4)

## 2017-04-27 MED ORDER — KETOROLAC TROMETHAMINE 30 MG/ML IJ SOLN
30.0000 mg | Freq: Four times a day (QID) | INTRAMUSCULAR | Status: DC | PRN
  Administered 2017-04-27 – 2017-04-28 (×4): 30 mg via INTRAVENOUS
  Filled 2017-04-27 (×4): qty 1

## 2017-04-27 MED ORDER — NALOXONE HCL 0.4 MG/ML IJ SOLN: 0.4000 mg | INTRAMUSCULAR | Status: DC | PRN

## 2017-04-27 MED ORDER — FENTANYL 40 MCG/ML IV SOLN
INTRAVENOUS | Status: DC
  Administered 2017-04-27: 19:00:00 via INTRAVENOUS
  Filled 2017-04-27: qty 25

## 2017-04-27 MED ORDER — SODIUM CHLORIDE 0.9 % IV SOLN
1.0000 g | Freq: Once | INTRAVENOUS | Status: AC
  Administered 2017-04-27: 1 g via INTRAVENOUS
  Filled 2017-04-27: qty 10

## 2017-04-27 MED ORDER — CALCIUM GLUCONATE 10 % IV SOLN
1.0000 g | Freq: Once | INTRAVENOUS | Status: AC
  Administered 2017-04-27: 1 g via INTRAVENOUS
  Filled 2017-04-27 (×2): qty 10

## 2017-04-27 MED ORDER — FENTANYL CITRATE (PF) 100 MCG/2ML IJ SOLN
25.0000 ug | INTRAMUSCULAR | Status: DC | PRN
  Administered 2017-04-27: 25 ug via INTRAVENOUS
  Filled 2017-04-27: qty 2

## 2017-04-27 MED ORDER — ONDANSETRON HCL 4 MG/2ML IJ SOLN: 4.0000 mg | Freq: Four times a day (QID) | INTRAMUSCULAR | Status: DC | PRN

## 2017-04-27 MED ORDER — POTASSIUM CHLORIDE IN NACL 40-0.9 MEQ/L-% IV SOLN
INTRAVENOUS | Status: DC
  Administered 2017-04-27 – 2017-04-28 (×3): 100 mL/h via INTRAVENOUS
  Filled 2017-04-27 (×5): qty 1000

## 2017-04-27 MED ORDER — DIPHENHYDRAMINE HCL 50 MG/ML IJ SOLN: 12.5000 mg | Freq: Four times a day (QID) | INTRAMUSCULAR | Status: DC | PRN

## 2017-04-27 MED ORDER — FENTANYL 40 MCG/ML IV SOLN
INTRAVENOUS | Status: AC
  Administered 2017-04-28: 138.4 ug via INTRAVENOUS
  Administered 2017-04-28: 172.2 ug via INTRAVENOUS

## 2017-04-27 MED ORDER — FENTANYL CITRATE (PF) 100 MCG/2ML IJ SOLN: 50.0000 ug | INTRAMUSCULAR | Status: DC | PRN

## 2017-04-27 MED ORDER — DIPHENHYDRAMINE HCL 12.5 MG/5ML PO ELIX: 12.5000 mg | ORAL_SOLUTION | Freq: Four times a day (QID) | ORAL | Status: DC | PRN

## 2017-04-27 MED ORDER — FENTANYL 12 MCG/HR TD PT72
12.5000 ug | MEDICATED_PATCH | TRANSDERMAL | Status: DC
  Administered 2017-04-27: 12.5 ug via TRANSDERMAL
  Filled 2017-04-27: qty 1

## 2017-04-27 MED ORDER — FENTANYL CITRATE (PF) 100 MCG/2ML IJ SOLN
12.5000 ug | INTRAMUSCULAR | Status: DC | PRN
  Administered 2017-04-27: 12.5 ug via INTRAVENOUS
  Filled 2017-04-27: qty 2

## 2017-04-27 MED ORDER — SODIUM CHLORIDE 0.9% FLUSH: 9.0000 mL | INTRAVENOUS | Status: DC | PRN

## 2017-04-27 MED ORDER — HYDROCODONE-ACETAMINOPHEN 10-325 MG PO TABS
1.0000 | ORAL_TABLET | Freq: Once | ORAL | Status: AC | PRN
  Administered 2017-04-27: 2 via ORAL
  Filled 2017-04-27: qty 2

## 2017-04-27 MED ORDER — KETOROLAC TROMETHAMINE 15 MG/ML IJ SOLN
30.0000 mg | Freq: Three times a day (TID) | INTRAMUSCULAR | Status: DC | PRN
Start: 2017-04-27 — End: 2017-04-27
  Administered 2017-04-27: 30 mg via INTRAVENOUS
  Filled 2017-04-27: qty 2

## 2017-04-27 MED ORDER — CALCIUM GLUCONATE 10 % IV SOLN
1.0000 g | Freq: Once | INTRAVENOUS | Status: AC
  Administered 2017-04-27: 1 g via INTRAVENOUS
  Filled 2017-04-27: qty 10

## 2017-04-27 MED ORDER — PROMETHAZINE HCL 25 MG/ML IJ SOLN
12.5000 mg | Freq: Once | INTRAMUSCULAR | Status: AC
  Administered 2017-04-27: 12.5 mg via INTRAVENOUS
  Filled 2017-04-27: qty 1

## 2017-04-27 NOTE — Progress Notes (Signed)
Pt is complaining of pain 10 out of 10, patient has Toradol for PRN for mild pain. Have given it twice to patient and has not worked for her at all. Patient is normally on dilaudid 2-4 mg every 4 hours PRN for pain. Have tried paging MD on call for something that can help her but no answer yet. Will continue to try and get something for patient, in the mean time gave her that second dose of Toradol. Patient currently in room sitting on the side of the bed rocking in tears saying she's in so much pain. Family is very upset. RN trying other measures to help relieve patient's pain.  Carmela Hurt, RN

## 2017-04-27 NOTE — Progress Notes (Signed)
[facility] PROGRESS NOTE    Progress Note  Lindsay [name]  EGB:[id number] DOB: 1966-03-10 DOA: 04/25/2017 PCP: Bernerd Limbo, MD     Brief Narrative:   Lindsay [name] is an 51 y.o. female past medical history significant for triple negative stage IV breast, depression presents to the ED with inability to urinate started 4 days prior to admission, accompanied by lower abdominal discomfort, concentrated urine nonbloody.  Denies any nausea vomiting or abdominal pain  Assessment/Plan:   Acute  Urinary retention: CT scan of the abdomen and pelvis does not show any obstruction. She is on Advil PM which contains diphenhydramine, and probably Dilaudid, in the setting of electrolytes imbalance. Appreciate oncology's assistance.  Will DC Foley.  Prolonged QT: Likely due to electrolyte derangements.  QTC continues to improve.  Continue replete Potassium and magnesium and calcium IV and oral. Discontinue Compazine, Zofran, Phenergan, and resume Effexor.  Hypocalcemia: Replete orally and IV. Likely due to to denosumab  Hypokalemia: Repleted IV and orally now resolved.  Stage IV triple negative breast cancer: Follow-up with Dr. Jana Hakim as an outpatient.  Hypomagnasemia: Resolved.  Elevated LFTs: Improving likely due to nausea and vomiting.  Chemotherapy-induced anemia: Status post 1 unit of packed red blood cells no evidence of bleeding.   DVT prophylaxis: lovenox Family Communication:none Disposition Plan/Barrier to D/C: Hopefully in the morning. Code Status:     Code Status Orders  (From admission, onward)        Start     Ordered   04/25/17 1205  Full code  Continuous     04/25/17 1204    Code Status History    Date Active Date Inactive Code Status Order ID Comments User Context   This patient has a current code status but no historical code status.        IV Access:    Peripheral IV   Procedures and diagnostic studies:   Ct Abdomen Pelvis W  Contrast  Result Date: 04/25/2017 CLINICAL DATA:  Urinary retention.History of metastatic disease. EXAM: CT ABDOMEN AND PELVIS WITH CONTRAST TECHNIQUE: Multidetector CT imaging of the abdomen and pelvis was performed using the standard protocol following bolus administration of intravenous contrast. CONTRAST:  156mL ISOVUE-300 IOPAMIDOL (ISOVUE-300) INJECTION 61% COMPARISON:  Abdominal MRI 03/01/2017.  CT abdomen 08/14/2015. FINDINGS: Lower chest: Heart unremarkable. Innumerable bilateral pulmonary nodules identified. Dominant nodule in the medial left lung base measures 18 x 12 mm on image 17 of series 7. Hepatobiliary: Multiple liver metastases are evident. 4.4 cm lesion in the medial segment left liver appears to be the same lesion that was about 14 mm on the previous MRI. Many of the hepatic lesions appear new in the interval and most of the lesions range in size from 5 mm up to about 14 mm. Gallbladder surgically absent. No intrahepatic or extrahepatic biliary dilation. Pancreas: No focal mass lesion. No dilatation of the main duct. No intraparenchymal cyst. No peripancreatic edema. Spleen: No splenomegaly. No focal mass lesion. Adrenals/Urinary Tract: No adrenal nodule or mass. Kidneys unremarkable. No evidence for hydroureter. Foley catheter decompresses the urinary bladder. Stomach/Bowel: Stomach is nondistended. No gastric wall thickening. No evidence of outlet obstruction. Duodenum is normally positioned as is the ligament of Treitz. No small bowel wall thickening. No small bowel dilatation. The terminal ileum is normal. The appendix is normal. No gross colonic mass. No colonic wall thickening. No substantial diverticular change. Vascular/Lymphatic: No abdominal aortic aneurysm. No abdominal aortic atherosclerotic calcification. There is no gastrohepatic or hepatoduodenal ligament lymphadenopathy.  No intraperitoneal or retroperitoneal lymphadenopathy. 11 mm short axis necrotic left common iliac lymph  nodes seen image 38 series 3. 19 mm necrotic left iliac lymph node visible on image 46. Reproductive: Uterus surgically absent.  There is no adnexal mass. Other: No intraperitoneal free fluid. Musculoskeletal: Lytic bone lesions are seen scattered through the thoracolumbar spine and bony pelvis. IMPRESSION: 1. Metastatic disease in the liver has progressed since MRI of 03/01/2017. 2. Necrotic left iliac lymphadenopathy compatible with metastatic disease. 3. Innumerable pulmonary nodules consistent with metastatic involvement. 4. Diffuse lytic bone metastases in the thoracolumbar spine and bony pelvis. 5. Foley catheter decompresses the urinary bladder. Electronically Signed   By: Misty Stanley M.D.   On: 04/25/2017 11:20     Medical Consultants:    None.  Anti-Infectives:   None  Subjective:    Lindsay [name] patient was much better than yesterday no nausea.  Objective:    Vitals:   04/26/17 2057 04/26/17 2127 04/27/17 0409 04/27/17 0605  BP: 140/83  138/78   Pulse: (!) 117 (!) 110 (!) 120 (!) 106  Resp: 18  18   Temp: 97.8 F (36.6 C)  98.5 F (36.9 C)   TempSrc: Oral  Oral   SpO2: 100%  100%   Weight:      Height:        Intake/Output Summary (Last 24 hours) at 04/27/2017 0945 Last data filed at 04/27/2017 0829 Gross per 24 hour  Intake 2971.67 ml  Output 1500 ml  Net 1471.67 ml   Filed Weights   04/25/17 0527 04/25/17 1705  Weight: 77.1 kg (170 lb) 77.7 kg (171 lb 4.8 oz)    Exam: General exam: In no acute distress. Respiratory system: Good air movement and clear to auscultation. Cardiovascular system: S1 & S2 heard, RRR.  Gastrointestinal system: Abdomen is nondistended, soft and nontender.  Central nervous system: Alert and oriented. No focal neurological deficits. Extremities: No pedal edema. Skin: No rashes, lesions or ulcers Psychiatry: Judgement and insight appear normal. Mood & affect appropriate.    Data Reviewed:    Labs: Basic Metabolic  Panel: Recent Labs  Lab 04/25/17 0557 04/25/17 1752 04/26/17 1021 04/27/17 0346  NA 136 138 139 140  K 2.3* 2.7* 3.0* 4.2  CL 97* 105 105 111  CO2 22 23 24 23   GLUCOSE 104* 104* 95 72  BUN 11 9 7  <5*  CREATININE 1.06* 0.70 0.58 0.58  CALCIUM 5.9* 5.3* 5.9* 5.9*  MG 1.5* 1.7  --  2.0   GFR Estimated Creatinine Clearance: 85.6 mL/min (by C-G formula based on SCr of 0.58 mg/dL). Liver Function Tests: Recent Labs  Lab 04/25/17 0557 04/25/17 1752 04/26/17 1021  AST 147* 117* 117*  ALT 85* 68* 65*  ALKPHOS 47 42 42  BILITOT 1.8* 1.6* 1.3*  PROT 7.1 6.1* 6.2*  ALBUMIN 3.8 3.2* 3.3*   No results for input(s): LIPASE, AMYLASE in the last 168 hours. No results for input(s): AMMONIA in the last 168 hours. Coagulation profile Recent Labs  Lab 04/25/17 0557  INR 1.24    CBC: Recent Labs  Lab 04/25/17 0557  WBC 9.5  NEUTROABS 6.4  HGB 6.4*  HCT 19.3*  MCV 83.9  PLT 316   Cardiac Enzymes: Recent Labs  Lab 04/25/17 0557  CKTOTAL 557*   BNP (last 3 results) No results for input(s): PROBNP in the last 8760 hours. CBG: No results for input(s): GLUCAP in the last 168 hours. D-Dimer: No results for input(s): DDIMER in  the last 72 hours. Hgb A1c: No results for input(s): HGBA1C in the last 72 hours. Lipid Profile: No results for input(s): CHOL, HDL, LDLCALC, TRIG, CHOLHDL, LDLDIRECT in the last 72 hours. Thyroid function studies: No results for input(s): TSH, T4TOTAL, T3FREE, THYROIDAB in the last 72 hours.  Invalid input(s): FREET3 Anemia work up: No results for input(s): VITAMINB12, FOLATE, FERRITIN, TIBC, IRON, RETICCTPCT in the last 72 hours. Sepsis Labs: Recent Labs  Lab 04/25/17 0557 04/25/17 0612  WBC 9.5  --   LATICACIDVEN  --  1.99*   Microbiology Recent Results (from the past 240 hour(s))  Culture, blood (Routine x 2)     Status: None (Preliminary result)   Collection Time: 04/25/17  5:57 AM  Result Value Ref Range Status   Specimen  Description BLOOD PORTA CATH  Final   Special Requests   Final    BOTTLES DRAWN AEROBIC AND ANAEROBIC Blood Culture adequate volume   Culture   Final    NO GROWTH 1 DAY Performed at [facility], [address] [place], [place] [address]    Report Status PENDING  Incomplete  Urine C&S     Status: None   Collection Time: 04/25/17  8:37 AM  Result Value Ref Range Status   Specimen Description URINE, CLEAN CATCH  Final   Special Requests Immunocompromised  Final   Culture   Final    NO GROWTH Performed at [facility], [place] [address] [place], [place] [address]    Report Status 04/26/2017 FINAL  Final     Medications:   . calcium carbonate (dosed in mg elemental calcium)  1,000 mg of elemental calcium Oral Once  . calcium carbonate  1 tablet Oral TID WC  . chlorhexidine  15 mL Mouth Rinse BID  . enoxaparin (LOVENOX) injection  40 mg Subcutaneous Q24H  . fentaNYL  12.5 mcg Transdermal Q72H  . magnesium oxide  400 mg Oral BID  . mouth rinse  15 mL Mouth Rinse q12n4p  . potassium chloride  40 mEq Oral BID   Continuous Infusions: . sodium chloride 0.9 % 1,000 mL with potassium chloride 40 mEq infusion 100 mL/hr at 04/27/17 0421    LOS: 2 days   [facility] Pager [phone]  *Please refer to [email or web address], password [email or web address] to get updated schedule on who will round on this patient, as hospitalists switch teams weekly. If 7PM-7AM, please contact night-coverage at [email or web address], password [email or web address] for any overnight needs.  04/27/2017, 9:45 AM

## 2017-04-27 NOTE — Progress Notes (Signed)
Lindsay [name]   DOB:1966/03/02   GY#:[id number]   OJJ#:[id number]  Subjective:  Lindsay [name] was admitted with weakness, inability to void, and severe electrolyte abnormalities (likely due at least partly to her denosumab dose 12/14). She has been vigorously replaced and this AM is eating eggs and ham and enjoying it. Her pain is moderately well-controlled on dilaudid but she vomited "all day" yesterday and that has been an ongoing problem with that medication. Had soft BM yesterday. Mother and husband in room   Objective: middle aged African American woman examined in bed Vitals:   04/27/17 0409 04/27/17 0605  BP: 138/78   Pulse: (!) 120 (!) 106  Resp: 18   Temp: 98.5 F (36.9 C)   SpO2: 100%     Body mass index is 27.65 kg/m.  Intake/Output Summary (Last 24 hours) at 04/27/2017 0808 Last data filed at 04/27/2017 0607 Gross per 24 hour  Intake 2971.67 ml  Output 2000 ml  Net 971.67 ml     Sclerae unicteric  Lungs no rales or wheezes--auscultated anterolaterally  Heart regular rate and rhythm  Abdomen soft, +BS  Neuro nonfocal, well-oriented, positive affect  Breast exam: deferred  CBG (last 3)  No results for input(s): GLUCAP in the last 72 hours.   Labs:  Lab Results  Component Value Date   WBC 9.5 04/25/2017   HGB 6.4 (LL) 04/25/2017   HCT 19.3 (L) 04/25/2017   MCV 83.9 04/25/2017   PLT 316 04/25/2017   NEUTROABS 6.4 04/25/2017    '@LASTCHEMISTRY'$ @  Urine Studies No results for input(s): UHGB, CRYS in the last 72 hours.  Invalid input(s): UACOL, UAPR, USPG, UPH, UTP, UGL, UKET, UBIL, UNIT, UROB, ULEU, UEPI, UWBC, URBC, [place], [place]  Basic Metabolic Panel: Recent Labs  Lab 04/20/17 0942  04/25/17 0557 04/25/17 1752 04/26/17 1021 04/27/17 0346  NA 140  --  136 138 139 140  K 3.4*   < > 2.3* 2.7* 3.0* 4.2  CL  --   --  97* 105 105 111  CO2 24  --  '22 23 24 23  '$ GLUCOSE 79  --  104* 104* 95 72  BUN 12.3  --  '11 9 7 '$ <5*  CREATININE 0.7  --  1.06* 0.70  0.58 0.58  CALCIUM 6.2*  --  5.9* 5.3* 5.9* 5.9*  MG  --   --  1.5* 1.7  --   --    < > = values in this interval not displayed.   GFR Estimated Creatinine Clearance: 85.6 mL/min (by C-G formula based on SCr of 0.58 mg/dL). Liver Function Tests: Recent Labs  Lab 04/20/17 9371 04/25/17 0557 04/25/17 1752 04/26/17 1021  AST 135* 147* 117* 117*  ALT 101* 85* 68* 65*  ALKPHOS 43 47 42 42  BILITOT 1.29* 1.8* 1.6* 1.3*  PROT 7.1 7.1 6.1* 6.2*  ALBUMIN 3.7 3.8 3.2* 3.3*   No results for input(s): LIPASE, AMYLASE in the last 168 hours. No results for input(s): AMMONIA in the last 168 hours. Coagulation profile Recent Labs  Lab 04/25/17 0557  INR 1.24    CBC: Recent Labs  Lab 04/20/17 0942 04/25/17 0557  WBC 0.6* 9.5  NEUTROABS  --  6.4  HGB 8.5* 6.4*  HCT 26.5* 19.3*  MCV 84.4 83.9  PLT 171 316   Cardiac Enzymes: Recent Labs  Lab 04/25/17 0557  CKTOTAL 557*   BNP: Invalid input(s): POCBNP CBG: No results for input(s): GLUCAP in the last 168 hours. D-Dimer No results  for input(s): DDIMER in the last 72 hours. Hgb A1c No results for input(s): HGBA1C in the last 72 hours. Lipid Profile No results for input(s): CHOL, HDL, LDLCALC, TRIG, CHOLHDL, LDLDIRECT in the last 72 hours. Thyroid function studies No results for input(s): TSH, T4TOTAL, T3FREE, THYROIDAB in the last 72 hours.  Invalid input(s): FREET3 Anemia work up No results for input(s): VITAMINB12, FOLATE, FERRITIN, TIBC, IRON, RETICCTPCT in the last 72 hours. Microbiology Recent Results (from the past 240 hour(s))  Culture, blood (Routine x 2)     Status: None (Preliminary result)   Collection Time: 04/25/17  5:57 AM  Result Value Ref Range Status   Specimen Description BLOOD PORTA CATH  Final   Special Requests   Final    BOTTLES DRAWN AEROBIC AND ANAEROBIC Blood Culture adequate volume   Culture   Final    NO GROWTH 1 DAY Performed at [facility], [address], [place], [place]  [address]    Report Status PENDING  Incomplete  Urine C&S     Status: None   Collection Time: 04/25/17  8:37 AM  Result Value Ref Range Status   Specimen Description URINE, CLEAN CATCH  Final   Special Requests Immunocompromised  Final   Culture   Final    NO GROWTH Performed at [facility], [place] [address] [place],  [address]    Report Status 04/26/2017 FINAL  Final      Studies:  Ct Abdomen Pelvis W Contrast  Result Date: 04/25/2017 CLINICAL DATA:  Urinary retention.History of metastatic disease. EXAM: CT ABDOMEN AND PELVIS WITH CONTRAST TECHNIQUE: Multidetector CT imaging of the abdomen and pelvis was performed using the standard protocol following bolus administration of intravenous contrast. CONTRAST:  157m ISOVUE-300 IOPAMIDOL (ISOVUE-300) INJECTION 61% COMPARISON:  Abdominal MRI 03/01/2017.  CT abdomen 08/14/2015. FINDINGS: Lower chest: Heart unremarkable. Innumerable bilateral pulmonary nodules identified. Dominant nodule in the medial left lung base measures 18 x 12 mm on image 17 of series 7. Hepatobiliary: Multiple liver metastases are evident. 4.4 cm lesion in the medial segment left liver appears to be the same lesion that was about 14 mm on the previous MRI. Many of the hepatic lesions appear new in the interval and most of the lesions range in size from 5 mm up to about 14 mm. Gallbladder surgically absent. No intrahepatic or extrahepatic biliary dilation. Pancreas: No focal mass lesion. No dilatation of the main duct. No intraparenchymal cyst. No peripancreatic edema. Spleen: No splenomegaly. No focal mass lesion. Adrenals/Urinary Tract: No adrenal nodule or mass. Kidneys unremarkable. No evidence for hydroureter. Foley catheter decompresses the urinary bladder. Stomach/Bowel: Stomach is nondistended. No gastric wall thickening. No evidence of outlet obstruction. Duodenum is normally positioned as is the ligament of Treitz. No small bowel wall thickening. No small  bowel dilatation. The terminal ileum is normal. The appendix is normal. No gross colonic mass. No colonic wall thickening. No substantial diverticular change. Vascular/Lymphatic: No abdominal aortic aneurysm. No abdominal aortic atherosclerotic calcification. There is no gastrohepatic or hepatoduodenal ligament lymphadenopathy. No intraperitoneal or retroperitoneal lymphadenopathy. 11 mm short axis necrotic left common iliac lymph nodes seen image 38 series 3. 19 mm necrotic left iliac lymph node visible on image 46. Reproductive: Uterus surgically absent.  There is no adnexal mass. Other: No intraperitoneal free fluid. Musculoskeletal: Lytic bone lesions are seen scattered through the thoracolumbar spine and bony pelvis. IMPRESSION: 1. Metastatic disease in the liver has progressed since MRI of 03/01/2017. 2. Necrotic left  iliac lymphadenopathy compatible with metastatic disease. 3. Innumerable pulmonary nodules consistent with metastatic involvement. 4. Diffuse lytic bone metastases in the thoracolumbar spine and bony pelvis. 5. Foley catheter decompresses the urinary bladder. Electronically Signed   By: Misty Stanley M.D.   On: 04/25/2017 11:20    Assessment: 51 y.o. [place] woman status post right breast upper outer quadrant biopsy 12/14/2015 for a clinical T1c N0, stage IA  invasive ductal carcinoma, grade 3, triple negative, with an MIB-1 of 70%             (a) this stages as IIB In the 2018 new Prognostic classification  (1) genetics testing negative for mutations within any of 20 genes on the Breast/Ovarian Cancer Panel through [employer]. Additionally, no VUS were found.   (2) neoadjuvant chemotherapy consisting of doxorubicin and cyclophosphamide in dose this fashion 4, completed 02/22/2016, followed by paclitaxel weekly 5 (of 12 treatments planned), starting 03/08/2016, stopped 04/05/2016 after cycle 5 because of neuropathy             (a) cycle 4 of cyclophosphamide and  doxorubicin held 1 week because of side effects  (3) status post right lumpectomy and sentinel lymph node sampling 05/05/2016 for a residual ypT2 ypN) invasive ductal carcinoma, grade 3, the repeat prognostic panel again triple negative the residual cancer burden was 2  (4) adjuvant radiation 06/20/16 - 08/04/16 : 50.4 Gy to the right breast plus a 10 Gy boost  (5) left thyroid nodule biopsy 04/14/2016 read as [facility] 1  METASTATIC DISEASE: October 2018 (6) MRI of the abdomen March 01, 2017 shows multiple lesions in the lungs, liver, and bones             (a) the tumor is PD-L1 negative--not a candidate for pembrolizumab   (b) CA 27-29 not informative  (7) started capecitabine March 14, 2017;              (a) discontinued 03/19/2017 with multiple side effects   (8) started denosumab/Xgeva March 10, 2017, most recent dose 26/83/4196  (a) complicated by severe hypocalcemia  (9) pain syndrome: .control complicated by nausea/vomiting on hydromorphone, urinary retention on hydromorphone/fentanyl, generally better pain control on fentanyl  (10) started CMF chemotherapy 04/06/2017, to be repeated every 21 days  (11) severe anemia--workup ending  (12) electrolyte abnormalities-- being corrected.         Plan:  Lindsay [name] looks and feels much better and her electrolytes/QTc are improved. The low calcium in particular is likely due to her denosumab dose 12/14. Remaining issues are anemia, pain, urinary retention.  The urinary retention is likely due to narcotics. As noted above, she vomits the dilaudid so we were not able to control her pain as outpatient on that medication. She did much better on fentanyl. Perhaps if we do fentanyl WITHOUT dilaudid she may be able to urinate. Will resume fentanyl (at a lower dose), d/c the dilaudid, and use toradol temporarily PRN-- that can be switched to naproxen at discharge  Would consider removing foley today and having PT evaluate. If she is  ambulatory, can urinate and her pain is well-controlled she could be d/c'd next 24-48h  Will workup the anemia (labs today). Will follow closely with you  She was due for chemo today. Will reschedule for next week (after Christmas)  Greatly appreciate your help to this patient and her family!   Lindsay Lime, MD 04/27/2017  8:08 AM Medical Oncology and Hematology [facility] [place],  [place] [address] Tel. [phone]    Fax. [phone]

## 2017-04-27 NOTE — Progress Notes (Signed)
Providing support at bedside while pt experiencing pain crisis.  Spouse, Lindsay [name] at bedside.    Lindsay [name] expresses frustration around not being able to provide comfort / pain relief for Lindsay [name].  Family speaks with chaplain about their christian faith and voices reliance on God in midst of uncertainty.  Lindsay [name] states he "has to question" why the couple has walked through the extent of tragedy they have.  Chaplain provided space to explore faith narrative, empathic pastoral presence as Lindsay [name] was experiencing profound pain this afternoon.   Lindsay [name] brightened with the presence of two co-workers.    Chaplain will follow up for continued support on unit and forward to cancer center chaplain for follow up in outpatient context.    [facility] / [place], MDiv

## 2017-04-27 NOTE — Progress Notes (Signed)
CRITICAL VALUE ALERT  Critical Value:  Calcium  Date & Time Notied:  5:34 AM 04/27/17    Provider Notified: [facility]  Orders Received/Actions taken: Awaiting orders

## 2017-04-28 ENCOUNTER — Inpatient Hospital Stay (HOSPITAL_COMMUNITY): Payer: BLUE CROSS/BLUE SHIELD

## 2017-04-28 DIAGNOSIS — Z515 Encounter for palliative care: Secondary | ICD-10-CM

## 2017-04-28 DIAGNOSIS — G893 Neoplasm related pain (acute) (chronic): Secondary | ICD-10-CM

## 2017-04-28 DIAGNOSIS — M5442 Lumbago with sciatica, left side: Secondary | ICD-10-CM

## 2017-04-28 LAB — CBC WITH DIFFERENTIAL/PLATELET
BAND NEUTROPHILS: 12 %
BASOS PCT: 0 %
Basophils Absolute: 0 10*3/uL (ref 0.0–0.1)
Blasts: 0 %
EOS ABS: 0 10*3/uL (ref 0.0–0.7)
EOS PCT: 0 %
HCT: 24.5 % — ABNORMAL LOW (ref 36.0–46.0)
Hemoglobin: 7.9 g/dL — ABNORMAL LOW (ref 12.0–15.0)
LYMPHS ABS: 0.4 10*3/uL — AB (ref 0.7–4.0)
LYMPHS PCT: 6 %
MCH: 28.1 pg (ref 26.0–34.0)
MCHC: 32.2 g/dL (ref 30.0–36.0)
MCV: 87.2 fL (ref 78.0–100.0)
MONO ABS: 0.1 10*3/uL (ref 0.1–1.0)
MONOS PCT: 2 %
Metamyelocytes Relative: 10 %
Myelocytes: 2 %
NEUTROS ABS: 5.9 10*3/uL (ref 1.7–7.7)
Neutrophils Relative %: 68 %
OTHER: 0 %
PLATELETS: 118 10*3/uL — AB (ref 150–400)
Promyelocytes Absolute: 0 %
RBC: 2.81 MIL/uL — ABNORMAL LOW (ref 3.87–5.11)
RDW: 17.4 % — ABNORMAL HIGH (ref 11.5–15.5)
WBC: 6.4 10*3/uL (ref 4.0–10.5)
nRBC: 4 /100 WBC — ABNORMAL HIGH

## 2017-04-28 LAB — BASIC METABOLIC PANEL
ANION GAP: 4 — AB (ref 5–15)
BUN: 8 mg/dL (ref 6–20)
CALCIUM: 6.9 mg/dL — AB (ref 8.9–10.3)
CO2: 23 mmol/L (ref 22–32)
Chloride: 114 mmol/L — ABNORMAL HIGH (ref 101–111)
Creatinine, Ser: 0.49 mg/dL (ref 0.44–1.00)
GFR calc Af Amer: >60 mL/min (ref >60)
GFR calc non Af Amer: >60 mL/min (ref >60)
GLUCOSE: 83 mg/dL (ref 65–99)
POTASSIUM: 4.7 mmol/L (ref 3.5–5.1)
Sodium: 141 mmol/L (ref 135–145)

## 2017-04-28 MED ORDER — HYDROMORPHONE HCL 2 MG PO TABS
4.0000 mg | ORAL_TABLET | ORAL | Status: DC | PRN
  Administered 2017-04-28 – 2017-05-03 (×32): 4 mg via ORAL
  Filled 2017-04-28 (×32): qty 2

## 2017-04-28 MED ORDER — FENTANYL 25 MCG/HR TD PT72
25.0000 ug | MEDICATED_PATCH | TRANSDERMAL | Status: DC
  Administered 2017-04-28 – 2017-05-01 (×2): 25 ug via TRANSDERMAL
  Filled 2017-04-28 (×2): qty 1

## 2017-04-28 MED ORDER — ALPRAZOLAM 0.5 MG PO TABS
0.5000 mg | ORAL_TABLET | Freq: Once | ORAL | Status: AC
  Administered 2017-04-28: 0.5 mg via ORAL
  Filled 2017-04-28: qty 1

## 2017-04-28 MED ORDER — SODIUM CHLORIDE 0.9 % IV SOLN
1.0000 g | Freq: Once | INTRAVENOUS | Status: AC
  Administered 2017-04-28: 1 g via INTRAVENOUS
  Filled 2017-04-28: qty 10

## 2017-04-28 MED ORDER — FENTANYL CITRATE (PF) 100 MCG/2ML IJ SOLN
25.0000 ug | INTRAMUSCULAR | Status: DC | PRN
  Administered 2017-04-29 – 2017-04-30 (×3): 25 ug via INTRAVENOUS
  Filled 2017-04-28 (×4): qty 2

## 2017-04-28 MED ORDER — CALCIUM CARBONATE 1250 (500 CA) MG PO TABS
1250.0000 mg | ORAL_TABLET | Freq: Three times a day (TID) | ORAL | Status: AC
  Administered 2017-04-28 – 2017-04-29 (×3): 1250 mg via ORAL
  Filled 2017-04-28 (×3): qty 1

## 2017-04-28 MED ORDER — PROCHLORPERAZINE EDISYLATE 5 MG/ML IJ SOLN
10.0000 mg | Freq: Three times a day (TID) | INTRAMUSCULAR | Status: DC
  Administered 2017-04-28 – 2017-05-03 (×15): 10 mg via INTRAVENOUS
  Filled 2017-04-28 (×15): qty 2

## 2017-04-28 NOTE — Care Management Note (Signed)
Case Management Note  Patient Details  Name: Lindsay [name] MRN: [id number] Date of Birth: 1966/05/03  Subjective/Objective:                    Action/Plan: [facility] will follow pt for discharge home with [facility]   Expected Discharge Date:  (unknown)               Expected Discharge Plan:  [place]  In-House Referral:     Discharge planning Services  CM Consult  Post Acute Care Choice:  Home Health Choice offered to:  Patient  DME Arranged:    DME Agency:     HH Arranged:  PT, OT HH Agency:  [facility] (now [facility])  Status of Service:  In process, will continue to follow  If discussed at Long Length of Stay Meetings, dates discussed:    Additional CommentsPurcell Mouton, RN 04/28/2017, 3:00 PM

## 2017-04-28 NOTE — Progress Notes (Signed)
Pt resting without distress, spouse at bedside, pt continue to have good pain control stating pain level at level 5-6/10. Pt drowsy and easily aroused. Will cont to monitor. SRP, RN

## 2017-04-28 NOTE — Progress Notes (Signed)
PCA Fentanyl D/C'd as ordered Fentanyl patch around 10 am, pt pain level currently 6/10.  Witness by Clementeen Hoof, RN. Will cont to monitor. SRP, RN

## 2017-04-28 NOTE — Progress Notes (Signed)
On call [facility] notified pt  c/o 10/10 uncontrolled pain to her left hip that she describes as "sharp". Pt and husband are very upset pt has been in pain "all day long with no relief". Pt also states "no ones listening to me". Blount ordered Norco and phenergan to be given. See MAR. After writer gave medication in addition to ativan, xanax, and toradol, pt still physically upset and crying that her pain is still 10/10. Blount notified again of situation and ordered fentanyl patch to be removed and basal rate on fentanyl PCA to be initiated. Writer has tried other measures to insure comfort to pt such as; ice, heat, cushion to sit on, repositioning in bed, and emotional support. Will continue to monitor pt closely.

## 2017-04-28 NOTE — Progress Notes (Signed)
Lindsay [name]   DOB:Mar 03, 1966   FB#:[id number]   YJW#:[id number]  Subjective:  [name] had an episode of uncontrolled pain last night, centering on her left hip. Her fentanyl patches were d/c'd, she was started on a fentanyl drip and received other interventions. She is more comfortable this AM. She has not ambulated since admission. She has a foley in place which she tolerates well. She had a soft BM yesterday. No family in room   Objective: middle aged African American woman exmained in bed Vitals:   04/28/17 0300 04/28/17 0408  BP:  (!) 141/78  Pulse:  93  Resp: 15 16  Temp:  98.4 F (36.9 C)  SpO2: 99% 98%    Body mass index is 27.65 kg/m.  Intake/Output Summary (Last 24 hours) at 04/28/2017 0812 Last data filed at 04/28/2017 0600 Gross per 24 hour  Intake 2066.66 ml  Output 600 ml  Net 1466.66 ml    Sclerae unicteric, EOMs intact Lungs no rales or rhonchi Heart regular rate and rhythm Abd soft, nontender, positive bowel sounds Neuro: nonfocal, well oriented, appropriate affect Breasts: Deferred   CBG (last 3)  No results for input(s): GLUCAP in the last 72 hours.   Labs:  Lab Results  Component Value Date   WBC 6.4 04/28/2017   HGB 7.9 (L) 04/28/2017   HCT 24.5 (L) 04/28/2017   MCV 87.2 04/28/2017   PLT 118 (L) 04/28/2017   NEUTROABS 5.9 04/28/2017    _0 @  Urine Studies No results for input(s): UHGB, CRYS in the last 72 hours.  Invalid input(s): UACOL, UAPR, USPG, UPH, UTP, UGL, UKET, UBIL, UNIT, UROB, [place], UEPI, UWBC, Lindsay [name], [place]  Basic Metabolic Panel: Recent Labs  Lab 04/25/17 0557 04/25/17 1752 04/26/17 1021 04/27/17 0346 04/28/17 0342  NA 136 138 139 140 141  K 2.3* 2.7* 3.0* 4.2 4.7  CL 97* 105 105 111 114*  CO2 _1 GLUCOSE 104* 104* 95 72 83  BUN _2 <5* 8  CREATININE 1.06* 0.70 0.58 0.58 0.49  CALCIUM 5.9* 5.3* 5.9* 5.9* 6.9*  MG 1.5* 1.7  --  2.0  --    GFR Estimated Creatinine  Clearance: 85.6 mL/min (by C-G formula based on SCr of 0.49 mg/dL). Liver Function Tests: Recent Labs  Lab 04/25/17 0557 04/25/17 1752 04/26/17 1021  AST 147* 117* 117*  ALT 85* 68* 65*  ALKPHOS 47 42 42  BILITOT 1.8* 1.6* 1.3*  PROT 7.1 6.1* 6.2*  ALBUMIN 3.8 3.2* 3.3*   No results for input(s): LIPASE, AMYLASE in the last 168 hours. No results for input(s): AMMONIA in the last 168 hours. Coagulation profile Recent Labs  Lab 04/25/17 0557  INR 1.24    CBC: Recent Labs  Lab 04/25/17 0557 04/27/17 0930 04/28/17 0342  WBC 9.5 7.7 6.4  NEUTROABS 6.4 5.7 5.9  HGB 6.4* 8.4* 7.9*  HCT 19.3* 25.3* 24.5*  MCV 83.9 86.1 87.2  PLT 316 149* 118*   Cardiac Enzymes: Recent Labs  Lab 04/25/17 0557  CKTOTAL 557*   BNP: Invalid input(s): POCBNP CBG: No results for input(s): GLUCAP in the last 168 hours. D-Dimer No results for input(s): DDIMER in the last 72 hours. Hgb A1c No results for input(s): HGBA1C in the last 72 hours. Lipid Profile No results for input(s): CHOL, HDL, LDLCALC, TRIG, CHOLHDL, LDLDIRECT in the last 72 hours. Thyroid function studies No results for input(s): TSH, T4TOTAL, T3FREE, THYROIDAB in the last 72 hours.  Invalid  input(s): FREET3 Anemia work up Recent Labs    04/27/17 0930  VITAMINB12 1,262*  FOLATE 11.1  FERRITIN 1,982*  RETICCTPCT 2.7   Microbiology Recent Results (from the past 240 hour(s))  Culture, blood (Routine x 2)     Status: None (Preliminary result)   Collection Time: 04/25/17  5:57 AM  Result Value Ref Range Status   Specimen Description BLOOD PORTA CATH  Final   Special Requests   Final    BOTTLES DRAWN AEROBIC AND ANAEROBIC Blood Culture adequate volume   Culture   Final    NO GROWTH 2 DAYS Performed at [facility], [address], [place], [place] [address]    Report Status PENDING  Incomplete  Urine C&S     Status: None   Collection Time: 04/25/17  8:37 AM  Result Value Ref Range Status   Specimen  Description URINE, CLEAN CATCH  Final   Special Requests Immunocompromised  Final   Culture   Final    NO GROWTH Performed at [facility], [place] [address], [place], [place] [address]    Report Status 04/26/2017 FINAL  Final      Studies:  Dg Hip Unilat With Pelvis 2-3 Views Left  Result Date: 04/27/2017 CLINICAL DATA:  Acute onset of severe left hip pain which began this morning. No known injury. Current history of stage IV breast cancer for which the patient receiving chemotherapy. EXAM: DG HIP (WITH OR WITHOUT PELVIS) 2-3V LEFT COMPARISON:  None. FINDINGS: No evidence of acute fracture or dislocation. Well preserved joint space. Well preserved bone mineral density. No intrinsic osseous abnormality. Included AP pelvis demonstrates a normal-appearing contralateral right hip. Sacroiliac joints and symphysis pubis intact. Visualized lower lumbar spine unremarkable. Opaque material is present external to the patient overlying the inner thigh. IMPRESSION: Normal examination. Electronically Signed   By: Lindsay [name] M.D.   On: 04/27/2017 20:00    Assessment: 51 y.o. Lindsay [name] woman status post right breast upper outer quadrant biopsy 12/14/2015 for a clinical T1c N0, stage IA  invasive ductal carcinoma, grade 3, triple negative, with an MIB-1 of 70%             (a) this stages as IIB In the 2018 new Prognostic classification  (1) genetics testing negative for mutations within any of 20 genes on the Breast/Ovarian Cancer Panel through [employer]. Additionally, no VUS were found.   (2) neoadjuvant chemotherapy consisting of doxorubicin and cyclophosphamide in dose this fashion 4, completed 02/22/2016, followed by paclitaxel weekly 5 (of 12 treatments planned), starting 03/08/2016, stopped 04/05/2016 after cycle 5 because of neuropathy             (a) cycle 4 of cyclophosphamide and doxorubicin held 1 week because of side effects  (3) status post right lumpectomy and  sentinel lymph node sampling 05/05/2016 for a residual ypT2 ypN) invasive ductal carcinoma, grade 3, the repeat prognostic panel again triple negative the residual cancer burden was 2  (4) adjuvant radiation 06/20/16 - 08/04/16 : 50.4 Gy to the right breast plus a 10 Gy boost  (5) left thyroid nodule biopsy 04/14/2016 read as [facility] 1  METASTATIC DISEASE: October 2018 (6) MRI of the abdomen March 01, 2017 shows multiple lesions in the lungs, liver, and bones             (a) the tumor is PD-L1 negative--not a candidate for pembrolizumab   (b) CA 27-29 not informative  (7) started capecitabine March 14, 2017;              (  a) discontinued 03/19/2017 with multiple side effects   (8) started denosumab/Xgeva March 10, 2017, most recent dose 27/51/7001  (a) complicated by severe hypocalcemia  (9) pain syndrome: .control complicated by nausea/vomiting on hydromorphone, urinary retention on hydromorphone/fentanyl, generally better pain control on fentanyl  (10) started CMF chemotherapy 04/06/2017, to be repeated every 21 days  (11) severe anemia--workup ending  (12) electrolyte abnormalities-- being corrected.         REVIEW OF BLOOD FILM: Don't see schistocytes; no nucleated RBC's or immature WBCs-- no evidence of leukoerythroblastic picture; no platelets clumps; some teardrops but no significant RBC anisocytosis  Plan:  Pami's pain is till not well controlled. In general, she does well with fentanyl at 25 mcg/h, with dilaudid breakthrough--however she gets nausea from the dilaudid and urinary retention from the combination.  I discussed going home with a foley or doing in-out caths and she is not for either idea!  At this point would suggest going back to fentanyl 25 mch/h, dilaudid 4 mg PRN with compazine RTC, and consider palliative care team consult to help [place] manage the pain.   I will ask radiation oncology to consider palliative XRT to the left hip area. Dr Aileen Fass will obtain MRI of lumbar spine to guide therapy as appropriate.  Once the pain is controlled the question is can she urinate. If not she may need to keep her foley at discharge.  I will be out of town next week but will ask one of my partners to look in on [place], MD 04/28/2017  8:12 AM Medical Oncology and Hematology [facility] [place], [place] [address] Tel. [phone]    Fax. [phone]

## 2017-04-28 NOTE — Progress Notes (Signed)
Received called from [facility] Dr. Olevia Bowens,  inquiring about pain meds and PCA Fentanyl discontinuation. Pt PCA d/c from DR. Marginat order this am 2hrs after Fentanly topical placed on patient. Pt given pain meds to bridge over PCA at time of discontinuation. Pt given med noted on MAR, pt currently sitting in chair, drowsy, responsive to name call and states pain level is controlled ( did not give a numeric level) at current time. "Hurt  when I move". Mother at bedside with patient and has stayed with patient all day. Pt checked on several times and pain assessed.  Will cont to monitor. SRP, RN

## 2017-04-28 NOTE — Progress Notes (Signed)
Palliative Medicine consult noted. Due to high referral volume, there may be a delay seeing this patient. Please call the Palliative Medicine Team office at [phone] if recommendations are needed in the interim.  Thank you for inviting us to see this patient.  Brianna Esson G. Paz Winsett, RN, BSN, CHPN 04/28/2017 4:56 PM Office [phone] 

## 2017-04-28 NOTE — Progress Notes (Signed)
   04/28/17 1600  Clinical Encounter Type  Visited With Patient and family together  Visit Type Follow-up  Spiritual Encounters  Spiritual Needs Emotional;Prayer  Stress Factors  Patient Stress Factors Health changes (was hoping to go home today)   Following up from a visit Wednesday when patient was not feeling well. Patient up in a chair and mom was present.  Was disappointed that she was not going home today.  Patient was quiet.  She accepted my offer to pray and we did.  Will follow as needed. Chaplain Katherene Ponto

## 2017-04-28 NOTE — Progress Notes (Signed)
[facility] PROGRESS NOTE    Progress Note  Lindsay [name]  SKA:[id number] DOB: [birth year] DOA: 04/25/2017 PCP: Bernerd Limbo, MD     Brief Narrative:   Lindsay [name] is an 51 y.o. female past medical history significant for triple negative stage IV breast, depression presents to the ED with inability to urinate started 4 days prior to admission, accompanied by lower abdominal discomfort, concentrated urine nonbloody.  Denies any nausea vomiting or abdominal pain  Assessment/Plan:   Acute  Urinary retention: CT scan of the abdomen and pelvis does not show any obstruction. Likely due to medications diphenhydramine, and probably narcotics, in the setting of electrolytes imbalance. Appreciate oncology's assistance.   She will need a foley  Prolonged QT: Likely due to electrolyte derangements.  QTC continues to improve.  Continue replete Potassium and magnesium and calcium IV and oral. Discontinue Compazine, Zofran, Phenergan, and resume Effexor.  Hypocalcemia: Hypocalcemia cont to improve, continue to replete IV and oral. Likely due to to denosumab  Hypokalemia: Repleted IV and orally now resolved.  Stage IV triple negative breast cancer: Follow-up with Dr. Jana [name] as an outpatient.  Hypomagnasemia: Resolved.  Elevated LFTs: Improving likely due to nausea and vomiting. Abd US showed no biliary obst. or dilaton.  Chemotherapy-induced anemia: Status post 1 unit of packed red blood cells no evidence of bleeding.  Left hip pain: X-ray of the left hip showed no fracture dislocation of metastatic disease. We will check an MRI of her lumbar spine.   DVT prophylaxis: lovenox Family Communication:none Disposition Plan/Barrier to D/C: Unable to determine. Code Status:     Code Status Orders  (From admission, onward)        Start     Ordered   04/25/17 1205  Full code  Continuous     04/25/17 1204    Code Status History    Date Active Date Inactive Code  Status Order ID Comments User Context   This patient has a current code status but no historical code status.        IV Access:    Peripheral IV   Procedures and diagnostic studies:   Dg Hip Unilat With Pelvis 2-3 Views Left  Result Date: 04/27/2017 CLINICAL DATA:  Acute onset of severe left hip pain which began this morning. No known injury. Current history of stage IV breast cancer for which the patient receiving chemotherapy. EXAM: DG HIP (WITH OR WITHOUT PELVIS) 2-3V LEFT COMPARISON:  None. FINDINGS: No evidence of acute fracture or dislocation. Well preserved joint space. Well preserved bone mineral density. No intrinsic osseous abnormality. Included AP pelvis demonstrates a normal-appearing contralateral right hip. Sacroiliac joints and symphysis pubis intact. Visualized lower lumbar spine unremarkable. Opaque material is present external to the patient overlying the inner thigh. IMPRESSION: Normal examination. Electronically Signed   By: Evangeline Dakin M.D.   On: 04/27/2017 20:00     Medical Consultants:    None.  Anti-Infectives:   None  Subjective:    Lindsay [name] patient was much better than yesterday no nausea.  Objective:    Vitals:   04/27/17 2159 04/27/17 2305 04/28/17 0300 04/28/17 0408  BP: 125/72   (!) 141/78  Pulse: 85   93  Resp: 19 17 15 16   Temp: 98.2 F (36.8 C)   98.4 F (36.9 C)  TempSrc: Oral   Oral  SpO2: 97% 97% 99% 98%  Weight:      Height:        Intake/Output Summary (Last  24 hours) at 04/28/2017 0820 Last data filed at 04/28/2017 0600 Gross per 24 hour  Intake 2066.66 ml  Output 600 ml  Net 1466.66 ml   Filed Weights   04/25/17 0527 04/25/17 1705  Weight: 77.1 kg (170 lb) 77.7 kg (171 lb 4.8 oz)    Exam: General exam: In no acute distress. Respiratory system: Good air movement and clear to auscultation. Cardiovascular system: S1 & S2 heard, RRR.  Gastrointestinal system: Abdomen is nondistended, soft and nontender.   Central nervous system: Alert and oriented. No focal neurological deficits. Extremities: No pedal edema. Skin: No rashes, lesions or ulcers Psychiatry: Judgement and insight appear normal. Mood & affect appropriate.    Data Reviewed:    Labs: Basic Metabolic Panel: Recent Labs  Lab 04/25/17 0557 04/25/17 1752 04/26/17 1021 04/27/17 0346 04/28/17 0342  NA 136 138 139 140 141  K 2.3* 2.7* 3.0* 4.2 4.7  CL 97* 105 105 111 114*  CO2 22 23 24 23 23   GLUCOSE 104* 104* 95 72 83  BUN 11 9 7  <5* 8  CREATININE 1.06* 0.70 0.58 0.58 0.49  CALCIUM 5.9* 5.3* 5.9* 5.9* 6.9*  MG 1.5* 1.7  --  2.0  --    GFR Estimated Creatinine Clearance: 85.6 mL/min (by C-G formula based on SCr of 0.49 mg/dL). Liver Function Tests: Recent Labs  Lab 04/25/17 0557 04/25/17 1752 04/26/17 1021  AST 147* 117* 117*  ALT 85* 68* 65*  ALKPHOS 47 42 42  BILITOT 1.8* 1.6* 1.3*  PROT 7.1 6.1* 6.2*  ALBUMIN 3.8 3.2* 3.3*   No results for input(s): LIPASE, AMYLASE in the last 168 hours. No results for input(s): AMMONIA in the last 168 hours. Coagulation profile Recent Labs  Lab 04/25/17 0557  INR 1.24    CBC: Recent Labs  Lab 04/25/17 0557 04/27/17 0930 04/28/17 0342  WBC 9.5 7.7 6.4  NEUTROABS 6.4 5.7 5.9  HGB 6.4* 8.4* 7.9*  HCT 19.3* 25.3* 24.5*  MCV 83.9 86.1 87.2  PLT 316 149* 118*   Cardiac Enzymes: Recent Labs  Lab 04/25/17 0557  CKTOTAL 557*   BNP (last 3 results) No results for input(s): PROBNP in the last 8760 hours. CBG: No results for input(s): GLUCAP in the last 168 hours. D-Dimer: No results for input(s): DDIMER in the last 72 hours. Hgb A1c: No results for input(s): HGBA1C in the last 72 hours. Lipid Profile: No results for input(s): CHOL, HDL, LDLCALC, TRIG, CHOLHDL, LDLDIRECT in the last 72 hours. Thyroid function studies: No results for input(s): TSH, T4TOTAL, T3FREE, THYROIDAB in the last 72 hours.  Invalid input(s): FREET3 Anemia work up: Recent Labs     04/27/17 0930  VITAMINB12 1,262*  FOLATE 11.1  FERRITIN 1,982*  RETICCTPCT 2.7   Sepsis Labs: Recent Labs  Lab 04/25/17 0557 04/25/17 0612 04/27/17 0930 04/28/17 0342  WBC 9.5  --  7.7 6.4  LATICACIDVEN  --  1.99*  --   --    Microbiology Recent Results (from the past 240 hour(s))  Culture, blood (Routine x 2)     Status: None (Preliminary result)   Collection Time: 04/25/17  5:57 AM  Result Value Ref Range Status   Specimen Description BLOOD PORTA CATH  Final   Special Requests   Final    BOTTLES DRAWN AEROBIC AND ANAEROBIC Blood Culture adequate volume   Culture   Final    NO GROWTH 2 DAYS Performed at [facility], [address], [place], [place] [address]  Report Status PENDING  Incomplete  Urine C&S     Status: None   Collection Time: 04/25/17  8:37 AM  Result Value Ref Range Status   Specimen Description URINE, CLEAN CATCH  Final   Special Requests Immunocompromised  Final   Culture   Final    NO GROWTH Performed at [facility], [address], [place], [place] [address]    Report Status 04/26/2017 FINAL  Final     Medications:   . calcium carbonate (dosed in mg elemental calcium)  1,000 mg of elemental calcium Oral Once  . calcium carbonate  1 tablet Oral TID WC  . chlorhexidine  15 mL Mouth Rinse BID  . enoxaparin (LOVENOX) injection  40 mg Subcutaneous Q24H  . fentaNYL   Intravenous Q4H  . mouth rinse  15 mL Mouth Rinse q12n4p   Continuous Infusions: . 0.9 % NaCl with KCl 40 mEq / L 100 mL/hr (04/28/17 0656)    LOS: 3 days   [facility] Pager [phone]  *Please refer to [email or web address], password [email or web address] to get updated schedule on who will round on this patient, as hospitalists switch teams weekly. If 7PM-7AM, please contact night-coverage at [email or web address], password [email or web address] for any overnight needs.  04/28/2017, 8:20 AM

## 2017-04-29 DIAGNOSIS — E611 Iron deficiency: Secondary | ICD-10-CM

## 2017-04-29 DIAGNOSIS — Z171 Estrogen receptor negative status [ER-]: Secondary | ICD-10-CM

## 2017-04-29 DIAGNOSIS — E538 Deficiency of other specified B group vitamins: Secondary | ICD-10-CM

## 2017-04-29 DIAGNOSIS — C50411 Malignant neoplasm of upper-outer quadrant of right female breast: Secondary | ICD-10-CM

## 2017-04-29 LAB — CBC WITH DIFFERENTIAL/PLATELET
BAND NEUTROPHILS: 8 %
BASOS ABS: 0.1 10*3/uL (ref 0.0–0.1)
BASOS PCT: 1 %
EOS ABS: 0 10*3/uL (ref 0.0–0.7)
Eosinophils Relative: 0 %
HCT: 17.3 % — ABNORMAL LOW (ref 36.0–46.0)
HEMOGLOBIN: 5.7 g/dL — AB (ref 12.0–15.0)
LYMPHS ABS: 1.2 10*3/uL (ref 0.7–4.0)
LYMPHS PCT: 16 %
MCH: 28.8 pg (ref 26.0–34.0)
MCHC: 32.9 g/dL (ref 30.0–36.0)
MCV: 87.4 fL (ref 78.0–100.0)
MONO ABS: 0.3 10*3/uL (ref 0.1–1.0)
MONOS PCT: 4 %
Metamyelocytes Relative: 11 %
Myelocytes: 1 %
NEUTROS ABS: 5.8 10*3/uL (ref 1.7–7.7)
NEUTROS PCT: 59 %
PLATELETS: 134 10*3/uL — AB (ref 150–400)
RBC: 1.98 MIL/uL — ABNORMAL LOW (ref 3.87–5.11)
RDW: 17.7 % — AB (ref 11.5–15.5)
WBC: 7.4 10*3/uL (ref 4.0–10.5)

## 2017-04-29 LAB — BASIC METABOLIC PANEL
ANION GAP: 6 (ref 5–15)
ANION GAP: 7 (ref 5–15)
BUN: 7 mg/dL (ref 6–20)
BUN: 6 mg/dL (ref 6–20)
CALCIUM: 7.5 mg/dL — AB (ref 8.9–10.3)
CO2: 21 mmol/L — AB (ref 22–32)
CO2: 21 mmol/L — AB (ref 22–32)
CREATININE: 0.46 mg/dL (ref 0.44–1.00)
Calcium: 7.5 mg/dL — ABNORMAL LOW (ref 8.9–10.3)
Chloride: 114 mmol/L — ABNORMAL HIGH (ref 101–111)
Chloride: 110 mmol/L (ref 101–111)
Creatinine, Ser: 0.5 mg/dL (ref 0.44–1.00)
GFR calc Af Amer: >60 mL/min (ref >60)
GLUCOSE: 72 mg/dL (ref 65–99)
GLUCOSE: 76 mg/dL (ref 65–99)
POTASSIUM: 5.3 mmol/L — AB (ref 3.5–5.1)
Potassium: 5.1 mmol/L (ref 3.5–5.1)
Sodium: 141 mmol/L (ref 135–145)
Sodium: 138 mmol/L (ref 135–145)

## 2017-04-29 LAB — HEMOGLOBIN AND HEMATOCRIT, BLOOD
HEMATOCRIT: 24.5 % — AB (ref 36.0–46.0)
HEMOGLOBIN: 8 g/dL — AB (ref 12.0–15.0)

## 2017-04-29 LAB — HAPTOGLOBIN: HAPTOGLOBIN: 159 mg/dL (ref 34–200)

## 2017-04-29 NOTE — Progress Notes (Addendum)
CRITICAL VALUE ALERT  Critical Value:  Hgb 5.7   Date & Time Notied:  04/29/17 0530  Provider Notified: Kennon Holter 0535  Orders Received/Actions taken: MD ordered repeat Hgb

## 2017-04-29 NOTE — Progress Notes (Signed)
ARKIE TAGLIAFERRO   DOB:06-11-1965   EX#:[id number]   WNU#:[id number]  Subjective:  Bindi tells me her pain is better controlled; she is currently on fentanyl 25 mch/h, PRN dilaudid and if that does not work IV fentanyl. She has not required IV fentanyl today. Her foley is out and she tells me she was able to urinate this AM. She had a BM yesterday; no family in room  Objective: 51-year-old African American woman examined in bed Vitals:   04/28/17 2135 04/29/17 0456  BP: 136/72 137/74  Pulse: (!) 114 (!) 111  Resp: 18 20  Temp: (!) 97.3 F (36.3 C) 98.7 F (37.1 C)  SpO2: 98% 95%    Body mass index is 27.65 kg/m.  Intake/Output Summary (Last 24 hours) at 04/29/2017 1012 Last data filed at 04/29/2017 0803 Gross per 24 hour  Intake 2890 ml  Output 1100 ml  Net 1790 ml     CBG (last 3)  No results for input(s): GLUCAP in the last 72 hours.   Labs:  Lab Results  Component Value Date   WBC 7.4 04/29/2017   HGB 5.7 (LL) 04/29/2017   HCT 17.3 (L) 04/29/2017   MCV 87.4 04/29/2017   PLT 134 (L) 04/29/2017   NEUTROABS 5.8 04/29/2017    _0 @  Urine Studies No results for input(s): UHGB, CRYS in the last 72 hours.  Invalid input(s): UACOL, UAPR, USPG, UPH, UTP, UGL, [address], UBIL, UNIT, UROB, [place], UEPI, UWBC, [place], [place]  Basic Metabolic Panel: Recent Labs  Lab 04/25/17 0557 04/25/17 1752 04/26/17 1021 04/27/17 0346 04/28/17 0342 04/29/17 0321  NA 136 138 139 140 141 141  K 2.3* 2.7* 3.0* 4.2 4.7 5.3*  CL 97* 105 105 111 114* 114*  CO2 _1 21*  GLUCOSE 104* 104* 95 72 83 72  BUN _2 <5* 8 7  CREATININE 1.06* 0.70 0.58 0.58 0.49 0.50  CALCIUM 5.9* 5.3* 5.9* 5.9* 6.9* 7.5*  MG 1.5* 1.7  --  2.0  --   --    GFR Estimated Creatinine Clearance: 85.6 mL/min (by C-G formula based on SCr of 0.5 mg/dL). Liver Function Tests: Recent Labs  Lab 04/25/17 0557 04/25/17 1752 04/26/17 1021  AST 147* 117* 117*  ALT 85* 68* 65*   ALKPHOS 47 42 42  BILITOT 1.8* 1.6* 1.3*  PROT 7.1 6.1* 6.2*  ALBUMIN 3.8 3.2* 3.3*   No results for input(s): LIPASE, AMYLASE in the last 168 hours. No results for input(s): AMMONIA in the last 168 hours. Coagulation profile Recent Labs  Lab 04/25/17 0557  INR 1.24    CBC: Recent Labs  Lab 04/25/17 0557 04/27/17 0930 04/28/17 0342 04/29/17 0321  WBC 9.5 7.7 6.4 7.4  NEUTROABS 6.4 5.7 5.9 5.8  HGB 6.4* 8.4* 7.9* 5.7*  HCT 19.3* 25.3* 24.5* 17.3*  MCV 83.9 86.1 87.2 87.4  PLT 316 149* 118* 134*   Cardiac Enzymes: Recent Labs  Lab 04/25/17 0557  CKTOTAL 557*   BNP: Invalid input(s): POCBNP CBG: No results for input(s): GLUCAP in the last 168 hours. D-Dimer No results for input(s): DDIMER in the last 72 hours. Hgb A1c No results for input(s): HGBA1C in the last 72 hours. Lipid Profile No results for input(s): CHOL, HDL, LDLCALC, TRIG, CHOLHDL, LDLDIRECT in the last 72 hours. Thyroid function studies No results for input(s): TSH, T4TOTAL, T3FREE, THYROIDAB in the last 72 hours.  Invalid input(s): FREET3 Anemia work up [employer]  04/27/17 0930  VITAMINB12 1,262*  FOLATE 11.1  FERRITIN 1,982*  RETICCTPCT 2.7   Microbiology Recent Results (from the past 240 hour(s))  Culture, blood (Routine x 2)     Status: None (Preliminary result)   Collection Time: 04/25/17  5:57 AM  Result Value Ref Range Status   Specimen Description BLOOD PORTA CATH  Final   Special Requests   Final    BOTTLES DRAWN AEROBIC AND ANAEROBIC Blood Culture adequate volume   Culture   Final    NO GROWTH 3 DAYS Performed at [facility], [place] [address] [place], [place] [address]    Report Status PENDING  Incomplete  Urine C&S     Status: None   Collection Time: 04/25/17  8:37 AM  Result Value Ref Range Status   Specimen Description URINE, CLEAN CATCH  Final   Special Requests Immunocompromised  Final   Culture   Final    NO GROWTH Performed at [facility],  [place] [address], [place], [place] [address]    Report Status 04/26/2017 FINAL  Final      Studies:  Mr Lumbar Spine Wo Contrast  Result Date: 04/29/2017 CLINICAL DATA:  Initial evaluation for back pain, buttock pain with weakness. History of metastatic breast cancer. EXAM: MRI LUMBAR SPINE WITHOUT CONTRAST TECHNIQUE: Multiplanar, multisequence MR imaging of the lumbar spine was performed. No intravenous contrast was administered. COMPARISON:  Prior CT from 04/25/2017. FINDINGS: Segmentation: Study moderately degraded by motion artifact. Additionally, patient was unable to tolerate the full length of the exam. No contrast was administered. Normal segmentation with the lowest well-formed disc labeled the L5-S1 level. Alignment: Normal alignment with preservation of the normal lumbar lordosis. No listhesis. Vertebrae: Bone marrow signal intensity is diffusely abnormal, likely related to diffuse osseous metastases. Vertebral body heights are relatively maintained without evidence for pathologic fracture. Large metastatic implant involving the left aspect of the L4 vertebral body with extension into the left pedicle. Implant measures approximately 5.3 x 4.3 cm in greatest dimensions. Extra osseous extension with tumor in the adjacent paraspinous soft tissues. Epidural tumor present within the ventral and left lateral epidural space at this level (series 7, image 24). Extension into the left L3-4 and L4-5 neural foramina. The Additional metastatic implant involving the right sacral ala demonstrates early invasion into the right S1 neural foramen (series 7, image 38). Small amount of epidural tumor just inferiorly as well (series 7, image 40). No other significant epidural tumor identified on this motion degraded exam. Conus medullaris and cauda equina: Conus extends to the L1 level. Conus and cauda equina appear normal. Paraspinal and other soft tissues: Soft tissue edema within the left paraspinous and psoas  musculature adjacent to the left-sided metastasis at the L4 level. Left psoas muscle is displaced anteriorly and laterally. Abnormal edema within the left iliacus insula as well. Scattered left iliac nodes measure up to 2 cm in short axis, likely nodal metastases. Known hepatic metastases partially visualize. He scattered cystic lesions noted within the kidneys. Disc levels: L1-2:  Unremarkable. L2-3: Diffuse disc bulge. Mild facet hypertrophy. No significant canal stenosis. Mild bilateral L2 foraminal narrowing. L3-4: Diffuse disc bulge with disc desiccation. Tumor involves the left L3-4 neural foramen. Mild spinal stenosis. Moderate to severe left L3 foraminal narrowing due to metastasis. Right neural foramen patent. L4-5: Mild disc bulge. Tumor extends into the left L4-5 neural foramen. Epidural tumor within the ventral and lateral epidural space posterior to the L4 vertebral body. Secondary moderate  spinal stenosis. Thecal sac measures 9 mm in AP diameter at its most narrow point. Moderate left L4 foraminal narrowing. No significant right foraminal encroachment. L5-S1: Diffuse disc bulge with disc desiccation. Epidural lipomatosis. No significant stenosis. IMPRESSION: 1. Limited exam due to patient's inability to tolerate the full length of the study. Additionally, images provided are degraded by motion artifact. 2. Findings consistent with diffuse osseous metastases. 3. Large metastatic implant centered at the left aspect of the L4 with associated paraspinous and epidural extension as above. Tumor extends into the left L3-4 and L4-5 neural foramina. Associated moderate spinal stenosis at the level of L4. 4. Additional metastasis at the right sacral ala with extension into the right S1 neural foramen, likely affecting the transiting right S1 nerve. 5. No associated pathologic fracture at this time. 6. Hepatic and left iliac nodal metastases, partially visualized, better evaluated on recent CT. 7. Mild multilevel  degenerative changes as above. Electronically Signed   By: Jeannine Boga M.D.   On: 04/29/2017 00:21   Dg Hip Unilat With Pelvis 2-3 Views Left  Result Date: 04/27/2017 CLINICAL DATA:  Acute onset of severe left hip pain which began this morning. No known injury. Current history of stage IV breast cancer for which the patient receiving chemotherapy. EXAM: DG HIP (WITH OR WITHOUT PELVIS) 2-3V LEFT COMPARISON:  None. FINDINGS: No evidence of acute fracture or dislocation. Well preserved joint space. Well preserved bone mineral density. No intrinsic osseous abnormality. Included AP pelvis demonstrates a normal-appearing contralateral right hip. Sacroiliac joints and symphysis pubis intact. Visualized lower lumbar spine unremarkable. Opaque material is present external to the patient overlying the inner thigh. IMPRESSION: Normal examination. Electronically Signed   By: Evangeline Dakin M.D.   On: 04/27/2017 20:00    Assessment: 51 y.o. [place] woman status post right breast upper outer quadrant biopsy 12/14/2015 for a clinical T1c N0, stage IA  invasive ductal carcinoma, grade 3, triple negative, with an MIB-1 of 70%             (a) this stages as IIB In the 2018 new Prognostic classification  (1) genetics testing negative for mutations within any of 20 genes on the Breast/Ovarian Cancer Panel through [employer]. Additionally, no VUS were found.   (2) neoadjuvant chemotherapy consisting of doxorubicin and cyclophosphamide in dose this fashion 4, completed 02/22/2016, followed by paclitaxel weekly 5 (of 12 treatments planned), starting 03/08/2016, stopped 04/05/2016 after cycle 5 because of neuropathy             (a) cycle 4 of cyclophosphamide and doxorubicin held 1 week because of side effects  (3) status post right lumpectomy and sentinel lymph node sampling 05/05/2016 for a residual ypT2 ypN) invasive ductal carcinoma, grade 3, the repeat prognostic panel again triple negative  the residual cancer burden was 2  (4) adjuvant radiation 06/20/16 - 08/04/16 : 50.4 Gy to the right breast plus a 10 Gy boost  (5) left thyroid nodule biopsy 04/14/2016 read as [facility] 1  METASTATIC DISEASE: October 2018 (6) MRI of the abdomen March 01, 2017 shows multiple lesions in the lungs, liver, and bones             (a) the tumor is PD-L1 negative--not a candidate for pembrolizumab   (b) CA 27-29 not informative  (7) started capecitabine March 14, 2017;              (a) discontinued 03/19/2017 with multiple side effects   (8) started denosumab/Xgeva March 10, 2017, most  recent dose 79/89/2119  (a) complicated by severe hypocalcemia  (9) uncontrolled pain: .control complicated by nausea/vomiting on hydromorphone, urinary retention on hydromorphone/fentanyl  (a) lumbar MRI shows large met at L4, likely main cause of her pain  (b) radiation oncology consulted 04/29/2017 for palliative treatment  (10) started CMF chemotherapy 04/06/2017, to be repeated every 21 days  (a) 2d cycle delayed because of uncontrolled pain leading to admission  (11) severe anemia--no B-12, folate or iron deficiency; very elevated LDH and mild hyperbilirubinemia but negative Coombs and normal haptoglobin; no schistocytes on smear; retics 79.4; nl creatinine  (a) RBC underproduction secondary to cancer and chemo; consider bleeding as cause  (12) electrolyte abnormalities-- being corrected.         REVIEW OF BLOOD FILM 04/28/2017: Don't see schistocytes; no nucleated RBC's or immature WBCs-- no evidence of leukoerythroblastic picture; no platelets clumps; some teardrops but no significant RBC anisocytosis  Plan:  Joannah's pain is somewhat better controlled. Hopefully with TD fentanyl 25 mch/h as base we can bring her pain down to a manageable level; we then need to deal with urinary retention (the pelvic tumor may be contributing) and nausea/vomiting from dilaudid.  I discussed the case with  radiation oncology this AM (Dr Lisbeth Renshaw). They will proceed to treatment as soon as feasible-- may be 12/24 before they can start. I anticipate after a brief course of treatment to L3-S1 her pain will improve considerably and hopefully the urinary retention also may resolve  She is severely anemic today--Hb is being repeated. Workup does not show hemolysis or nutritional deficiency and her reticulocyte count should be at least maintaining her Hb stable if not quite correcting it-- concern is possible bleeding.  She had a very high LDH level suggesting necrosis (in liver). I am holding her next CMF cycle until she is more stable and the LDH more normal--likely first week in [place] but possible late next week  I will check on her 12/24. Please consult my partners for any other questions before then    Bobetta Lime, MD 04/29/2017  10:12 AM Medical Oncology and Hematology [facility] [address] [place], [place] [address] Tel. [phone]    Fax. [phone]

## 2017-04-29 NOTE — Consult Note (Addendum)
Consultation Note Date: 04/29/2017   Patient Name: Lindsay [name]  DOB: [birth year]  MRN: [id number]  Age / Sex: 51 y.o., female  PCP: Bernerd Limbo, MD Referring Physician: Aileen Fass, Tammi Klippel, MD  Reason for Consultation: Pain control  HPI/Patient Profile: 51 y.o. female  with past medical history of HTN, depression, and triple negative breast CA with mets to lung, bone, and liver admitted on 04/25/2017 with urinary retention.  She was found to have prolonged QT and multiple metabolic derangements.  Still with significant pain predominantly in left hip.  Palliative consulted for assistance with symptom management with pain.   Clinical Assessment and Goals of Care: I reviewed chart and discussed with Dr. Venetia Constable.  Ms. Altmann has been having difficult to control pain.  Was on PCA, but this was d/c today.  Now using fentanyl patch with breakthrough oral dilaudid.  I met with Ms. Laurance Flatten, her husband, and her mother.    She is awake, but sleepy.  SHe can participate in conversation but falls asleep multiple times during conversation.  She reports having "awful" night last night.  Her husband is still upset that she was hurting all night long and focused much of conversation on this.  We reviewed her worse pain being in left hip, worse with certain positional changes, but otherwise she has no exacerbating factors she has identified.  Quickly worsens to 10/10.  ? Radiation through buttock.  MRI is pending.  Her mother reports being concerned that she is on too much medication as she is currently sleepy.   SUMMARY OF RECOMMENDATIONS   - As PCA is already discontinued, will see how she does overnight with current regimen of fentanyl patch (31mg/hr) with PO dilaudid as breakthrough medication.  I also added on second line IV morphine (IV dilaudid causes nausea and concern to minimize antiemetics with prolonged QT) in  case PO medication is not adequate to control pain. - F/u tomorrow and review overnight usage with further recommendations to follow at that time.  Code Status/Advance Care Planning:  Full code   Symptom Management:   As above  Palliative Prophylaxis:   Bowel Regimen and Frequent Pain Assessment  Additional Recommendations (Limitations, Scope, Preferences):  Full Scope Treatment  Psycho-social/Spiritual:   Desire for further Chaplaincy support:Did not address today  Additional Recommendations: Caregiving  Support/Resources  Prognosis:   Unable to determine  Discharge Planning: To Be Determined      Primary Diagnoses: Present on Admission: . Urinary retention . Breast cancer [place]as[place] to bone ([facility] . Liver metastases ([facility] . Lung metastases ([facility]   I have reviewed the medical record, interviewed the patient and family, and examined the patient. The following aspects are pertinent.  Past Medical History:  Diagnosis Date  . Allergy    seasonal  . Arthritis   . Breast cancer of upper-outer quadrant of right [place] breast [place] 12/16/[place]  . Depression    mild depression after death of daughter- no meds  . History of radiation therapy 06/20/17-08/04/16   right breast 50.4  gy in 28 fractions, boost 10 Gy in 5 fractions  . Hot flashes   . Hx of adenomatous colonic polyps 04/25/2014  . Hypertension    Social History   Socioeconomic History  . Marital status: Married    Spouse name: None  . Number of children: None  . Years of education: None  . Highest education level: None  Social Needs  . Financial resource strain: None  . Food insecurity - worry: None  . Food insecurity - inability: None  . Transportation needs - medical: None  . Transportation needs - non-medical: None  Occupational History  . None  Tobacco Use  . Smoking status: Never Smoker  . Smokeless tobacco: Never Used  Substance and Sexual Activity  . Alcohol use: No    Alcohol/week:  0.0 oz  . Drug use: No  . Sexual activity: Yes    Birth control/protection: Surgical  Other Topics Concern  . None  Social History Narrative  . None   Family History  Problem Relation Age of Onset  . Hypertension Mother   . Breast cancer Maternal Grandfather   . Breast cancer Other   . Breast cancer Cousin   . Sudden death Neg Hx   . Diabetes Neg Hx   . Heart attack Neg Hx   . Hyperlipidemia Neg Hx   . Colon cancer Neg Hx   . Esophageal cancer Neg Hx   . Rectal cancer Neg Hx   . Stomach cancer Neg Hx    Scheduled Meds: . calcium carbonate (dosed in mg elemental calcium)  1,000 mg of elemental calcium Oral Once  . chlorhexidine  15 mL Mouth Rinse BID  . enoxaparin (LOVENOX) injection  40 mg Subcutaneous Q24H  . fentaNYL  25 mcg Transdermal Q72H  . mouth rinse  15 mL Mouth Rinse q12n4p  . prochlorperazine  10 mg Intravenous TID AC   Continuous Infusions: . 0.9 % NaCl with KCl 40 mEq / L 100 mL/hr (04/28/17 1720)   PRN Meds:.ALPRAZolam, diphenhydrAMINE **OR** diphenhydrAMINE, fentaNYL (SUBLIMAZE) injection, HYDROmorphone, iopamidol, ketorolac, LORazepam, naloxone **AND** sodium chloride flush, ondansetron (ZOFRAN) IV, sodium chloride flush Medications Prior to Admission:  Prior to Admission medications   Medication Sig Start Date End Date Taking? Authorizing Provider  ALPRAZolam Duanne Moron) 0.5 MG tablet Take 0.5 mg by mouth daily as needed for anxiety or sleep.  03/02/17  Yes [provider]  Cyclobenzaprine HCl (FLEXERIL PO) Take 5 mg by mouth 3 (three) times daily as needed (muscle spasms).   Yes [provider]  dexamethasone (DECADRON) 4 MG tablet Take 2 tablets (8 mg total) by mouth daily. Start the day after chemotherapy for 2 days. Take with food. 04/06/17  Yes Magrinat, Virgie Dad, MD  EPINEPHrine (EPIPEN 2-PAK) 0.3 mg/0.3 mL IJ SOAJ injection Inject 0.3 mg into the muscle once as needed (anaphylaxis allergic reaction).  01/01/14  Yes [provider]  fentaNYL (DURAGESIC - DOSED MCG/HR) 25 MCG/HR patch Place 1 patch (25 mcg total) onto the skin every 3 (three) days. 04/20/17  Yes Causey, Charlestine Massed, NP  lidocaine-prilocaine (EMLA) cream Apply to affected area once 04/06/17  Yes Magrinat, Virgie Dad, MD  ondansetron (ZOFRAN) 8 MG tablet Take 1 tablet (8 mg total) by mouth 2 (two) times daily as needed for refractory nausea / vomiting. Start on day 3 after chemotherapy. 04/06/17  Yes Magrinat, Virgie Dad, MD  venlafaxine XR (EFFEXOR-XR) 150 MG 24 hr capsule Take 1 capsule (150 mg total) by mouth daily. 04/13/17  Yes Causey, Charlestine Massed, NP  fexofenadine (ALLEGRA) 180 MG tablet Take 180 mg by mouth daily as needed (for allergies.).     [provider]  HYDROmorphone (DILAUDID) 2 MG tablet Take 1-2 tablets (2-4 mg total) by mouth every 4 (four) hours as needed for severe pain. Patient not taking: Reported on 04/13/2017 04/06/17   Magrinat, Virgie Dad, MD  ibuprofen (ADVIL,MOTRIN) 800 MG tablet Take 1 tablet (800 mg total) by mouth 3 (three) times daily. Patient not taking: Reported on 04/25/2017 04/11/17   Magrinat, Virgie Dad, MD  Ibuprofen-Diphenhydramine HCl (ADVIL PM) 200-25 MG CAPS Take 1 tablet by mouth at bedtime as needed (sleep).    [provider]  lisinopril-hydrochlorothiazide (PRINZIDE,ZESTORETIC) 20-12.5 MG per tablet Take 1 tablet by mouth daily.    [provider]  NON FORMULARY 1 Syringe every 7 (seven) days. Allergy Shot    [provider]  potassium chloride SA (K-DUR,KLOR-CON) 20 MEQ tablet Take 1 tablet (20 mEq total) by mouth once for 1 dose. 04/06/17 04/06/17  Magrinat, Virgie Dad, MD  prochlorperazine (COMPAZINE) 10 MG tablet Take 1 tablet (10 mg total) every 6 (six) hours as needed by mouth (Nausea or vomiting). Patient not taking: Reported on 04/25/2017 03/24/17   Magrinat, Virgie Dad, MD   Allergies  Allergen Reactions  . Gabapentin Other (See Comments)    "light headed, dizziness,  didn't like the way it made me feel"  . Dilaudid [Hydromorphone Hcl] Nausea And Vomiting  . Percocet [Oxycodone-Acetaminophen] Diarrhea and Nausea And Vomiting  . Pollen Extract Other (See Comments)    Runny nose, itchy eyes and sneezing due to seasonal allergies.  . Tramadol Nausea And Vomiting  . Vicodin [Hydrocodone-Acetaminophen] Nausea And Vomiting   Review of Systems  Constitutional: Positive for activity change, appetite change and fatigue.  Musculoskeletal: Positive for arthralgias, back pain and myalgias.  Neurological: Positive for weakness.  Psychiatric/Behavioral: Positive for sleep disturbance. The patient is nervous/anxious.     Physical Exam General: Sleepy, but alert, in no acute distress.  HEENT: No bruits, no goiter, no JVD Heart: Regular rate and rhythm. No murmur appreciated. Lungs: Good air movement, clear Abdomen: Soft, nontender, nondistended, positive bowel sounds.  Ext: No significant edema Skin: Warm and dry Neuro: Grossly intact, nonfocal.   Vital Signs: BP 137/74 (BP Location: Left Arm)   Pulse (!) 111   Temp 98.7 F (37.1 C) (Oral)   Resp 20   Ht 5' 6"  (1.676 m)   Wt 77.7 kg (171 lb 4.8 oz)   SpO2 95%   BMI 27.65 kg/m  Pain Assessment: 0-10 POSS *See Group Information*: S-Acceptable,Sleep, easy to arouse Pain Score: 7    SpO2: SpO2: 95 % O2 Device:SpO2: 95 % O2 Flow Rate: .O2 Flow Rate (L/min): 0 L/min  IO: Intake/output summary:   Intake/Output Summary (Last 24 hours) at 04/29/2017 0834 Last data filed at 04/29/2017 1856 Gross per 24 hour  Intake 2890 ml  Output 1100 ml  Net 1790 ml    LBM: Last BM Date: 04/28/17 Baseline Weight: Weight: 77.1 kg (170 lb) Most recent weight: Weight: 77.7 kg (171 lb 4.8 oz)     Palliative Assessment/Data:   Flowsheet Rows     Most Recent Value  Intake Tab  Referral Department  Hospitalist  Unit at Time of Referral  Oncology Unit  Palliative Care Primary Diagnosis  Cancer  Date Notified   04/28/17  Palliative Care Type  New Palliative care  Reason for referral  Pain  Date  of Admission  04/25/17  Date first seen by Palliative Care  04/28/17  # of days Palliative referral response time  0 Day(s)  # of days IP prior to Palliative referral  3  Clinical Assessment  Palliative Performance Scale Score  50%  Pain Max last 24 hours  10  Pain Min Last 24 hours  5  Psychosocial & Spiritual Assessment  Palliative Care Outcomes  Patient/Family meeting held?  No      Time In: 1615 Time Out: 1715 Time Total: 60 Greater than 50%  of this time was spent counseling and coordinating care related to the above assessment and plan.  Signed by: Micheline Rough, MD   Please contact Palliative Medicine Team phone at [phone] for questions and concerns.  For individual provider: See Shea Evans

## 2017-04-29 NOTE — Consult Note (Signed)
Radiation Oncology         [phone] ________________________________  Name: Lindsay [name] MRN: [id number]  Date: 04/25/2017  DOB: [birth year]  YO:VZCHYI, Lindsay Brow, MD    REFERRING PHYSICIAN: Dr. Gunnar Bulla Magrinat  DIAGNOSIS: The primary encounter diagnosis was Dehydration. Diagnoses of Hypocalcemia, Hypokalemia, Acute anemia, Hypomagnesemia, Malignant neoplasm of upper-outer quadrant of right breast in female, estrogen receptor negative ([place]), Pain, and Back pain were also pertinent to this visit.   HISTORY OF PRESENT ILLNESS::Lindsay [name] is a 51 y.o. female who is seen for an initial consultation visit regarding the patient's diagnosis of metastatic breast cancer.  The patient unfortunately has an aggressive cancer with widespread metastatic disease.  She has been complaining of significant pain which she attributes to the left hip region extending to the left leg.  This is currently her dominant complaint and has been associated with some lower back pain as well.  An x-ray of the left hip did not reveal any cause for this pain.  A subsequent MRI scan of the lumbar spine revealed a large tumor at the L4 level with soft tissue extension which likely is the culprit for the patient's pain.  She also does have one additional significant area in the sacrum on the right.  The patient is a good candidate for palliative radiation treatment to the L spine/sacrum and we discussed proceeding with radiation treatment early next week.  Additional complaints include the patient describing a history of being unsteady on her feet for several weeks.  She also had some urinary retention for which a Foley catheter was placed but this has been removed.  The patient has been able to go since then although the husband states not quite as much today.  She has continued to have bowel movements although she has had some constipation.  No saddle anesthesia.    PREVIOUS RADIATION THERAPY: Yes the patient previously received  adjuvant radiation treatment for her breast cancer with Dr. Freddi Che in our department   PAST MEDICAL HISTORY:  has a past medical history of Allergy, Arthritis, Breast cancer of upper-outer quadrant of right female breast ([place]) (12/16/2015), Depression, History of radiation therapy (06/20/17-08/04/16), Hot flashes, adenomatous colonic polyps (04/25/2014), and Hypertension.     PAST SURGICAL HISTORY: Past Surgical History:  Procedure Laterality Date  . ABDOMINAL HYSTERECTOMY    . BREAST LUMPECTOMY WITH RADIOACTIVE SEED AND SENTINEL LYMPH NODE BIOPSY Right 05/05/2016   Procedure: BREAST LUMPECTOMY WITH RADIOACTIVE SEED AND SENTINEL LYMPH NODE BIOPSY;  Surgeon: Rolm Bookbinder, MD;  Location: [facility];  Service: General;  Laterality: Right;  . CHOLECYSTECTOMY N/A 09/09/2015   Procedure: LAPAROSCOPIC CHOLECYSTECTOMY;  Surgeon: Ralene Ok, MD;  Location: [facility];  Service: General;  Laterality: N/A;  . COLONOSCOPY    . KNEE ARTHROSCOPY  2016  . PERIPHERAL VASCULAR CATHETERIZATION Right 05/05/2016   Procedure: PORTA CATH REMOVAL;  Surgeon: Rolm Bookbinder, MD;  Location: [facility];  Service: General;  Laterality: Right;  . PORTACATH PLACEMENT Right 12/29/2015   Procedure: INSERTION PORT-A-CATH WITH ULTRASOUND GUIDANCE;  Surgeon: Rolm Bookbinder, MD;  Location: [facility];  Service: General;  Laterality: Right;  . PORTACATH PLACEMENT Right 04/06/2017   Procedure: INSERTION PORT-A-CATH WITH [place];  Surgeon: Rolm Bookbinder, MD;  Location: [facility];  Service: General;  Laterality: Right;     FAMILY HISTORY: family history includes Breast cancer in her cousin, maternal grandfather, and other; Hypertension in her mother.   SOCIAL HISTORY:  reports that  has never smoked.  she has never used smokeless tobacco. She reports that she does not drink alcohol or use drugs.   ALLERGIES: Gabapentin; Dilaudid [hydromorphone hcl]; Percocet [oxycodone-acetaminophen]; Pollen  extract; Tramadol; and Vicodin [hydrocodone-acetaminophen]   MEDICATIONS:  Current Facility-Administered Medications  Medication Dose Route Frequency Provider Last Rate Last Dose  . ALPRAZolam Duanne Moron) tablet 0.5 mg  0.5 mg Oral Daily PRN Caren Griffins, MD   0.5 mg at 04/29/17 1224  . calcium carbonate (dosed in mg elemental calcium) suspension 1,000 mg of elemental calcium  1,000 mg of elemental calcium Oral Once Aileen Fass, Tammi Klippel, MD      . chlorhexidine (PERIDEX) 0.12 % solution 15 mL  15 mL Mouth Rinse BID Caren Griffins, MD   15 mL at 04/29/17 1030  . diphenhydrAMINE (BENADRYL) injection 12.5 mg  12.5 mg Intravenous Q6H PRN Charlynne Cousins, MD       Or  . diphenhydrAMINE (BENADRYL) 12.5 MG/5ML elixir 12.5 mg  12.5 mg Oral Q6H PRN Charlynne Cousins, MD      . enoxaparin (LOVENOX) injection 40 mg  40 mg Subcutaneous Q24H Caren Griffins, MD   40 mg at 04/28/17 2155  . fentaNYL (DURAGESIC - dosed mcg/hr) patch 25 mcg  25 mcg Transdermal Q72H Magrinat, Virgie Dad, MD   25 mcg at 04/28/17 1019  . fentaNYL (SUBLIMAZE) injection 25 mcg  25 mcg Intravenous Q1H PRN Domingo Cocking, Gene, MD      . HYDROmorphone (DILAUDID) tablet 4 mg  4 mg Oral Q2H PRN Magrinat, Virgie Dad, MD   4 mg at 04/29/17 1622  . iopamidol (ISOVUE-300) 61 % injection 100 mL  100 mL Intravenous Once PRN Caren Griffins, MD      . LORazepam (ATIVAN) injection 0.5 mg  0.5 mg Intravenous Q6H PRN Charlynne Cousins, MD   0.5 mg at 04/27/17 1640  . MEDLINE mouth rinse  15 mL Mouth Rinse q12n4p Caren Griffins, MD   15 mL at 04/29/17 1615  . naloxone [facility]) injection 0.4 mg  0.4 mg Intravenous PRN Charlynne Cousins, MD       And  . sodium chloride flush (NS) 0.9 % injection 9 mL  9 mL Intravenous PRN Charlynne Cousins, MD      . ondansetron [facility]) injection 4 mg  4 mg Intravenous Q6H PRN Charlynne Cousins, MD      . prochlorperazine (COMPAZINE) injection 10 mg  10 mg Intravenous TID AC Magrinat, Virgie Dad,  MD   10 mg at 04/29/17 1616  . sodium chloride flush (NS) 0.9 % injection 10-40 mL  10-40 mL Intracatheter PRN Caren Griffins, MD   20 mL at 04/29/17 0335     REVIEW OF SYSTEMS:  A 15 point review of systems is documented in the electronic medical record. This was obtained by the nursing staff. However, I reviewed this with the patient to discuss relevant findings and make appropriate changes.  Pertinent items are noted in HPI.    PHYSICAL EXAM:  height is 5\' 6"  (1.676 m) and weight is 171 lb 4.8 oz (77.7 kg). Her oral temperature is 99.3 F (37.4 C). Her blood pressure is 149/80 (abnormal) and her pulse is 128 (abnormal). Her respiration is 20 and oxygen saturation is 95%.   ECOG = 3  0 - Asymptomatic (Fully active, able to carry on all predisease activities without restriction)  1 - Symptomatic but completely ambulatory (Restricted in physically strenuous activity but ambulatory and able to carry out work  of a light or sedentary nature. For example, light housework, office work)  2 - Symptomatic, <50% in bed during the day (Ambulatory and capable of all self care but unable to carry out any work activities. Up and about more than 50% of waking hours)  3 - Symptomatic, >50% in bed, but not bedbound (Capable of only limited self-care, confined to bed or chair 50% or more of waking hours)  4 - Bedbound (Completely disabled. Cannot carry on any self-care. Totally confined to bed or chair)  5 - Death   Eustace Pen MM, Creech RH, Tormey DC, et al. [phone]). "Toxicity and response criteria of the [facility]". [place] Oncol. 5 (6): 649-55  Alert, no acute distress The patient had good plantar flexion and dorsiflexion bilaterally, 5 out of 5.  Decreased hip flexion bilaterally, 4 out of 5 on the right and 3 out of 5 on the left.  The patient's bed was elevated which appeared to contribute to some difficulty in this regard.   LABORATORY DATA:  Lab Results  Component  Value Date   WBC 7.4 04/29/2017   HGB 8.0 (L) 04/29/2017   HCT 24.5 (L) 04/29/2017   MCV 87.4 04/29/2017   PLT 134 (L) 04/29/2017   Lab Results  Component Value Date   NA 138 04/29/2017   K 5.1 04/29/2017   CL 110 04/29/2017   CO2 21 (L) 04/29/2017   Lab Results  Component Value Date   ALT 65 (H) 04/26/2017   AST 117 (H) 04/26/2017   ALKPHOS 42 04/26/2017   BILITOT 1.3 (H) 04/26/2017      RADIOGRAPHY: Mr Lumbar Spine Wo Contrast  Result Date: 04/29/2017 CLINICAL DATA:  Initial evaluation for back pain, buttock pain with weakness. History of metastatic breast cancer. EXAM: MRI LUMBAR SPINE WITHOUT CONTRAST TECHNIQUE: Multiplanar, multisequence MR imaging of the lumbar spine was performed. No intravenous contrast was administered. COMPARISON:  Prior CT from 04/25/2017. FINDINGS: Segmentation: Study moderately degraded by motion artifact. Additionally, patient was unable to tolerate the full length of the exam. No contrast was administered. Normal segmentation with the lowest well-formed disc labeled the L5-S1 level. Alignment: Normal alignment with preservation of the normal lumbar lordosis. No listhesis. Vertebrae: Bone marrow signal intensity is diffusely abnormal, likely related to diffuse osseous metastases. Vertebral body heights are relatively maintained without evidence for pathologic fracture. Large metastatic implant involving the left aspect of the L4 vertebral body with extension into the left pedicle. Implant measures approximately 5.3 x 4.3 cm in greatest dimensions. Extra osseous extension with tumor in the adjacent paraspinous soft tissues. Epidural tumor present within the ventral and left lateral epidural space at this level (series 7, image 24). Extension into the left L3-4 and L4-5 neural foramina. The Additional metastatic implant involving the right sacral ala demonstrates early invasion into the right S1 neural foramen (series 7, image 38). Small amount of epidural tumor  just inferiorly as well (series 7, image 40). No other significant epidural tumor identified on this motion degraded exam. Conus medullaris and cauda equina: Conus extends to the L1 level. Conus and cauda equina appear normal. Paraspinal and other soft tissues: Soft tissue edema within the left paraspinous and psoas musculature adjacent to the left-sided metastasis at the L4 level. Left psoas muscle is displaced anteriorly and laterally. Abnormal edema within the left iliacus insula as well. Scattered left iliac nodes measure up to 2 cm in short axis, likely nodal metastases. Known hepatic metastases partially visualize. He scattered  cystic lesions noted within the kidneys. Disc levels: L1-2:  Unremarkable. L2-3: Diffuse disc bulge. Mild facet hypertrophy. No significant canal stenosis. Mild bilateral L2 foraminal narrowing. L3-4: Diffuse disc bulge with disc desiccation. Tumor involves the left L3-4 neural foramen. Mild spinal stenosis. Moderate to severe left L3 foraminal narrowing due to metastasis. Right neural foramen patent. L4-5: Mild disc bulge. Tumor extends into the left L4-5 neural foramen. Epidural tumor within the ventral and lateral epidural space posterior to the L4 vertebral body. Secondary moderate spinal stenosis. Thecal sac measures 9 mm in AP diameter at its most narrow point. Moderate left L4 foraminal narrowing. No significant right foraminal encroachment. L5-S1: Diffuse disc bulge with disc desiccation. Epidural lipomatosis. No significant stenosis. IMPRESSION: 1. Limited exam due to patient's inability to tolerate the full length of the study. Additionally, images provided are degraded by motion artifact. 2. Findings consistent with diffuse osseous metastases. 3. Large metastatic implant centered at the left aspect of the L4 with associated paraspinous and epidural extension as above. Tumor extends into the left L3-4 and L4-5 neural foramina. Associated moderate spinal stenosis at the level  of L4. 4. Additional metastasis at the right sacral ala with extension into the right S1 neural foramen, likely affecting the transiting right S1 nerve. 5. No associated pathologic fracture at this time. 6. Hepatic and left iliac nodal metastases, partially visualized, better evaluated on recent CT. 7. Mild multilevel degenerative changes as above. Electronically Signed   By: Jeannine Boga M.D.   On: 04/29/2017 00:21   Ct Abdomen Pelvis W Contrast  Result Date: 04/25/2017 CLINICAL DATA:  Urinary retention.History of metastatic disease. EXAM: CT ABDOMEN AND PELVIS WITH CONTRAST TECHNIQUE: Multidetector CT imaging of the abdomen and pelvis was performed using the standard protocol following bolus administration of intravenous contrast. CONTRAST:  176mL ISOVUE-300 IOPAMIDOL (ISOVUE-300) INJECTION 61% COMPARISON:  Abdominal MRI 03/01/2017.  CT abdomen 08/14/2015. FINDINGS: Lower chest: Heart unremarkable. Innumerable bilateral pulmonary nodules identified. Dominant nodule in the medial left lung base measures 18 x 12 mm on image 17 of series 7. Hepatobiliary: Multiple liver metastases are evident. 4.4 cm lesion in the medial segment left liver appears to be the same lesion that was about 14 mm on the previous MRI. Many of the hepatic lesions appear new in the interval and most of the lesions range in size from 5 mm up to about 14 mm. Gallbladder surgically absent. No intrahepatic or extrahepatic biliary dilation. Pancreas: No focal mass lesion. No dilatation of the main duct. No intraparenchymal cyst. No peripancreatic edema. Spleen: No splenomegaly. No focal mass lesion. Adrenals/Urinary Tract: No adrenal nodule or mass. Kidneys unremarkable. No evidence for hydroureter. Foley catheter decompresses the urinary bladder. Stomach/Bowel: Stomach is nondistended. No gastric wall thickening. No evidence of outlet obstruction. Duodenum is normally positioned as is the ligament of Treitz. No small bowel wall  thickening. No small bowel dilatation. The terminal ileum is normal. The appendix is normal. No gross colonic mass. No colonic wall thickening. No substantial diverticular change. Vascular/Lymphatic: No abdominal aortic aneurysm. No abdominal aortic atherosclerotic calcification. There is no gastrohepatic or hepatoduodenal ligament lymphadenopathy. No intraperitoneal or retroperitoneal lymphadenopathy. 11 mm short axis necrotic left common iliac lymph nodes seen image 38 series 3. 19 mm necrotic left iliac lymph node visible on image 46. Reproductive: Uterus surgically absent.  There is no adnexal mass. Other: No intraperitoneal free fluid. Musculoskeletal: Lytic bone lesions are seen scattered through the thoracolumbar spine and bony pelvis. IMPRESSION: 1. Metastatic disease in the  liver has progressed since MRI of 03/01/2017. 2. Necrotic left iliac lymphadenopathy compatible with metastatic disease. 3. Innumerable pulmonary nodules consistent with metastatic involvement. 4. Diffuse lytic bone metastases in the thoracolumbar spine and bony pelvis. 5. Foley catheter decompresses the urinary bladder. Electronically Signed   By: Misty Stanley M.D.   On: 04/25/2017 11:20   Dg Chest Portable 1 View  Result Date: 04/25/2017 CLINICAL DATA:  Initial evaluation for acute weakness. History of breast cancer. EXAM: PORTABLE CHEST 1 VIEW COMPARISON:  Prior radiograph from 04/06/2017. FINDINGS: Right-sided Port-A-Cath in place, stable. Cardiac and mediastinal silhouettes are stable in size and contour, and remain within normal limits. Lungs are mildly hypoinflated. No focal infiltrates. No pulmonary edema or pleural effusion. No pneumothorax. No acute osseus abnormality. IMPRESSION: No active cardiopulmonary disease. Electronically Signed   By: Jeannine Boga M.D.   On: 04/25/2017 06:32   Dg Chest Port 1 View  Result Date: 04/06/2017 CLINICAL DATA:  Port catheter placement. EXAM: PORTABLE CHEST 1 VIEW COMPARISON:   MRI abdomen dated March 01, 2017. Chest x-ray dated January 24, 2016. FINDINGS: Right port catheter with the tip projecting over the proximal SVC. The cardiomediastinal silhouette is normal in size. Normal pulmonary vascularity. Increased reticulonodular interstitial lung markings in both lung bases. No pleural effusion or pneumothorax. No acute osseous abnormality. IMPRESSION: 1. Right port catheter with tip projecting over the proximal SVC. No pneumothorax. 2. Increased reticulonodular densities in both lower lobes, consistent with known pulmonary metastatic disease. Electronically Signed   By: Titus Dubin M.D.   On: 04/06/2017 09:26   Dg Fluoro Guide Cv Line-no Report  Result Date: 04/06/2017 Fluoroscopy was utilized by the requesting physician.  No radiographic interpretation.   Dg Hip Unilat With Pelvis 2-3 Views Left  Result Date: 04/27/2017 CLINICAL DATA:  Acute onset of severe left hip pain which began this morning. No known injury. Current history of stage IV breast cancer for which the patient receiving chemotherapy. EXAM: DG HIP (WITH OR WITHOUT PELVIS) 2-3V LEFT COMPARISON:  None. FINDINGS: No evidence of acute fracture or dislocation. Well preserved joint space. Well preserved bone mineral density. No intrinsic osseous abnormality. Included AP pelvis demonstrates a normal-appearing contralateral right hip. Sacroiliac joints and symphysis pubis intact. Visualized lower lumbar spine unremarkable. Opaque material is present external to the patient overlying the inner thigh. IMPRESSION: Normal examination. Electronically Signed   By: Evangeline Dakin M.D.   On: 04/27/2017 20:00       IMPRESSION:  The patient has known metastatic breast cancer with multiple areas of metastatic disease.  The patient's dominant complaint is pain today and this appears to be due to a metastasis at the L4 level on the left.  The patient does also have some significant involvement of the sacrum on the  right as well.  I discussed proceeding with palliative radiation treatment to these areas.  The patient and her husband are very interested in proceeding with this.  Given the patient's target, she would benefit from 3D conformal planning.  I discussed with the patient proceeding with planning early Monday morning with her first treatment on Monday as well.  Given the upcoming schedule for the week, I would anticipate beginning her treatment on Monday with a dose greater than 3 Gy, then backing off to a lower dose later in the week.  All of the patient's questions were answered.   PLAN: Simulation on Monday morning, 12/24 with her first treatment later in the day.    ________________________________  Jodelle Gross, MD, PhD   **Disclaimer: This note was dictated with voice recognition software. Similar sounding words can inadvertently be transcribed and this note may contain transcription errors which may not have been corrected upon publication of note.**

## 2017-04-29 NOTE — Progress Notes (Addendum)
Pt did not void in past 12hrs. Pt up to bedside commode and voided 650cc this am. Will monitor urinary output as requested by MD. SRP, RN

## 2017-04-29 NOTE — Progress Notes (Addendum)
[facility] PROGRESS NOTE    Progress Note  Lindsay [name]  ZOX:[id number] DOB: 1965-06-14 DOA: 04/25/2017 PCP: Bernerd Limbo, MD     Brief Narrative:   Lindsay [name] is an 51 y.o. female past medical history significant for triple negative stage IV breast, depression presents to the ED with inability to urinate started 4 days prior to admission, accompanied by lower abdominal discomfort, concentrated urine nonbloody.  Denies any nausea vomiting or abdominal pain  Assessment/Plan:   Acute  Urinary retention: CT scan of the abdomen and pelvis does not show any obstruction. Likely due to medications diphenhydramine, and probably narcotics, in the setting of electrolytes imbalance. Appreciate oncology's assistance.   Her Foley has been d/c and she has urinated.  Prolonged QT: Likely due to electrolyte derangements.  QTC continues to improve.    Hypocalcemia: Hypocalcemia cont to improve, continue oral supplementation recheck as an outpatient with oncologist, recheck a basic metabolic panel tomorrow.  Discontinue IV calcium supplementation. Likely due to to denosumab  Hypokalemia: Discontinue potassium supplementation, her potassium today is 5.3, recheck a basic metabolic panel in the morning.  Stage IV triple negative breast cancer/with new metastatic disease to the lumbar spine: X-ray of the left hip showed no fracture dislocation of metastatic disease. MRI of the lumbar spine performed on 04/28/2017 showed diffuse osseous metastatic disease, with a large implant at L4, with tumor extending into L3-L4 and the L4-L5 neural foramina, at this time she is only complaining of pain no focal deficits.  Oncology will consult radiation oncology for possible XRT to the left hip area, to be started on 05/01/2017. She relates her pain is improved.  Hypomagnasemia: Resolved.  Elevated LFTs: Improving likely due to nausea and vomiting. Abd US showed no biliary obst. or  dilaton.  Chemotherapy-induced anemia: Status post 1 unit of packed red blood cells no evidence of bleeding. She had a CBC that showed a hemoglobin of 5.7, I repeated H&H was 8.0, patient is asymptomatic repeat a CBC tomorrow morning.   DVT prophylaxis: lovenox Family Communication:none Disposition Plan/Barrier to D/C: Hopefully in 1 or 2 days.. Code Status:     Code Status Orders  (From admission, onward)        Start     Ordered   04/25/17 1205  Full code  Continuous     04/25/17 1204    Code Status History    Date Active Date Inactive Code Status Order ID Comments User Context   This patient has a current code status but no historical code status.        IV Access:    Peripheral IV   Procedures and diagnostic studies:   Mr Lumbar Spine Wo Contrast  Result Date: 04/29/2017 CLINICAL DATA:  Initial evaluation for back pain, buttock pain with weakness. History of metastatic breast cancer. EXAM: MRI LUMBAR SPINE WITHOUT CONTRAST TECHNIQUE: Multiplanar, multisequence MR imaging of the lumbar spine was performed. No intravenous contrast was administered. COMPARISON:  Prior CT from 04/25/2017. FINDINGS: Segmentation: Study moderately degraded by motion artifact. Additionally, patient was unable to tolerate the full length of the exam. No contrast was administered. Normal segmentation with the lowest well-formed disc labeled the L5-S1 level. Alignment: Normal alignment with preservation of the normal lumbar lordosis. No listhesis. Vertebrae: Bone marrow signal intensity is diffusely abnormal, likely related to diffuse osseous metastases. Vertebral body heights are relatively maintained without evidence for pathologic fracture. Large metastatic implant involving the left aspect of the L4 vertebral body with extension into  the left pedicle. Implant measures approximately 5.3 x 4.3 cm in greatest dimensions. Extra osseous extension with tumor in the adjacent paraspinous soft tissues.  Epidural tumor present within the ventral and left lateral epidural space at this level (series 7, image 24). Extension into the left L3-4 and L4-5 neural foramina. The Additional metastatic implant involving the right sacral ala demonstrates early invasion into the right S1 neural foramen (series 7, image 38). Small amount of epidural tumor just inferiorly as well (series 7, image 40). No other significant epidural tumor identified on this motion degraded exam. Conus medullaris and cauda equina: Conus extends to the L1 level. Conus and cauda equina appear normal. Paraspinal and other soft tissues: Soft tissue edema within the left paraspinous and psoas musculature adjacent to the left-sided metastasis at the L4 level. Left psoas muscle is displaced anteriorly and laterally. Abnormal edema within the left iliacus insula as well. Scattered left iliac nodes measure up to 2 cm in short axis, likely nodal metastases. Known hepatic metastases partially visualize. He scattered cystic lesions noted within the kidneys. Disc levels: L1-2:  Unremarkable. L2-3: Diffuse disc bulge. Mild facet hypertrophy. No significant canal stenosis. Mild bilateral L2 foraminal narrowing. L3-4: Diffuse disc bulge with disc desiccation. Tumor involves the left L3-4 neural foramen. Mild spinal stenosis. Moderate to severe left L3 foraminal narrowing due to metastasis. Right neural foramen patent. L4-5: Mild disc bulge. Tumor extends into the left L4-5 neural foramen. Epidural tumor within the ventral and lateral epidural space posterior to the L4 vertebral body. Secondary moderate spinal stenosis. Thecal sac measures 9 mm in AP diameter at its most narrow point. Moderate left L4 foraminal narrowing. No significant right foraminal encroachment. L5-S1: Diffuse disc bulge with disc desiccation. Epidural lipomatosis. No significant stenosis. IMPRESSION: 1. Limited exam due to patient's inability to tolerate the full length of the study.  Additionally, images provided are degraded by motion artifact. 2. Findings consistent with diffuse osseous metastases. 3. Large metastatic implant centered at the left aspect of the L4 with associated paraspinous and epidural extension as above. Tumor extends into the left L3-4 and L4-5 neural foramina. Associated moderate spinal stenosis at the level of L4. 4. Additional metastasis at the right sacral ala with extension into the right S1 neural foramen, likely affecting the transiting right S1 nerve. 5. No associated pathologic fracture at this time. 6. Hepatic and left iliac nodal metastases, partially visualized, better evaluated on recent CT. 7. Mild multilevel degenerative changes as above. Electronically Signed   By: Jeannine Boga M.D.   On: 04/29/2017 00:21   Dg Hip Unilat With Pelvis 2-3 Views Left  Result Date: 04/27/2017 CLINICAL DATA:  Acute onset of severe left hip pain which began this morning. No known injury. Current history of stage IV breast cancer for which the patient receiving chemotherapy. EXAM: DG HIP (WITH OR WITHOUT PELVIS) 2-3V LEFT COMPARISON:  None. FINDINGS: No evidence of acute fracture or dislocation. Well preserved joint space. Well preserved bone mineral density. No intrinsic osseous abnormality. Included AP pelvis demonstrates a normal-appearing contralateral right hip. Sacroiliac joints and symphysis pubis intact. Visualized lower lumbar spine unremarkable. Opaque material is present external to the patient overlying the inner thigh. IMPRESSION: Normal examination. Electronically Signed   By: Evangeline Dakin M.D.   On: 04/27/2017 20:00     Medical Consultants:    None.  Anti-Infectives:   None  Subjective:    Lindsay [name] patient was  better, she would like to take a bath.  Objective:    Vitals:   04/28/17 0923 04/28/17 1300 04/28/17 2135 04/29/17 0456  BP: 134/78 (!) 144/81 136/72 137/74  Pulse: (!) 112 (!) 109 (!) 114 (!) 111  Resp: 18 18 18  20   Temp: 98.1 F (36.7 C) 98.8 F (37.1 C) (!) 97.3 F (36.3 C) 98.7 F (37.1 C)  TempSrc: Oral Oral Oral Oral  SpO2: 98% 100% 98% 95%  Weight:      Height:        Intake/Output Summary (Last 24 hours) at 04/29/2017 1059 Last data filed at 04/29/2017 0803 Gross per 24 hour  Intake 2890 ml  Output 1100 ml  Net 1790 ml   Filed Weights   04/25/17 0527 04/25/17 1705  Weight: 77.1 kg (170 lb) 77.7 kg (171 lb 4.8 oz)    Exam: General exam: In no acute distress. Respiratory system: Good air movement and clear to auscultation. Cardiovascular system: S1 & S2 heard, RRR.  Gastrointestinal system: Abdomen is nondistended, soft and nontender.  Central nervous system: Alert and oriented. No focal neurological deficits. Extremities: No pedal edema. Skin: No rashes, lesions or ulcers Psychiatry: Judgement and insight appear normal. Mood & affect appropriate.    Data Reviewed:    Labs: Basic Metabolic Panel: Recent Labs  Lab 04/25/17 0557 04/25/17 1752 04/26/17 1021 04/27/17 0346 04/28/17 0342 04/29/17 0321  NA 136 138 139 140 141 141  K 2.3* 2.7* 3.0* 4.2 4.7 5.3*  CL 97* 105 105 111 114* 114*  CO2 22 23 24 23 23  21*  GLUCOSE 104* 104* 95 72 83 72  BUN 11 9 7  <5* 8 7  CREATININE 1.06* 0.70 0.58 0.58 0.49 0.50  CALCIUM 5.9* 5.3* 5.9* 5.9* 6.9* 7.5*  MG 1.5* 1.7  --  2.0  --   --    GFR Estimated Creatinine Clearance: 85.6 mL/min (by C-G formula based on SCr of 0.5 mg/dL). Liver Function Tests: Recent Labs  Lab 04/25/17 0557 04/25/17 1752 04/26/17 1021  AST 147* 117* 117*  ALT 85* 68* 65*  ALKPHOS 47 42 42  BILITOT 1.8* 1.6* 1.3*  PROT 7.1 6.1* 6.2*  ALBUMIN 3.8 3.2* 3.3*   No results for input(s): LIPASE, AMYLASE in the last 168 hours. No results for input(s): AMMONIA in the last 168 hours. Coagulation profile Recent Labs  Lab 04/25/17 0557  INR 1.24    CBC: Recent Labs  Lab 04/25/17 0557 04/27/17 0930 04/28/17 0342 04/29/17 0321 04/29/17 0810   WBC 9.5 7.7 6.4 7.4  --   NEUTROABS 6.4 5.7 5.9 5.8  --   HGB 6.4* 8.4* 7.9* 5.7* 8.0*  HCT 19.3* 25.3* 24.5* 17.3* 24.5*  MCV 83.9 86.1 87.2 87.4  --   PLT 316 149* 118* 134*  --    Cardiac Enzymes: Recent Labs  Lab 04/25/17 0557  CKTOTAL 557*   BNP (last 3 results) No results for input(s): PROBNP in the last 8760 hours. CBG: No results for input(s): GLUCAP in the last 168 hours. D-Dimer: No results for input(s): DDIMER in the last 72 hours. Hgb A1c: No results for input(s): HGBA1C in the last 72 hours. Lipid Profile: No results for input(s): CHOL, HDL, LDLCALC, TRIG, CHOLHDL, LDLDIRECT in the last 72 hours. Thyroid function studies: No results for input(s): TSH, T4TOTAL, T3FREE, THYROIDAB in the last 72 hours.  Invalid input(s): FREET3 Anemia work up: Recent Labs    04/27/17 0930  VITAMINB12 1,262*  FOLATE 11.1  FERRITIN 1,982*  RETICCTPCT 2.7   Sepsis Labs: Recent Labs  Lab 04/25/17 0557 04/25/17 0612 04/27/17 0930 04/28/17 0342 04/29/17 0321  WBC 9.5  --  7.7 6.4 7.4  LATICACIDVEN  --  1.99*  --   --   --    Microbiology Recent Results (from the past 240 hour(s))  Culture, blood (Routine x 2)     Status: None (Preliminary result)   Collection Time: 04/25/17  5:57 AM  Result Value Ref Range Status   Specimen Description BLOOD PORTA CATH  Final   Special Requests   Final    BOTTLES DRAWN AEROBIC AND ANAEROBIC Blood Culture adequate volume   Culture   Final    NO GROWTH 3 DAYS Performed at [facility], [address] [place], [place] [address]    Report Status PENDING  Incomplete  Urine C&S     Status: None   Collection Time: 04/25/17  8:37 AM  Result Value Ref Range Status   Specimen Description URINE, CLEAN CATCH  Final   Special Requests Immunocompromised  Final   Culture   Final    NO GROWTH Performed at [facility], [place] [address] [place],  [address]    Report Status 04/26/2017 FINAL  Final     Medications:   .  calcium carbonate (dosed in mg elemental calcium)  1,000 mg of elemental calcium Oral Once  . chlorhexidine  15 mL Mouth Rinse BID  . enoxaparin (LOVENOX) injection  40 mg Subcutaneous Q24H  . fentaNYL  25 mcg Transdermal Q72H  . mouth rinse  15 mL Mouth Rinse q12n4p  . prochlorperazine  10 mg Intravenous TID AC   Continuous Infusions: . 0.9 % NaCl with KCl 40 mEq / L 100 mL/hr (04/28/17 1720)    LOS: 4 days   [facility] Pager [phone]  *Please refer to [email or web address], password [email or web address] to get updated schedule on who will round on this patient, as hospitalists switch teams weekly. If 7PM-7AM, please contact night-coverage at [email or web address], password [email or web address] for any overnight needs.  04/29/2017, 10:59 AM

## 2017-04-30 LAB — CBC
HEMATOCRIT: 24.5 % — AB (ref 36.0–46.0)
Hemoglobin: 8 g/dL — ABNORMAL LOW (ref 12.0–15.0)
MCH: 28.5 pg (ref 26.0–34.0)
MCHC: 32.7 g/dL (ref 30.0–36.0)
MCV: 87.2 fL (ref 78.0–100.0)
PLATELETS: 105 10*3/uL — AB (ref 150–400)
RBC: 2.81 MIL/uL — AB (ref 3.87–5.11)
RDW: 17.3 % — AB (ref 11.5–15.5)
WBC: 5 10*3/uL (ref 4.0–10.5)

## 2017-04-30 LAB — CBC WITH DIFFERENTIAL/PLATELET
BASOS ABS: 0 10*3/uL (ref 0.0–0.1)
BASOS PCT: 0 %
EOS PCT: 0 %
Eosinophils Absolute: 0 10*3/uL (ref 0.0–0.7)
HCT: 24.5 % — ABNORMAL LOW (ref 36.0–46.0)
HEMOGLOBIN: 7.9 g/dL — AB (ref 12.0–15.0)
LYMPHS PCT: 13 %
Lymphs Abs: 0.7 10*3/uL (ref 0.7–4.0)
MCH: 28.3 pg (ref 26.0–34.0)
MCHC: 32.2 g/dL (ref 30.0–36.0)
MCV: 87.8 fL (ref 78.0–100.0)
MONOS PCT: 6 %
Monocytes Absolute: 0.3 10*3/uL (ref 0.1–1.0)
NEUTROS PCT: 81 %
Neutro Abs: 4.1 10*3/uL (ref 1.7–7.7)
PLATELETS: 99 10*3/uL — AB (ref 150–400)
RBC: 2.79 MIL/uL — AB (ref 3.87–5.11)
RDW: 17.6 % — ABNORMAL HIGH (ref 11.5–15.5)
WBC: 5.1 10*3/uL (ref 4.0–10.5)
nRBC: 5 /100 WBC — ABNORMAL HIGH

## 2017-04-30 LAB — BASIC METABOLIC PANEL
Anion gap: 9 (ref 5–15)
BUN: 7 mg/dL (ref 6–20)
CHLORIDE: 107 mmol/L (ref 101–111)
CO2: 21 mmol/L — ABNORMAL LOW (ref 22–32)
CREATININE: 0.5 mg/dL (ref 0.44–1.00)
Calcium: 7.7 mg/dL — ABNORMAL LOW (ref 8.9–10.3)
Glucose, Bld: 76 mg/dL (ref 65–99)
POTASSIUM: 4.4 mmol/L (ref 3.5–5.1)
SODIUM: 137 mmol/L (ref 135–145)

## 2017-04-30 LAB — LACTATE DEHYDROGENASE: LDH: >2700 U/L — ABNORMAL HIGH (ref 98–192)

## 2017-04-30 LAB — CULTURE, BLOOD (ROUTINE X 2)
Culture: NO GROWTH
Special Requests: ADEQUATE

## 2017-04-30 NOTE — Progress Notes (Signed)
Daily Progress Note   Patient Name: Lindsay [name]       Date: 04/30/2017 DOB: [birth year]  Age: 51 y.o. MRN#: [id number] Attending Physician: Robbie Lis, MD Primary Care Physician: Bernerd Limbo, MD Admit Date: 04/25/2017  Reason for Consultation/Follow-up: Pain control  Subjective: I met with Ms. Acri and her husband again this evening.  He reports that she is still having pain that is 5-6 out of 10.  Her ultimate goal would be to get her pain down to a 3.  She does appear more comfortable and is much more interactive on examination today.  Her husband reports that they had the opportunity to meet with Dr. Lisbeth Renshaw and discuss findings of MRI as well as plan for palliative radiation.  We reviewed plan for pain management as well as discussed again etiology of her pain and role of radiation therapy with palliative intent.  Length of Stay: 5  Current Medications: Scheduled Meds:  . calcium carbonate (dosed in mg elemental calcium)  1,000 mg of elemental calcium Oral Once  . chlorhexidine  15 mL Mouth Rinse BID  . enoxaparin (LOVENOX) injection  40 mg Subcutaneous Q24H  . fentaNYL  25 mcg Transdermal Q72H  . mouth rinse  15 mL Mouth Rinse q12n4p  . prochlorperazine  10 mg Intravenous TID AC    Continuous Infusions:   PRN Meds: ALPRAZolam, diphenhydrAMINE **OR** diphenhydrAMINE, fentaNYL (SUBLIMAZE) injection, HYDROmorphone, iopamidol, LORazepam, naloxone **AND** sodium chloride flush, ondansetron (ZOFRAN) IV, sodium chloride flush  Physical Exam         General: Sleepy, but alert, in no acute distress.  HEENT: No bruits, no goiter, no JVD Heart: Regular rate and rhythm. No murmur appreciated. Lungs: Good air movement, clear Abdomen: Soft, nontender, nondistended, positive  bowel sounds.  Ext: No significant edema Skin: Warm and dry Neuro: Grossly intact, nonfocal.   Vital Signs: BP (!) 141/90 (BP Location: Left Arm)   Pulse (!) 129   Temp 98.4 F (36.9 C) (Oral)   Resp 20   Ht 5' 6"  (1.676 m)   Wt 77.7 kg (171 lb 4.8 oz)   SpO2 96%   BMI 27.65 kg/m  SpO2: SpO2: 96 % O2 Device: O2 Device: Not Delivered O2 Flow Rate: O2 Flow Rate (L/min): 0 L/min  Intake/output summary:   Intake/Output Summary (Last 24  hours) at 04/30/2017 1130 Last data filed at 04/30/2017 1034 Gross per 24 hour  Intake 150 ml  Output 1000 ml  Net -850 ml   LBM: Last BM Date: 04/28/17 Baseline Weight: Weight: 77.1 kg (170 lb) Most recent weight: Weight: 77.7 kg (171 lb 4.8 oz)       Palliative Assessment/Data:    Flowsheet Rows     Most Recent Value  Intake Tab  Referral Department  Hospitalist  Unit at Time of Referral  Oncology Unit  Palliative Care Primary Diagnosis  Cancer  Date Notified  04/28/17  Palliative Care Type  New Palliative care  Reason for referral  Pain  Date of Admission  04/25/17  Date first seen by Palliative Care  04/28/17  # of days Palliative referral response time  0 Day(s)  # of days IP prior to Palliative referral  3  Clinical Assessment  Palliative Performance Scale Score  50%  Pain Max last 24 hours  10  Pain Min Last 24 hours  5  Psychosocial & Spiritual Assessment  Palliative Care Outcomes  Patient/Family meeting held?  No      Patient Active Problem List   Diagnosis Date Noted  . Urinary retention 04/25/2017  . Hypocalcemia 04/25/2017  . Hypokalemia 04/25/2017  . Hypomagnesemia 04/25/2017  . Goals of care, counseling/discussion 03/24/2017  . Bone metastases ([place]) 03/03/2017  . Liver metastases ([place]) 03/03/2017  . Lung metastases ([place]) 03/03/2017  . Breast cancer metastasized to bone ([place]) 03/02/2017  . Genetic testing 03/20/2016  . Family history of breast cancer in female 02/10/2016  . Malignant neoplasm of  upper-outer quadrant of right breast in female, estrogen receptor negative ([place]) 12/16/2015  . Hx of adenomatous colonic polyps 04/25/2014  . Neck pain 11/06/2012    Palliative Care Assessment & Plan   Patient Profile: 51 y.o. female  with past medical history of HTN, depression, and triple negative breast CA with mets to lung, bone, and liver admitted on 04/25/2017 with urinary retention.  She was found to have prolonged QT and multiple metabolic derangements.  Still with significant pain predominantly in left hip.  Palliative consulted for assistance with symptom management with pain.  Recommendations/Plan:  Pain: Pain is better controlled on current regimen.  Still not at goal but she and her husband understand that long-term treatment through radiation therapy is likely to benefit more with fewer side effects than continued up titration of opioids.  She does have backup IV fentanyl to use in addition to her current fentanyl patch of 25 mcg an hour as well as Dilaudid oral 2 mg every 2 hours as needed.  I discussed with her about other adjuvants and she reports being allergic to Neurontin.  Could consider steroids, however, will defer at this time as I am not sure if there would be any adverse interaction between this and therapy she is getting for her current cancer treatment.  Code Status:    Code Status Orders  (From admission, onward)        Start     Ordered   04/25/17 1205  Full code  Continuous     04/25/17 1204    Code Status History    Date Active Date Inactive Code Status Order ID Comments User Context   This patient has a current code status but no historical code status.       Prognosis:   Unable to determine  Discharge Planning:  To Be Determined  Care plan was discussed with patient  and husband  Thank you for allowing the Palliative Medicine Team to assist in the care of this patient.   Total Time 30 Prolonged Time Billed No      Greater than 50%  of  this time was spent counseling and coordinating care related to the above assessment and plan.  Micheline Rough, MD  Please contact Palliative Medicine Team phone at [phone] for questions and concerns.

## 2017-04-30 NOTE — Progress Notes (Signed)
Patient ID: Lindsay [name], female   DOB: 01-14-1966, 51 y.o.   MRN: [id number]  PROGRESS NOTE    Lindsay [name]  JSE:[id number] DOB: 1965-06-16 DOA: 04/25/2017  PCP: Bernerd Limbo, MD   Brief Narrative:  51 y.o. female past medical history significant for triple negative stage IV breast, depression presents to the ED with inability to urinate started 4 days prior to admission, accompanied by lower abdominal discomfort.   Assessment & Plan:  Acute  Urinary retention: - CT scan of the abdomen and pelvis does not show any obstruction. - Likely due to medications diphenhydramine, and probably narcotics, in the setting of electrolytes imbalance. - improved   Prolonged QT - Likely due to electrolyte derangements - Improved   Hypocalcemia - Likely due to to denosumab - Follow up Calcium level in am  Hypokalemia / Hypomagnesemia  - Follow up BMP and magnesium level in am  Stage IV triple negative breast cancer/with new metastatic disease to the lumbar spine: - X-ray of the left hip showed no fracture dislocation of metastatic disease. MRI of the lumbar spine performed on 04/28/2017 showed diffuse osseous metastatic disease, with a large implant at L4, with tumor extending into L3-L4 and the L4-L5 neural foramina, at this time she is only complaining of pain no focal deficits.  Oncology will consult radiation oncology for possible XRT to the left hip area, to be started on 05/01/2017. - Continue pain management efforts   Elevated LFTs - Due to osseous mets  Chemotherapy-induced anemia: - Status post 1 unit of packed red blood cells no evidence of bleeding. - Follow up CBC in am   DVT prophylaxis: Lovenox subQ Code Status: full code  Family Communication: husband at the bedside  Disposition Plan: not yet stable for discharge    Consultants:   None  Procedures:   None  Antimicrobials:   None   Subjective: Has nausea.  Objective: Vitals:   04/29/17 0456  04/29/17 1444 04/29/17 2128 04/30/17 0401  BP: 137/74 (!) 149/80 130/86 (!) 141/90  Pulse: (!) 111 (!) 128 (!) 121 (!) 129  Resp: 20 20 19 20   Temp: 98.7 F (37.1 C) 99.3 F (37.4 C) 98.7 F (37.1 C) 98.4 F (36.9 C)  TempSrc: Oral Oral Oral Oral  SpO2: 95% 95% 95% 96%  Weight:      Height:        Intake/Output Summary (Last 24 hours) at 04/30/2017 1357 Last data filed at 04/30/2017 1300 Gross per 24 hour  Intake 270 ml  Output 1000 ml  Net -730 ml   Filed Weights   04/25/17 0527 04/25/17 1705  Weight: 77.1 kg (170 lb) 77.7 kg (171 lb 4.8 oz)    Examination:  General exam: Appears calm and comfortable  Respiratory system: Clear to auscultation. Respiratory effort normal. Cardiovascular system: S1 & S2 heard, Rate controlled  Gastrointestinal system: Abdomen is nondistended, soft and nontender. No organomegaly or masses felt. Normal bowel sounds heard. Central nervous system: Alert and oriented. No focal neurological deficits. Extremities: Symmetric 5 x 5 power. Skin: No rashes, lesions or ulcers Psychiatry: Judgement and insight appear normal. Mood & affect appropriate.   Data Reviewed: I have personally reviewed following labs and imaging studies  CBC: Recent Labs  Lab 04/25/17 0557 04/27/17 0930 04/28/17 0342 04/29/17 0321 04/29/17 0810 04/30/17 0426  WBC 9.5 7.7 6.4 7.4  --  5.1  NEUTROABS 6.4 5.7 5.9 5.8  --  4.1  HGB 6.4* 8.4* 7.9* 5.7* 8.0* 7.9*  HCT 19.3* 25.3* 24.5* 17.3* 24.5* 24.5*  MCV 83.9 86.1 87.2 87.4  --  87.8  PLT 316 149* 118* 134*  --  99*   Basic Metabolic Panel: Recent Labs  Lab 04/25/17 0557 04/25/17 1752  04/27/17 0346 04/28/17 0342 04/29/17 0321 04/29/17 1130 04/30/17 0426  NA 136 138   < > 140 141 141 138 137  K 2.3* 2.7*   < > 4.2 4.7 5.3* 5.1 4.4  CL 97* 105   < > 111 114* 114* 110 107  CO2 22 23   < > 23 23 21* 21* 21*  GLUCOSE 104* 104*   < > 72 83 72 76 76  BUN 11 9   < > <5* 8 7 6 7   CREATININE 1.06* 0.70   < >  0.58 0.49 0.50 0.46 0.50  CALCIUM 5.9* 5.3*   < > 5.9* 6.9* 7.5* 7.5* 7.7*  MG 1.5* 1.7  --  2.0  --   --   --   --    < > = values in this interval not displayed.   GFR: Estimated Creatinine Clearance: 85.6 mL/min (by C-G formula based on SCr of 0.5 mg/dL). Liver Function Tests: Recent Labs  Lab 04/25/17 0557 04/25/17 1752 04/26/17 1021  AST 147* 117* 117*  ALT 85* 68* 65*  ALKPHOS 47 42 42  BILITOT 1.8* 1.6* 1.3*  PROT 7.1 6.1* 6.2*  ALBUMIN 3.8 3.2* 3.3*   No results for input(s): LIPASE, AMYLASE in the last 168 hours. No results for input(s): AMMONIA in the last 168 hours. Coagulation Profile: Recent Labs  Lab 04/25/17 0557  INR 1.24   Cardiac Enzymes: Recent Labs  Lab 04/25/17 0557  CKTOTAL 557*   BNP (last 3 results) No results for input(s): PROBNP in the last 8760 hours. HbA1C: No results for input(s): HGBA1C in the last 72 hours. CBG: No results for input(s): GLUCAP in the last 168 hours. Lipid Profile: No results for input(s): CHOL, HDL, LDLCALC, TRIG, CHOLHDL, LDLDIRECT in the last 72 hours. Thyroid Function Tests: No results for input(s): TSH, T4TOTAL, FREET4, T3FREE, THYROIDAB in the last 72 hours. Anemia Panel: No results for input(s): VITAMINB12, FOLATE, FERRITIN, TIBC, IRON, RETICCTPCT in the last 72 hours. Urine analysis:    Component Value Date/Time   COLORURINE YELLOW 04/25/2017 [place] 04/25/2017 0837   LABSPEC 1.008 04/25/2017 0837   PHURINE 6.0 04/25/2017 0837   GLUCOSEU NEGATIVE 04/25/2017 0837   HGBUR NEGATIVE 04/25/2017 0837   BILIRUBINUR NEGATIVE 04/25/2017 0837   KETONESUR 5 (A) 04/25/2017 0837   PROTEINUR NEGATIVE 04/25/2017 0837   UROBILINOGEN 1.0 03/03/2015 1240   NITRITE NEGATIVE 04/25/2017 0837   LEUKOCYTESUR NEGATIVE 04/25/2017 0837   Sepsis Labs: @LABRCNTIP (procalcitonin:4,lacticidven:4)   ) Recent Results (from the past 240 hour(s))  Culture, blood (Routine x 2)     Status: None (Preliminary  result)   Collection Time: 04/25/17  5:57 AM  Result Value Ref Range Status   Specimen Description BLOOD PORTA CATH  Final   Special Requests   Final    BOTTLES DRAWN AEROBIC AND ANAEROBIC Blood Culture adequate volume   Culture   Final    NO GROWTH 4 DAYS Performed at [facility], [place] [address] [place], [place] [address]    Report Status PENDING  Incomplete  Urine C&S     Status: None   Collection Time: 04/25/17  8:37 AM  Result Value Ref Range Status   Specimen Description URINE, CLEAN CATCH  Final   Special Requests Immunocompromised  Final   Culture   Final    NO GROWTH Performed at [facility], [place] [address], [place], [place] [address]    Report Status 04/26/2017 FINAL  Final      Radiology Studies: Mr Lumbar Spine Wo Contrast  Result Date: 04/29/2017 CLINICAL DATA:  Initial evaluation for back pain, buttock pain with weakness. History of metastatic breast cancer. EXAM: MRI LUMBAR SPINE WITHOUT CONTRAST TECHNIQUE: Multiplanar, multisequence MR imaging of the lumbar spine was performed. No intravenous contrast was administered. COMPARISON:  Prior CT from 04/25/2017. FINDINGS: Segmentation: Study moderately degraded by motion artifact. Additionally, patient was unable to tolerate the full length of the exam. No contrast was administered. Normal segmentation with the lowest well-formed disc labeled the L5-S1 level. Alignment: Normal alignment with preservation of the normal lumbar lordosis. No listhesis. Vertebrae: Bone marrow signal intensity is diffusely abnormal, likely related to diffuse osseous metastases. Vertebral body heights are relatively maintained without evidence for pathologic fracture. Large metastatic implant involving the left aspect of the L4 vertebral body with extension into the left pedicle. Implant measures approximately 5.3 x 4.3 cm in greatest dimensions. Extra osseous extension with tumor in the adjacent paraspinous soft tissues. Epidural tumor  present within the ventral and left lateral epidural space at this level (series 7, image 24). Extension into the left L3-4 and L4-5 neural foramina. The Additional metastatic implant involving the right sacral ala demonstrates early invasion into the right S1 neural foramen (series 7, image 38). Small amount of epidural tumor just inferiorly as well (series 7, image 40). No other significant epidural tumor identified on this motion degraded exam. Conus medullaris and cauda equina: Conus extends to the L1 level. Conus and cauda equina appear normal. Paraspinal and other soft tissues: Soft tissue edema within the left paraspinous and psoas musculature adjacent to the left-sided metastasis at the L4 level. Left psoas muscle is displaced anteriorly and laterally. Abnormal edema within the left iliacus insula as well. Scattered left iliac nodes measure up to 2 cm in short axis, likely nodal metastases. Known hepatic metastases partially visualize. He scattered cystic lesions noted within the kidneys. Disc levels: L1-2:  Unremarkable. L2-3: Diffuse disc bulge. Mild facet hypertrophy. No significant canal stenosis. Mild bilateral L2 foraminal narrowing. L3-4: Diffuse disc bulge with disc desiccation. Tumor involves the left L3-4 neural foramen. Mild spinal stenosis. Moderate to severe left L3 foraminal narrowing due to metastasis. Right neural foramen patent. L4-5: Mild disc bulge. Tumor extends into the left L4-5 neural foramen. Epidural tumor within the ventral and lateral epidural space posterior to the L4 vertebral body. Secondary moderate spinal stenosis. Thecal sac measures 9 mm in AP diameter at its most narrow point. Moderate left L4 foraminal narrowing. No significant right foraminal encroachment. L5-S1: Diffuse disc bulge with disc desiccation. Epidural lipomatosis. No significant stenosis. IMPRESSION: 1. Limited exam due to patient's inability to tolerate the full length of the study. Additionally, images  provided are degraded by motion artifact. 2. Findings consistent with diffuse osseous metastases. 3. Large metastatic implant centered at the left aspect of the L4 with associated paraspinous and epidural extension as above. Tumor extends into the left L3-4 and L4-5 neural foramina. Associated moderate spinal stenosis at the level of L4. 4. Additional metastasis at the right sacral ala with extension into the right S1 neural foramen, likely affecting the transiting right S1 nerve. 5. No associated pathologic fracture at this time. 6. Hepatic and left iliac nodal metastases,  partially visualized, better evaluated on recent CT. 7. Mild multilevel degenerative changes as above. Electronically Signed   By: Jeannine Boga M.D.   On: 04/29/2017 00:21   Dg Hip Unilat With Pelvis 2-3 Views Left  Result Date: 04/27/2017 CLINICAL DATA:  Acute onset of severe left hip pain which began this morning. No known injury. Current history of stage IV breast cancer for which the patient receiving chemotherapy. EXAM: DG HIP (WITH OR WITHOUT PELVIS) 2-3V LEFT COMPARISON:  None. FINDINGS: No evidence of acute fracture or dislocation. Well preserved joint space. Well preserved bone mineral density. No intrinsic osseous abnormality. Included AP pelvis demonstrates a normal-appearing contralateral right hip. Sacroiliac joints and symphysis pubis intact. Visualized lower lumbar spine unremarkable. Opaque material is present external to the patient overlying the inner thigh. IMPRESSION: Normal examination. Electronically Signed   By: Evangeline Dakin M.D.   On: 04/27/2017 20:00        Scheduled Meds: . calcium carbonate   1,000 mg of elemental calcium Oral Once  . enoxaparin   40 mg Subcutaneous Q24H  . fentaNYL  25 mcg Transdermal Q72H  . prochlorperazine  10 mg Intravenous TID AC   Continuous Infusions:   LOS: 5 days    Time spent: 25 minutes  Greater than 50% of the time spent on counseling and coordinating the  care.   Leisa Lenz, MD [facility] Pager [phone]  If 7PM-7AM, please contact night-coverage [email or web address] Password [email or web address] 04/30/2017, 1:57 PM

## 2017-04-30 NOTE — Progress Notes (Signed)
Daily Progress Note   Patient Name: Lindsay [name]       Date: 04/30/2017 DOB: [birth year]  Age: 51 y.o. MRN#: [id number] Attending Physician: Robbie Lis, MD Primary Care Physician: Bernerd Limbo, MD Admit Date: 04/25/2017  Reason for Consultation/Follow-up: Pain control  Subjective: I met with Lindsay [name] and her mother this afternoon.  Reports pain is better overall and she appears more comfortable in bed today.  Reviewed chart and she has used 7 doses of dilaudid 4 mg PO and 3 doses of fentanyl 71mg IV.  We reviewed plan for pain management as well as discussed again etiology of her pain and role of radiation therapy with palliative intent.  Length of Stay: 5  Current Medications: Scheduled Meds:  . calcium carbonate (dosed in mg elemental calcium)  1,000 mg of elemental calcium Oral Once  . chlorhexidine  15 mL Mouth Rinse BID  . enoxaparin (LOVENOX) injection  40 mg Subcutaneous Q24H  . fentaNYL  25 mcg Transdermal Q72H  . mouth rinse  15 mL Mouth Rinse q12n4p  . prochlorperazine  10 mg Intravenous TID AC    Continuous Infusions:   PRN Meds: ALPRAZolam, diphenhydrAMINE **OR** diphenhydrAMINE, fentaNYL (SUBLIMAZE) injection, HYDROmorphone, iopamidol, LORazepam, naloxone **AND** sodium chloride flush, ondansetron (ZOFRAN) IV, sodium chloride flush  Physical Exam         General: Sleepy, but alert, in no acute distress.  HEENT: No bruits, no goiter, no JVD Heart: Regular rate and rhythm. No murmur appreciated. Lungs: Good air movement, clear Abdomen: Soft, nontender, nondistended, positive bowel sounds.  Ext: No significant edema Skin: Warm and dry Neuro: Grossly intact, nonfocal.   Vital Signs: BP (!) 141/90 (BP Location: Left Arm)   Pulse (!) 129   Temp 98.4  F (36.9 C) (Oral)   Resp 20   Ht '5\' 6"'$  (1.676 m)   Wt 77.7 kg (171 lb 4.8 oz)   SpO2 96%   BMI 27.65 kg/m  SpO2: SpO2: 96 % O2 Device: O2 Device: Not Delivered O2 Flow Rate: O2 Flow Rate (L/min): 0 L/min  Intake/output summary:   Intake/Output Summary (Last 24 hours) at 04/30/2017 1524 Last data filed at 04/30/2017 1300 Gross per 24 hour  Intake 270 ml  Output 1000 ml  Net -730 ml   LBM: Last BM Date: 04/28/17 Baseline Weight:  Weight: 77.1 kg (170 lb) Most recent weight: Weight: 77.7 kg (171 lb 4.8 oz)       Palliative Assessment/Data:    Flowsheet Rows     Most Recent Value  Intake Tab  Referral Department  Hospitalist  Unit at Time of Referral  Oncology Unit  Palliative Care Primary Diagnosis  Cancer  Date Notified  04/28/17  Palliative Care Type  New Palliative care  Reason for referral  Pain  Date of Admission  04/25/17  Date first seen by Palliative Care  04/28/17  # of days Palliative referral response time  0 Day(s)  # of days IP prior to Palliative referral  3  Clinical Assessment  Palliative Performance Scale Score  50%  Pain Max last 24 hours  10  Pain Min Last 24 hours  5  Psychosocial & Spiritual Assessment  Palliative Care Outcomes  Patient/Family meeting held?  No      Patient Active Problem List   Diagnosis Date Noted  . Urinary retention 04/25/2017  . Hypocalcemia 04/25/2017  . Hypokalemia 04/25/2017  . Hypomagnesemia 04/25/2017  . Goals of care, counseling/discussion 03/24/2017  . Bone metastases ([place]) 03/03/2017  . Liver metastases ([place]) 03/03/2017  . Lung metastases ([place]) 03/03/2017  . Breast cancer metastasized to bone ([place]) 03/02/2017  . Genetic testing 03/20/2016  . Family history of breast cancer in female 02/10/2016  . Malignant neoplasm of upper-outer quadrant of right breast in female, estrogen receptor negative ([place]) 12/16/2015  . Hx of adenomatous colonic polyps 04/25/2014  . Neck pain 11/06/2012    Palliative Care  Assessment & Plan   Patient Profile: 51 y.o. female  with past medical history of HTN, depression, and triple negative breast CA with mets to lung, bone, and liver admitted on 04/25/2017 with urinary retention.  She was found to have prolonged QT and multiple metabolic derangements.  Still with significant pain predominantly in left hip.  Palliative consulted for assistance with symptom management with pain.  Recommendations/Plan:  Pain: Pain is better controlled on current regimen.  Still not at goal but she understands that long-term treatment through radiation therapy is likely to benefit more with fewer side effects than continued up titration of opioids.  Continue same.  I discussed with her about other adjuvants and she reports being allergic to Neurontin.  Could consider steroids, however, will defer at this time as I am not sure if there would be any adverse interaction between this and therapy she is getting for her current cancer treatment.  Code Status:    Code Status Orders  (From admission, onward)        Start     Ordered   04/25/17 1205  Full code  Continuous     04/25/17 1204    Code Status History    Date Active Date Inactive Code Status Order ID Comments User Context   This patient has a current code status but no historical code status.       Prognosis:   Unable to determine  Discharge Planning:  To Be Determined  Care plan was discussed with patient and husband  Thank you for allowing the Palliative Medicine Team to assist in the care of this patient.   Total Time 20 Prolonged Time Billed No      Greater than 50%  of this time was spent counseling and coordinating care related to the above assessment and plan.  Micheline Rough, MD  Please contact Palliative Medicine Team phone at [phone] for questions  and concerns.

## 2017-05-01 ENCOUNTER — Ambulatory Visit
Admit: 2017-05-01 | Discharge: 2017-05-01 | Disposition: A | Discharge status: Home / Self Care | Payer: BLUE CROSS/BLUE SHIELD | Attending: Radiation Oncology | Admitting: Radiation Oncology

## 2017-05-01 ENCOUNTER — Telehealth: Payer: Self-pay | Admitting: *Deleted

## 2017-05-01 ENCOUNTER — Encounter: Payer: Self-pay | Admitting: Radiation Oncology

## 2017-05-01 ENCOUNTER — Encounter: Payer: Self-pay | Admitting: *Deleted

## 2017-05-01 ENCOUNTER — Telehealth: Payer: Self-pay | Admitting: Oncology

## 2017-05-01 DIAGNOSIS — C7951 Secondary malignant neoplasm of bone: Secondary | ICD-10-CM

## 2017-05-01 DIAGNOSIS — C50919 Malignant neoplasm of unspecified site of unspecified female breast: Secondary | ICD-10-CM

## 2017-05-01 DIAGNOSIS — K5903 Drug induced constipation: Secondary | ICD-10-CM

## 2017-05-01 DIAGNOSIS — T402X5A Adverse effect of other opioids, initial encounter: Secondary | ICD-10-CM

## 2017-05-01 LAB — BASIC METABOLIC PANEL
ANION GAP: 11 (ref 5–15)
BUN: 6 mg/dL (ref 6–20)
CALCIUM: 7.5 mg/dL — AB (ref 8.9–10.3)
CHLORIDE: 104 mmol/L (ref 101–111)
CO2: 20 mmol/L — ABNORMAL LOW (ref 22–32)
CREATININE: 0.45 mg/dL (ref 0.44–1.00)
GFR calc non Af Amer: >60 mL/min (ref >60)
Glucose, Bld: 83 mg/dL (ref 65–99)
Potassium: 3.8 mmol/L (ref 3.5–5.1)
SODIUM: 135 mmol/L (ref 135–145)

## 2017-05-01 MED ORDER — SENNA 8.6 MG PO TABS
2.0000 | ORAL_TABLET | Freq: Two times a day (BID) | ORAL | Status: DC
  Administered 2017-05-01 – 2017-05-03 (×4): 17.2 mg via ORAL
  Filled 2017-05-01 (×4): qty 2

## 2017-05-01 MED ORDER — LORAZEPAM 0.5 MG PO TABS
0.5000 mg | ORAL_TABLET | Freq: Once | ORAL | Status: AC | PRN
  Administered 2017-05-01: 0.5 mg via ORAL

## 2017-05-01 MED ORDER — LORAZEPAM 0.5 MG PO TABS
0.5000 mg | ORAL_TABLET | Freq: Once | ORAL | Status: DC
  Filled 2017-05-01: qty 1

## 2017-05-01 MED ORDER — HYDROMORPHONE HCL 1 MG/ML IJ SOLN
1.0000 mg | Freq: Once | INTRAMUSCULAR | Status: AC
  Administered 2017-05-01: 1 mg via INTRAVENOUS
  Filled 2017-05-01: qty 1

## 2017-05-01 MED ORDER — HYDROMORPHONE HCL 1 MG/ML IJ SOLN: 1.0000 mg | Freq: Once | INTRAMUSCULAR | Status: DC | PRN

## 2017-05-01 MED ORDER — SODIUM CHLORIDE 0.9% FLUSH
10.0000 mL | Freq: Once | INTRAVENOUS | Status: AC
  Administered 2017-05-01: 10 mL via INTRAVENOUS

## 2017-05-01 MED ORDER — POLYETHYLENE GLYCOL 3350 17 G PO PACK: 17.0000 g | PACK | Freq: Every day | ORAL | Status: DC | PRN

## 2017-05-01 NOTE — Progress Notes (Signed)
Patient had difficulty urinating  tonight, bladder scan showed 850 ml, previously patient was able to urinate 750 ml around 1300. In and out intermittent empty bladder at 2300 and obtain 800 ml clear amber color urine.  Post-residue scan was 0 ml.  Patient felt much relief.

## 2017-05-01 NOTE — Telephone Encounter (Signed)
Called and spoke with RN Stanton Kidney, patient plan not ready as yet, we will call right before Nicki Reaper comes to get the patient, per [facility] she will give ativan when the call comes through and then when Nicki Reaper comes to get patient, she will give the Dilaudid Iv then, thanked Therapist, sports, informed L1 RT therapists Raquel Sarna, and Scott,trasnporter 1:38 PM

## 2017-05-01 NOTE — Telephone Encounter (Signed)
Called and spoke with Stanton Kidney RN, and then Dr. Domingo Cocking of patient pain level, he will order ativan 0.5mg  po and 1mg  IV Dilaudid before coming to rad/onc, can give another dose of both if needed 30 minutes later here in rad onc, thanked Dr. Domingo Cocking, called and spoke with Raquel Sarna RT of status 11:09 AM

## 2017-05-01 NOTE — Progress Notes (Addendum)
Patient ID: Lindsay [name], female   DOB: 1965/09/07, 51 y.o.   MRN: [id number]  PROGRESS NOTE    Lindsay [name]  YQM:[id number] DOB: 1966/04/23 DOA: 04/25/2017  PCP: Lindsay Limbo, MD   Brief Narrative:  51 y.o. female past medical history significant for triple negative stage IV breast, depression presents to the ED with inability to urinate started 4 days prior to admission, accompanied by lower abdominal discomfort.   Assessment & Plan:  Acute  Urinary retention - CT scan of the abdomen and pelvis does not show any obstruction. - Likely due to medications diphenhydramine, and probably narcotics, in the setting of electrolytes imbalance. - Still with trouble urinating so did in and out cath last night  Prolonged QT - Likely due to electrolyte derangements - Improved   Hypocalcemia - Likely due to to denosumab - Calcium 7.5 this am  Hypokalemia / Hypomagnesemia  - Supplemented   Stage IV triple negative breast cancer/with new metastatic disease to the lumbar spine: - X-ray of the left hip showed no fracture dislocation of metastatic disease. MRI of the lumbar spine performed on 04/28/2017 showed diffuse osseous metastatic disease, with a large implant at L4, with tumor extending into L3-L4 and the L4-L5 neural foramina, at this time she is only complaining of pain no focal deficits.  Oncology will consult radiation oncology for possible XRT to the left hip area, to be started on 05/01/2017. - Continue pain management efforts   Elevated LFTs - Due to osseous mets  Chemotherapy-induced anemia: - Status post 1 unit of packed red blood cells no evidence of bleeding. - Hgb 8 on 12/23   DVT prophylaxis: Lovenox subQ Code Status: full code  Family Communication: husband at bedside  Disposition Plan: not yet stable for discharge    Consultants:   None  Procedures:   None  Antimicrobials:   None   Subjective: Had difficulty urinating last evening.    Objective: Vitals:   04/30/17 0401 04/30/17 1804 04/30/17 2058 05/01/17 0436  BP: (!) 141/90 (!) 147/84 (!) 146/84 132/80  Pulse: (!) 129 (!) 115 (!) 128 (!) 132  Resp: 20 20 20  (!) 21  Temp: 98.4 F (36.9 C) 98.9 F (37.2 C) 99.5 F (37.5 C) 99.2 F (37.3 C)  TempSrc: Oral Oral Oral Oral  SpO2: 96% 96% 96% 100%  Weight:      Height:        Intake/Output Summary (Last 24 hours) at 05/01/2017 0742 Last data filed at 04/30/2017 2300 Gross per 24 hour  Intake 360 ml  Output 1500 ml  Net -1140 ml   Filed Weights   04/25/17 0527 04/25/17 1705  Weight: 77.1 kg (170 lb) 77.7 kg (171 lb 4.8 oz)    Physical Exam  Constitutional: Appears well-developed and well-nourished. No distress.  CVS: RRR, S1/S2 + Pulmonary: No stridor, rhonchi, wheezes, rales.  Abdominal: Soft. BS +,  no distension, tenderness, rebound or guarding.  Musculoskeletal: Normal range of motion. No edema and no tenderness.  Neuro: Alert. No cranial nerve deficit. Skin: Skin is warm and dry.  Psychiatric: Normal mood and affect.     Data Reviewed: I have personally reviewed following labs and imaging studies  CBC: Recent Labs  Lab 04/25/17 0557 04/27/17 0930 04/28/17 0342 04/29/17 0321 04/29/17 0810 04/30/17 0426 04/30/17 1540  WBC 9.5 7.7 6.4 7.4  --  5.1 5.0  NEUTROABS 6.4 5.7 5.9 5.8  --  4.1  --   HGB 6.4* 8.4* 7.9* 5.7* 8.0*  7.9* 8.0*  HCT 19.3* 25.3* 24.5* 17.3* 24.5* 24.5* 24.5*  MCV 83.9 86.1 87.2 87.4  --  87.8 87.2  PLT 316 149* 118* 134*  --  99* 585*   Basic Metabolic Panel: Recent Labs  Lab 04/25/17 0557 04/25/17 1752  04/27/17 0346 04/28/17 0342 04/29/17 0321 04/29/17 1130 04/30/17 0426 05/01/17 0544  NA 136 138   < > 140 141 141 138 137 135  K 2.3* 2.7*   < > 4.2 4.7 5.3* 5.1 4.4 3.8  CL 97* 105   < > 111 114* 114* 110 107 104  CO2 22 23   < > 23 23 21* 21* 21* 20*  GLUCOSE 104* 104*   < > 72 83 72 76 76 83  BUN 11 9   < > <5* 8 7 6 7 6   CREATININE 1.06* 0.70   < >  0.58 0.49 0.50 0.46 0.50 0.45  CALCIUM 5.9* 5.3*   < > 5.9* 6.9* 7.5* 7.5* 7.7* 7.5*  MG 1.5* 1.7  --  2.0  --   --   --   --   --    < > = values in this interval not displayed.   GFR: Estimated Creatinine Clearance: 85.6 mL/min (by C-G formula based on SCr of 0.45 mg/dL). Liver Function Tests: Recent Labs  Lab 04/25/17 0557 04/25/17 1752 04/26/17 1021  AST 147* 117* 117*  ALT 85* 68* 65*  ALKPHOS 47 42 42  BILITOT 1.8* 1.6* 1.3*  PROT 7.1 6.1* 6.2*  ALBUMIN 3.8 3.2* 3.3*   No results for input(s): LIPASE, AMYLASE in the last 168 hours. No results for input(s): AMMONIA in the last 168 hours. Coagulation Profile: Recent Labs  Lab 04/25/17 0557  INR 1.24   Cardiac Enzymes: Recent Labs  Lab 04/25/17 0557  CKTOTAL 557*   BNP (last 3 results) No results for input(s): PROBNP in the last 8760 hours. HbA1C: No results for input(s): HGBA1C in the last 72 hours. CBG: No results for input(s): GLUCAP in the last 168 hours. Lipid Profile: No results for input(s): CHOL, HDL, LDLCALC, TRIG, CHOLHDL, LDLDIRECT in the last 72 hours. Thyroid Function Tests: No results for input(s): TSH, T4TOTAL, FREET4, T3FREE, THYROIDAB in the last 72 hours. Anemia Panel: No results for input(s): VITAMINB12, FOLATE, FERRITIN, TIBC, IRON, RETICCTPCT in the last 72 hours. Urine analysis:    Component Value Date/Time   COLORURINE YELLOW 04/25/2017 [place] 04/25/2017 0837   LABSPEC 1.008 04/25/2017 0837   PHURINE 6.0 04/25/2017 0837   GLUCOSEU NEGATIVE 04/25/2017 0837   HGBUR NEGATIVE 04/25/2017 0837   BILIRUBINUR NEGATIVE 04/25/2017 0837   KETONESUR 5 (A) 04/25/2017 0837   PROTEINUR NEGATIVE 04/25/2017 0837   UROBILINOGEN 1.0 03/03/2015 1240   NITRITE NEGATIVE 04/25/2017 0837   LEUKOCYTESUR NEGATIVE 04/25/2017 0837   Sepsis Labs: @LABRCNTIP (procalcitonin:4,lacticidven:4)   Recent Results (from the past 240 hour(s))  Culture, blood (Routine x 2)     Status: None    Collection Time: 04/25/17  5:57 AM  Result Value Ref Range Status   Specimen Description BLOOD PORTA CATH  Final   Special Requests   Final    BOTTLES DRAWN AEROBIC AND ANAEROBIC Blood Culture adequate volume   Culture   Final    NO GROWTH 5 DAYS Performed at [facility], [place] [address], [place], [place] [address]    Report Status 04/30/2017 FINAL  Final  Urine C&S     Status: None   Collection Time:  04/25/17  8:37 AM  Result Value Ref Range Status   Specimen Description URINE, CLEAN CATCH  Final   Special Requests Immunocompromised  Final   Culture   Final    NO GROWTH Performed at [facility], [address] [place], [place] [address]    Report Status 04/26/2017 FINAL  Final      Radiology Studies: Mr Lumbar Spine Wo Contrast  Result Date: 04/29/2017 CLINICAL DATA:  Initial evaluation for back pain, buttock pain with weakness. History of metastatic breast cancer. EXAM: MRI LUMBAR SPINE WITHOUT CONTRAST TECHNIQUE: Multiplanar, multisequence MR imaging of the lumbar spine was performed. No intravenous contrast was administered. COMPARISON:  Prior CT from 04/25/2017. FINDINGS: Segmentation: Study moderately degraded by motion artifact. Additionally, patient was unable to tolerate the full length of the exam. No contrast was administered. Normal segmentation with the lowest well-formed disc labeled the L5-S1 level. Alignment: Normal alignment with preservation of the normal lumbar lordosis. No listhesis. Vertebrae: Bone marrow signal intensity is diffusely abnormal, likely related to diffuse osseous metastases. Vertebral body heights are relatively maintained without evidence for pathologic fracture. Large metastatic implant involving the left aspect of the L4 vertebral body with extension into the left pedicle. Implant measures approximately 5.3 x 4.3 cm in greatest dimensions. Extra osseous extension with tumor in the adjacent paraspinous soft tissues. Epidural tumor present  within the ventral and left lateral epidural space at this level (series 7, image 24). Extension into the left L3-4 and L4-5 neural foramina. The Additional metastatic implant involving the right sacral ala demonstrates early invasion into the right S1 neural foramen (series 7, image 38). Small amount of epidural tumor just inferiorly as well (series 7, image 40). No other significant epidural tumor identified on this motion degraded exam. Conus medullaris and cauda equina: Conus extends to the L1 level. Conus and cauda equina appear normal. Paraspinal and other soft tissues: Soft tissue edema within the left paraspinous and psoas musculature adjacent to the left-sided metastasis at the L4 level. Left psoas muscle is displaced anteriorly and laterally. Abnormal edema within the left iliacus insula as well. Scattered left iliac nodes measure up to 2 cm in short axis, likely nodal metastases. Known hepatic metastases partially visualize. He scattered cystic lesions noted within the kidneys. Disc levels: L1-2:  Unremarkable. L2-3: Diffuse disc bulge. Mild facet hypertrophy. No significant canal stenosis. Mild bilateral L2 foraminal narrowing. L3-4: Diffuse disc bulge with disc desiccation. Tumor involves the left L3-4 neural foramen. Mild spinal stenosis. Moderate to severe left L3 foraminal narrowing due to metastasis. Right neural foramen patent. L4-5: Mild disc bulge. Tumor extends into the left L4-5 neural foramen. Epidural tumor within the ventral and lateral epidural space posterior to the L4 vertebral body. Secondary moderate spinal stenosis. Thecal sac measures 9 mm in AP diameter at its most narrow point. Moderate left L4 foraminal narrowing. No significant right foraminal encroachment. L5-S1: Diffuse disc bulge with disc desiccation. Epidural lipomatosis. No significant stenosis. IMPRESSION: 1. Limited exam due to patient's inability to tolerate the full length of the study. Additionally, images provided are  degraded by motion artifact. 2. Findings consistent with diffuse osseous metastases. 3. Large metastatic implant centered at the left aspect of the L4 with associated paraspinous and epidural extension as above. Tumor extends into the left L3-4 and L4-5 neural foramina. Associated moderate spinal stenosis at the level of L4. 4. Additional metastasis at the right sacral ala with extension into the right S1 neural foramen, likely affecting the transiting  right S1 nerve. 5. No associated pathologic fracture at this time. 6. Hepatic and left iliac nodal metastases, partially visualized, better evaluated on recent CT. 7. Mild multilevel degenerative changes as above. Electronically Signed   By: Jeannine Boga M.D.   On: 04/29/2017 00:21   Dg Hip Unilat With Pelvis 2-3 Views Left  Result Date: 04/27/2017 CLINICAL DATA:  Acute onset of severe left hip pain which began this morning. No known injury. Current history of stage IV breast cancer for which the patient receiving chemotherapy. EXAM: DG HIP (WITH OR WITHOUT PELVIS) 2-3V LEFT COMPARISON:  None. FINDINGS: No evidence of acute fracture or dislocation. Well preserved joint space. Well preserved bone mineral density. No intrinsic osseous abnormality. Included AP pelvis demonstrates a normal-appearing contralateral right hip. Sacroiliac joints and symphysis pubis intact. Visualized lower lumbar spine unremarkable. Opaque material is present external to the patient overlying the inner thigh. IMPRESSION: Normal examination. Electronically Signed   By: Evangeline Dakin M.D.   On: 04/27/2017 20:00        Scheduled Meds: . calcium carbonate   1,000 mg of elemental calcium Oral Once  . enoxaparin   40 mg Subcutaneous Q24H  . fentaNYL  25 mcg Transdermal Q72H  . prochlorperazine  10 mg Intravenous TID AC   Continuous Infusions:   LOS: 6 days    Time spent: 25 minutes  Greater than 50% of the time spent on counseling and coordinating the  care.   Leisa Lenz, MD [facility] Pager [phone]  If 7PM-7AM, please contact night-coverage [email or web address] Password [facility] 05/01/2017, 7:42 AM

## 2017-05-01 NOTE — Plan of Care (Signed)
  Progressing Health Behavior/Discharge Planning: Ability to manage health-related needs will improve 05/01/2017 2231 - Progressing by Talbert Forest, RN Clinical Measurements: Ability to maintain clinical measurements within normal limits will improve 05/01/2017 2231 - Progressing by Talbert Forest, RN Will remain free from infection 05/01/2017 2231 - Progressing by Talbert Forest, RN Coping: Level of anxiety will decrease 05/01/2017 2231 - Progressing by Talbert Forest, RN Safety: Ability to remain free from injury will improve 05/01/2017 2231 - Progressing by Talbert Forest, RN

## 2017-05-01 NOTE — Plan of Care (Signed)
    Not Progressing Elimination: Will not experience complications related to urinary retention 05/01/2017 0935 - Not Progressing by Trayce Maino, Royetta Crochet, RN Note Had to have straight cath last night with 812ml output.  Not able to urinate yet today.  If no void by early afternoon, will scan and if >500 order per MD to place indwelling foley.     Progressing Health Behavior/Discharge Planning: Ability to manage health-related needs will improve 05/01/2017 0935 - Progressing by Loveta Dellis, Royetta Crochet, RN Clinical Measurements: Ability to maintain clinical measurements within normal limits will improve 05/01/2017 0935 - Progressing by Melba Araki, Royetta Crochet, RN Will remain free from infection 05/01/2017 0935 - Progressing by Bennett Ram, Royetta Crochet, RN Diagnostic test results will improve 05/01/2017 0935 - Progressing by Devynn Scheff, Royetta Crochet, RN Activity: Risk for activity intolerance will decrease 05/01/2017 0935 - Progressing by Jaquanna Ballentine, Royetta Crochet, RN Coping: Level of anxiety will decrease 05/01/2017 0935 - Progressing by Aradhana Gin, Royetta Crochet, RN Elimination: Will not experience complications related to bowel motility 05/01/2017 0935 - Progressing by Arbadella Kimbler, Royetta Crochet, RN Pain Managment: General experience of comfort will improve 05/01/2017 0935 - Progressing by Graves Nipp, Royetta Crochet, RN Safety: Ability to remain free from injury will improve 05/01/2017 0935 - Progressing by Lanaysia Fritchman, Royetta Crochet, RN

## 2017-05-01 NOTE — Telephone Encounter (Signed)
Called [phone] spoke with RN Stanton Kidney, patient to come for CT simulation on her spine/sacral area will be lying flat for about 1 hour, please medicate patient for pain  Before transferred via bed to Rad/Onc dept at [facility], patient has KVO IVF'S infusing, thanked Therapist, sports for update 7:56 AM

## 2017-05-01 NOTE — Progress Notes (Signed)
Daily Progress Note   Patient Name: Lindsay [name]       Date: 05/01/2017 DOB: [birth year]  Age: 51 y.o. MRN#: [id number] Attending Physician: Robbie Lis, MD Primary Care Physician: Bernerd Limbo, MD Admit Date: 04/25/2017  Reason for Consultation/Follow-up: Pain control  Subjective: I met with Ms. Villanueva and her sister this morning.  Reports pain is better overall, but she just got up from radiation and pain was unbearable during time on the table.  We reviewed plan for pain management as well as discussed again etiology of her pain and role of radiation therapy with palliative intent.  Length of Stay: 6  Current Medications: Scheduled Meds:  . calcium carbonate (dosed in mg elemental calcium)  1,000 mg of elemental calcium Oral Once  . chlorhexidine  15 mL Mouth Rinse BID  . enoxaparin (LOVENOX) injection  40 mg Subcutaneous Q24H  . fentaNYL  25 mcg Transdermal Q72H  .  HYDROmorphone (DILAUDID) injection  1 mg Intravenous Once  . LORazepam  0.5 mg Oral Once  . mouth rinse  15 mL Mouth Rinse q12n4p  . prochlorperazine  10 mg Intravenous TID AC    Continuous Infusions:   PRN Meds: ALPRAZolam, diphenhydrAMINE **OR** diphenhydrAMINE, fentaNYL (SUBLIMAZE) injection, HYDROmorphone (DILAUDID) injection, HYDROmorphone, iopamidol, LORazepam, LORazepam, naloxone **AND** sodium chloride flush, ondansetron (ZOFRAN) IV, sodium chloride flush  Physical Exam         General: Sleepy, but alert, in no acute distress.  HEENT: No bruits, no goiter, no JVD Heart: Regular rate and rhythm. No murmur appreciated. Lungs: Good air movement, clear Abdomen: Soft, nontender, nondistended, positive bowel sounds.  Ext: No significant edema Skin: Warm and dry Neuro: Grossly intact,  nonfocal.   Vital Signs: BP 132/80 (BP Location: Left Arm)   Pulse (!) 132   Temp 99.2 F (37.3 C) (Oral)   Resp (!) 21   Ht 5' 6"  (1.676 m)   Wt 77.7 kg (171 lb 4.8 oz)   SpO2 100%   BMI 27.65 kg/m  SpO2: SpO2: 100 % O2 Device: O2 Device: Not Delivered O2 Flow Rate: O2 Flow Rate (L/min): 0 L/min  Intake/output summary:   Intake/Output Summary (Last 24 hours) at 05/01/2017 1234 Last data filed at 04/30/2017 2300 Gross per 24 hour  Intake 240 ml  Output 1500 ml  Net -1260  ml   LBM: Last BM Date: 04/28/17 Baseline Weight: Weight: 77.1 kg (170 lb) Most recent weight: Weight: 77.7 kg (171 lb 4.8 oz)       Palliative Assessment/Data:    Flowsheet Rows     Most Recent Value  Intake Tab  Referral Department  Hospitalist  Unit at Time of Referral  Oncology Unit  Palliative Care Primary Diagnosis  Cancer  Date Notified  04/28/17  Palliative Care Type  New Palliative care  Reason for referral  Pain  Date of Admission  04/25/17  Date first seen by Palliative Care  04/28/17  # of days Palliative referral response time  0 Day(s)  # of days IP prior to Palliative referral  3  Clinical Assessment  Palliative Performance Scale Score  50%  Pain Max last 24 hours  10  Pain Min Last 24 hours  5  Psychosocial & Spiritual Assessment  Palliative Care Outcomes  Patient/Family meeting held?  No      Patient Active Problem List   Diagnosis Date Noted  . Urinary retention 04/25/2017  . Hypocalcemia 04/25/2017  . Hypokalemia 04/25/2017  . Hypomagnesemia 04/25/2017  . Goals of care, counseling/discussion 03/24/2017  . Bone metastases ([place]) 03/03/2017  . Liver metastases ([place]) 03/03/2017  . Lung metastases ([place]) 03/03/2017  . Breast cancer metastasized to bone ([place]) 03/02/2017  . Genetic testing 03/20/2016  . Family history of breast cancer in female 02/10/2016  . Malignant neoplasm of upper-outer quadrant of right breast in female, estrogen receptor negative ([place])  12/16/2015  . Hx of adenomatous colonic polyps 04/25/2014  . Neck pain 11/06/2012    Palliative Care Assessment & Plan   Patient Profile: 51 y.o. female  with past medical history of HTN, depression, and triple negative breast CA with mets to lung, bone, and liver admitted on 04/25/2017 with urinary retention.  She was found to have prolonged QT and multiple metabolic derangements.  Still with significant pain predominantly in left hip.  Palliative consulted for assistance with symptom management with pain.  Recommendations/Plan:  Pain: Overall, pain is better controlled on current regimen.  Unfortunately, she had significant pain when trying to lie flat on table for radiation.  I spoke with radiation oncology nurse to discuss strategy for helping her get through radiation treatments.  We will continue with current regimen of fentanyl patch with oral Dilaudid for breakthrough and fentanyl IV for backup for overall pain management.  In order to help her get through painful radiation, will also add 1 mg of Dilaudid 30 minutes prior to going down for radiation in conjunction with half a milligram p.o. Ativan.   Both may be repeated for 1 additional dose 30 minutes after the initial dose if the initial dose is not sufficient to get her through radiation.  There was concern with Dilaudid causing urinary retention, however, she is having retention regardless.  Additionally, Dilaudid had caused some nausea and concerned that this may be contributing to overusing anti-emetics with resultant prolongation of QT.  As she will ultimately benefit from being treated for underlying disease burden with radiation, I think it is reasonable to restart IV Dilaudid as needed prior to radiation treatment.  If pain remains consistent even with initiation of radiation, she will likely need her fentanyl patch dose increased.  I considered increasing with patch changed today, however, she wanted to keep at the same and try IV  Dilaudid for breakthrough pain related to radiation therapy rather than increasing fentanyl patch.  Constipation: Likely  opioid related.  I added on scheduled senna 2 tabs twice daily as well as MiraLAX as needed today.  Code Status:    Code Status Orders  (From admission, onward)        Start     Ordered   04/25/17 1205  Full code  Continuous     04/25/17 1204    Code Status History    Date Active Date Inactive Code Status Order ID Comments User Context   This patient has a current code status but no historical code status.       Prognosis:   Unable to determine  Discharge Planning:  To Be Determined  Care plan was discussed with patient and 2 of her sisters  Thank you for allowing the Palliative Medicine Team to assist in the care of this patient.   Total Time 45 Prolonged Time Billed No      Greater than 50%  of this time was spent counseling and coordinating care related to the above assessment and plan.  Micheline Rough, MD  Please contact Palliative Medicine Team phone at [phone] for questions and concerns.

## 2017-05-01 NOTE — Progress Notes (Signed)
Vitals taken after rad tx,  137/69, P=124,RR=20,Room air sats=98%, called Mechele Claude , patient stated the 1mg  Dilaudis didn't help much, report given to [facility] RN,patient on her way back 2:43 PM

## 2017-05-01 NOTE — Telephone Encounter (Signed)
Scheduled appt per 12/24 sch message - left message with appt date and time and call back number.

## 2017-05-01 NOTE — Progress Notes (Signed)
Bladder scan >704.  Straight cath 600 output, pt unable to lie flat for very long.

## 2017-05-01 NOTE — Progress Notes (Signed)
Patient pain low back sever, cannot lay on table for rest of radiation , verbal order to give 1mg  dilaudid IV,  Per Dr.,Freeman and Shona Simpson, Pa, slow IV push 1mg  dilaudid via right port a cath, then flushed with 81ml normal saline, patient tolerated well, sitting up on linac table 2:30 PM

## 2017-05-01 NOTE — Progress Notes (Signed)
Lindsay [name]   DOB:[birth year]   LP#:[id number]   DZH#:[id number]  Subjective:  Lindsay [name] tells me her pain is better contyrolled-- she just had dilaudid in preparation for transfer to XRT. However she again cannot urinate. Is having Bms; is eating some; friend in room  Objective: middle aged African American woman examiend in bed Vitals:   04/30/17 2058 05/01/17 0436  BP: (!) 146/84 132/80  Pulse: (!) 128 (!) 132  Resp: 20 (!) 21  Temp: 99.5 F (37.5 C) 99.2 F (37.3 C)  SpO2: 96% 100%    Body mass index is 27.65 kg/m.  Intake/Output Summary (Last 24 hours) at 05/01/2017 0958 Last data filed at 04/30/2017 2300 Gross per 24 hour  Intake 360 ml  Output 1500 ml  Net -1140 ml    Sclerae unicteric, EOMs intact Lungs no rales or rhonchi--auscultated anterolaterally Heart regular rate and rhythm Abd soft, nontender, positive bowel sounds Neuro: nonfocal, well oriented, appropriate affect Breasts: Deferred   CBG (last 3)  No results for input(s): GLUCAP in the last 72 hours.   Labs:  Lab Results  Component Value Date   WBC 5.0 04/30/2017   HGB 8.0 (L) 04/30/2017   HCT 24.5 (L) 04/30/2017   MCV 87.2 04/30/2017   PLT 105 (L) 04/30/2017   NEUTROABS 4.1 04/30/2017    _0 @  Urine Studies No results for input(s): UHGB, CRYS in the last 72 hours.  Invalid input(s): UACOL, UAPR, USPG, UPH, UTP, UGL, UKET, UBIL, UNIT, UROB, ULEU, UEPI, UWBC, Junie Panning [place], [place], [place]  Basic Metabolic Panel: Recent Labs  Lab 04/25/17 0557 04/25/17 1752  04/27/17 0346 04/28/17 0342 04/29/17 0321 04/29/17 1130 04/30/17 0426 05/01/17 0544  NA 136 138   < > 140 141 141 138 137 135  K 2.3* 2.7*   < > 4.2 4.7 5.3* 5.1 4.4 3.8  CL 97* 105   < > 111 114* 114* 110 107 104  CO2 22 23   < > 23 23 21* 21* 21* 20*  GLUCOSE 104* 104*   < > 72 83 72 76 76 83  BUN 11 9   < > <5* _1 CREATININE 1.06* 0.70   < > 0.58 0.49 0.50 0.46 0.50 0.45  CALCIUM 5.9* 5.3*   < > 5.9* 6.9*  7.5* 7.5* 7.7* 7.5*  MG 1.5* 1.7  --  2.0  --   --   --   --   --    < > = values in this interval not displayed.   GFR Estimated Creatinine Clearance: 85.6 mL/min (by C-G formula based on SCr of 0.45 mg/dL). Liver Function Tests: Recent Labs  Lab 04/25/17 0557 04/25/17 1752 04/26/17 1021  AST 147* 117* 117*  ALT 85* 68* 65*  ALKPHOS 47 42 42  BILITOT 1.8* 1.6* 1.3*  PROT 7.1 6.1* 6.2*  ALBUMIN 3.8 3.2* 3.3*   No results for input(s): LIPASE, AMYLASE in the last 168 hours. No results for input(s): AMMONIA in the last 168 hours. Coagulation profile Recent Labs  Lab 04/25/17 0557  INR 1.24    CBC: Recent Labs  Lab 04/25/17 0557 04/27/17 0930 04/28/17 0342 04/29/17 0321 04/29/17 0810 04/30/17 0426 04/30/17 1540  WBC 9.5 7.7 6.4 7.4  --  5.1 5.0  NEUTROABS 6.4 5.7 5.9 5.8  --  4.1  --   HGB 6.4* 8.4* 7.9* 5.7* 8.0* 7.9* 8.0*  HCT 19.3* 25.3* 24.5* 17.3* 24.5* 24.5* 24.5*  MCV 83.9 86.1 87.2 87.4  --  87.8 87.2  PLT 316 149* 118* 134*  --  99* 105*   Cardiac Enzymes: Recent Labs  Lab 04/25/17 0557  CKTOTAL 557*   BNP: Invalid input(s): POCBNP CBG: No results for input(s): GLUCAP in the last 168 hours. D-Dimer No results for input(s): DDIMER in the last 72 hours. Hgb A1c No results for input(s): HGBA1C in the last 72 hours. Lipid Profile No results for input(s): CHOL, HDL, LDLCALC, TRIG, CHOLHDL, LDLDIRECT in the last 72 hours. Thyroid function studies No results for input(s): TSH, T4TOTAL, T3FREE, THYROIDAB in the last 72 hours.  Invalid input(s): FREET3 Anemia work up No results for input(s): VITAMINB12, FOLATE, FERRITIN, TIBC, IRON, RETICCTPCT in the last 72 hours. Microbiology Recent Results (from the past 240 hour(s))  Culture, blood (Routine x 2)     Status: None   Collection Time: 04/25/17  5:57 AM  Result Value Ref Range Status   Specimen Description BLOOD PORTA CATH  Final   Special Requests   Final    BOTTLES DRAWN AEROBIC AND ANAEROBIC  Blood Culture adequate volume   Culture   Final    NO GROWTH 5 DAYS Performed at [facility], [address], [place], [place] [address]    Report Status 04/30/2017 FINAL  Final  Urine C&S     Status: None   Collection Time: 04/25/17  8:37 AM  Result Value Ref Range Status   Specimen Description URINE, CLEAN CATCH  Final   Special Requests Immunocompromised  Final   Culture   Final    NO GROWTH Performed at [facility], [place] [address], [place], [place] [address]    Report Status 04/26/2017 FINAL  Final      Studies:  Mr Lumbar Spine Wo Contrast  Result Date: 04/29/2017 CLINICAL DATA:  Initial evaluation for back pain, buttock pain with weakness. History of metastatic breast cancer. EXAM: MRI LUMBAR SPINE WITHOUT CONTRAST TECHNIQUE: Multiplanar, multisequence MR imaging of the lumbar spine was performed. No intravenous contrast was administered. COMPARISON:  Prior CT from 04/25/2017. FINDINGS: Segmentation: Study moderately degraded by motion artifact. Additionally, patient was unable to tolerate the full length of the exam. No contrast was administered. Normal segmentation with the lowest well-formed disc labeled the L5-S1 level. Alignment: Normal alignment with preservation of the normal lumbar lordosis. No listhesis. Vertebrae: Bone marrow signal intensity is diffusely abnormal, likely related to diffuse osseous metastases. Vertebral body heights are relatively maintained without evidence for pathologic fracture. Large metastatic implant involving the left aspect of the L4 vertebral body with extension into the left pedicle. Implant measures approximately 5.3 x 4.3 cm in greatest dimensions. Extra osseous extension with tumor in the adjacent paraspinous soft tissues. Epidural tumor present within the ventral and left lateral epidural space at this level (series 7, image 24). Extension into the left L3-4 and L4-5 neural foramina. The Additional metastatic implant involving the  right sacral ala demonstrates early invasion into the right S1 neural foramen (series 7, image 38). Small amount of epidural tumor just inferiorly as well (series 7, image 40). No other significant epidural tumor identified on this motion degraded exam. Conus medullaris and cauda equina: Conus extends to the L1 level. Conus and cauda equina appear normal. Paraspinal and other soft tissues: Soft tissue edema within the left paraspinous and psoas musculature adjacent to the left-sided metastasis at the L4 level. Left psoas muscle is displaced anteriorly and laterally. Abnormal edema within the left iliacus insula as well. Scattered left iliac nodes measure  up to 2 cm in short axis, likely nodal metastases. Known hepatic metastases partially visualize. He scattered cystic lesions noted within the kidneys. Disc levels: L1-2:  Unremarkable. L2-3: Diffuse disc bulge. Mild facet hypertrophy. No significant canal stenosis. Mild bilateral L2 foraminal narrowing. L3-4: Diffuse disc bulge with disc desiccation. Tumor involves the left L3-4 neural foramen. Mild spinal stenosis. Moderate to severe left L3 foraminal narrowing due to metastasis. Right neural foramen patent. L4-5: Mild disc bulge. Tumor extends into the left L4-5 neural foramen. Epidural tumor within the ventral and lateral epidural space posterior to the L4 vertebral body. Secondary moderate spinal stenosis. Thecal sac measures 9 mm in AP diameter at its most narrow point. Moderate left L4 foraminal narrowing. No significant right foraminal encroachment. L5-S1: Diffuse disc bulge with disc desiccation. Epidural lipomatosis. No significant stenosis. IMPRESSION: 1. Limited exam due to patient's inability to tolerate the full length of the study. Additionally, images provided are degraded by motion artifact. 2. Findings consistent with diffuse osseous metastases. 3. Large metastatic implant centered at the left aspect of the L4 with associated paraspinous and  epidural extension as above. Tumor extends into the left L3-4 and L4-5 neural foramina. Associated moderate spinal stenosis at the level of L4. 4. Additional metastasis at the right sacral ala with extension into the right S1 neural foramen, likely affecting the transiting right S1 nerve. 5. No associated pathologic fracture at this time. 6. Hepatic and left iliac nodal metastases, partially visualized, better evaluated on recent CT. 7. Mild multilevel degenerative changes as above. Electronically Signed   By: Jeannine Boga M.D.   On: 04/29/2017 00:21   Ct Abdomen Pelvis W Contrast  Result Date: 04/25/2017 CLINICAL DATA:  Urinary retention.History of metastatic disease. EXAM: CT ABDOMEN AND PELVIS WITH CONTRAST TECHNIQUE: Multidetector CT imaging of the abdomen and pelvis was performed using the standard protocol following bolus administration of intravenous contrast. CONTRAST:  14m ISOVUE-300 IOPAMIDOL (ISOVUE-300) INJECTION 61% COMPARISON:  Abdominal MRI 03/01/2017.  CT abdomen 08/14/2015. FINDINGS: Lower chest: Heart unremarkable. Innumerable bilateral pulmonary nodules identified. Dominant nodule in the medial left lung base measures 18 x 12 mm on image 17 of series 7. Hepatobiliary: Multiple liver metastases are evident. 4.4 cm lesion in the medial segment left liver appears to be the same lesion that was about 14 mm on the previous MRI. Many of the hepatic lesions appear new in the interval and most of the lesions range in size from 5 mm up to about 14 mm. Gallbladder surgically absent. No intrahepatic or extrahepatic biliary dilation. Pancreas: No focal mass lesion. No dilatation of the main duct. No intraparenchymal cyst. No peripancreatic edema. Spleen: No splenomegaly. No focal mass lesion. Adrenals/Urinary Tract: No adrenal nodule or mass. Kidneys unremarkable. No evidence for hydroureter. Foley catheter decompresses the urinary bladder. Stomach/Bowel: Stomach is nondistended. No gastric  wall thickening. No evidence of outlet obstruction. Duodenum is normally positioned as is the ligament of Treitz. No small bowel wall thickening. No small bowel dilatation. The terminal ileum is normal. The appendix is normal. No gross colonic mass. No colonic wall thickening. No substantial diverticular change. Vascular/Lymphatic: No abdominal aortic aneurysm. No abdominal aortic atherosclerotic calcification. There is no gastrohepatic or hepatoduodenal ligament lymphadenopathy. No intraperitoneal or retroperitoneal lymphadenopathy. 11 mm short axis necrotic left common iliac lymph nodes seen image 38 series 3. 19 mm necrotic left iliac lymph node visible on image 46. Reproductive: Uterus surgically absent.  There is no adnexal mass. Other: No intraperitoneal free fluid. Musculoskeletal: Lytic bone  lesions are seen scattered through the thoracolumbar spine and bony pelvis. IMPRESSION: 1. Metastatic disease in the liver has progressed since MRI of 03/01/2017. 2. Necrotic left iliac lymphadenopathy compatible with metastatic disease. 3. Innumerable pulmonary nodules consistent with metastatic involvement. 4. Diffuse lytic bone metastases in the thoracolumbar spine and bony pelvis. 5. Foley catheter decompresses the urinary bladder. Electronically Signed   By: Misty Stanley M.D.   On: 04/25/2017 11:20   Dg Chest Portable 1 View  Result Date: 04/25/2017 CLINICAL DATA:  Initial evaluation for acute weakness. History of breast cancer. EXAM: PORTABLE CHEST 1 VIEW COMPARISON:  Prior radiograph from 04/06/2017. FINDINGS: Right-sided Port-A-Cath in place, stable. Cardiac and mediastinal silhouettes are stable in size and contour, and remain within normal limits. Lungs are mildly hypoinflated. No focal infiltrates. No pulmonary edema or pleural effusion. No pneumothorax. No acute osseus abnormality. IMPRESSION: No active cardiopulmonary disease. Electronically Signed   By: Jeannine Boga M.D.   On: 04/25/2017 06:32    Dg Chest Port 1 View  Result Date: 04/06/2017 CLINICAL DATA:  Port catheter placement. EXAM: PORTABLE CHEST 1 VIEW COMPARISON:  MRI abdomen dated March 01, 2017. Chest x-ray dated January 24, 2016. FINDINGS: Right port catheter with the tip projecting over the proximal SVC. The cardiomediastinal silhouette is normal in size. Normal pulmonary vascularity. Increased reticulonodular interstitial lung markings in both lung bases. No pleural effusion or pneumothorax. No acute osseous abnormality. IMPRESSION: 1. Right port catheter with tip projecting over the proximal SVC. No pneumothorax. 2. Increased reticulonodular densities in both lower lobes, consistent with known pulmonary metastatic disease. Electronically Signed   By: Titus Dubin M.D.   On: 04/06/2017 09:26   Dg Fluoro Guide Cv Line-no Report  Result Date: 04/06/2017 Fluoroscopy was utilized by the requesting physician.  No radiographic interpretation.   Dg Hip Unilat With Pelvis 2-3 Views Left  Result Date: 04/27/2017 CLINICAL DATA:  Acute onset of severe left hip pain which began this morning. No known injury. Current history of stage IV breast cancer for which the patient receiving chemotherapy. EXAM: DG HIP (WITH OR WITHOUT PELVIS) 2-3V LEFT COMPARISON:  None. FINDINGS: No evidence of acute fracture or dislocation. Well preserved joint space. Well preserved bone mineral density. No intrinsic osseous abnormality. Included AP pelvis demonstrates a normal-appearing contralateral right hip. Sacroiliac joints and symphysis pubis intact. Visualized lower lumbar spine unremarkable. Opaque material is present external to the patient overlying the inner thigh. IMPRESSION: Normal examination. Electronically Signed   By: Evangeline Dakin M.D.   On: 04/27/2017 20:00    Assessment: 51 y.o. [place] woman status post right breast upper outer quadrant biopsy 12/14/2015 for a clinical T1c N0, stage IA  invasive ductal carcinoma, grade 3, triple  negative, with an MIB-1 of 70%             (a) this stages as IIB In the 2018 new Prognostic classification  (1) genetics testing negative for mutations within any of 20 genes on the Breast/Ovarian Cancer Panel through [employer]. Additionally, no VUS were found.   (2) neoadjuvant chemotherapy consisting of doxorubicin and cyclophosphamide in dose this fashion 4, completed 02/22/2016, followed by paclitaxel weekly 5 (of 12 treatments planned), starting 03/08/2016, stopped 04/05/2016 after cycle 5 because of neuropathy             (a) cycle 4 of cyclophosphamide and doxorubicin held 1 week because of side effects  (3) status post right lumpectomy and sentinel lymph node sampling 05/05/2016 for a residual ypT2 ypN)  invasive ductal carcinoma, grade 3, the repeat prognostic panel again triple negative the residual cancer burden was 2  (4) adjuvant radiation 06/20/16 - 08/04/16 : 50.4 Gy to the right breast plus a 10 Gy boost  (5) left thyroid nodule biopsy 04/14/2016 read as [facility] 1  METASTATIC DISEASE: October 2018 (6) MRI of the abdomen March 01, 2017 shows multiple lesions in the lungs, liver, and bones             (a) the tumor is PD-L1 negative--not a candidate for pembrolizumab   (b) CA 27-29 not informative  (7) started capecitabine March 14, 2017;              (a) discontinued 03/19/2017 with multiple side effects   (8) started denosumab/Xgeva March 10, 2017, most recent dose 84/69/6295  (a) complicated by severe hypocalcemia  (9) pain syndrome: .control complicated by nausea/vomiting on hydromorphone, urinary retention on hydromorphone/fentanyl, generally better pain control on fentanyl  (10) started CMF chemotherapy 04/06/2017, to be repeated every 21 days  (a) cycle 2 delayed due to admission for uncontrolled pain; next cycle 05/18/2017  (11) severe anemia--no B-12, folate or iron deficiency; very elevated LDH and mild hyperbilirubinemia but negative  Coombs and normal haptoglobin; no schistocytes on smear; retics 79.4; nl creatinine             (a) RBC underproduction secondary to cancer and chemo; transfuse PRN  (12) electrolyte abnormalities-- being corrected.         REVIEW OF BLOOD FILM: 04/28/2017 Don't see schistocytes; no nucleated RBC's or immature WBCs-- no evidence of leukoerythroblastic picture; no platelets clumps; some teardrops but no significant RBC anisocytosis  Plan:  Iyonna's very high LDH suggests necrosis of tumor in liver. This, in conjunction with her radiation treatments planned through JAN 08, mean we will not give her next cgemo until JAN 10, as already scheduled.  Her pain is generally well controlled on fentanyl 25 mch/h but she still has severe urinary retention-- may need in-out catheterization QID or foley. She is not enthusiastic about either option but if she could learn to self cath that may be the preferred way to go, as we expect the pain to decrease overall with radiation, and the retention to resolve as she uses less pain medication.  I will be out of town until 12/28. Please contact my partners as needed if any problemsdevelop before then  Bobetta Lime, MD 05/01/2017  9:58 AM Medical Oncology and Hematology [facility] [place], [place] [address] Tel. [phone]    Fax. [phone]

## 2017-05-02 DIAGNOSIS — R52 Pain, unspecified: Secondary | ICD-10-CM

## 2017-05-02 DIAGNOSIS — Z515 Encounter for palliative care: Secondary | ICD-10-CM

## 2017-05-02 LAB — BASIC METABOLIC PANEL
Anion gap: 11 (ref 5–15)
BUN: 6 mg/dL (ref 6–20)
CALCIUM: 7.5 mg/dL — AB (ref 8.9–10.3)
CO2: 21 mmol/L — AB (ref 22–32)
CREATININE: 0.53 mg/dL (ref 0.44–1.00)
Chloride: 105 mmol/L (ref 101–111)
GFR calc Af Amer: >60 mL/min (ref >60)
GFR calc non Af Amer: >60 mL/min (ref >60)
GLUCOSE: 88 mg/dL (ref 65–99)
Potassium: 3.5 mmol/L (ref 3.5–5.1)
Sodium: 137 mmol/L (ref 135–145)

## 2017-05-02 LAB — CBC
HEMATOCRIT: 23.7 % — AB (ref 36.0–46.0)
Hemoglobin: 7.8 g/dL — ABNORMAL LOW (ref 12.0–15.0)
MCH: 28.6 pg (ref 26.0–34.0)
MCHC: 32.9 g/dL (ref 30.0–36.0)
MCV: 86.8 fL (ref 78.0–100.0)
Platelets: 123 10*3/uL — ABNORMAL LOW (ref 150–400)
RBC: 2.73 MIL/uL — ABNORMAL LOW (ref 3.87–5.11)
RDW: 17.5 % — AB (ref 11.5–15.5)
WBC: 5.2 10*3/uL (ref 4.0–10.5)

## 2017-05-02 LAB — MAGNESIUM: Magnesium: 1.3 mg/dL — ABNORMAL LOW (ref 1.7–2.4)

## 2017-05-02 NOTE — Progress Notes (Signed)
Patient ID: Lindsay [name], female   DOB: [birth year], 51 y.o.   MRN: [id number]  PROGRESS NOTE    Lindsay [name]  PPI:[id number] DOB: [birth year] DOA: 04/25/2017  PCP: Bernerd Limbo, MD   Brief Narrative:  51 y.o. female past medical history significant for triple negative stage IV breast, depression presents to the ED with inability to urinate started 4 days prior to admission, accompanied by lower abdominal discomfort.   Assessment & Plan:  Acute Urinary retention - CT scan of the abdomen and pelvis does not show any obstruction. - Likely due to medications diphenhydramine, and probably narcotics, in the setting of electrolytes imbalance. - May need to do in and out cath even on discharge as she still seems to be having trouble with urinary retention  Prolonged QT - Likely due to electrolyte derangements - Improved   Hypocalcemia - Likely due to to denosumab - Follow up BMP in am  Hypokalemia / Hypomagnesemia  - Supplemented   Stage IV triple negative breast cancer/with new metastatic disease to the lumbar spine: - X-ray of the left hip showed no fracture dislocation of metastatic disease. MRI of the lumbar spine performed on 04/28/2017 showed diffuse osseous metastatic disease, with a large implant at L4, with tumor extending into L3-L4 and the L4-L5 neural foramina, at this time she is only complaining of pain no focal deficits.  Oncology will consult radiation oncology for possible XRT to the left hip area, to be started on 05/01/2017. - Manage per oncology   Elevated LFTs - Due to osseous mets - High LDH suggests liver tumor necrosis  Chemotherapy-induced anemia: - Status post 1 unit of packed red blood cells no evidence of bleeding. - Follow up CBC in am   DVT prophylaxis: Lovenox subQ Code Status: full code  Family Communication: husband at the bedside  Disposition Plan: not yet stable for d/c   Consultants:   None  Procedures:    None  Antimicrobials:   None   Subjective: Pain is 5/10 in intensity.  Objective: Vitals:   05/01/17 0436 05/01/17 1950 05/02/17 0458 05/02/17 1233  BP: 132/80 (!) 148/89 139/83 136/82  Pulse: (!) 132 (!) 136 (!) 123 (!) 122  Resp: (!) 21 17 20    Temp: 99.2 F (37.3 C) 98.5 F (36.9 C) 98.7 F (37.1 C) 98.2 F (36.8 C)  TempSrc: Oral Oral Oral Oral  SpO2: 100% 97% 96% 97%  Weight:    77.3 kg (170 lb 6.7 oz)  Height:        Intake/Output Summary (Last 24 hours) at 05/02/2017 1236 Last data filed at 05/02/2017 0501 Gross per 24 hour  Intake -  Output 1500 ml  Net -1500 ml   Filed Weights   04/25/17 0527 04/25/17 1705 05/02/17 1233  Weight: 77.1 kg (170 lb) 77.7 kg (171 lb 4.8 oz) 77.3 kg (170 lb 6.7 oz)    Physical Exam  Constitutional: Appears well-developed and well-nourished. No distress.  CVS: RRR, S1/S2 + Pulmonary: Effort and breath sounds normal, no stridor, rhonchi, wheezes, rales.  Abdominal: Soft. BS +,  no distension, tenderness, rebound or guarding.  Musculoskeletal: No edema and no tenderness.  Lymphadenopathy: No lymphadenopathy noted, cervical, inguinal. Neuro: Alert. No cranial nerve deficit. Skin: Skin is warm and dry.  Psychiatric: Normal mood and affect. Behavior, judgment, thought content normal.     Data Reviewed: I have personally reviewed following labs and imaging studies  CBC: Recent Labs  Lab 04/27/17 0930 04/28/17 0342 04/29/17 0321 04/29/17  3244 04/30/17 0426 04/30/17 1540 05/02/17 0326  WBC 7.7 6.4 7.4  --  5.1 5.0 5.2  NEUTROABS 5.7 5.9 5.8  --  4.1  --   --   HGB 8.4* 7.9* 5.7* 8.0* 7.9* 8.0* 7.8*  HCT 25.3* 24.5* 17.3* 24.5* 24.5* 24.5* 23.7*  MCV 86.1 87.2 87.4  --  87.8 87.2 86.8  PLT 149* 118* 134*  --  99* 105* 010*   Basic Metabolic Panel: Recent Labs  Lab 04/25/17 1752  04/27/17 0346  04/29/17 0321 04/29/17 1130 04/30/17 0426 05/01/17 0544 05/02/17 0326  NA 138   < > 140   < > 141 138 137 135 137   K 2.7*   < > 4.2   < > 5.3* 5.1 4.4 3.8 3.5  CL 105   < > 111   < > 114* 110 107 104 105  CO2 23   < > 23   < > 21* 21* 21* 20* 21*  GLUCOSE 104*   < > 72   < > 72 76 76 83 88  BUN 9   < > <5*   < > 7 6 7 6 6   CREATININE 0.70   < > 0.58   < > 0.50 0.46 0.50 0.45 0.53  CALCIUM 5.3*   < > 5.9*   < > 7.5* 7.5* 7.7* 7.5* 7.5*  MG 1.7  --  2.0  --   --   --   --   --  1.3*   < > = values in this interval not displayed.   GFR: Estimated Creatinine Clearance: 85.4 mL/min (by C-G formula based on SCr of 0.53 mg/dL). Liver Function Tests: Recent Labs  Lab 04/25/17 1752 04/26/17 1021  AST 117* 117*  ALT 68* 65*  ALKPHOS 42 42  BILITOT 1.6* 1.3*  PROT 6.1* 6.2*  ALBUMIN 3.2* 3.3*   No results for input(s): LIPASE, AMYLASE in the last 168 hours. No results for input(s): AMMONIA in the last 168 hours. Coagulation Profile: No results for input(s): INR, PROTIME in the last 168 hours. Cardiac Enzymes: No results for input(s): CKTOTAL, CKMB, CKMBINDEX, TROPONINI in the last 168 hours. BNP (last 3 results) No results for input(s): PROBNP in the last 8760 hours. HbA1C: No results for input(s): HGBA1C in the last 72 hours. CBG: No results for input(s): GLUCAP in the last 168 hours. Lipid Profile: No results for input(s): CHOL, HDL, LDLCALC, TRIG, CHOLHDL, LDLDIRECT in the last 72 hours. Thyroid Function Tests: No results for input(s): TSH, T4TOTAL, FREET4, T3FREE, THYROIDAB in the last 72 hours. Anemia Panel: No results for input(s): VITAMINB12, FOLATE, FERRITIN, TIBC, IRON, RETICCTPCT in the last 72 hours. Urine analysis:    Component Value Date/Time   COLORURINE YELLOW 04/25/2017 [place] 04/25/2017 0837   LABSPEC 1.008 04/25/2017 0837   PHURINE 6.0 04/25/2017 0837   GLUCOSEU NEGATIVE 04/25/2017 0837   HGBUR NEGATIVE 04/25/2017 0837   BILIRUBINUR NEGATIVE 04/25/2017 0837   KETONESUR 5 (A) 04/25/2017 0837   PROTEINUR NEGATIVE 04/25/2017 0837   UROBILINOGEN 1.0  03/03/2015 1240   NITRITE NEGATIVE 04/25/2017 0837   LEUKOCYTESUR NEGATIVE 04/25/2017 0837   Sepsis Labs: @LABRCNTIP (procalcitonin:4,lacticidven:4)   Recent Results (from the past 240 hour(s))  Culture, blood (Routine x 2)     Status: None   Collection Time: 04/25/17  5:57 AM  Result Value Ref Range Status   Specimen Description BLOOD PORTA CATH  Final   Special Requests   Final  BOTTLES DRAWN AEROBIC AND ANAEROBIC Blood Culture adequate volume   Culture   Final    NO GROWTH 5 DAYS Performed at [facility], [place] [address] [place], [place] [address]    Report Status 04/30/2017 FINAL  Final  Urine C&S     Status: None   Collection Time: 04/25/17  8:37 AM  Result Value Ref Range Status   Specimen Description URINE, CLEAN CATCH  Final   Special Requests Immunocompromised  Final   Culture   Final    NO GROWTH Performed at [facility], [place] [address] [place], [place] [address]    Report Status 04/26/2017 FINAL  Final      Radiology Studies: Mr Lumbar Spine Wo Contrast  Result Date: 04/29/2017 CLINICAL DATA:  Initial evaluation for back pain, buttock pain with weakness. History of metastatic breast cancer. EXAM: MRI LUMBAR SPINE WITHOUT CONTRAST TECHNIQUE: Multiplanar, multisequence MR imaging of the lumbar spine was performed. No intravenous contrast was administered. COMPARISON:  Prior CT from 04/25/2017. FINDINGS: Segmentation: Study moderately degraded by motion artifact. Additionally, patient was unable to tolerate the full length of the exam. No contrast was administered. Normal segmentation with the lowest well-formed disc labeled the L5-S1 level. Alignment: Normal alignment with preservation of the normal lumbar lordosis. No listhesis. Vertebrae: Bone marrow signal intensity is diffusely abnormal, likely related to diffuse osseous metastases. Vertebral body heights are relatively maintained without evidence for pathologic fracture. Large metastatic implant  involving the left aspect of the L4 vertebral body with extension into the left pedicle. Implant measures approximately 5.3 x 4.3 cm in greatest dimensions. Extra osseous extension with tumor in the adjacent paraspinous soft tissues. Epidural tumor present within the ventral and left lateral epidural space at this level (series 7, image 24). Extension into the left L3-4 and L4-5 neural foramina. The Additional metastatic implant involving the right sacral ala demonstrates early invasion into the right S1 neural foramen (series 7, image 38). Small amount of epidural tumor just inferiorly as well (series 7, image 40). No other significant epidural tumor identified on this motion degraded exam. Conus medullaris and cauda equina: Conus extends to the L1 level. Conus and cauda equina appear normal. Paraspinal and other soft tissues: Soft tissue edema within the left paraspinous and psoas musculature adjacent to the left-sided metastasis at the L4 level. Left psoas muscle is displaced anteriorly and laterally. Abnormal edema within the left iliacus insula as well. Scattered left iliac nodes measure up to 2 cm in short axis, likely nodal metastases. Known hepatic metastases partially visualize. He scattered cystic lesions noted within the kidneys. Disc levels: L1-2:  Unremarkable. L2-3: Diffuse disc bulge. Mild facet hypertrophy. No significant canal stenosis. Mild bilateral L2 foraminal narrowing. L3-4: Diffuse disc bulge with disc desiccation. Tumor involves the left L3-4 neural foramen. Mild spinal stenosis. Moderate to severe left L3 foraminal narrowing due to metastasis. Right neural foramen patent. L4-5: Mild disc bulge. Tumor extends into the left L4-5 neural foramen. Epidural tumor within the ventral and lateral epidural space posterior to the L4 vertebral body. Secondary moderate spinal stenosis. Thecal sac measures 9 mm in AP diameter at its most narrow point. Moderate left L4 foraminal narrowing. No significant  right foraminal encroachment. L5-S1: Diffuse disc bulge with disc desiccation. Epidural lipomatosis. No significant stenosis. IMPRESSION: 1. Limited exam due to patient's inability to tolerate the full length of the study. Additionally, images provided are degraded by motion artifact. 2. Findings consistent with diffuse osseous metastases. 3. Large  metastatic implant centered at the left aspect of the L4 with associated paraspinous and epidural extension as above. Tumor extends into the left L3-4 and L4-5 neural foramina. Associated moderate spinal stenosis at the level of L4. 4. Additional metastasis at the right sacral ala with extension into the right S1 neural foramen, likely affecting the transiting right S1 nerve. 5. No associated pathologic fracture at this time. 6. Hepatic and left iliac nodal metastases, partially visualized, better evaluated on recent CT. 7. Mild multilevel degenerative changes as above. Electronically Signed   By: Jeannine Boga M.D.   On: 04/29/2017 00:21        Scheduled Meds: . calcium carbonate   1,000 mg of elemental calcium Oral Once  . enoxaparin   40 mg Subcutaneous Q24H  . fentaNYL  25 mcg Transdermal Q72H  . prochlorperazine  10 mg Intravenous TID AC   Continuous Infusions:   LOS: 7 days    Time spent: 15 minutes  Greater than 50% of the time spent on counseling and coordinating the care.   Leisa Lenz, MD [facility] Pager [phone]  If 7PM-7AM, please contact night-coverage [email or web address] Password [facility] 05/02/2017, 12:36 PM

## 2017-05-02 NOTE — Progress Notes (Signed)
Bladder scanned pt at 1300.  Got 357ml at that time.  Pt denies discomfort and did not want to in/out cath at that time.  Went back to rescan at 1500, pt eating christmas lunch with her family.  Instructed pt to call me when she is through eating.

## 2017-05-02 NOTE — Progress Notes (Signed)
Daily Progress Note   Patient Name: Lindsay [name]       Date: 05/02/2017 DOB: 08-May-1966  Age: 51 y.o. MRN#: [id number] Attending Physician: Robbie Lis, MD Primary Care Physician: Bernerd Limbo, MD Admit Date: 04/25/2017  Reason for Consultation/Follow-up: Pain control  Subjective: I met with Lindsay [name] and her sister this morning.  Reports pain is no better/no worse this am. She is having some cramps in her L leg, she did try walking a few steps outside her room this morning, she is asking about going home.     We reviewed plan for pain management as well as discussed again etiology of her pain and role of radiation therapy with palliative intent.  See below:  Length of Stay: 7  Current Medications: Scheduled Meds:  . calcium carbonate (dosed in mg elemental calcium)  1,000 mg of elemental calcium Oral Once  . chlorhexidine  15 mL Mouth Rinse BID  . enoxaparin (LOVENOX) injection  40 mg Subcutaneous Q24H  . fentaNYL  25 mcg Transdermal Q72H  . LORazepam  0.5 mg Oral Once  . mouth rinse  15 mL Mouth Rinse q12n4p  . prochlorperazine  10 mg Intravenous TID AC  . senna  2 tablet Oral BID    Continuous Infusions:   PRN Meds: ALPRAZolam, diphenhydrAMINE **OR** diphenhydrAMINE, fentaNYL (SUBLIMAZE) injection, HYDROmorphone (DILAUDID) injection, HYDROmorphone, iopamidol, LORazepam, naloxone **AND** sodium chloride flush, ondansetron (ZOFRAN) IV, polyethylene glycol, sodium chloride flush  Physical Exam         General: Sleepy, but alert, in no acute distress.  HEENT: No bruits, no goiter, no JVD Heart: Regular rate and rhythm. No murmur appreciated. Lungs: Good air movement, clear Abdomen: Soft, nontender, nondistended, positive bowel sounds.  Ext: No significant  edema Skin: Warm and dry Neuro: Grossly intact, nonfocal.   Vital Signs: BP 139/83 (BP Location: Left Arm)   Pulse (!) 123   Temp 98.7 F (37.1 C) (Oral)   Resp 20   Ht 5' 6"  (1.676 m)   Wt 77.7 kg (171 lb 4.8 oz)   SpO2 96%   BMI 27.65 kg/m  SpO2: SpO2: 96 % O2 Device: O2 Device: Not Delivered O2 Flow Rate: O2 Flow Rate (L/min): 0 L/min  Intake/output summary:   Intake/Output Summary (Last 24 hours) at 05/02/2017 0957 Last data filed at  05/02/2017 0501 Gross per 24 hour  Intake -  Output 1500 ml  Net -1500 ml   LBM: Last BM Date: 04/28/17 Baseline Weight: Weight: 77.1 kg (170 lb) Most recent weight: Weight: 77.7 kg (171 lb 4.8 oz)       Palliative Assessment/Data:    Flowsheet Rows     Most Recent Value  Intake Tab  Referral Department  Hospitalist  Unit at Time of Referral  Oncology Unit  Palliative Care Primary Diagnosis  Cancer  Date Notified  04/28/17  Palliative Care Type  New Palliative care  Reason for referral  Pain  Date of Admission  04/25/17  Date first seen by Palliative Care  04/28/17  # of days Palliative referral response time  0 Day(s)  # of days IP prior to Palliative referral  3  Clinical Assessment  Palliative Performance Scale Score  50%  Pain Max last 24 hours  10  Pain Min Last 24 hours  5  Psychosocial & Spiritual Assessment  Palliative Care Outcomes  Patient/Family meeting held?  No      Patient Active Problem List   Diagnosis Date Noted  . Urinary retention 04/25/2017  . Hypocalcemia 04/25/2017  . Hypokalemia 04/25/2017  . Hypomagnesemia 04/25/2017  . Goals of care, counseling/discussion 03/24/2017  . Bone metastases ([place]) 03/03/2017  . Liver metastases ([place]) 03/03/2017  . Lung metastases ([place]) 03/03/2017  . Breast cancer metastasized to bone ([place]) 03/02/2017  . Genetic testing 03/20/2016  . Family history of breast cancer in female 02/10/2016  . Malignant neoplasm of upper-outer quadrant of right breast in female,  estrogen receptor negative ([place]) 12/16/2015  . Hx of adenomatous colonic polyps 04/25/2014  . Neck pain 11/06/2012    Palliative Care Assessment & Plan   Patient Profile: 51 y.o. female  with past medical history of HTN, depression, and triple negative breast CA with mets to lung, bone, and liver admitted on 04/25/2017 with urinary retention.  She was found to have prolonged QT and multiple metabolic derangements.  Still with significant pain predominantly in left hip.  Palliative consulted for assistance with symptom management with pain.  Recommendations/Plan: Pain: Overall, pain is better controlled on current regimen, patient to try heating pad today, doesn't want to change her current pain regimen for fear of arhythmia and QT prolongation.     Constipation: Likely opioid related.   scheduled senna 2 tabs twice daily as well as MiraLAX as needed    Code Status:    Code Status Orders  (From admission, onward)        Start     Ordered   04/25/17 1205  Full code  Continuous     04/25/17 1204    Code Status History    Date Active Date Inactive Code Status Order ID Comments User Context   This patient has a current code status but no historical code status.       Prognosis:   Unable to determine  Discharge Planning:  To Be Determined  Care plan was discussed with patient and  Sister   Thank you for allowing the Palliative Medicine Team to assist in the care of this patient.   Total Time 25 Prolonged Time Billed No      Greater than 50%  of this time was spent counseling and coordinating care related to the above assessment and plan.  Loistine Chance, MD [phone]  Please contact Palliative Medicine Team phone at [phone] for questions and concerns.

## 2017-05-02 NOTE — Plan of Care (Signed)
  Progressing Elimination: Will not experience complications related to bowel motility 05/02/2017 0817 - Progressing by Alexsys Eskin, Royetta Crochet, RN Note Started on senekot last night. Pain Managment: General experience of comfort will improve 05/02/2017 0817 - Progressing by Mumtaz Lovins, Royetta Crochet, RN Note Pain remains at 6/10

## 2017-05-03 ENCOUNTER — Telehealth: Payer: Self-pay | Admitting: *Deleted

## 2017-05-03 ENCOUNTER — Ambulatory Visit
Admit: 2017-05-03 | Discharge: 2017-05-03 | Disposition: A | Discharge status: Home / Self Care | Payer: BLUE CROSS/BLUE SHIELD | Attending: Radiation Oncology | Admitting: Radiation Oncology

## 2017-05-03 LAB — BASIC METABOLIC PANEL
Anion gap: 10 (ref 5–15)
BUN: 6 mg/dL (ref 6–20)
CALCIUM: 7.6 mg/dL — AB (ref 8.9–10.3)
CHLORIDE: 104 mmol/L (ref 101–111)
CO2: 23 mmol/L (ref 22–32)
CREATININE: 0.53 mg/dL (ref 0.44–1.00)
GFR calc Af Amer: >60 mL/min (ref >60)
GFR calc non Af Amer: >60 mL/min (ref >60)
Glucose, Bld: 100 mg/dL — ABNORMAL HIGH (ref 65–99)
Potassium: 3.5 mmol/L (ref 3.5–5.1)
Sodium: 137 mmol/L (ref 135–145)

## 2017-05-03 LAB — CBC
HCT: 23.7 % — ABNORMAL LOW (ref 36.0–46.0)
HEMOGLOBIN: 8 g/dL — AB (ref 12.0–15.0)
MCH: 29.2 pg (ref 26.0–34.0)
MCHC: 33.8 g/dL (ref 30.0–36.0)
MCV: 86.5 fL (ref 78.0–100.0)
Platelets: 152 10*3/uL (ref 150–400)
RBC: 2.74 MIL/uL — ABNORMAL LOW (ref 3.87–5.11)
RDW: 17.7 % — AB (ref 11.5–15.5)
WBC: 5.1 10*3/uL (ref 4.0–10.5)

## 2017-05-03 MED ORDER — SENNA 8.6 MG PO TABS: 2.0000 | ORAL_TABLET | Freq: Two times a day (BID) | ORAL | 0 refills | Status: DC

## 2017-05-03 MED ORDER — DIPHENHYDRAMINE HCL 12.5 MG/5ML PO ELIX: 12.5000 mg | ORAL_SOLUTION | Freq: Four times a day (QID) | ORAL | 0 refills | Status: AC | PRN

## 2017-05-03 MED ORDER — CHLORHEXIDINE GLUCONATE 0.12 % MT SOLN: 15.0000 mL | Freq: Two times a day (BID) | OROMUCOSAL | 0 refills | Status: DC

## 2017-05-03 MED ORDER — CALCIUM CARBONATE ANTACID 1250 MG/5ML PO SUSP: 1000.0000 mg | Freq: Once | ORAL | 0 refills | Status: AC

## 2017-05-03 MED ORDER — HYDROMORPHONE HCL 4 MG PO TABS: 4.0000 mg | ORAL_TABLET | ORAL | 0 refills | Status: DC | PRN

## 2017-05-03 MED ORDER — ALPRAZOLAM 0.5 MG PO TABS: 0.5000 mg | ORAL_TABLET | Freq: Every day | ORAL | 0 refills | Status: DC | PRN

## 2017-05-03 MED ORDER — FENTANYL 25 MCG/HR TD PT72: 25.0000 ug | MEDICATED_PATCH | TRANSDERMAL | 0 refills | Status: DC

## 2017-05-03 MED ORDER — ONDANSETRON HCL 8 MG PO TABS: 8.0000 mg | ORAL_TABLET | Freq: Two times a day (BID) | ORAL | 1 refills | Status: DC | PRN

## 2017-05-03 MED ORDER — HEPARIN SOD (PORK) LOCK FLUSH 100 UNIT/ML IV SOLN
500.0000 [IU] | INTRAVENOUS | Status: AC | PRN
  Administered 2017-05-03: 500 [IU]

## 2017-05-03 NOTE — Progress Notes (Signed)
[facility] Therapy Treatment Record Phone [phone]   Radiation Therapy was administered to Lindsay [name] on: 05/03/2017  12:31 PM and was treatment # 2 out of a planned course of 10 treatments.  Radiation Treatment  1). Beam photons with 6-10 energy  2). Brachytherapy None  3). Stereotactic Radiosurgery None  4). Other Radiation None     Dynasty Holquin J Jeno Calleros, RT

## 2017-05-03 NOTE — Discharge Summary (Signed)
Physician Discharge Summary  Lindsay [name] ZOX:[id number] DOB: Mar 03, 1966 DOA: 04/25/2017  PCP: Bernerd Limbo, MD  Admit date: 04/25/2017 Discharge date: 05/03/2017  Recommendations for Outpatient Follow-up:  1. Follow up in cancer center per sch appt 2. Continue radiation treatment per scheduled   Discharge Diagnoses:  Active Problems:   Malignant neoplasm of upper-outer quadrant of right breast in female, estrogen receptor negative ([place])   Breast cancer metastasized to bone [facility])   Liver metastases (HCC)   Lung metastases (HCC)   Urinary retention   Hypocalcemia   Hypokalemia   Hypomagnesemia   Pain   Encounter for palliative care    Discharge Condition: stable   Diet recommendation: as tolerated   History of present illness:  51 y.o.femalepast medical history significant for triple negative stage IV breast, depression presents to the ED with inability to urinate started 4 days prior to admission, accompanied by lower abdominal discomfort.  Hospital Course:   Acute Urinary retention - CT scan of the abdomen and pelvis does not show any obstruction. - Likely due to medications diphenhydramine, and probably narcotics, in the setting of electrolytes imbalance. - In and out cath versus foley, will try to find out if any C/I with having foley and RT. Pt prefers to have foley cath for urinary retention over in and out cath which may be difficult for her to do considering ongoing pain   Prolonged QT - Likely due to electrolyte derangements - Improved   Hypocalcemia - Likely due to to denosumab - Continue calcium supplementation   Hypokalemia / Hypomagnesemia  - Supplemented   Stage IV triple negative breast cancer/with new metastatic disease to the lumbar spine: - X-ray of the left hip showed no fracture dislocation of metastatic disease. MRI of the lumbar spine performed on 04/28/2017 showed diffuse osseous metastatic disease, with a large implant at L4, with  tumor extending into L3-L4 and the L4-L5 neural foramina, at this time she is only complaining of pain no focal deficits. Oncology will consult radiation oncology for possible XRT to the left hip area, to be started on 05/01/2017. - Manage per oncology   Elevated LFTs - Due to osseous mets - High LDH suggests liver tumor necrosis  Chemotherapy-induced anemia: - Status post 1 unit of packed red blood cells no evidence of bleeding. - Stable    DVT prophylaxis: Lovenox subQ Code Status: full code  Family Communication: husband at bedside   Consultants:   None  Procedures:   None  Antimicrobials:   None    Signed:  Leisa Lenz, MD  [facility] 05/03/2017, 8:44 AM  Pager #: [phone]  Time spent in minutes: more than 30 minutes    Discharge Exam: Vitals:   05/02/17 2019 05/03/17 0414  BP: 132/89 129/81  Pulse: (!) 130 (!) 130  Resp: (!) 22 (!) 21  Temp: 99.2 F (37.3 C) 98.7 F (37.1 C)  SpO2: 97% 94%   Vitals:   05/02/17 0458 05/02/17 1233 05/02/17 2019 05/03/17 0414  BP: 139/83 136/82 132/89 129/81  Pulse: (!) 123 (!) 122 (!) 130 (!) 130  Resp: 20  (!) 22 (!) 21  Temp: 98.7 F (37.1 C) 98.2 F (36.8 C) 99.2 F (37.3 C) 98.7 F (37.1 C)  TempSrc: Oral Oral Oral Oral  SpO2: 96% 97% 97% 94%  Weight:  77.3 kg (170 lb 6.7 oz)    Height:        General: Pt is alert, follows commands appropriately, not in acute distress Cardiovascular:  Regular rate and rhythm, S1/S2 +, no murmurs Respiratory: Clear to auscultation bilaterally, no wheezing, no crackles, no rhonchi Abdominal: Soft, non tender, non distended, bowel sounds +, no guarding Extremities: no edema, no cyanosis, pulses palpable bilaterally DP and PT Neuro: Grossly nonfocal  Discharge Instructions  Discharge Instructions    Call MD for:  persistant nausea and vomiting   Complete by:  As directed    Call MD for:  redness, tenderness, or signs of infection (pain, swelling,  redness, odor or green/yellow discharge around incision site)   Complete by:  As directed    Call MD for:  severe uncontrolled pain   Complete by:  As directed    Diet - low sodium heart healthy   Complete by:  As directed    Increase activity slowly   Complete by:  As directed      Allergies as of 05/03/2017      Reactions   Gabapentin Other (See Comments)   "light headed, dizziness, didn't like the way it made me feel"   Dilaudid [hydromorphone Hcl] Nausea And Vomiting   Percocet [oxycodone-acetaminophen] Diarrhea, Nausea And Vomiting   Pollen Extract Other (See Comments)   Runny nose, itchy eyes and sneezing due to seasonal allergies.   Tramadol Nausea And Vomiting   Vicodin [hydrocodone-acetaminophen] Nausea And Vomiting      Medication List    STOP taking these medications   ibuprofen 800 MG tablet Commonly known as:  ADVIL,MOTRIN   lisinopril-hydrochlorothiazide 20-12.5 MG tablet Commonly known as:  PRINZIDE,ZESTORETIC   potassium chloride SA 20 MEQ tablet Commonly known as:  K-DUR,KLOR-CON   prochlorperazine 10 MG tablet Commonly known as:  COMPAZINE     TAKE these medications   ADVIL PM 200-25 MG Caps Generic drug:  Ibuprofen-Diphenhydramine HCl Take 1 tablet by mouth at bedtime as needed (sleep).   ALPRAZolam 0.5 MG tablet Commonly known as:  XANAX Take 1 tablet (0.5 mg total) by mouth daily as needed for anxiety or sleep.   calcium carbonate (dosed in mg elemental calcium) 1250 MG/5ML Susp Take 10 mLs (1,000 mg of elemental calcium total) by mouth once for 1 dose.   chlorhexidine 0.12 % solution Commonly known as:  PERIDEX 15 mLs by Mouth Rinse route 2 (two) times daily.   dexamethasone 4 MG tablet Commonly known as:  DECADRON Take 2 tablets (8 mg total) by mouth daily. Start the day after chemotherapy for 2 days. Take with food.   diphenhydrAMINE 12.5 MG/5ML elixir Commonly known as:  BENADRYL Take 5 mLs (12.5 mg total) by mouth every 6 (six)  hours as needed for itching.   EPIPEN 2-PAK 0.3 mg/0.3 mL Soaj injection Generic drug:  EPINEPHrine Inject 0.3 mg into the muscle once as needed (anaphylaxis allergic reaction).   fentaNYL 25 MCG/HR patch Commonly known as:  DURAGESIC - dosed mcg/hr Place 1 patch (25 mcg total) onto the skin every 3 (three) days.   fexofenadine 180 MG tablet Commonly known as:  ALLEGRA Take 180 mg by mouth daily as needed (for allergies.).   FLEXERIL PO Take 5 mg by mouth 3 (three) times daily as needed (muscle spasms).   HYDROmorphone 4 MG tablet Commonly known as:  DILAUDID Take 1 tablet (4 mg total) by mouth every 2 (two) hours as needed for severe pain. What changed:    medication strength  how much to take  when to take this   lidocaine-prilocaine cream Commonly known as:  EMLA Apply to affected area once  NON FORMULARY 1 Syringe every 7 (seven) days. Allergy Shot   ondansetron 8 MG tablet Commonly known as:  ZOFRAN Take 1 tablet (8 mg total) by mouth 2 (two) times daily as needed for refractory nausea / vomiting. Start on day 3 after chemotherapy.   senna 8.6 MG Tabs tablet Commonly known as:  SENOKOT Take 2 tablets (17.2 mg total) by mouth 2 (two) times daily.   venlafaxine XR 150 MG 24 hr capsule Commonly known as:  EFFEXOR-XR Take 1 capsule (150 mg total) by mouth daily.      Follow-up Information    Bernerd Limbo, MD. Schedule an appointment as soon as possible for a visit in 1 week(s).   Specialty:  Family Medicine Contact information: [place] [address] [phone]            The results of significant diagnostics from this hospitalization (including imaging, microbiology, ancillary and laboratory) are listed below for reference.    Significant Diagnostic Studies: Mr Lumbar Spine Wo Contrast  Result Date: 04/29/2017 CLINICAL DATA:  Initial evaluation for back pain, buttock pain with weakness. History of metastatic breast  cancer. EXAM: MRI LUMBAR SPINE WITHOUT CONTRAST TECHNIQUE: Multiplanar, multisequence MR imaging of the lumbar spine was performed. No intravenous contrast was administered. COMPARISON:  Prior CT from 04/25/2017. FINDINGS: Segmentation: Study moderately degraded by motion artifact. Additionally, patient was unable to tolerate the full length of the exam. No contrast was administered. Normal segmentation with the lowest well-formed disc labeled the L5-S1 level. Alignment: Normal alignment with preservation of the normal lumbar lordosis. No listhesis. Vertebrae: Bone marrow signal intensity is diffusely abnormal, likely related to diffuse osseous metastases. Vertebral body heights are relatively maintained without evidence for pathologic fracture. Large metastatic implant involving the left aspect of the L4 vertebral body with extension into the left pedicle. Implant measures approximately 5.3 x 4.3 cm in greatest dimensions. Extra osseous extension with tumor in the adjacent paraspinous soft tissues. Epidural tumor present within the ventral and left lateral epidural space at this level (series 7, image 24). Extension into the left L3-4 and L4-5 neural foramina. The Additional metastatic implant involving the right sacral ala demonstrates early invasion into the right S1 neural foramen (series 7, image 38). Small amount of epidural tumor just inferiorly as well (series 7, image 40). No other significant epidural tumor identified on this motion degraded exam. Conus medullaris and cauda equina: Conus extends to the L1 level. Conus and cauda equina appear normal. Paraspinal and other soft tissues: Soft tissue edema within the left paraspinous and psoas musculature adjacent to the left-sided metastasis at the L4 level. Left psoas muscle is displaced anteriorly and laterally. Abnormal edema within the left iliacus insula as well. Scattered left iliac nodes measure up to 2 cm in short axis, likely nodal metastases. Known  hepatic metastases partially visualize. He scattered cystic lesions noted within the kidneys. Disc levels: L1-2:  Unremarkable. L2-3: Diffuse disc bulge. Mild facet hypertrophy. No significant canal stenosis. Mild bilateral L2 foraminal narrowing. L3-4: Diffuse disc bulge with disc desiccation. Tumor involves the left L3-4 neural foramen. Mild spinal stenosis. Moderate to severe left L3 foraminal narrowing due to metastasis. Right neural foramen patent. L4-5: Mild disc bulge. Tumor extends into the left L4-5 neural foramen. Epidural tumor within the ventral and lateral epidural space posterior to the L4 vertebral body. Secondary moderate spinal stenosis. Thecal sac measures 9 mm in AP diameter at its most narrow point. Moderate left L4 foraminal narrowing. No  significant right foraminal encroachment. L5-S1: Diffuse disc bulge with disc desiccation. Epidural lipomatosis. No significant stenosis. IMPRESSION: 1. Limited exam due to patient's inability to tolerate the full length of the study. Additionally, images provided are degraded by motion artifact. 2. Findings consistent with diffuse osseous metastases. 3. Large metastatic implant centered at the left aspect of the L4 with associated paraspinous and epidural extension as above. Tumor extends into the left L3-4 and L4-5 neural foramina. Associated moderate spinal stenosis at the level of L4. 4. Additional metastasis at the right sacral ala with extension into the right S1 neural foramen, likely affecting the transiting right S1 nerve. 5. No associated pathologic fracture at this time. 6. Hepatic and left iliac nodal metastases, partially visualized, better evaluated on recent CT. 7. Mild multilevel degenerative changes as above. Electronically Signed   By: Jeannine Boga M.D.   On: 04/29/2017 00:21   Ct Abdomen Pelvis W Contrast  Result Date: 04/25/2017 CLINICAL DATA:  Urinary retention.History of metastatic disease. EXAM: CT ABDOMEN AND PELVIS WITH  CONTRAST TECHNIQUE: Multidetector CT imaging of the abdomen and pelvis was performed using the standard protocol following bolus administration of intravenous contrast. CONTRAST:  119mL ISOVUE-300 IOPAMIDOL (ISOVUE-300) INJECTION 61% COMPARISON:  Abdominal MRI 03/01/2017.  CT abdomen 08/14/2015. FINDINGS: Lower chest: Heart unremarkable. Innumerable bilateral pulmonary nodules identified. Dominant nodule in the medial left lung base measures 18 x 12 mm on image 17 of series 7. Hepatobiliary: Multiple liver metastases are evident. 4.4 cm lesion in the medial segment left liver appears to be the same lesion that was about 14 mm on the previous MRI. Many of the hepatic lesions appear new in the interval and most of the lesions range in size from 5 mm up to about 14 mm. Gallbladder surgically absent. No intrahepatic or extrahepatic biliary dilation. Pancreas: No focal mass lesion. No dilatation of the main duct. No intraparenchymal cyst. No peripancreatic edema. Spleen: No splenomegaly. No focal mass lesion. Adrenals/Urinary Tract: No adrenal nodule or mass. Kidneys unremarkable. No evidence for hydroureter. Foley catheter decompresses the urinary bladder. Stomach/Bowel: Stomach is nondistended. No gastric wall thickening. No evidence of outlet obstruction. Duodenum is normally positioned as is the ligament of Treitz. No small bowel wall thickening. No small bowel dilatation. The terminal ileum is normal. The appendix is normal. No gross colonic mass. No colonic wall thickening. No substantial diverticular change. Vascular/Lymphatic: No abdominal aortic aneurysm. No abdominal aortic atherosclerotic calcification. There is no gastrohepatic or hepatoduodenal ligament lymphadenopathy. No intraperitoneal or retroperitoneal lymphadenopathy. 11 mm short axis necrotic left common iliac lymph nodes seen image 38 series 3. 19 mm necrotic left iliac lymph node visible on image 46. Reproductive: Uterus surgically absent.  There is  no adnexal mass. Other: No intraperitoneal free fluid. Musculoskeletal: Lytic bone lesions are seen scattered through the thoracolumbar spine and bony pelvis. IMPRESSION: 1. Metastatic disease in the liver has progressed since MRI of 03/01/2017. 2. Necrotic left iliac lymphadenopathy compatible with metastatic disease. 3. Innumerable pulmonary nodules consistent with metastatic involvement. 4. Diffuse lytic bone metastases in the thoracolumbar spine and bony pelvis. 5. Foley catheter decompresses the urinary bladder. Electronically Signed   By: Misty Stanley M.D.   On: 04/25/2017 11:20   Dg Chest Portable 1 View  Result Date: 04/25/2017 CLINICAL DATA:  Initial evaluation for acute weakness. History of breast cancer. EXAM: PORTABLE CHEST 1 VIEW COMPARISON:  Prior radiograph from 04/06/2017. FINDINGS: Right-sided Port-A-Cath in place, stable. Cardiac and mediastinal silhouettes are stable in size and contour, and  remain within normal limits. Lungs are mildly hypoinflated. No focal infiltrates. No pulmonary edema or pleural effusion. No pneumothorax. No acute osseus abnormality. IMPRESSION: No active cardiopulmonary disease. Electronically Signed   By: Jeannine Boga M.D.   On: 04/25/2017 06:32   Dg Chest Port 1 View  Result Date: 04/06/2017 CLINICAL DATA:  Port catheter placement. EXAM: PORTABLE CHEST 1 VIEW COMPARISON:  MRI abdomen dated March 01, 2017. Chest x-ray dated January 24, 2016. FINDINGS: Right port catheter with the tip projecting over the proximal SVC. The cardiomediastinal silhouette is normal in size. Normal pulmonary vascularity. Increased reticulonodular interstitial lung markings in both lung bases. No pleural effusion or pneumothorax. No acute osseous abnormality. IMPRESSION: 1. Right port catheter with tip projecting over the proximal SVC. No pneumothorax. 2. Increased reticulonodular densities in both lower lobes, consistent with known pulmonary metastatic disease.  Electronically Signed   By: Titus Dubin M.D.   On: 04/06/2017 09:26   Dg Fluoro Guide Cv Line-no Report  Result Date: 04/06/2017 Fluoroscopy was utilized by the requesting physician.  No radiographic interpretation.   Dg Hip Unilat With Pelvis 2-3 Views Left  Result Date: 04/27/2017 CLINICAL DATA:  Acute onset of severe left hip pain which began this morning. No known injury. Current history of stage IV breast cancer for which the patient receiving chemotherapy. EXAM: DG HIP (WITH OR WITHOUT PELVIS) 2-3V LEFT COMPARISON:  None. FINDINGS: No evidence of acute fracture or dislocation. Well preserved joint space. Well preserved bone mineral density. No intrinsic osseous abnormality. Included AP pelvis demonstrates a normal-appearing contralateral right hip. Sacroiliac joints and symphysis pubis intact. Visualized lower lumbar spine unremarkable. Opaque material is present external to the patient overlying the inner thigh. IMPRESSION: Normal examination. Electronically Signed   By: Evangeline Dakin M.D.   On: 04/27/2017 20:00    Microbiology: Recent Results (from the past 240 hour(s))  Culture, blood (Routine x 2)     Status: None   Collection Time: 04/25/17  5:57 AM  Result Value Ref Range Status   Specimen Description BLOOD PORTA CATH  Final   Special Requests   Final    BOTTLES DRAWN AEROBIC AND ANAEROBIC Blood Culture adequate volume   Culture   Final    NO GROWTH 5 DAYS Performed at [facility], [address], [place], [place] [address]    Report Status 04/30/2017 FINAL  Final  Urine C&S     Status: None   Collection Time: 04/25/17  8:37 AM  Result Value Ref Range Status   Specimen Description URINE, CLEAN CATCH  Final   Special Requests Immunocompromised  Final   Culture   Final    NO GROWTH Performed at [facility], [place] [address], [place],  [address]    Report Status 04/26/2017 FINAL  Final     Labs: Basic Metabolic Panel: Recent Labs  Lab  04/27/17 0346  04/29/17 1130 04/30/17 0426 05/01/17 0544 05/02/17 0326 05/03/17 0432  NA 140   < > 138 137 135 137 137  K 4.2   < > 5.1 4.4 3.8 3.5 3.5  CL 111   < > 110 107 104 105 104  CO2 23   < > 21* 21* 20* 21* 23  GLUCOSE 72   < > 76 76 83 88 100*  BUN <5*   < > 6 7 6 6 6   CREATININE 0.58   < > 0.46 0.50 0.45 0.53 0.53  CALCIUM 5.9*   < > 7.5*  7.7* 7.5* 7.5* 7.6*  MG 2.0  --   --   --   --  1.3*  --    < > = values in this interval not displayed.   Liver Function Tests: Recent Labs  Lab 04/26/17 1021  AST 117*  ALT 65*  ALKPHOS 42  BILITOT 1.3*  PROT 6.2*  ALBUMIN 3.3*   No results for input(s): LIPASE, AMYLASE in the last 168 hours. No results for input(s): AMMONIA in the last 168 hours. CBC: Recent Labs  Lab 04/27/17 0930 04/28/17 0342 04/29/17 0321 04/29/17 0810 04/30/17 0426 04/30/17 1540 05/02/17 0326 05/03/17 0432  WBC 7.7 6.4 7.4  --  5.1 5.0 5.2 5.1  NEUTROABS 5.7 5.9 5.8  --  4.1  --   --   --   HGB 8.4* 7.9* 5.7* 8.0* 7.9* 8.0* 7.8* 8.0*  HCT 25.3* 24.5* 17.3* 24.5* 24.5* 24.5* 23.7* 23.7*  MCV 86.1 87.2 87.4  --  87.8 87.2 86.8 86.5  PLT 149* 118* 134*  --  99* 105* 123* 152   Cardiac Enzymes: No results for input(s): CKTOTAL, CKMB, CKMBINDEX, TROPONINI in the last 168 hours. BNP: BNP (last 3 results) No results for input(s): BNP in the last 8760 hours.  ProBNP (last 3 results) No results for input(s): PROBNP in the last 8760 hours.  CBG: No results for input(s): GLUCAP in the last 168 hours.

## 2017-05-03 NOTE — Telephone Encounter (Signed)
Per Dr. Lisbeth Renshaw indwelling catheter is fine with him, doesn't interfere with radiation, Pam RN informed 9:48 AM

## 2017-05-03 NOTE — Progress Notes (Signed)
Spoke with [facility] rep, confirmed [facility] orders. They will f/u post discharge. [phone]

## 2017-05-04 ENCOUNTER — Ambulatory Visit
Admission: RE | Admit: 2017-05-04 | Discharge: 2017-05-04 | Disposition: A | Discharge status: Home / Self Care | Payer: BLUE CROSS/BLUE SHIELD | Source: Ambulatory Visit | Attending: Radiation Oncology | Admitting: Radiation Oncology

## 2017-05-04 ENCOUNTER — Encounter: Payer: Self-pay | Admitting: Adult Health

## 2017-05-04 DIAGNOSIS — Z90710 Acquired absence of both cervix and uterus: Secondary | ICD-10-CM | POA: Insufficient documentation

## 2017-05-04 DIAGNOSIS — C50411 Malignant neoplasm of upper-outer quadrant of right female breast: Secondary | ICD-10-CM | POA: Insufficient documentation

## 2017-05-04 DIAGNOSIS — Z8249 Family history of ischemic heart disease and other diseases of the circulatory system: Secondary | ICD-10-CM | POA: Diagnosis not present

## 2017-05-04 DIAGNOSIS — Z79891 Long term (current) use of opiate analgesic: Secondary | ICD-10-CM | POA: Insufficient documentation

## 2017-05-04 DIAGNOSIS — C50511 Malignant neoplasm of lower-outer quadrant of right female breast: Secondary | ICD-10-CM | POA: Insufficient documentation

## 2017-05-04 DIAGNOSIS — Z888 Allergy status to other drugs, medicaments and biological substances status: Secondary | ICD-10-CM | POA: Diagnosis not present

## 2017-05-04 DIAGNOSIS — G893 Neoplasm related pain (acute) (chronic): Secondary | ICD-10-CM | POA: Diagnosis not present

## 2017-05-04 DIAGNOSIS — D6489 Other specified anemias: Secondary | ICD-10-CM | POA: Diagnosis not present

## 2017-05-04 DIAGNOSIS — B379 Candidiasis, unspecified: Secondary | ICD-10-CM | POA: Diagnosis not present

## 2017-05-04 DIAGNOSIS — M1990 Unspecified osteoarthritis, unspecified site: Secondary | ICD-10-CM | POA: Insufficient documentation

## 2017-05-04 DIAGNOSIS — Z510 Encounter for antineoplastic radiation therapy: Secondary | ICD-10-CM | POA: Insufficient documentation

## 2017-05-04 DIAGNOSIS — Z885 Allergy status to narcotic agent status: Secondary | ICD-10-CM | POA: Diagnosis not present

## 2017-05-04 DIAGNOSIS — I10 Essential (primary) hypertension: Secondary | ICD-10-CM | POA: Diagnosis not present

## 2017-05-04 DIAGNOSIS — Z791 Long term (current) use of non-steroidal anti-inflammatories (NSAID): Secondary | ICD-10-CM | POA: Insufficient documentation

## 2017-05-04 DIAGNOSIS — Z79899 Other long term (current) drug therapy: Secondary | ICD-10-CM | POA: Insufficient documentation

## 2017-05-04 DIAGNOSIS — Z803 Family history of malignant neoplasm of breast: Secondary | ICD-10-CM | POA: Insufficient documentation

## 2017-05-04 DIAGNOSIS — E876 Hypokalemia: Secondary | ICD-10-CM | POA: Diagnosis not present

## 2017-05-04 DIAGNOSIS — Z9889 Other specified postprocedural states: Secondary | ICD-10-CM | POA: Diagnosis not present

## 2017-05-04 DIAGNOSIS — R3911 Hesitancy of micturition: Secondary | ICD-10-CM | POA: Diagnosis not present

## 2017-05-04 DIAGNOSIS — F329 Major depressive disorder, single episode, unspecified: Secondary | ICD-10-CM | POA: Insufficient documentation

## 2017-05-05 ENCOUNTER — Telehealth: Payer: Self-pay | Admitting: *Deleted

## 2017-05-05 ENCOUNTER — Ambulatory Visit
Admission: RE | Admit: 2017-05-05 | Discharge: 2017-05-05 | Disposition: A | Discharge status: Home / Self Care | Payer: BLUE CROSS/BLUE SHIELD | Source: Ambulatory Visit | Attending: Radiation Oncology | Admitting: Radiation Oncology

## 2017-05-05 DIAGNOSIS — Z510 Encounter for antineoplastic radiation therapy: Secondary | ICD-10-CM | POA: Diagnosis not present

## 2017-05-05 DIAGNOSIS — Z171 Estrogen receptor negative status [ER-]: Principal | ICD-10-CM

## 2017-05-05 DIAGNOSIS — C50411 Malignant neoplasm of upper-outer quadrant of right female breast: Secondary | ICD-10-CM

## 2017-05-05 MED ORDER — HYDROMORPHONE HCL 4 MG PO TABS: 4.0000 mg | ORAL_TABLET | ORAL | 0 refills | Status: DC | PRN

## 2017-05-05 NOTE — Telephone Encounter (Signed)
This RN contacted pt's husband per email noted as sent yesterday at 1802 - addressed to LCC/NP.  Email inquiry was related to his concern that he had not heard from the home health agency since pt was discharged.  Mr Pepperman states he did receive a call from [facility] stating they are awaiting approval per insurance to resume services. He states more then likely once approval obtained they would not be able to see patient until Monday 12/31.  Per this RN's inquiry - Mr Chumney states no current issues " she just has this catheter and I want to make sure it doesn't get infected "  Mr Dado verified above is her foley catheter. This RN discussed with pt what he should expect as well as symptoms that would be of concern for an infection.  No further needs at this time. Mr Reber understands to call this RN during office hours if needed.

## 2017-05-06 NOTE — Progress Notes (Signed)
  Radiation Oncology         [phone] ________________________________  Name: Lindsay [name] MRN: [id number]  Date: 05/01/2017  DOB: Sep 15, 1965  SIMULATION AND TREATMENT PLANNING NOTE  DIAGNOSIS:     ICD-10-CM   1. Bone metastases (HCC) C79.51   2. Carcinoma of breast metastatic to bone, unspecified laterality ([place]) C50.919    C79.51      Site:  Lumbar spine/sacrum  NARRATIVE:  The patient was brought to the [facility].  Identity was confirmed.  All relevant records and images related to the planned course of therapy were reviewed.   Written consent to proceed with treatment was confirmed which was freely given after reviewing the details related to the planned course of therapy had been reviewed with the patient.  Then, the patient was set-up in a stable reproducible  supine position for radiation therapy.  CT images were obtained.  Surface markings were placed.    Medically necessary complex treatment device(s) for immobilization:  Customized vac lock bag.   The CT images were loaded into the planning software.  Then the target and avoidance structures were contoured.  Treatment planning then occurred.  The radiation prescription was entered and confirmed.  A total of 4 complex treatment devices were fabricated which relate to the designed radiation treatment fields. Each of these customized fields/ complex treatment devices will be used on a daily basis during the radiation course. I have requested : 3D Simulation  I have requested a DVH of the following structures: Target volume, bowel, left kidney, right kidney.   PLAN:  The patient will receive 4 Gy in one fraction initially. The patient then will continue the same plan, with the same for customized fields, for an additional 9 fractions at 3 gray per fraction. The final total dose therefore will be 31 gray..  ________________________________   Jodelle Gross, MD, PhD

## 2017-05-08 ENCOUNTER — Ambulatory Visit
Admission: RE | Admit: 2017-05-08 | Discharge: 2017-05-08 | Disposition: A | Discharge status: Home / Self Care | Payer: BLUE CROSS/BLUE SHIELD | Source: Ambulatory Visit | Attending: Radiation Oncology | Admitting: Radiation Oncology

## 2017-05-08 ENCOUNTER — Other Ambulatory Visit: Payer: Self-pay | Admitting: *Deleted

## 2017-05-08 ENCOUNTER — Telehealth: Payer: Self-pay | Admitting: *Deleted

## 2017-05-08 DIAGNOSIS — Z510 Encounter for antineoplastic radiation therapy: Secondary | ICD-10-CM | POA: Diagnosis not present

## 2017-05-08 NOTE — Telephone Encounter (Signed)
This RN received VM from [facility] with [facility] requesting a return call to verify Dr Jannifer Rodney will be attending for PT home visits starting 05/10/2016.  Return call number given [phone].  This RN attempt to return call at 455 pm and received on call service per office closed for the day at this time.  Attempt will be made for contact upon return to office on 05/10/2017.  Note request above stated " PT " - pt needs home nursing for review of medications , ongoing pain management, assessment and maintenance for foley catheter. Social work consult is also needed per dynamics of care and family needing additional resources for assistance.  Above can be inquired upon contact on 05/10/2017.

## 2017-05-08 NOTE — Telephone Encounter (Signed)
See prior note per home health issues.  This RN spoke with pt and husband post her radiation appointment.  Note pt was sitting in wheelchair, stated pain was better under control. Darian also verified she does want to proceed with therapy to decrease tumor burden and hopefully gain better function with control of disease.   [name] [place] states he did receive a call from [facility] " but I don't know what she said - she was talking in circles and said they would be doing therapy but not helping me with her "  [name] Pritchett states he is having to lift her, assist her to [facility] " and it is really hard on me after working an 8 hour shift ".  [name] [place] states he does have Katherine's mom who is helping him " but she doesn't want other people helping so..."  [name] [place] stated " some people told me that I should have kept Tamika in the hospital since she was having treatment every day"  This RN discussed with [name] Brecon that Nanea d/c was based on medical issues - and at time of discharge if family could not meet her needs that likely she would have been d/ced to a skilled facility. This RN discussed issues of assisting patient with ADL is not usually covered by home health - but either by family or friends or arranging with an agency for pay that would be out of pocket.  Presently [name] [place] states he will be able to care for the patient with her current needs since home health will not be able to visit until Wed or Thursday of this week. Per discussion [name] [place] understands that if he feels at any time he or his mother in law are unable to provide pt's needs and patient is at risk for injury they would need to proceed to the ER.  Per end of conversation including this RN inquiring with Hayslee if she has any additional concerns or issues that need to be addressed including refills - she stated " No , I am good "

## 2017-05-08 NOTE — Telephone Encounter (Addendum)
This RN received call from Amy, [facility] stating pt's husband called requesting services - would Dr Jana Hakim be attending.  This RN discussed above with pt being d/ced 05/03/2017 with note per Case Manager stating pt will be followed by [facility] home health.  This RN proceeded to contact pt's husband [name] [place] who stated " I just need some help - I have to do everything "   " she cannot stand alone, or even sit up ". " she has this catheter ( foley ) " . " a nurse came out from [place] but only asked a few questions and then left - I don't know if they will be coming back "  Discussed hospice services - and current plan of therapy with radiation and planned chemo - with [name] [place] stating " yes that is what I want for her " verifying that he means continuing current plan of active treatment.  Per conversation - [name] [place] understands hospice services are not appropriate at this time - but a referral for palliative care can be made for additional resources.  [name] [place] states he is planning on bringing [place] in for her radiation treatment today -with this RN requesting him to come see this RN post radiation for follow up on current status and further communication with home health.  This RN then contacted [facility] at [phone] and spoke with Jenny Reichmann - she states referral and contact per d/c " was pt would be under hospice services therefore we d/ced services "  This RN reviewed chart notes including note from casemanager stating " [facility] will [facility] " as well as Dr Gerarda Fraction note stating plan of therapy is for radiation and then chemo on 1/10 .  Jenny Reichmann states they can resume services but will not be able to make visit until 1/2 or 1/3 this week.  This RN discussed husband's concerns especially since a home nurse has not seen the patient upon discharge.  Jenny Reichmann states she will contact [place] and speak with him per above.  This RN also contacted Chief Technology Officer in [employer] - and informed her of above. She will  alert tech and inquire if any concerns noticed that may need further addressing or referral to the ER.  This RN then called Amy at [facility] and made formal referral for Palliative services.  Note time per interaction - 60 minutes.

## 2017-05-10 ENCOUNTER — Telehealth: Payer: Self-pay

## 2017-05-10 ENCOUNTER — Ambulatory Visit
Admission: RE | Admit: 2017-05-10 | Discharge: 2017-05-10 | Disposition: A | Discharge status: Home / Self Care | Payer: BLUE CROSS/BLUE SHIELD | Source: Ambulatory Visit | Attending: Radiation Oncology | Admitting: Radiation Oncology

## 2017-05-10 DIAGNOSIS — Z510 Encounter for antineoplastic radiation therapy: Secondary | ICD-10-CM | POA: Diagnosis not present

## 2017-05-10 NOTE — Telephone Encounter (Signed)
Spoke with Varney Biles from [facility] to verify that Dr.Magrinat will be the attending for pt. Also, confirmed that pt will be seen by [facility] today. [place] Getting verbal order for PT/OT, Home health nursing for medication management, assessment and foley care, as well as SW consult for further needs.   Notified Varney Biles that pt husband and mother has some concerns about caring for pt at home and need to be addressed. Varney Biles to call call cancer center with any follow up, concerns or updates.

## 2017-05-11 ENCOUNTER — Ambulatory Visit: Payer: BLUE CROSS/BLUE SHIELD

## 2017-05-12 ENCOUNTER — Ambulatory Visit
Admission: RE | Admit: 2017-05-12 | Discharge: 2017-05-12 | Disposition: A | Discharge status: Home / Self Care | Payer: BLUE CROSS/BLUE SHIELD | Source: Ambulatory Visit | Attending: Radiation Oncology | Admitting: Radiation Oncology

## 2017-05-12 DIAGNOSIS — Z510 Encounter for antineoplastic radiation therapy: Secondary | ICD-10-CM | POA: Diagnosis not present

## 2017-05-15 ENCOUNTER — Ambulatory Visit
Admission: RE | Admit: 2017-05-15 | Discharge: 2017-05-15 | Disposition: A | Discharge status: Home / Self Care | Payer: BLUE CROSS/BLUE SHIELD | Source: Ambulatory Visit | Attending: Radiation Oncology | Admitting: Radiation Oncology

## 2017-05-15 DIAGNOSIS — Z510 Encounter for antineoplastic radiation therapy: Secondary | ICD-10-CM | POA: Diagnosis not present

## 2017-05-16 ENCOUNTER — Telehealth: Payer: Self-pay | Admitting: *Deleted

## 2017-05-16 ENCOUNTER — Ambulatory Visit: Payer: BLUE CROSS/BLUE SHIELD

## 2017-05-16 ENCOUNTER — Ambulatory Visit
Admission: RE | Admit: 2017-05-16 | Discharge: 2017-05-16 | Disposition: A | Discharge status: Home / Self Care | Payer: BLUE CROSS/BLUE SHIELD | Source: Ambulatory Visit | Attending: Radiation Oncology | Admitting: Radiation Oncology

## 2017-05-16 DIAGNOSIS — Z510 Encounter for antineoplastic radiation therapy: Secondary | ICD-10-CM | POA: Diagnosis not present

## 2017-05-16 NOTE — Telephone Encounter (Signed)
This RN spoke wit pt's husband - [name] [place] per his call inquiring about appointments scheduled on 05/18/2017- " I wasn't aware that she was to get chemo and then on my chart it stated she is scheduled on the 10th " - " she is still getting radiation therapy "  This RN discussed above including radiation is for local tumor on spine - pt needs systemic chemo for other areas of cancer- treatment may be modified if needed per radiation but that can be discussed at visit on 1/10 prior to treatment.  [name] [place] also states Lela would like the foley catheter removed - states pt feels she is " urinating on her own thru the foley she feels she can get up to the bathroom on her own."  This RN informed [name] [place] above order will be called to [facility] -   This RN contacted [facility] and spoke with Earnest Rosier. Discussed above with order given to remove catheter and may re- insert if needed.  This message will be sent to provider for review of communication - no further needs at this time.

## 2017-05-17 ENCOUNTER — Encounter: Payer: Self-pay | Admitting: Radiation Oncology

## 2017-05-17 ENCOUNTER — Ambulatory Visit
Admission: RE | Admit: 2017-05-17 | Discharge: 2017-05-17 | Disposition: A | Discharge status: Home / Self Care | Payer: BLUE CROSS/BLUE SHIELD | Source: Ambulatory Visit | Attending: Radiation Oncology | Admitting: Radiation Oncology

## 2017-05-17 DIAGNOSIS — Z510 Encounter for antineoplastic radiation therapy: Secondary | ICD-10-CM | POA: Diagnosis not present

## 2017-05-18 ENCOUNTER — Encounter: Payer: Self-pay | Admitting: Adult Health

## 2017-05-18 ENCOUNTER — Other Ambulatory Visit: Payer: Self-pay | Admitting: Medical

## 2017-05-18 ENCOUNTER — Inpatient Hospital Stay: Payer: BLUE CROSS/BLUE SHIELD

## 2017-05-18 ENCOUNTER — Inpatient Hospital Stay (HOSPITAL_BASED_OUTPATIENT_CLINIC_OR_DEPARTMENT_OTHER): Payer: BLUE CROSS/BLUE SHIELD | Admitting: Adult Health

## 2017-05-18 ENCOUNTER — Inpatient Hospital Stay: Payer: BLUE CROSS/BLUE SHIELD | Attending: Adult Health

## 2017-05-18 ENCOUNTER — Encounter (HOSPITAL_COMMUNITY): Payer: Self-pay | Admitting: Family Medicine

## 2017-05-18 ENCOUNTER — Other Ambulatory Visit: Payer: Self-pay

## 2017-05-18 ENCOUNTER — Telehealth: Payer: Self-pay | Admitting: Adult Health

## 2017-05-18 ENCOUNTER — Other Ambulatory Visit: Payer: BLUE CROSS/BLUE SHIELD

## 2017-05-18 ENCOUNTER — Encounter: Payer: Self-pay | Admitting: General Practice

## 2017-05-18 ENCOUNTER — Observation Stay (HOSPITAL_COMMUNITY)
Admission: AD | Admit: 2017-05-18 | Discharge: 2017-05-19 | Disposition: A | Discharge status: Home / Self Care | Payer: BLUE CROSS/BLUE SHIELD | Source: Ambulatory Visit | Attending: Family Medicine | Admitting: Family Medicine

## 2017-05-18 VITALS — BP 106/68 | HR 130 | Temp 99.0°F | Resp 20

## 2017-05-18 DIAGNOSIS — C7800 Secondary malignant neoplasm of unspecified lung: Secondary | ICD-10-CM | POA: Insufficient documentation

## 2017-05-18 DIAGNOSIS — R630 Anorexia: Secondary | ICD-10-CM | POA: Insufficient documentation

## 2017-05-18 DIAGNOSIS — D638 Anemia in other chronic diseases classified elsewhere: Secondary | ICD-10-CM | POA: Diagnosis not present

## 2017-05-18 DIAGNOSIS — R52 Pain, unspecified: Secondary | ICD-10-CM

## 2017-05-18 DIAGNOSIS — Z9221 Personal history of antineoplastic chemotherapy: Secondary | ICD-10-CM

## 2017-05-18 DIAGNOSIS — I10 Essential (primary) hypertension: Secondary | ICD-10-CM | POA: Insufficient documentation

## 2017-05-18 DIAGNOSIS — B379 Candidiasis, unspecified: Secondary | ICD-10-CM | POA: Diagnosis not present

## 2017-05-18 DIAGNOSIS — D696 Thrombocytopenia, unspecified: Secondary | ICD-10-CM | POA: Insufficient documentation

## 2017-05-18 DIAGNOSIS — Z90710 Acquired absence of both cervix and uterus: Secondary | ICD-10-CM

## 2017-05-18 DIAGNOSIS — Z79899 Other long term (current) drug therapy: Secondary | ICD-10-CM | POA: Insufficient documentation

## 2017-05-18 DIAGNOSIS — E876 Hypokalemia: Secondary | ICD-10-CM | POA: Diagnosis not present

## 2017-05-18 DIAGNOSIS — Z803 Family history of malignant neoplasm of breast: Secondary | ICD-10-CM | POA: Insufficient documentation

## 2017-05-18 DIAGNOSIS — E860 Dehydration: Secondary | ICD-10-CM | POA: Diagnosis not present

## 2017-05-18 DIAGNOSIS — R5383 Other fatigue: Secondary | ICD-10-CM | POA: Diagnosis not present

## 2017-05-18 DIAGNOSIS — C7951 Secondary malignant neoplasm of bone: Secondary | ICD-10-CM

## 2017-05-18 DIAGNOSIS — B370 Candidal stomatitis: Secondary | ICD-10-CM | POA: Diagnosis present

## 2017-05-18 DIAGNOSIS — D61818 Other pancytopenia: Secondary | ICD-10-CM | POA: Insufficient documentation

## 2017-05-18 DIAGNOSIS — C787 Secondary malignant neoplasm of liver and intrahepatic bile duct: Secondary | ICD-10-CM | POA: Insufficient documentation

## 2017-05-18 DIAGNOSIS — D649 Anemia, unspecified: Secondary | ICD-10-CM | POA: Diagnosis present

## 2017-05-18 DIAGNOSIS — Z885 Allergy status to narcotic agent status: Secondary | ICD-10-CM | POA: Insufficient documentation

## 2017-05-18 DIAGNOSIS — Z888 Allergy status to other drugs, medicaments and biological substances status: Secondary | ICD-10-CM | POA: Insufficient documentation

## 2017-05-18 DIAGNOSIS — F32A Depression, unspecified: Secondary | ICD-10-CM | POA: Diagnosis present

## 2017-05-18 DIAGNOSIS — C50411 Malignant neoplasm of upper-outer quadrant of right female breast: Secondary | ICD-10-CM

## 2017-05-18 DIAGNOSIS — R112 Nausea with vomiting, unspecified: Secondary | ICD-10-CM | POA: Diagnosis not present

## 2017-05-18 DIAGNOSIS — R0602 Shortness of breath: Secondary | ICD-10-CM

## 2017-05-18 DIAGNOSIS — R197 Diarrhea, unspecified: Secondary | ICD-10-CM | POA: Diagnosis present

## 2017-05-18 DIAGNOSIS — R5381 Other malaise: Secondary | ICD-10-CM | POA: Insufficient documentation

## 2017-05-18 DIAGNOSIS — Z171 Estrogen receptor negative status [ER-]: Secondary | ICD-10-CM

## 2017-05-18 DIAGNOSIS — R339 Retention of urine, unspecified: Secondary | ICD-10-CM | POA: Insufficient documentation

## 2017-05-18 DIAGNOSIS — F329 Major depressive disorder, single episode, unspecified: Secondary | ICD-10-CM | POA: Diagnosis not present

## 2017-05-18 DIAGNOSIS — R42 Dizziness and giddiness: Secondary | ICD-10-CM

## 2017-05-18 DIAGNOSIS — F418 Other specified anxiety disorders: Secondary | ICD-10-CM | POA: Diagnosis not present

## 2017-05-18 DIAGNOSIS — M25552 Pain in left hip: Secondary | ICD-10-CM | POA: Diagnosis not present

## 2017-05-18 DIAGNOSIS — R531 Weakness: Secondary | ICD-10-CM | POA: Diagnosis not present

## 2017-05-18 DIAGNOSIS — E8809 Other disorders of plasma-protein metabolism, not elsewhere classified: Secondary | ICD-10-CM | POA: Diagnosis not present

## 2017-05-18 LAB — COMPREHENSIVE METABOLIC PANEL
ALT: 33 U/L (ref 0–55)
AST: 118 U/L — ABNORMAL HIGH (ref 5–34)
Albumin: 2.8 g/dL — ABNORMAL LOW (ref 3.5–5.0)
Alkaline Phosphatase: 41 U/L (ref 40–150)
Anion gap: 16 — ABNORMAL HIGH (ref 3–11)
BUN: 12 mg/dL (ref 7–26)
CHLORIDE: 95 mmol/L — AB (ref 98–109)
CO2: 28 mmol/L (ref 22–29)
Calcium: 6.4 mg/dL — CL (ref 8.4–10.4)
Creatinine, Ser: 0.79 mg/dL (ref 0.60–1.10)
Glucose, Bld: 122 mg/dL (ref 70–140)
POTASSIUM: 2.6 mmol/L — AB (ref 3.3–4.7)
SODIUM: 139 mmol/L (ref 136–145)
Total Bilirubin: 1.5 mg/dL — ABNORMAL HIGH (ref 0.2–1.2)
Total Protein: 6.6 g/dL (ref 6.4–8.3)

## 2017-05-18 LAB — CBC WITH DIFFERENTIAL/PLATELET
Basophils Absolute: 0 10*3/uL (ref 0.0–0.1)
Basophils Relative: 1 %
Eosinophils Absolute: 0.1 10*3/uL (ref 0.0–0.5)
Eosinophils Relative: 3 %
HEMATOCRIT: 25.4 % — AB (ref 34.8–46.6)
HEMOGLOBIN: 8 g/dL — AB (ref 11.6–15.9)
LYMPHS ABS: 0.2 10*3/uL — AB (ref 0.9–3.3)
Lymphocytes Relative: 7 %
MCH: 27.8 pg (ref 25.1–34.0)
MCHC: 31.5 g/dL (ref 31.5–36.0)
MCV: 88.2 fL (ref 79.5–101.0)
Monocytes Absolute: 0.2 10*3/uL (ref 0.1–0.9)
Monocytes Relative: 8 %
NEUTROS ABS: 2.3 10*3/uL (ref 1.5–6.5)
NEUTROS PCT: 81 %
Platelets: 59 10*3/uL — ABNORMAL LOW (ref 145–400)
RBC: 2.88 MIL/uL — AB (ref 3.70–5.45)
RDW: 16.7 % — ABNORMAL HIGH (ref 11.2–16.1)
WBC: 2.8 10*3/uL — AB (ref 3.9–10.3)

## 2017-05-18 LAB — PREPARE RBC (CROSSMATCH)

## 2017-05-18 LAB — ABO/RH: ABO/RH(D): O POS

## 2017-05-18 LAB — C DIFFICILE QUICK SCREEN W PCR REFLEX
C DIFFICLE (CDIFF) ANTIGEN: NEGATIVE
C Diff interpretation: NOT DETECTED
C Diff toxin: NEGATIVE

## 2017-05-18 MED ORDER — DIPHENHYDRAMINE HCL 25 MG PO CAPS
ORAL_CAPSULE | ORAL | Status: AC
  Filled 2017-05-18: qty 1

## 2017-05-18 MED ORDER — FLUCONAZOLE 200 MG PO TABS: 200.0000 mg | ORAL_TABLET | Freq: Every day | ORAL | 1 refills | Status: DC

## 2017-05-18 MED ORDER — VENLAFAXINE HCL ER 150 MG PO CP24
150.0000 mg | ORAL_CAPSULE | Freq: Every day | ORAL | Status: DC
  Administered 2017-05-19: 150 mg via ORAL
  Filled 2017-05-18: qty 1

## 2017-05-18 MED ORDER — ONDANSETRON HCL 4 MG/2ML IJ SOLN: 4.0000 mg | Freq: Four times a day (QID) | INTRAMUSCULAR | Status: DC | PRN

## 2017-05-18 MED ORDER — SODIUM CHLORIDE 0.9 % IV SOLN
Freq: Once | INTRAVENOUS | Status: DC
  Administered 2017-05-18: 12:00:00 via INTRAVENOUS
  Filled 2017-05-18: qty 4

## 2017-05-18 MED ORDER — DIPHENHYDRAMINE HCL 25 MG PO CAPS
25.0000 mg | ORAL_CAPSULE | Freq: Once | ORAL | Status: AC
  Administered 2017-05-18: 25 mg via ORAL

## 2017-05-18 MED ORDER — ONDANSETRON HCL 4 MG/2ML IJ SOLN
8.0000 mg | Freq: Once | INTRAMUSCULAR | Status: AC
  Administered 2017-05-18: 8 mg via INTRAVENOUS

## 2017-05-18 MED ORDER — HYDROMORPHONE HCL 4 MG PO TABS: 4.0000 mg | ORAL_TABLET | ORAL | 0 refills | Status: DC | PRN

## 2017-05-18 MED ORDER — ONDANSETRON HCL 4 MG/2ML IJ SOLN
INTRAMUSCULAR | Status: AC
  Filled 2017-05-18: qty 4

## 2017-05-18 MED ORDER — FENTANYL 25 MCG/HR TD PT72
25.0000 ug | MEDICATED_PATCH | TRANSDERMAL | Status: DC
  Administered 2017-05-19: 25 ug via TRANSDERMAL
  Filled 2017-05-18: qty 1

## 2017-05-18 MED ORDER — SODIUM CHLORIDE 0.9% FLUSH: 3.0000 mL | INTRAVENOUS | Status: DC | PRN

## 2017-05-18 MED ORDER — CALCIUM CARBONATE 1250 (500 CA) MG PO TABS
1.0000 | ORAL_TABLET | Freq: Two times a day (BID) | ORAL | Status: DC
  Administered 2017-05-19: 500 mg via ORAL
  Filled 2017-05-18: qty 1

## 2017-05-18 MED ORDER — ADULT MULTIVITAMIN W/MINERALS CH
1.0000 | ORAL_TABLET | Freq: Every day | ORAL | Status: DC
  Administered 2017-05-19: 1 via ORAL
  Filled 2017-05-18: qty 1

## 2017-05-18 MED ORDER — FLUCONAZOLE 100 MG PO TABS
200.0000 mg | ORAL_TABLET | Freq: Every day | ORAL | Status: DC
  Administered 2017-05-18 – 2017-05-19 (×2): 200 mg via ORAL
  Filled 2017-05-18: qty 2

## 2017-05-18 MED ORDER — SODIUM CHLORIDE 0.9 % IV SOLN: Freq: Once | INTRAVENOUS | Status: DC

## 2017-05-18 MED ORDER — ACETAMINOPHEN 325 MG PO TABS
650.0000 mg | ORAL_TABLET | Freq: Once | ORAL | Status: AC
  Administered 2017-05-18: 650 mg via ORAL

## 2017-05-18 MED ORDER — CHLORHEXIDINE GLUCONATE 0.12 % MT SOLN
15.0000 mL | Freq: Two times a day (BID) | OROMUCOSAL | Status: DC
  Administered 2017-05-18 – 2017-05-19 (×2): 15 mL via OROMUCOSAL
  Filled 2017-05-18 (×2): qty 15

## 2017-05-18 MED ORDER — ACETAMINOPHEN 650 MG RE SUPP: 650.0000 mg | Freq: Four times a day (QID) | RECTAL | Status: DC | PRN

## 2017-05-18 MED ORDER — SACCHAROMYCES BOULARDII 250 MG PO CAPS
250.0000 mg | ORAL_CAPSULE | Freq: Two times a day (BID) | ORAL | Status: DC
  Administered 2017-05-18 – 2017-05-19 (×2): 250 mg via ORAL
  Filled 2017-05-18 (×2): qty 1

## 2017-05-18 MED ORDER — ACETAMINOPHEN 325 MG PO TABS: 650.0000 mg | ORAL_TABLET | Freq: Four times a day (QID) | ORAL | Status: DC | PRN

## 2017-05-18 MED ORDER — LOPERAMIDE HCL 2 MG PO CAPS
ORAL_CAPSULE | ORAL | Status: AC
  Filled 2017-05-18: qty 2

## 2017-05-18 MED ORDER — ACETAMINOPHEN 325 MG PO TABS
ORAL_TABLET | ORAL | Status: AC
  Filled 2017-05-18: qty 2

## 2017-05-18 MED ORDER — DIPHENHYDRAMINE HCL 12.5 MG/5ML PO ELIX: 12.5000 mg | ORAL_SOLUTION | Freq: Four times a day (QID) | ORAL | Status: DC | PRN

## 2017-05-18 MED ORDER — LOPERAMIDE HCL 2 MG PO CAPS
4.0000 mg | ORAL_CAPSULE | ORAL | Status: DC | PRN
  Administered 2017-05-18: 4 mg via ORAL

## 2017-05-18 MED ORDER — SODIUM CHLORIDE 0.9 % IV SOLN
250.0000 mL | Freq: Once | INTRAVENOUS | Status: AC
  Administered 2017-05-18: 250 mL via INTRAVENOUS

## 2017-05-18 MED ORDER — HEPARIN SOD (PORK) LOCK FLUSH 100 UNIT/ML IV SOLN: 500.0000 [IU] | Freq: Every day | INTRAVENOUS | Status: DC | PRN

## 2017-05-18 MED ORDER — MAGNESIUM SULFATE 2 GM/50ML IV SOLN
2.0000 g | Freq: Once | INTRAVENOUS | Status: AC
  Administered 2017-05-18: 2 g via INTRAVENOUS
  Filled 2017-05-18: qty 50

## 2017-05-18 MED ORDER — SODIUM CHLORIDE 0.9% FLUSH
10.0000 mL | INTRAVENOUS | Status: DC | PRN
  Filled 2017-05-18: qty 10

## 2017-05-18 MED ORDER — HEPARIN SOD (PORK) LOCK FLUSH 100 UNIT/ML IV SOLN
250.0000 [IU] | INTRAVENOUS | Status: DC | PRN
  Filled 2017-05-18: qty 5

## 2017-05-18 MED ORDER — ALPRAZOLAM 0.5 MG PO TABS
0.5000 mg | ORAL_TABLET | Freq: Every day | ORAL | Status: DC | PRN
  Administered 2017-05-18: 0.5 mg via ORAL
  Filled 2017-05-18: qty 1

## 2017-05-18 MED ORDER — BOOST / RESOURCE BREEZE PO LIQD CUSTOM
1.0000 | Freq: Three times a day (TID) | ORAL | Status: DC
  Administered 2017-05-19: 1 via ORAL

## 2017-05-18 MED ORDER — SODIUM CHLORIDE 0.9 % IV SOLN
INTRAVENOUS | Status: DC
  Administered 2017-05-18: 10:00:00 via INTRAVENOUS
  Filled 2017-05-18 (×2): qty 1000

## 2017-05-18 MED ORDER — HYDROMORPHONE HCL 4 MG PO TABS
4.0000 mg | ORAL_TABLET | ORAL | Status: DC | PRN
  Administered 2017-05-18 – 2017-05-19 (×2): 4 mg via ORAL
  Filled 2017-05-18 (×2): qty 1

## 2017-05-18 MED ORDER — ONDANSETRON HCL 4 MG PO TABS: 4.0000 mg | ORAL_TABLET | Freq: Four times a day (QID) | ORAL | Status: DC | PRN

## 2017-05-18 MED ORDER — POTASSIUM CHLORIDE IN NACL 40-0.9 MEQ/L-% IV SOLN
INTRAVENOUS | Status: DC
  Administered 2017-05-18: 125 mL/h via INTRAVENOUS
  Filled 2017-05-18 (×2): qty 1000

## 2017-05-18 MED ORDER — POTASSIUM CHLORIDE ER 10 MEQ PO TBCR: 20.0000 meq | EXTENDED_RELEASE_TABLET | Freq: Two times a day (BID) | ORAL | 0 refills | Status: DC

## 2017-05-18 NOTE — Progress Notes (Signed)
Patient transferred to room 1334.

## 2017-05-18 NOTE — H&P (Signed)
History and Physical   Lindsay [name] IHK:[id number] DOB: Aug 09, 1965 DOA: 05/18/2017  Referring MD/NP/PA: Lurline Del, MD PCP: Bernerd Limbo, MD Outpatient Specialists: Oncology, [place]  Patient coming from: Oncology clinic  Chief Complaint: Diarrhea  HPI: Lindsay [name] is a 52 y.o. female with medical history significant for metastatic triple negative breast cancer followed by Dr. Jana Hakim who presented to the cancer center today to start chemotherapy but was found to be dehydrated, lethargic, and having approximately 7 episodes of watery diarrhea that began this morning. She denies abdominal pain, fever. Having very poor per oral intake with nausea but no emesis today. Plan was for IV fluids to correct hypokalemia and dehydration as well as transfusions for symptomatic anemia when she developed abrupt onset of severe, nearly constant diarrhea. Hospitalists called for direct admission.   Review of Systems: No dysphagia, but has burning odynophagia lately. As above per HPI. All others reviewed and are negative.   Past Medical History:  Diagnosis Date  . Allergy    seasonal  . Arthritis   . Breast cancer of upper-outer quadrant of right female breast ([place]) 12/16/2015  . Depression    mild depression after death of daughter- no meds  . History of radiation therapy 06/20/17-08/04/16   right breast 50.4 gy in 28 fractions, boost 10 Gy in 5 fractions  . Hot flashes   . Hx of adenomatous colonic polyps 04/25/2014  . Hypertension    Past Surgical History:  Procedure Laterality Date  . ABDOMINAL HYSTERECTOMY    . BREAST LUMPECTOMY WITH RADIOACTIVE SEED AND SENTINEL LYMPH NODE BIOPSY Right 05/05/2016   Procedure: BREAST LUMPECTOMY WITH RADIOACTIVE SEED AND SENTINEL LYMPH NODE BIOPSY;  Surgeon: Rolm Bookbinder, MD;  Location: [facility];  Service: General;  Laterality: Right;  . CHOLECYSTECTOMY N/A 09/09/2015   Procedure: LAPAROSCOPIC CHOLECYSTECTOMY;  Surgeon: Ralene Ok, MD;  Location: [facility];  Service: General;  Laterality: N/A;  . COLONOSCOPY    . KNEE ARTHROSCOPY  2016  . PERIPHERAL VASCULAR CATHETERIZATION Right 05/05/2016   Procedure: PORTA CATH REMOVAL;  Surgeon: Rolm Bookbinder, MD;  Location: [facility];  Service: General;  Laterality: Right;  . PORTACATH PLACEMENT Right 12/29/2015   Procedure: INSERTION PORT-A-CATH WITH ULTRASOUND GUIDANCE;  Surgeon: Rolm Bookbinder, MD;  Location: [facility];  Service: General;  Laterality: Right;  . PORTACATH PLACEMENT Right 04/06/2017   Procedure: INSERTION PORT-A-CATH WITH [place];  Surgeon: Rolm Bookbinder, MD;  Location: [facility];  Service: General;  Laterality: Right;   - Nonsmoker, no EtOH or other drugs. Married.  Allergies  Allergen Reactions  . Gabapentin Other (See Comments)    "light headed, dizziness, didn't like the way it made me feel"  . Dilaudid [Hydromorphone Hcl] Nausea And Vomiting  . Percocet [Oxycodone-Acetaminophen] Diarrhea and Nausea And Vomiting  . Pollen Extract Other (See Comments)    Runny nose, itchy eyes and sneezing due to seasonal allergies.  . Tramadol Nausea And Vomiting  . Vicodin [Hydrocodone-Acetaminophen] Nausea And Vomiting   Family History  Problem Relation Age of Onset  . Hypertension Mother   . Breast cancer Maternal Grandfather   . Breast cancer Other   . Breast cancer Cousin   . Sudden death Neg Hx   . Diabetes Neg Hx   . Heart attack Neg Hx   . Hyperlipidemia Neg Hx   . Colon cancer Neg Hx   . Esophageal cancer Neg Hx   . Rectal cancer Neg Hx   . Stomach cancer Neg  Hx    - Significantly +breast CA. Family history otherwise reviewed and not pertinent.  Prior to Admission medications   Medication Sig Start Date End Date Taking? Authorizing Provider  ALPRAZolam Duanne Moron) 0.5 MG tablet Take 1 tablet (0.5 mg total) by mouth daily as needed for anxiety or sleep. 05/03/17  Yes Robbie Lis, MD  chlorhexidine (PERIDEX) 0.12 % solution 15 mLs by  Mouth Rinse route 2 (two) times daily. 05/03/17  Yes Robbie Lis, MD  dexamethasone (DECADRON) 4 MG tablet Take 2 tablets (8 mg total) by mouth daily. Start the day after chemotherapy for 2 days. Take with food. 04/06/17  Yes Magrinat, Virgie Dad, MD  diphenhydrAMINE (BENADRYL) 12.5 MG/5ML elixir Take 5 mLs (12.5 mg total) by mouth every 6 (six) hours as needed for itching. 05/03/17  Yes Robbie Lis, MD  EPINEPHrine (EPIPEN 2-PAK) 0.3 mg/0.3 mL IJ SOAJ injection Inject 0.3 mg into the muscle once as needed (anaphylaxis allergic reaction).  01/01/14  Yes [provider]  fentaNYL (DURAGESIC - DOSED MCG/HR) 25 MCG/HR patch Place 1 patch (25 mcg total) onto the skin every 3 (three) days. 05/03/17  Yes Robbie Lis, MD  fexofenadine (ALLEGRA) 180 MG tablet Take 180 mg by mouth daily as needed (for allergies.).    Yes [provider]  HYDROmorphone (DILAUDID) 4 MG tablet Take 1 tablet (4 mg total) by mouth every 2 (two) hours as needed for severe pain. 05/18/17  Yes Causey, Charlestine Massed, NP  lidocaine-prilocaine (EMLA) cream Apply to affected area once Patient taking differently: Apply 1 application topically daily as needed (apply prior to accessing port).  04/06/17  Yes Magrinat, Virgie Dad, MD  NON FORMULARY 1 Syringe every 7 (seven) days. Allergy Shot   Yes [provider]  ondansetron (ZOFRAN) 8 MG tablet Take 1 tablet (8 mg total) by mouth 2 (two) times daily as needed for refractory nausea / vomiting. Start on day 3 after chemotherapy. 05/03/17  Yes Robbie Lis, MD  venlafaxine XR (EFFEXOR-XR) 150 MG 24 hr capsule Take 1 capsule (150 mg total) by mouth daily. 04/13/17  Yes Causey, Charlestine Massed, NP  fluconazole (DIFLUCAN) 200 MG tablet Take 1 tablet (200 mg total) by mouth daily. 05/18/17   Causey, Charlestine Massed, NP  potassium chloride (K-DUR) 10 MEQ tablet Take 2 tablets (20 mEq total) by mouth 2 (two) times daily. 05/18/17   Gardenia Phlegm, NP  senna  (SENOKOT) 8.6 MG TABS tablet Take 2 tablets (17.2 mg total) by mouth 2 (two) times daily. Patient not taking: Reported on 05/18/2017 05/03/17   Robbie Lis, MD    Physical Exam: Vitals:   05/18/17 1715  BP: 111/69  Pulse: (!) 117  Resp: 20  Temp: 99.1 F (37.3 C)  TempSrc: Oral  SpO2: 98%  Weight: 75.3 kg (166 lb)  Height: 5\' 7"  (1.702 m)   Constitutional: Withdrawn, tired-appearing 52 y.o. female in no acute distress Eyes: Eyes sunken, lids and conjunctivae normal, PERRL ENMT: Mucous membranes are dry. Leukoplakia on posterior tongue. Poor dentition.  Neck: Normal, supple, no masses, no thyromegaly Respiratory: Non-labored breathing room air without accessory muscle use. Clear breath sounds to auscultation bilaterally Cardiovascular: Regular tachycardia, no murmurs, rubs, or gallops. No carotid bruits. No JVD. No LE edema. 2+ pedal pulses. Abdomen: Normoactive bowel sounds. No tenderness, non-distended, and no masses palpated. No hepatosplenomegaly. GU: No indwelling catheter Musculoskeletal: No clubbing / cyanosis. No joint deformity upper and lower extremities. No contractures. Normal muscle tone.  Skin: Warm, dry. No rashes, wounds or ulcers. Right chest port site appears without erythema, nontender without purulence.  Neurologic: CN II-XII grossly intact. Gait not assessed. Speech very low volume and sparse. Drastically diminished global strength without focal deficits in motor strength or sensation in all extremities.  Psychiatric: Drowsy and oriented x3. Normal judgment and insight. Mood depressed with restricted affect.   Assessment/Plan Active Problems:   Malignant neoplasm of upper-outer quadrant of right breast in female, estrogen receptor negative (HCC)   Bone metastases (HCC)   Hypocalcemia   Hypokalemia   Pain   Diarrhea   Depression   Thrombocytopenia (HCC)   Thrush   Symptomatic anemia   Anemia of chronic disease   Dehydration  Acute diarrhea: Abd exam  reassuring. - Stool studies - Empiric contact precautions - Empiric florastor while awaiting results. Holding empiric abx.   Dehydration: Due to above and poor per oral intake:  - IVF's: NS q/40KCl x2 L over 16 hrs. Normal echo in system. - Liquid diet as tolerated - Boost ordered, consulted RD  Symptomatic anemia of chronic disease and due to chemotherapy: No blood loss reported.   - Transfuse 1u PRBCs and recheck in AM  Hypokalemia, hypocalcemia:  - Reportedly had K supplemented, will give in IVF's.  - Will give Mg empirically and check in AM - Will give Ca by mouth, currently corrects to 7 w/hypoalbuminemia, suspect due to denosumab.  - Recheck BMP in AM   Triple negative stage IV breast cancer w/metastasis to lumbar spine: Completed XRT, now planning to start chemotherapy, delayed by present symptoms.  - Dr. Jana Hakim added to treatment team - Continue fentanyl patch, prn dilaudid (tolerates this despite it being on allergy list)  Thrombocytopenia: Stable.  - SCDs for DVT ppx  Thrush:  - Agree w/onc, will start 7 days diflucan - Peridex  Depression/anxiety:  - Continue home SSRI and prn xanax  History of urinary hesitancy/retention:  - UA and culture  DVT prophylaxis: SCDs  Code Status: Full  Family Communication: Husband and mother at bedside Disposition Plan: Home when improved. Is to establish with home palliative services. PT consulted for maintaining mobility Consults called: None, Dr. Jana Hakim aware and called for admission  Admission status: Observation    Vance Gather, MD [facility] Pager [phone]  If 7PM-7AM, please contact night-coverage [email or web address] Password [facility] 05/18/2017, 6:59 PM

## 2017-05-18 NOTE — Progress Notes (Signed)
[facility] Spiritual Care Note  Followed up with Lindsay [name] and her husband in infusion per referral from [facility] chaplain [facility]. [name] was very tired today, so she had little energy for interaction. Encouraged rest and self-care, as well as reaching out to Support Team whenever desired. They are aware that Spiritual Care is available by phone and on campus every day and know to contact chaplain whenever desired.   [place], [place], [facility] Pager [phone] Voicemail [phone]

## 2017-05-18 NOTE — Patient Instructions (Signed)
Take potassium 22meq twice a day starting tomorrow.  Take Diflucan daily for the thrush in your mouth.    Hypokalemia Hypokalemia means that the amount of potassium in the blood is lower than normal.Potassium is a chemical that helps regulate the amount of fluid in the body (electrolyte). It also stimulates muscle tightening (contraction) and helps nerves work properly.Normally, most of the body's potassium is inside of cells, and only a very small amount is in the blood. Because the amount in the blood is so small, minor changes to potassium levels in the blood can be life-threatening. What are the causes? This condition may be caused by:  Antibiotic medicine.  Diarrhea or vomiting. Taking too much of a medicine that helps you have a bowel movement (laxative) can cause diarrhea and lead to hypokalemia.  Chronic kidney disease (CKD).  Medicines that help the body get rid of excess fluid (diuretics).  Eating disorders, such as bulimia.  Low magnesium levels in the body.  Sweating a lot.  What are the signs or symptoms? Symptoms of this condition include:  Weakness.  Constipation.  Fatigue.  Muscle cramps.  Mental confusion.  Skipped heartbeats or irregular heartbeat (palpitations).  Tingling or numbness.  How is this diagnosed? This condition is diagnosed with a blood test. How is this treated? Hypokalemia can be treated by taking potassium supplements by mouth or adjusting the medicines that you take. Treatment may also include eating more foods that contain a lot of potassium. If your potassium level is very low, you may need to get potassium through an IV tube in one of your veins and be monitored in the hospital. Follow these instructions at home:  Take over-the-counter and prescription medicines only as told by your health care provider. This includes vitamins and supplements.  Eat a healthy diet. A healthy diet includes fresh fruits and vegetables, whole grains,  healthy fats, and lean proteins.  If instructed, eat more foods that contain a lot of potassium, such as: ? Nuts, such as peanuts and pistachios. ? Seeds, such as sunflower seeds and pumpkin seeds. ? Peas, lentils, and lima beans. ? Whole grain and bran cereals and breads. ? Fresh fruits and vegetables, such as apricots, avocado, bananas, cantaloupe, kiwi, oranges, tomatoes, asparagus, and potatoes. ? Orange juice. ? Tomato juice. ? Red meats. ? Yogurt.  Keep all follow-up visits as told by your health care provider. This is important. Contact a health care provider if:  You have weakness that gets worse.  You feel your heart pounding or racing.  You vomit.  You have diarrhea.  You have diabetes (diabetes mellitus) and you have trouble keeping your blood sugar (glucose) in your target range. Get help right away if:  You have chest pain.  You have shortness of breath.  You have vomiting or diarrhea that lasts for more than 2 days.  You faint. This information is not intended to replace advice given to you by your health care provider. Make sure you discuss any questions you have with your health care provider. Document Released: 04/25/2005 Document Revised: 12/12/2015 Document Reviewed: 12/12/2015 Elsevier Interactive Patient Education  2018 [employer]. Blood Transfusion, Adult A blood transfusion is a procedure in which you receive donated blood, including plasma, platelets, and red blood cells, through an IV tube. You may need a blood transfusion because of illness, surgery, or injury. The blood may come from a donor. You may also be able to donate blood for yourself (autologous blood donation) before a surgery  if you know that you might require a blood transfusion. The blood given in a transfusion is made up of different types of cells. You may receive:  Red blood cells. These carry oxygen to the cells in the body.  White blood cells. These help you fight  infections.  Platelets. These help your blood to clot.  Plasma. This is the liquid part of your blood and it helps with fluid imbalances.  If you have hemophilia or another clotting disorder, you may also receive other types of blood products. Tell a health care provider about:  Any allergies you have.  All medicines you are taking, including vitamins, herbs, eye drops, creams, and over-the-counter medicines.  Any problems you or family members have had with anesthetic medicines.  Any blood disorders you have.  Any surgeries you have had.  Any medical conditions you have, including any recent fever or cold symptoms.  Whether you are pregnant or may be pregnant.  Any previous reactions you have had during a blood transfusion. What are the risks? Generally, this is a safe procedure. However, problems may occur, including:  Having an allergic reaction to something in the donated blood. Hives and itching may be symptoms of this type of reaction.  Fever. This may be a reaction to the white blood cells in the transfused blood. Nausea or chest pain may accompany a fever.  Iron overload. This can happen from having many transfusions.  Transfusion-related acute lung injury (TRALI). This is a rare reaction that causes lung damage. The cause is not known.TRALI can occur within hours of a transfusion or several days later.  Sudden (acute) or delayed hemolytic reactions. This happens if your blood does not match the cells in your transfusion. Your body's defense system (immune system) may try to attack the new cells. This complication is rare. The symptoms include fever, chills, nausea, and low back pain or chest pain.  Infection or disease transmission. This is rare.  What happens before the procedure?  You will have a blood test to determine your blood type. This is necessary to know what kind of blood your body will accept and to match it to the donor blood.  If you are going to  have a planned surgery, you may be able to do an autologous blood donation. This may be done in case you need to have a transfusion.  If you have had an allergic reaction to a transfusion in the past, you may be given medicine to help prevent a reaction. This medicine may be given to you by mouth or through an IV tube.  You will have your temperature, blood pressure, and pulse monitored before the transfusion.  Follow instructions from your health care provider about eating and drinking restrictions.  Ask your health care provider about: ? Changing or stopping your regular medicines. This is especially important if you are taking diabetes medicines or blood thinners. ? Taking medicines such as aspirin and ibuprofen. These medicines can thin your blood. Do not take these medicines before your procedure if your health care provider instructs you not to. What happens during the procedure?  An IV tube will be inserted into one of your veins.  The bag of donated blood will be attached to your IV tube. The blood will then enter through your vein.  Your temperature, blood pressure, and pulse will be monitored regularly during the transfusion. This monitoring is done to detect early signs of a transfusion reaction.  If you have any signs  or symptoms of a reaction, your transfusion will be stopped and you may be given medicine.  When the transfusion is complete, your IV tube will be removed.  Pressure may be applied to the IV site for a few minutes.  A bandage (dressing) will be applied. The procedure may vary among health care providers and hospitals. What happens after the procedure?  Your temperature, blood pressure, heart rate, breathing rate, and blood oxygen level will be monitored often.  Your blood may be tested to see how you are responding to the transfusion.  You may be warmed with fluids or blankets to maintain a normal body temperature. Summary  A blood transfusion is a  procedure in which you receive donated blood, including plasma, platelets, and red blood cells, through an IV tube.  Your temperature, blood pressure, and pulse will be monitored before, during, and after the transfusion.  Your blood may be tested after the transfusion to see how your body has responded. This information is not intended to replace advice given to you by your health care provider. Make sure you discuss any questions you have with your health care provider. Document Released: 04/22/2000 Document Revised: 01/21/2016 Document Reviewed: 01/21/2016 Elsevier Interactive Patient Education  [employer].

## 2017-05-18 NOTE — Telephone Encounter (Signed)
Patient declined AVs and calendar of upcoming January appointments. Patient will receive update in MyChart.

## 2017-05-18 NOTE — Progress Notes (Addendum)
[facility]  [phone] [phone]     ID: Lindsay [name] DOB: April 06, 1966  MR#: [id number]  GYF#:[id number]  Patient Care Team: Bernerd Limbo, MD as PCP - General (Family Medicine) Rolm Bookbinder, MD as Consulting Physician (General Surgery) Magrinat, Virgie Dad, MD as Consulting Physician (Oncology) Gery Pray, MD as Consulting Physician (Radiation Oncology) Newton Pigg, MD as Consulting Physician (Obstetrics and Gynecology) Dorna Leitz, MD as Consulting Physician (Orthopedic Surgery) Delice Bison, Charlestine Massed, NP as Nurse Practitioner (Hematology and Oncology) OTHER MD:  CHIEF COMPLAINT: Triple negative stage IV breast cancer  CURRENT TREATMENT: CMF,  denosumab/Xgeva   BREAST CANCER HISTORY: From the original intake note:  Judee had routine screening mammography with tomography at [facility] 12/04/2015. There was a new mass measuring 1.5 cm at the 11:00 position of the right breast. Right breast ultrasonography 12/10/2015 confirmed a 1.9 cm irregular mass in the upper-outer quadrant of the right breast posteriorly. There was a right axillary lymph node with focal cortical thickening.  On 12/14/2015 Lindsay [name] underwent biopsy of the right breast mass at 9:00 position as well as a suspicious lymph node. A separate area in the right breast 10 cm from the nipple was also biopsied biopsied. The final pathology ([id number]) showed the additional area and the lymph node to be benign. The 9:00 mass however was positive for invasive ductal carcinoma, grade 3, estrogen receptor and progesterone receptor negative, with an MIB-1 of 70%, and HER-2 nonamplified with a signals ratio 1.44, and the number per cell 2.70.  Her subsequent history is as detailed below  INTERVAL HISTORY: Lindsay [name] returns today for follow-up and treatment of her metastatic triple negative breast cancer. She is accompanied by her husband today.  She continues to feel poorly.  She is due for  chemotherapy today with CMF.    REVIEW OF SYSTEMS: Lindsay [name] is not feeling well.  First, she is fatigued and dizzy.  She has decreased appetite, and is trying things such as boost, popsicles, ice cream, peanut butter, but she says they don't taste right and that she has nausea, vomiting, and/or diarrhea after eating them.  She was only able to keep a small amount of rice down last night.  She is drinking plenty.  She says that she is working on her strength with physical therapy.  They started coming out to her house last week.  They are seeing her twice a week, with a plan of 9 weeks.  She had her urinary catheter removed yesterday and she is urinating without any difficulty or hesitancy.  She says her pain is controlled with her current pain regimen of Fentanyl patches and Dilaudid PRN.  She denies any issues such as chest pain, palpitations.  Otherwise a detailed ROS is non contributory.    PAST MEDICAL HISTORY: Past Medical History:  Diagnosis Date  . Allergy    seasonal  . Arthritis   . Breast cancer of upper-outer quadrant of right female breast ([place]) 12/16/2015  . Depression    mild depression after death of daughter- no meds  . History of radiation therapy 06/20/17-08/04/16   right breast 50.4 gy in 28 fractions, boost 10 Gy in 5 fractions  . Hot flashes   . Hx of adenomatous colonic polyps 04/25/2014  . Hypertension     PAST SURGICAL HISTORY: Past Surgical History:  Procedure Laterality Date  . ABDOMINAL HYSTERECTOMY    . BREAST LUMPECTOMY WITH RADIOACTIVE SEED AND SENTINEL LYMPH NODE BIOPSY Right 05/05/2016   Procedure: BREAST LUMPECTOMY  WITH RADIOACTIVE SEED AND SENTINEL LYMPH NODE BIOPSY;  Surgeon: Rolm Bookbinder, MD;  Location: [facility];  Service: General;  Laterality: Right;  . CHOLECYSTECTOMY N/A 09/09/2015   Procedure: LAPAROSCOPIC CHOLECYSTECTOMY;  Surgeon: Ralene Ok, MD;  Location: [facility];  Service: General;  Laterality: N/A;  . COLONOSCOPY    . KNEE  ARTHROSCOPY  2016  . PERIPHERAL VASCULAR CATHETERIZATION Right 05/05/2016   Procedure: PORTA CATH REMOVAL;  Surgeon: Rolm Bookbinder, MD;  Location: [facility];  Service: General;  Laterality: Right;  . PORTACATH PLACEMENT Right 12/29/2015   Procedure: INSERTION PORT-A-CATH WITH ULTRASOUND GUIDANCE;  Surgeon: Rolm Bookbinder, MD;  Location: [facility];  Service: General;  Laterality: Right;  . PORTACATH PLACEMENT Right 04/06/2017   Procedure: INSERTION PORT-A-CATH WITH [place];  Surgeon: Rolm Bookbinder, MD;  Location: [facility];  Service: General;  Laterality: Right;    FAMILY HISTORY Family History  Problem Relation Age of Onset  . Hypertension Mother   . Breast cancer Maternal Grandfather   . Breast cancer Other   . Breast cancer Cousin   . Sudden death Neg Hx   . Diabetes Neg Hx   . Heart attack Neg Hx   . Hyperlipidemia Neg Hx   . Colon cancer Neg Hx   . Esophageal cancer Neg Hx   . Rectal cancer Neg Hx   . Stomach cancer Neg Hx   The patient's father was murdered at age 82. The patient's mother is 54 years old as of August 2017. The patient has one brother, 2 sisters. A cousin was diagnosed with left breast cancer at age 54. A maternal aunt and a maternal grandmother were also diagnosed with breast cancer at age 6 and 74 respectively.  GYNECOLOGIC HISTORY:  No LMP recorded. Patient has had a hysterectomy.  Menarche age 52, first live birth age 52, the patient is GX P3. She is status post hysterectomy without salpingo-oophorectomy. She did not take hormone replacement. She used oral contraceptives remotely without complications.  SOCIAL HISTORY:  Analysa worked as an Engineer, production at [employer].  She is now applying for disability.  Her husband [name] [place] is a news and record Lexicographer. Son [name] lives in [place] and is a Games developer. Son Gisel Vipond lives in [place] and is a Architectural technologist. The patient had a daughter who died at the age of  54.   ADVANCED DIRECTIVES: Not in place   HEALTH MAINTENANCE: Social History   Tobacco Use  . Smoking status: Never Smoker  . Smokeless tobacco: Never Used  Substance Use Topics  . Alcohol use: No    Alcohol/week: 0.0 oz  . Drug use: No     Colonoscopy: 2016  PAP:  Bone density: Never   Allergies  Allergen Reactions  . Gabapentin Other (See Comments)    "light headed, dizziness, didn't like the way it made me feel"  . Dilaudid [Hydromorphone Hcl] Nausea And Vomiting  . Percocet [Oxycodone-Acetaminophen] Diarrhea and Nausea And Vomiting  . Pollen Extract Other (See Comments)    Runny nose, itchy eyes and sneezing due to seasonal allergies.  . Tramadol Nausea And Vomiting  . Vicodin [Hydrocodone-Acetaminophen] Nausea And Vomiting    Current Outpatient Medications  Medication Sig Dispense Refill  . ALPRAZolam (XANAX) 0.5 MG tablet Take 1 tablet (0.5 mg total) by mouth daily as needed for anxiety or sleep. 15 tablet 0  . chlorhexidine (PERIDEX) 0.12 % solution 15 mLs by Mouth Rinse route 2 (two) times daily. 120 mL  0  . Cyclobenzaprine HCl (FLEXERIL PO) Take 5 mg by mouth 3 (three) times daily as needed (muscle spasms).    Marland Kitchen dexamethasone (DECADRON) 4 MG tablet Take 2 tablets (8 mg total) by mouth daily. Start the day after chemotherapy for 2 days. Take with food. 30 tablet 1  . diphenhydrAMINE (BENADRYL) 12.5 MG/5ML elixir Take 5 mLs (12.5 mg total) by mouth every 6 (six) hours as needed for itching. 120 mL 0  . EPINEPHrine (EPIPEN 2-PAK) 0.3 mg/0.3 mL IJ SOAJ injection Inject 0.3 mg into the muscle once as needed (anaphylaxis allergic reaction).     . fentaNYL (DURAGESIC - DOSED MCG/HR) 25 MCG/HR patch Place 1 patch (25 mcg total) onto the skin every 3 (three) days. 10 patch 0  . fexofenadine (ALLEGRA) 180 MG tablet Take 180 mg by mouth daily as needed (for allergies.).     Marland Kitchen HYDROmorphone (DILAUDID) 4 MG tablet Take 1 tablet (4 mg total) by mouth every 2 (two) hours as  needed for severe pain. 60 tablet 0  . Ibuprofen-Diphenhydramine HCl (ADVIL PM) 200-25 MG CAPS Take 1 tablet by mouth at bedtime as needed (sleep).    Marland Kitchen lidocaine-prilocaine (EMLA) cream Apply to affected area once 30 g 3  . NON FORMULARY 1 Syringe every 7 (seven) days. Allergy Shot    . ondansetron (ZOFRAN) 8 MG tablet Take 1 tablet (8 mg total) by mouth 2 (two) times daily as needed for refractory nausea / vomiting. Start on day 3 after chemotherapy. 30 tablet 1  . senna (SENOKOT) 8.6 MG TABS tablet Take 2 tablets (17.2 mg total) by mouth 2 (two) times daily. 120 each 0  . venlafaxine XR (EFFEXOR-XR) 150 MG 24 hr capsule Take 1 capsule (150 mg total) by mouth daily. 30 capsule 5  . fluconazole (DIFLUCAN) 200 MG tablet Take 1 tablet (200 mg total) by mouth daily. 7 tablet 1  . potassium chloride (K-DUR) 10 MEQ tablet Take 2 tablets (20 mEq total) by mouth 2 (two) times daily. 20 tablet 0   Current Facility-Administered Medications  Medication Dose Route Frequency Provider Last Rate Last Dose  . sodium chloride 0.9 % 1,000 mL with potassium chloride 20 mEq infusion   Intravenous Continuous Io Dieujuste, Charlestine Massed, NP        OBJECTIVE:  Vitals:   05/18/17 0835  BP: 106/68  Pulse: (!) 130  Resp: 20  Temp: 99 F (37.2 C)  SpO2: 100%     There is no height or weight on file to calculate BMI.    ECOG FS:3 GENERAL: Patient is a tired and chronically ill appearing female in no acute distress in wheel chair exam limited HEENT:  Sclerae icteric.  Thrush noted on tongue, buccal mucosa, and posterior pharynx. Neck is supple.  NODES:  No cervical, supraclavicular, or axillary lymphadenopathy palpated.  BREAST EXAM:  Deferred. LUNGS:  Clear to auscultation bilaterally.  No wheezes or rhonchi. HEART:  Regular rate and rhythm. No murmur appreciated. ABDOMEN:  Soft, nontender.  Positive, normoactive bowel sounds. No organomegaly palpated. MSK:  No focal spinal tenderness to palpation.  EXTREMITIES:   No peripheral edema.   NEURO:  Nonfocal. Well oriented.  Flat affect.    LAB RESULTS:  CMP     Component Value Date/Time   NA 139 05/18/2017 0759   NA 140 04/20/2017 0942   K 2.6 (LL) 05/18/2017 0759   K 3.4 (L) 04/20/2017 0942   CL 95 (L) 05/18/2017 0759   CO2 28 05/18/2017 0759  CO2 24 04/20/2017 0942   GLUCOSE 122 05/18/2017 0759   GLUCOSE 79 04/20/2017 0942   BUN 12 05/18/2017 0759   BUN 12.3 04/20/2017 0942   CREATININE 0.79 05/18/2017 0759   CREATININE 0.7 04/20/2017 0942   CALCIUM 6.4 (LL) 05/18/2017 0759   CALCIUM 6.2 (LL) 04/20/2017 0942   PROT 6.6 05/18/2017 0759   PROT 7.1 04/20/2017 0942   ALBUMIN 2.8 (L) 05/18/2017 0759   ALBUMIN 3.7 04/20/2017 0942   AST 118 (H) 05/18/2017 0759   AST 135 (H) 04/20/2017 0942   ALT 33 05/18/2017 0759   ALT 101 (H) 04/20/2017 0942   ALKPHOS 41 05/18/2017 0759   ALKPHOS 43 04/20/2017 0942   BILITOT 1.5 (H) 05/18/2017 0759   BILITOT 1.29 (H) 04/20/2017 0942   GFRNONAA >60 05/18/2017 0759   GFRAA >60 05/18/2017 0759    INo results found for: SPEP, UPEP  Lab Results  Component Value Date   WBC 2.8 (L) 05/18/2017   NEUTROABS 2.3 05/18/2017   HGB 8.0 (L) 05/18/2017   HCT 25.4 (L) 05/18/2017   MCV 88.2 05/18/2017   PLT 59 (L) 05/18/2017      Chemistry      Component Value Date/Time   NA 139 05/18/2017 0759   NA 140 04/20/2017 0942   K 2.6 (LL) 05/18/2017 0759   K 3.4 (L) 04/20/2017 0942   CL 95 (L) 05/18/2017 0759   CO2 28 05/18/2017 0759   CO2 24 04/20/2017 0942   BUN 12 05/18/2017 0759   BUN 12.3 04/20/2017 0942   CREATININE 0.79 05/18/2017 0759   CREATININE 0.7 04/20/2017 0942      Component Value Date/Time   CALCIUM 6.4 (LL) 05/18/2017 0759   CALCIUM 6.2 (LL) 04/20/2017 0942   ALKPHOS 41 05/18/2017 0759   ALKPHOS 43 04/20/2017 0942   AST 118 (H) 05/18/2017 0759   AST 135 (H) 04/20/2017 0942   ALT 33 05/18/2017 0759   ALT 101 (H) 04/20/2017 0942   BILITOT 1.5 (H) 05/18/2017 0759   BILITOT 1.29 (H)  04/20/2017 0942       No results found for: LABCA2  No components found for: [id number]  No results for input(s): INR in the last 168 hours.  Urinalysis    Component Value Date/Time   COLORURINE YELLOW 04/25/2017 0837   APPEARANCEUR CLEAR 04/25/2017 0837   LABSPEC 1.008 04/25/2017 0837   PHURINE 6.0 04/25/2017 0837   GLUCOSEU NEGATIVE 04/25/2017 0837   HGBUR NEGATIVE 04/25/2017 0837   BILIRUBINUR NEGATIVE 04/25/2017 0837   KETONESUR 5 (A) 04/25/2017 0837   PROTEINUR NEGATIVE 04/25/2017 0837   UROBILINOGEN 1.0 03/03/2015 1240   NITRITE NEGATIVE 04/25/2017 0837   LEUKOCYTESUR NEGATIVE 04/25/2017 0837     STUDIES: Mr Lumbar Spine Wo Contrast  Result Date: 04/29/2017 CLINICAL DATA:  Initial evaluation for back pain, buttock pain with weakness. History of metastatic breast cancer. EXAM: MRI LUMBAR SPINE WITHOUT CONTRAST TECHNIQUE: Multiplanar, multisequence MR imaging of the lumbar spine was performed. No intravenous contrast was administered. COMPARISON:  Prior CT from 04/25/2017. FINDINGS: Segmentation: Study moderately degraded by motion artifact. Additionally, patient was unable to tolerate the full length of the exam. No contrast was administered. Normal segmentation with the lowest well-formed disc labeled the L5-S1 level. Alignment: Normal alignment with preservation of the normal lumbar lordosis. No listhesis. Vertebrae: Bone marrow signal intensity is diffusely abnormal, likely related to diffuse osseous metastases. Vertebral body heights are relatively maintained without evidence for pathologic fracture. Large metastatic implant involving the left aspect  of the L4 vertebral body with extension into the left pedicle. Implant measures approximately 5.3 x 4.3 cm in greatest dimensions. Extra osseous extension with tumor in the adjacent paraspinous soft tissues. Epidural tumor present within the ventral and left lateral epidural space at this level (series 7, image 24). Extension into  the left L3-4 and L4-5 neural foramina. The Additional metastatic implant involving the right sacral ala demonstrates early invasion into the right S1 neural foramen (series 7, image 38). Small amount of epidural tumor just inferiorly as well (series 7, image 40). No other significant epidural tumor identified on this motion degraded exam. Conus medullaris and cauda equina: Conus extends to the L1 level. Conus and cauda equina appear normal. Paraspinal and other soft tissues: Soft tissue edema within the left paraspinous and psoas musculature adjacent to the left-sided metastasis at the L4 level. Left psoas muscle is displaced anteriorly and laterally. Abnormal edema within the left iliacus insula as well. Scattered left iliac nodes measure up to 2 cm in short axis, likely nodal metastases. Known hepatic metastases partially visualize. He scattered cystic lesions noted within the kidneys. Disc levels: L1-2:  Unremarkable. L2-3: Diffuse disc bulge. Mild facet hypertrophy. No significant canal stenosis. Mild bilateral L2 foraminal narrowing. L3-4: Diffuse disc bulge with disc desiccation. Tumor involves the left L3-4 neural foramen. Mild spinal stenosis. Moderate to severe left L3 foraminal narrowing due to metastasis. Right neural foramen patent. L4-5: Mild disc bulge. Tumor extends into the left L4-5 neural foramen. Epidural tumor within the ventral and lateral epidural space posterior to the L4 vertebral body. Secondary moderate spinal stenosis. Thecal sac measures 9 mm in AP diameter at its most narrow point. Moderate left L4 foraminal narrowing. No significant right foraminal encroachment. L5-S1: Diffuse disc bulge with disc desiccation. Epidural lipomatosis. No significant stenosis. IMPRESSION: 1. Limited exam due to patient's inability to tolerate the full length of the study. Additionally, images provided are degraded by motion artifact. 2. Findings consistent with diffuse osseous metastases. 3. Large  metastatic implant centered at the left aspect of the L4 with associated paraspinous and epidural extension as above. Tumor extends into the left L3-4 and L4-5 neural foramina. Associated moderate spinal stenosis at the level of L4. 4. Additional metastasis at the right sacral ala with extension into the right S1 neural foramen, likely affecting the transiting right S1 nerve. 5. No associated pathologic fracture at this time. 6. Hepatic and left iliac nodal metastases, partially visualized, better evaluated on recent CT. 7. Mild multilevel degenerative changes as above. Electronically Signed   By: Jeannine Boga M.D.   On: 04/29/2017 00:21   Ct Abdomen Pelvis W Contrast  Result Date: 04/25/2017 CLINICAL DATA:  Urinary retention.History of metastatic disease. EXAM: CT ABDOMEN AND PELVIS WITH CONTRAST TECHNIQUE: Multidetector CT imaging of the abdomen and pelvis was performed using the standard protocol following bolus administration of intravenous contrast. CONTRAST:  179m ISOVUE-300 IOPAMIDOL (ISOVUE-300) INJECTION 61% COMPARISON:  Abdominal MRI 03/01/2017.  CT abdomen 08/14/2015. FINDINGS: Lower chest: Heart unremarkable. Innumerable bilateral pulmonary nodules identified. Dominant nodule in the medial left lung base measures 18 x 12 mm on image 17 of series 7. Hepatobiliary: Multiple liver metastases are evident. 4.4 cm lesion in the medial segment left liver appears to be the same lesion that was about 14 mm on the previous MRI. Many of the hepatic lesions appear new in the interval and most of the lesions range in size from 5 mm up to about 14 mm. Gallbladder surgically absent. No  intrahepatic or extrahepatic biliary dilation. Pancreas: No focal mass lesion. No dilatation of the main duct. No intraparenchymal cyst. No peripancreatic edema. Spleen: No splenomegaly. No focal mass lesion. Adrenals/Urinary Tract: No adrenal nodule or mass. Kidneys unremarkable. No evidence for hydroureter. Foley catheter  decompresses the urinary bladder. Stomach/Bowel: Stomach is nondistended. No gastric wall thickening. No evidence of outlet obstruction. Duodenum is normally positioned as is the ligament of Treitz. No small bowel wall thickening. No small bowel dilatation. The terminal ileum is normal. The appendix is normal. No gross colonic mass. No colonic wall thickening. No substantial diverticular change. Vascular/Lymphatic: No abdominal aortic aneurysm. No abdominal aortic atherosclerotic calcification. There is no gastrohepatic or hepatoduodenal ligament lymphadenopathy. No intraperitoneal or retroperitoneal lymphadenopathy. 11 mm short axis necrotic left common iliac lymph nodes seen image 38 series 3. 19 mm necrotic left iliac lymph node visible on image 46. Reproductive: Uterus surgically absent.  There is no adnexal mass. Other: No intraperitoneal free fluid. Musculoskeletal: Lytic bone lesions are seen scattered through the thoracolumbar spine and bony pelvis. IMPRESSION: 1. Metastatic disease in the liver has progressed since MRI of 03/01/2017. 2. Necrotic left iliac lymphadenopathy compatible with metastatic disease. 3. Innumerable pulmonary nodules consistent with metastatic involvement. 4. Diffuse lytic bone metastases in the thoracolumbar spine and bony pelvis. 5. Foley catheter decompresses the urinary bladder. Electronically Signed   By: Misty Stanley M.D.   On: 04/25/2017 11:20   Dg Chest Portable 1 View  Result Date: 04/25/2017 CLINICAL DATA:  Initial evaluation for acute weakness. History of breast cancer. EXAM: PORTABLE CHEST 1 VIEW COMPARISON:  Prior radiograph from 04/06/2017. FINDINGS: Right-sided Port-A-Cath in place, stable. Cardiac and mediastinal silhouettes are stable in size and contour, and remain within normal limits. Lungs are mildly hypoinflated. No focal infiltrates. No pulmonary edema or pleural effusion. No pneumothorax. No acute osseus abnormality. IMPRESSION: No active cardiopulmonary  disease. Electronically Signed   By: Jeannine Boga M.D.   On: 04/25/2017 06:32   Dg Hip Unilat With Pelvis 2-3 Views Left  Result Date: 04/27/2017 CLINICAL DATA:  Acute onset of severe left hip pain which began this morning. No known injury. Current history of stage IV breast cancer for which the patient receiving chemotherapy. EXAM: DG HIP (WITH OR WITHOUT PELVIS) 2-3V LEFT COMPARISON:  None. FINDINGS: No evidence of acute fracture or dislocation. Well preserved joint space. Well preserved bone mineral density. No intrinsic osseous abnormality. Included AP pelvis demonstrates a normal-appearing contralateral right hip. Sacroiliac joints and symphysis pubis intact. Visualized lower lumbar spine unremarkable. Opaque material is present external to the patient overlying the inner thigh. IMPRESSION: Normal examination. Electronically Signed   By: Evangeline Dakin M.D.   On: 04/27/2017 20:00     ELIGIBLE FOR AVAILABLE RESEARCH PROTOCOL: considered the PREVENT trial: decided against it  ASSESSMENT: 52 y.o. [place] woman status post right breast upper outer quadrant biopsy 12/14/2015 for a clinical T1c N0, stage IA  invasive ductal carcinoma, grade 3, triple negative, with an MIB-1 of 70%  (a) this stages as IIB In the 2018 new Prognostic classification  (1) genetics testing negative for mutations within any of 20 genes on the Breast/Ovarian Cancer Panel through [employer].  Additionally, no VUS were found.   (2) neoadjuvant chemotherapy consisting of doxorubicin and cyclophosphamide in dose this fashion 4, completed 02/22/2016, followed by paclitaxel weekly 5 (of 12 treatments planned), starting 03/08/2016, stopped 04/05/2016 after cycle 5 because of neuropathy  (a) cycle 4 of cyclophosphamide and doxorubicin held 1 week because  of side effects  (3) status post right lumpectomy and sentinel lymph node sampling 05/05/2016 for a residual ypT2 ypN) invasive ductal carcinoma, grade 3,  the repeat prognostic panel again triple negative the residual cancer burden was 2  (4) adjuvant radiation 06/20/16 - 08/04/16 : 50.4 Gy to the right breast plus a 10 Gy boost  (5) left thyroid nodule biopsy 04/14/2016 read as [facility] 1  METASTATIC DISEASE: October 2018 (6) MRI of the abdomen March 01, 2017 shows multiple lesions in the lungs, liver, and bones  (a) the tumor is PD-L1 negative  (7) started capecitabine March 14, 2017; plan is to take 14 days on, 7 days off  (a) discontinued 03/19/2017 with multiple side effects  (8) started denosumab/Xgeva March 10, 2017, to be repeated every 28 days  (9) pain syndrome: currently on hydromorphone 2 mg Q4h PRN  (10) started CMF chemotherapy 04/06/2017, to be repeated every 21 days  (1) her tumor is PD-L1 negative (03/22/2017)   PLAN: Armanie has several issues today that we addressed, therefore they are listed.  She met with myself and Dr. Jana Hakim today and we reviewed the following:  1. Metastatic Breast Cancer: Due to her lab values with her platelets of 59 and her hemoglobin of 8, she will not receive treatment today.  Instead, we will see her back on 05/23/17 for lab, evaluation and CMF.    2. Symptomatic Anemia: She will receive 2 units of PRBCs.  The risks and benefits of transfusion of PRBCs were reviewed with her in detail.  She also received detailed information about this in her AVS.    3.  Pain: This is controlled.  She is currently receiving Fentanyl patches, and Dilaudid PRN.  I refilled her dilaudid today.  4. Urinary Hesitancy:   This is improved.  Her catheter was removed and she is voiding on her own without difficulty.  She and her husband know to let us know if she has any issues.    5.  Hypocalcemia: Alert today of 6.4, she auto corrects to 7 when considering her hypoalbuminemia.  She was encouraged to continue to take Tums.    6. Hypokalemia:  She will receive KCl 8mq IV today, and then start on Potassium  289m po bid until [place] next [place] She received information in her AVS about this.  7.  Immobility issues and need for increased assistance: PT is working with her, along with home health. She is also having an in home palliative care consult tomorrow which we reviewed with her in detail at today's appointment.  8.  Thrush: I sent in a 7 day course of Fluconazole, as her thrush is pretty significant.  I will check it next week, as she may need a longer duration.      TrLavaunnd her husband know to call between now and her next appointment for any questions or concerns that may arise.     LiWilber BihariNP  05/18/17 9:57 AM [facility] [address]. [phone]  Fax. [phone]  ADDENDUM: I hope to be able to get Symphoni back on chemotherapy this week, but she was certainly not up to it.  It was not just the anemia and thrombocytopenia although that was significant.  She is having dangerous watery diarrhea and has not been able to keep up with hydration at home.  She was also severely hypokalemic.  We  gave her 2 runs of potassium, gave her 20 mEq of potassium orally, and of course we will start her on 20 mEq twice daily.  They will also be some potassium in the blood that she will receive later today.  Luckily the very helpful hospitalist that we need to work with was able to get her a monitored bed.  That is the best for the time being given the fact that she was somewhat tachycardic today, although the electrocardiogram we obtained showed only sinus rhythm and no acute changes, certainly no changes compatible with significant potassium problems  We were not able to obtain a sample for C. difficile.  She was sent to the hospital with a cup and hopefully at the next episode she will be able to donate.  She is scheduled to return to see [place] on 05/23/2017 and hopefully we will be able to resume  her CMF chemotherapy then.  Bobetta Lime, MD Medical Oncology and Hematology [facility] [address] [place], [place] [address] Tel. [phone]    Fax. [phone]

## 2017-05-18 NOTE — Progress Notes (Signed)
Patient has had multiple watery bowel movements today. She is here in infusion to receive IV potassium and 2 units of blood.  1205-Pt c/o watery bowel movements and nausea. MD notified and immodium and zofran given per orders. 1325- Pt c/o shortness of breath. VS obtained and Sandi Mealy, PA paged to chairside. EKG performed and results given to Dr. Jana Hakim to further evaluate patient. 1440-Dr. Magrinat and Charlestine Massed aware of patient's difficulty tolerating her infusions today. Per Dr. Jana Hakim, the patient will be admitted to the hospital.

## 2017-05-18 NOTE — Progress Notes (Signed)
Nursing report given to [facility], [place]. Patient transported to room via w/c.

## 2017-05-19 DIAGNOSIS — C7951 Secondary malignant neoplasm of bone: Secondary | ICD-10-CM | POA: Diagnosis not present

## 2017-05-19 DIAGNOSIS — E860 Dehydration: Secondary | ICD-10-CM | POA: Diagnosis not present

## 2017-05-19 DIAGNOSIS — C50411 Malignant neoplasm of upper-outer quadrant of right female breast: Secondary | ICD-10-CM | POA: Diagnosis not present

## 2017-05-19 DIAGNOSIS — D638 Anemia in other chronic diseases classified elsewhere: Secondary | ICD-10-CM | POA: Diagnosis not present

## 2017-05-19 DIAGNOSIS — R197 Diarrhea, unspecified: Secondary | ICD-10-CM | POA: Diagnosis not present

## 2017-05-19 LAB — CBC
HCT: 29.8 % — ABNORMAL LOW (ref 36.0–46.0)
HEMOGLOBIN: 9.3 g/dL — AB (ref 12.0–15.0)
MCH: 27.3 pg (ref 26.0–34.0)
MCHC: 31.2 g/dL (ref 30.0–36.0)
MCV: 87.4 fL (ref 78.0–100.0)
PLATELETS: 228 10*3/uL (ref 150–400)
RBC: 3.41 MIL/uL — ABNORMAL LOW (ref 3.87–5.11)
RDW: 15.5 % (ref 11.5–15.5)
WBC: 11.3 10*3/uL — ABNORMAL HIGH (ref 4.0–10.5)

## 2017-05-19 LAB — GASTROINTESTINAL PANEL BY PCR, STOOL (REPLACES STOOL CULTURE)

## 2017-05-19 LAB — COMPREHENSIVE METABOLIC PANEL
ALBUMIN: 2.6 g/dL — AB (ref 3.5–5.0)
ALT: 33 U/L (ref 14–54)
ANION GAP: 6 (ref 5–15)
AST: 22 U/L (ref 15–41)
Alkaline Phosphatase: 55 U/L (ref 38–126)
CALCIUM: 8.2 mg/dL — AB (ref 8.9–10.3)
CO2: 29 mmol/L (ref 22–32)
Chloride: 102 mmol/L (ref 101–111)
Creatinine, Ser: 0.55 mg/dL (ref 0.44–1.00)
GFR calc Af Amer: >60 mL/min (ref >60)
GFR calc non Af Amer: >60 mL/min (ref >60)
GLUCOSE: 135 mg/dL — AB (ref 65–99)
Potassium: 3.1 mmol/L — ABNORMAL LOW (ref 3.5–5.1)
Sodium: 137 mmol/L (ref 135–145)
Total Bilirubin: 0.3 mg/dL (ref 0.3–1.2)
Total Protein: 6.2 g/dL — ABNORMAL LOW (ref 6.5–8.1)

## 2017-05-19 LAB — TSH: TSH: 1.886 u[IU]/mL (ref 0.350–4.500)

## 2017-05-19 LAB — URINALYSIS, ROUTINE W REFLEX MICROSCOPIC
BILIRUBIN URINE: NEGATIVE
Glucose, UA: NEGATIVE mg/dL
HGB URINE DIPSTICK: NEGATIVE
Ketones, ur: 20 mg/dL — AB
LEUKOCYTES UA: NEGATIVE
Nitrite: NEGATIVE
PH: 6 (ref 5.0–8.0)
Protein, ur: 30 mg/dL — AB
SPECIFIC GRAVITY, URINE: 1.015 (ref 1.005–1.030)

## 2017-05-19 LAB — MAGNESIUM: MAGNESIUM: 1.8 mg/dL (ref 1.7–2.4)

## 2017-05-19 MED ORDER — MAGIC MOUTHWASH W/LIDOCAINE
10.0000 mL | Freq: Four times a day (QID) | ORAL | Status: DC | PRN
  Filled 2017-05-19: qty 10

## 2017-05-19 MED ORDER — POTASSIUM CHLORIDE IN NACL 40-0.9 MEQ/L-% IV SOLN
INTRAVENOUS | Status: DC
  Filled 2017-05-19: qty 1000

## 2017-05-19 MED ORDER — HEPARIN SOD (PORK) LOCK FLUSH 100 UNIT/ML IV SOLN
500.0000 [IU] | INTRAVENOUS | Status: AC | PRN
  Administered 2017-05-19: 500 [IU]
  Filled 2017-05-19: qty 5

## 2017-05-19 NOTE — Progress Notes (Signed)
Pt is currently active with [facility] for home health serivces. No new orders are needed to resume at discharge since pt is in Observation status. [facility] rep alerted of pt discharge today. Marney Doctor RN,BSN,NCM [phone]

## 2017-05-19 NOTE — Discharge Summary (Signed)
Physician Discharge Summary  Lindsay [name] YQM:[id number] DOB: 01-Sep-1965 DOA: 05/18/2017  PCP: Bernerd Limbo, MD  Admit date: 05/18/2017 Discharge date: 05/19/2017  Admitted From: Home by way of cancer center Disposition: Home   Recommendations for Outpatient Follow-up:  1. Follow up with oncology as scheduled  Home Health: Ordered HH-aide in addition to current services Equipment/Devices: None new Discharge Condition: Stable CODE STATUS: Full Diet recommendation: As tolerated  Brief/Interim Summary: Lindsay [name] is a 52 y.o. female with medical history significant for metastatic triple negative breast cancer followed by Dr. Jana Hakim who presented to the cancer center today to start chemotherapy but was found to be dehydrated, lethargic, and having approximately 7 episodes of watery diarrhea that began this morning. She denies abdominal pain, fever. Having very poor per oral intake with nausea but no emesis today. Plan was for IV fluids to correct hypokalemia and dehydration as well as transfusions for symptomatic anemia when she developed abrupt onset of severe, nearly constant diarrhea. Hospitalists called for direct admission. IV fluids, transfusion were were ordered and electrolytes were corrected. Stool studies were negative for CDiff. Diarrhea has improved and she has tolerated a diet, feels much better.   Discharge Diagnoses:  Active Problems:   Malignant neoplasm of upper-outer quadrant of right breast in female, estrogen receptor negative (HCC)   Bone metastases (HCC)   Hypocalcemia   Hypokalemia   Pain   Diarrhea   Depression   Thrombocytopenia (HCC)   Thrush   Symptomatic anemia   Anemia of chronic disease   Dehydration  Acute diarrhea: Abd exam reassuring. CDiff negative. Significantly improved.   Dehydration: Due to above and poor per oral intake. Feels significantly better with overnight IVF's at 125cc/hr. Tolerated advanced diet.   Symptomatic anemia of chronic  disease and due to chemotherapy: No blood loss reported.   - Transfuse 1u PRBCs, hgb 8 > 9.3, pt feeling better.  Hypokalemia, hypocalcemia:  - Both improved. Mg given and normal. Will continue KDUR 34mEq BID prescribed per oncology.  - Will continue TUMS, Ca given and improved to 8.2 (corrects to normal at 9.3 w/hypoalbuminemia). Suspect due to denosumab.  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  - Dr. Jana Hakim added to treatment team - Continue fentanyl patch, prn dilaudid (tolerates this despite it being on allergy list)  Pancytopenia: Due to CA/Tx.  - Leukopenia and thrombocytopenia resolved - Anemia improved as above w/1u PRBCs.   Thrush:  - Agree w/onc, will start 7 days diflucan - Peridex  Depression/anxiety:  - Continue home SSRI and prn xanax  History of urinary hesitancy/retention:  - UA appears benign  Discharge Instructions Discharge Instructions    Diet general   Complete by:  As directed    as tolerated   Discharge instructions   Complete by:  As directed    You were admitted for dehydration, symptomatic anemia, electrolyte abnormalities (low potassium, calcium, magnesium), and diarrhea. This has all improved and you are stable for discharge with the following recommendations:  - Continue taking medications as you were, including picking up fluconazole and potassium at your pharmacy.  - Take TUMS daily as well - Continue to push boost and other nutrition to maintain your hydration status. Your stool studies came back negative, so the diarrhea is expected to continue improving.  - A home health aide has been ordered.  - Follow up with  oncology as scheduled, or seek care sooner if you are unable to take fluids by mouth.   Increase activity slowly   Complete by:  As directed      Allergies as of 05/19/2017      Reactions    Gabapentin Other (See Comments)   "light headed, dizziness, didn't like the way it made me feel"   Dilaudid [hydromorphone Hcl] Nausea And Vomiting   Percocet [oxycodone-acetaminophen] Diarrhea, Nausea And Vomiting   Pollen Extract Other (See Comments)   Runny nose, itchy eyes and sneezing due to seasonal allergies.   Tramadol Nausea And Vomiting   Vicodin [hydrocodone-acetaminophen] Nausea And Vomiting      Medication List    TAKE these medications   ALPRAZolam 0.5 MG tablet Commonly known as:  XANAX Take 1 tablet (0.5 mg total) by mouth daily as needed for anxiety or sleep.   chlorhexidine 0.12 % solution Commonly known as:  PERIDEX 15 mLs by Mouth Rinse route 2 (two) times daily.   dexamethasone 4 MG tablet Commonly known as:  DECADRON Take 2 tablets (8 mg total) by mouth daily. Start the day after chemotherapy for 2 days. Take with food.   diphenhydrAMINE 12.5 MG/5ML elixir Commonly known as:  BENADRYL Take 5 mLs (12.5 mg total) by mouth every 6 (six) hours as needed for itching.   EPIPEN 2-PAK 0.3 mg/0.3 mL Soaj injection Generic drug:  EPINEPHrine Inject 0.3 mg into the muscle once as needed (anaphylaxis allergic reaction).   fentaNYL 25 MCG/HR patch Commonly known as:  DURAGESIC - dosed mcg/hr Place 1 patch (25 mcg total) onto the skin every 3 (three) days.   fexofenadine 180 MG tablet Commonly known as:  ALLEGRA Take 180 mg by mouth daily as needed (for allergies.).   fluconazole 200 MG tablet Commonly known as:  DIFLUCAN Take 1 tablet (200 mg total) by mouth daily.   HYDROmorphone 4 MG tablet Commonly known as:  DILAUDID Take 1 tablet (4 mg total) by mouth every 2 (two) hours as needed for severe pain.   lidocaine-prilocaine cream Commonly known as:  EMLA Apply to affected area once What changed:    how much to take  how to take this  when to take this  reasons to take this  additional instructions   NON FORMULARY 1 Syringe every 7 (seven)  days. Allergy Shot   ondansetron 8 MG tablet Commonly known as:  ZOFRAN Take 1 tablet (8 mg total) by mouth 2 (two) times daily as needed for refractory nausea / vomiting. Start on day 3 after chemotherapy.   potassium chloride 10 MEQ tablet Commonly known as:  K-DUR Take 2 tablets (20 mEq total) by mouth 2 (two) times daily.   venlafaxine XR 150 MG 24 hr capsule Commonly known as:  EFFEXOR-XR Take 1 capsule (150 mg total) by mouth daily.       Allergies  Allergen Reactions  . Gabapentin Other (See Comments)    "light headed, dizziness, didn't like the way it made me feel"  . Dilaudid [Hydromorphone Hcl] Nausea And Vomiting  . Percocet [Oxycodone-Acetaminophen] Diarrhea and Nausea And Vomiting  . Pollen Extract Other (See Comments)    Runny nose, itchy eyes and sneezing due to seasonal allergies.  . Tramadol Nausea And Vomiting  . Vicodin [Hydrocodone-Acetaminophen] Nausea And Vomiting    Consultations:  None  Procedures/Studies: Mr Lumbar Spine Wo Contrast  Result Date: 04/29/2017 CLINICAL DATA:  Initial evaluation for back pain, buttock pain with weakness. History of metastatic breast  cancer. EXAM: MRI LUMBAR SPINE WITHOUT CONTRAST TECHNIQUE: Multiplanar, multisequence MR imaging of the lumbar spine was performed. No intravenous contrast was administered. COMPARISON:  Prior CT from 04/25/2017. FINDINGS: Segmentation: Study moderately degraded by motion artifact. Additionally, patient was unable to tolerate the full length of the exam. No contrast was administered. Normal segmentation with the lowest well-formed disc labeled the L5-S1 level. Alignment: Normal alignment with preservation of the normal lumbar lordosis. No listhesis. Vertebrae: Bone marrow signal intensity is diffusely abnormal, likely related to diffuse osseous metastases. Vertebral body heights are relatively maintained without evidence for pathologic fracture. Large metastatic implant involving the left aspect  of the L4 vertebral body with extension into the left pedicle. Implant measures approximately 5.3 x 4.3 cm in greatest dimensions. Extra osseous extension with tumor in the adjacent paraspinous soft tissues. Epidural tumor present within the ventral and left lateral epidural space at this level (series 7, image 24). Extension into the left L3-4 and L4-5 neural foramina. The Additional metastatic implant involving the right sacral ala demonstrates early invasion into the right S1 neural foramen (series 7, image 38). Small amount of epidural tumor just inferiorly as well (series 7, image 40). No other significant epidural tumor identified on this motion degraded exam. Conus medullaris and cauda equina: Conus extends to the L1 level. Conus and cauda equina appear normal. Paraspinal and other soft tissues: Soft tissue edema within the left paraspinous and psoas musculature adjacent to the left-sided metastasis at the L4 level. Left psoas muscle is displaced anteriorly and laterally. Abnormal edema within the left iliacus insula as well. Scattered left iliac nodes measure up to 2 cm in short axis, likely nodal metastases. Known hepatic metastases partially visualize. He scattered cystic lesions noted within the kidneys. Disc levels: L1-2:  Unremarkable. L2-3: Diffuse disc bulge. Mild facet hypertrophy. No significant canal stenosis. Mild bilateral L2 foraminal narrowing. L3-4: Diffuse disc bulge with disc desiccation. Tumor involves the left L3-4 neural foramen. Mild spinal stenosis. Moderate to severe left L3 foraminal narrowing due to metastasis. Right neural foramen patent. L4-5: Mild disc bulge. Tumor extends into the left L4-5 neural foramen. Epidural tumor within the ventral and lateral epidural space posterior to the L4 vertebral body. Secondary moderate spinal stenosis. Thecal sac measures 9 mm in AP diameter at its most narrow point. Moderate left L4 foraminal narrowing. No significant right foraminal  encroachment. L5-S1: Diffuse disc bulge with disc desiccation. Epidural lipomatosis. No significant stenosis. IMPRESSION: 1. Limited exam due to patient's inability to tolerate the full length of the study. Additionally, images provided are degraded by motion artifact. 2. Findings consistent with diffuse osseous metastases. 3. Large metastatic implant centered at the left aspect of the L4 with associated paraspinous and epidural extension as above. Tumor extends into the left L3-4 and L4-5 neural foramina. Associated moderate spinal stenosis at the level of L4. 4. Additional metastasis at the right sacral ala with extension into the right S1 neural foramen, likely affecting the transiting right S1 nerve. 5. No associated pathologic fracture at this time. 6. Hepatic and left iliac nodal metastases, partially visualized, better evaluated on recent CT. 7. Mild multilevel degenerative changes as above. Electronically Signed   By: Jeannine Boga M.D.   On: 04/29/2017 00:21   Ct Abdomen Pelvis W Contrast  Result Date: 04/25/2017 CLINICAL DATA:  Urinary retention.History of metastatic disease. EXAM: CT ABDOMEN AND PELVIS WITH CONTRAST TECHNIQUE: Multidetector CT imaging of the abdomen and pelvis was performed using the standard protocol following bolus administration of  intravenous contrast. CONTRAST:  180mL ISOVUE-300 IOPAMIDOL (ISOVUE-300) INJECTION 61% COMPARISON:  Abdominal MRI 03/01/2017.  CT abdomen 08/14/2015. FINDINGS: Lower chest: Heart unremarkable. Innumerable bilateral pulmonary nodules identified. Dominant nodule in the medial left lung base measures 18 x 12 mm on image 17 of series 7. Hepatobiliary: Multiple liver metastases are evident. 4.4 cm lesion in the medial segment left liver appears to be the same lesion that was about 14 mm on the previous MRI. Many of the hepatic lesions appear new in the interval and most of the lesions range in size from 5 mm up to about 14 mm. Gallbladder surgically  absent. No intrahepatic or extrahepatic biliary dilation. Pancreas: No focal mass lesion. No dilatation of the main duct. No intraparenchymal cyst. No peripancreatic edema. Spleen: No splenomegaly. No focal mass lesion. Adrenals/Urinary Tract: No adrenal nodule or mass. Kidneys unremarkable. No evidence for hydroureter. Foley catheter decompresses the urinary bladder. Stomach/Bowel: Stomach is nondistended. No gastric wall thickening. No evidence of outlet obstruction. Duodenum is normally positioned as is the ligament of Treitz. No small bowel wall thickening. No small bowel dilatation. The terminal ileum is normal. The appendix is normal. No gross colonic mass. No colonic wall thickening. No substantial diverticular change. Vascular/Lymphatic: No abdominal aortic aneurysm. No abdominal aortic atherosclerotic calcification. There is no gastrohepatic or hepatoduodenal ligament lymphadenopathy. No intraperitoneal or retroperitoneal lymphadenopathy. 11 mm short axis necrotic left common iliac lymph nodes seen image 38 series 3. 19 mm necrotic left iliac lymph node visible on image 46. Reproductive: Uterus surgically absent.  There is no adnexal mass. Other: No intraperitoneal free fluid. Musculoskeletal: Lytic bone lesions are seen scattered through the thoracolumbar spine and bony pelvis. IMPRESSION: 1. Metastatic disease in the liver has progressed since MRI of 03/01/2017. 2. Necrotic left iliac lymphadenopathy compatible with metastatic disease. 3. Innumerable pulmonary nodules consistent with metastatic involvement. 4. Diffuse lytic bone metastases in the thoracolumbar spine and bony pelvis. 5. Foley catheter decompresses the urinary bladder. Electronically Signed   By: Misty Stanley M.D.   On: 04/25/2017 11:20   Dg Chest Portable 1 View  Result Date: 04/25/2017 CLINICAL DATA:  Initial evaluation for acute weakness. History of breast cancer. EXAM: PORTABLE CHEST 1 VIEW COMPARISON:  Prior radiograph from  04/06/2017. FINDINGS: Right-sided Port-A-Cath in place, stable. Cardiac and mediastinal silhouettes are stable in size and contour, and remain within normal limits. Lungs are mildly hypoinflated. No focal infiltrates. No pulmonary edema or pleural effusion. No pneumothorax. No acute osseus abnormality. IMPRESSION: No active cardiopulmonary disease. Electronically Signed   By: Jeannine Boga M.D.   On: 04/25/2017 06:32   Dg Hip Unilat With Pelvis 2-3 Views Left  Result Date: 04/27/2017 CLINICAL DATA:  Acute onset of severe left hip pain which began this morning. No known injury. Current history of stage IV breast cancer for which the patient receiving chemotherapy. EXAM: DG HIP (WITH OR WITHOUT PELVIS) 2-3V LEFT COMPARISON:  None. FINDINGS: No evidence of acute fracture or dislocation. Well preserved joint space. Well preserved bone mineral density. No intrinsic osseous abnormality. Included AP pelvis demonstrates a normal-appearing contralateral right hip. Sacroiliac joints and symphysis pubis intact. Visualized lower lumbar spine unremarkable. Opaque material is present external to the patient overlying the inner thigh. IMPRESSION: Normal examination. Electronically Signed   By: Evangeline Dakin M.D.   On: 04/27/2017 20:00    Subjective: Diarrhea has improved, just 1 episode this morning. She has tolerated a diet, feels much better. Her husband states she seems "peppy."   Discharge  Exam: Vitals:   05/19/17 0023 05/19/17 0215  BP: 119/60 109/65  Pulse: (!) 106 (!) 105  Resp: 16 16  Temp: 98.4 F (36.9 C) 97.8 F (36.6 C)  SpO2: 98% 100%   General: Pleasant 51yo F in no distress HEENT: Eyes no longer sunken, MMM, leukoplakia noted.  Cardiovascular: Regular borderline tachycardia, no murmur, JVD or edema. Respiratory: Nonlabored and clear Abdominal: Soft, NT, ND, bowel sounds + Neuro: Alert, oriented without focal deficit.  Labs: Basic Metabolic Panel: Recent Labs  Lab  05/18/17 0759 05/19/17 0550  NA 139 137  K 2.6* 3.1*  CL 95* 102  CO2 28 29  GLUCOSE 122 135*  BUN 12 <5*  CREATININE 0.79 0.55  CALCIUM 6.4* 8.2*  MG  --  1.8   Liver Function Tests: Recent Labs  Lab 05/18/17 0759 05/19/17 0550  AST 118* 22  ALT 33 33  ALKPHOS 41 55  BILITOT 1.5* 0.3  PROT 6.6 6.2*  ALBUMIN 2.8* 2.6*   No results for input(s): LIPASE, AMYLASE in the last 168 hours. No results for input(s): AMMONIA in the last 168 hours. CBC: Recent Labs  Lab 05/18/17 0759 05/19/17 0500  WBC 2.8* 11.3*  NEUTROABS 2.3  --   HGB 8.0* 9.3*  HCT 25.4* 29.8*  MCV 88.2 87.4  PLT 59* 228   Cardiac Enzymes: No results for input(s): CKTOTAL, CKMB, CKMBINDEX, TROPONINI in the last 168 hours. BNP: Invalid input(s): POCBNP CBG: No results for input(s): GLUCAP in the last 168 hours. D-Dimer No results for input(s): DDIMER in the last 72 hours. Hgb A1c No results for input(s): HGBA1C in the last 72 hours. Lipid Profile No results for input(s): CHOL, HDL, LDLCALC, TRIG, CHOLHDL, LDLDIRECT in the last 72 hours. Thyroid function studies Recent Labs    05/18/17 2320  TSH 1.886   Anemia work up No results for input(s): VITAMINB12, FOLATE, FERRITIN, TIBC, IRON, RETICCTPCT in the last 72 hours. Urinalysis    Component Value Date/Time   COLORURINE AMBER (A) 05/19/2017 0640   APPEARANCEUR CLEAR 05/19/2017 0640   LABSPEC 1.015 05/19/2017 0640   PHURINE 6.0 05/19/2017 0640   GLUCOSEU NEGATIVE 05/19/2017 0640   HGBUR NEGATIVE 05/19/2017 0640   BILIRUBINUR NEGATIVE 05/19/2017 0640   KETONESUR 20 (A) 05/19/2017 0640   PROTEINUR 30 (A) 05/19/2017 0640   UROBILINOGEN 1.0 03/03/2015 1240   NITRITE NEGATIVE 05/19/2017 0640   LEUKOCYTESUR NEGATIVE 05/19/2017 0640    Microbiology Recent Results (from the past 240 hour(s))  C difficile quick scan w PCR reflex     Status: None   Collection Time: 05/18/17  8:33 PM  Result Value Ref Range Status   C Diff antigen NEGATIVE  NEGATIVE Final   C Diff toxin NEGATIVE NEGATIVE Final   C Diff interpretation No C. difficile detected.  Final    Time coordinating discharge: Approximately 40 minutes  Vance Gather, MD  [facility] 05/19/2017, 5:43 PM Pager [phone]

## 2017-05-19 NOTE — Progress Notes (Addendum)
Discharge instructions reviewed with patient and husband and both verbalized understanding. Patient discharged via private vehicle with husband.

## 2017-05-19 NOTE — Progress Notes (Signed)
PT Cancellation Note  Patient Details Name: Lindsay [name] MRN: [id number] DOB: [birth year]   Cancelled Treatment:    Reason Eval/Treat Not Completed: Patient declined, no reason specified   [facility] 05/19/2017, 12:36 PM

## 2017-05-20 LAB — URINE CULTURE

## 2017-05-21 ENCOUNTER — Other Ambulatory Visit: Payer: Self-pay | Admitting: Adult Health

## 2017-05-21 DIAGNOSIS — C50411 Malignant neoplasm of upper-outer quadrant of right female breast: Secondary | ICD-10-CM

## 2017-05-21 DIAGNOSIS — Z171 Estrogen receptor negative status [ER-]: Principal | ICD-10-CM

## 2017-05-22 ENCOUNTER — Telehealth: Payer: Self-pay | Admitting: *Deleted

## 2017-05-22 LAB — TYPE AND SCREEN
ABO/RH(D): O POS
Antibody Screen: NEGATIVE
UNIT DIVISION: 0
Unit division: 0

## 2017-05-22 LAB — BPAM RBC
Blood Product Expiration Date: 201902092359
Blood Product Expiration Date: 201902092359
ISSUE DATE / TIME: 201901102346
UNIT TYPE AND RH: 5100
Unit Type and Rh: 5100

## 2017-05-22 NOTE — Telephone Encounter (Signed)
This RN spoke with Tomasita Crumble RN with [facility]. Per pt's d/c from inpt stay - order given to resume home health services including a home health aide to assist pt with ADL's, nursing and physical therapy services.

## 2017-05-23 ENCOUNTER — Inpatient Hospital Stay: Payer: BLUE CROSS/BLUE SHIELD

## 2017-05-23 ENCOUNTER — Inpatient Hospital Stay (HOSPITAL_BASED_OUTPATIENT_CLINIC_OR_DEPARTMENT_OTHER): Payer: BLUE CROSS/BLUE SHIELD | Admitting: Adult Health

## 2017-05-23 VITALS — BP 116/77 | HR 120 | Temp 98.3°F | Resp 17 | Ht 67.0 in | Wt 160.6 lb

## 2017-05-23 DIAGNOSIS — Z803 Family history of malignant neoplasm of breast: Secondary | ICD-10-CM

## 2017-05-23 DIAGNOSIS — Z885 Allergy status to narcotic agent status: Secondary | ICD-10-CM

## 2017-05-23 DIAGNOSIS — C50411 Malignant neoplasm of upper-outer quadrant of right female breast: Secondary | ICD-10-CM | POA: Diagnosis not present

## 2017-05-23 DIAGNOSIS — C787 Secondary malignant neoplasm of liver and intrahepatic bile duct: Secondary | ICD-10-CM | POA: Diagnosis not present

## 2017-05-23 DIAGNOSIS — C7800 Secondary malignant neoplasm of unspecified lung: Secondary | ICD-10-CM

## 2017-05-23 DIAGNOSIS — R5383 Other fatigue: Secondary | ICD-10-CM | POA: Diagnosis not present

## 2017-05-23 DIAGNOSIS — I10 Essential (primary) hypertension: Secondary | ICD-10-CM

## 2017-05-23 DIAGNOSIS — C7951 Secondary malignant neoplasm of bone: Secondary | ICD-10-CM

## 2017-05-23 DIAGNOSIS — Z79899 Other long term (current) drug therapy: Secondary | ICD-10-CM | POA: Diagnosis not present

## 2017-05-23 DIAGNOSIS — Z171 Estrogen receptor negative status [ER-]: Secondary | ICD-10-CM | POA: Diagnosis not present

## 2017-05-23 DIAGNOSIS — Z9221 Personal history of antineoplastic chemotherapy: Secondary | ICD-10-CM

## 2017-05-23 DIAGNOSIS — Z95828 Presence of other vascular implants and grafts: Secondary | ICD-10-CM

## 2017-05-23 DIAGNOSIS — Z90710 Acquired absence of both cervix and uterus: Secondary | ICD-10-CM

## 2017-05-23 LAB — CBC WITH DIFFERENTIAL/PLATELET
BASOS ABS: 0.1 10*3/uL (ref 0.0–0.1)
Basophils Relative: 5 %
Eosinophils Absolute: 0.1 10*3/uL (ref 0.0–0.5)
Eosinophils Relative: 6 %
HEMATOCRIT: 25.5 % — AB (ref 34.8–46.6)
HEMOGLOBIN: 8 g/dL — AB (ref 11.6–15.9)
LYMPHS PCT: 14 %
Lymphs Abs: 0.3 10*3/uL — ABNORMAL LOW (ref 0.9–3.3)
MCH: 27.8 pg (ref 25.1–34.0)
MCHC: 31.4 g/dL — ABNORMAL LOW (ref 31.5–36.0)
MCV: 88.5 fL (ref 79.5–101.0)
Monocytes Absolute: 0.3 10*3/uL (ref 0.1–0.9)
Monocytes Relative: 14 %
NEUTROS ABS: 1.4 10*3/uL — AB (ref 1.5–6.5)
NEUTROS PCT: 63 %
PLATELETS: 45 10*3/uL — AB (ref 145–400)
RBC: 2.88 MIL/uL — AB (ref 3.70–5.45)
RDW: 17.5 % — ABNORMAL HIGH (ref 11.2–16.1)
WBC: 2.2 10*3/uL — ABNORMAL LOW (ref 3.9–10.3)

## 2017-05-23 MED ORDER — DENOSUMAB 120 MG/1.7ML ~~LOC~~ SOLN: 120.0000 mg | Freq: Once | SUBCUTANEOUS | Status: DC

## 2017-05-23 MED ORDER — SODIUM CHLORIDE 0.9% FLUSH
10.0000 mL | Freq: Once | INTRAVENOUS | Status: DC
  Filled 2017-05-23: qty 10

## 2017-05-23 NOTE — Progress Notes (Addendum)
[place]  Telephone:([phone]) [phone] Fax:(336) [phone]     ID: Lindsay [name] DOB: [birth year]  MR#: [id number]  AYO#:[id number]  Patient Care Team: Bernerd Limbo, MD as PCP - General (Family Medicine) Rolm Bookbinder, MD as Consulting Physician (General Surgery) Magrinat, Virgie Dad, MD as Consulting Physician (Oncology) Gery Pray, MD as Consulting Physician (Radiation Oncology) Newton Pigg, MD as Consulting Physician (Obstetrics and Gynecology) Dorna Leitz, MD as Consulting Physician (Orthopedic Surgery) Delice Bison, Charlestine Massed, NP as Nurse Practitioner (Hematology and Oncology) OTHER MD:  CHIEF COMPLAINT: Triple negative stage IV breast cancer  CURRENT TREATMENT: CMF,  denosumab/Xgeva   BREAST CANCER HISTORY: From the original intake note:  Lindsay [name] had routine screening mammography with tomography at [facility] 12/04/2015. There was a new mass measuring 1.5 cm at the 11:00 position of the right breast. Right breast ultrasonography 12/10/2015 confirmed a 1.9 cm irregular mass in the upper-outer quadrant of the right breast posteriorly. There was a right axillary lymph node with focal cortical thickening.  On 12/14/2015 Lindsay [name] underwent biopsy of the right breast mass at 9:00 position as well as a suspicious lymph node. A separate area in the right breast 10 cm from the nipple was also biopsied biopsied. The final pathology ([id number]) showed the additional area and the lymph node to be benign. The 9:00 mass however was positive for invasive ductal carcinoma, grade 3, estrogen receptor and progesterone receptor negative, with an MIB-1 of 70%, and HER-2 nonamplified with a signals ratio 1.44, and the number per cell 2.70.  Her subsequent history is as detailed below  INTERVAL HISTORY: Lindsay [name] returns today for follow-up and treatment of her metastatic triple negative breast cancer. She is accompanied by her  She is due to receive CMF chemotherapy today.  She does not  want chemotherapy today since her husband cannot be with her today.    REVIEW OF SYSTEMS: Lindsay [name] is doing well today.  She is feeling better.  She is eating more.  She says she feels as if she is getting stronger.  She continues to work with physical therapy.  She says her pain is controlled with Fentanyl patches and Dilaudid PRN.    PAST MEDICAL HISTORY: Past Medical History:  Diagnosis Date  . Allergy    seasonal  . Arthritis   . Breast cancer of upper-outer quadrant of right female breast ([place]) 12/16/2015  . Depression    mild depression after death of daughter- no meds  . History of radiation therapy 06/20/17-08/04/16   right breast 50.4 gy in 28 fractions, boost 10 Gy in 5 fractions  . Hot flashes   . Hx of adenomatous colonic polyps 04/25/2014  . Hypertension     PAST SURGICAL HISTORY: Past Surgical History:  Procedure Laterality Date  . ABDOMINAL HYSTERECTOMY    . BREAST LUMPECTOMY WITH RADIOACTIVE SEED AND SENTINEL LYMPH NODE BIOPSY Right 05/05/2016   Procedure: BREAST LUMPECTOMY WITH RADIOACTIVE SEED AND SENTINEL LYMPH NODE BIOPSY;  Surgeon: Rolm Bookbinder, MD;  Location: [place];  Service: General;  Laterality: Right;  . CHOLECYSTECTOMY N/A 09/09/2015   Procedure: LAPAROSCOPIC CHOLECYSTECTOMY;  Surgeon: Ralene Ok, MD;  Location: [facility];  Service: General;  Laterality: N/A;  . COLONOSCOPY    . KNEE ARTHROSCOPY  2016  . PERIPHERAL VASCULAR CATHETERIZATION Right 05/05/2016   Procedure: PORTA CATH REMOVAL;  Surgeon: Rolm Bookbinder, MD;  Location: [place];  Service: General;  Laterality: Right;  . PORTACATH PLACEMENT Right 12/29/2015   Procedure: INSERTION PORT-A-CATH WITH  ULTRASOUND GUIDANCE;  Surgeon: Rolm Bookbinder, MD;  Location: [place];  Service: General;  Laterality: Right;  . PORTACATH PLACEMENT Right 04/06/2017   Procedure: INSERTION PORT-A-CATH WITH [place];  Surgeon: Rolm Bookbinder, MD;  Location: [place];  Service: General;   Laterality: Right;    FAMILY HISTORY Family History  Problem Relation Age of Onset  . Hypertension Mother   . Breast cancer Maternal Grandfather   . Breast cancer Other   . Breast cancer Cousin   . Sudden death Neg Hx   . Diabetes Neg Hx   . Heart attack Neg Hx   . Hyperlipidemia Neg Hx   . Colon cancer Neg Hx   . Esophageal cancer Neg Hx   . Rectal cancer Neg Hx   . Stomach cancer Neg Hx   The patient's father was murdered at age 18. The patient's mother is 55 years old as of August 2017. The patient has one brother, 2 sisters. A cousin was diagnosed with left breast cancer at age 76. A maternal aunt and a maternal grandmother were also diagnosed with breast cancer at age 52 and 42 respectively.  GYNECOLOGIC HISTORY:  No LMP recorded. Patient has had a hysterectomy.  Menarche age 68, first live birth age 52, the patient is GX P3. She is status post hysterectomy without salpingo-oophorectomy. She did not take hormone replacement. She used oral contraceptives remotely without complications.  SOCIAL HISTORY:  Lindsay [name] worked as an Engineer, production at [employer].  She is now applying for disability.  Her husband [name] Warsaw is a news and record Lexicographer. Son [name] lives in [place] and is a Games developer. Son Shaye Elling lives in [place] and is a Architectural technologist. The patient had a daughter who died at the age of 21.   ADVANCED DIRECTIVES: Not in place   HEALTH MAINTENANCE: Social History   Tobacco Use  . Smoking status: Never Smoker  . Smokeless tobacco: Never Used  Substance Use Topics  . Alcohol use: No    Alcohol/week: 0.0 oz  . Drug use: No     Colonoscopy: 2016  PAP:  Bone density: Never   Allergies  Allergen Reactions  . Gabapentin Other (See Comments)    "light headed, dizziness, didn't like the way it made me feel"  . Dilaudid [Hydromorphone Hcl] Nausea And Vomiting  . Percocet [Oxycodone-Acetaminophen] Diarrhea and Nausea And Vomiting  . Pollen  Extract Other (See Comments)    Runny nose, itchy eyes and sneezing due to seasonal allergies.  . Tramadol Nausea And Vomiting  . Vicodin [Hydrocodone-Acetaminophen] Nausea And Vomiting    Current Outpatient Medications  Medication Sig Dispense Refill  . ALPRAZolam (XANAX) 0.5 MG tablet Take 1 tablet (0.5 mg total) by mouth daily as needed for anxiety or sleep. 15 tablet 0  . chlorhexidine (PERIDEX) 0.12 % solution 15 mLs by Mouth Rinse route 2 (two) times daily. 120 mL 0  . dexamethasone (DECADRON) 4 MG tablet Take 2 tablets (8 mg total) by mouth daily. Start the day after chemotherapy for 2 days. Take with food. 30 tablet 1  . diphenhydrAMINE (BENADRYL) 12.5 MG/5ML elixir Take 5 mLs (12.5 mg total) by mouth every 6 (six) hours as needed for itching. 120 mL 0  . EPINEPHrine (EPIPEN 2-PAK) 0.3 mg/0.3 mL IJ SOAJ injection Inject 0.3 mg into the muscle once as needed (anaphylaxis allergic reaction).     . fentaNYL (DURAGESIC - DOSED MCG/HR) 25 MCG/HR patch Place 1 patch (25 mcg total) onto the skin every  3 (three) days. 10 patch 0  . fexofenadine (ALLEGRA) 180 MG tablet Take 180 mg by mouth daily as needed (for allergies.).     Marland Kitchen fluconazole (DIFLUCAN) 200 MG tablet Take 1 tablet (200 mg total) by mouth daily. 7 tablet 1  . HYDROmorphone (DILAUDID) 4 MG tablet Take 1 tablet (4 mg total) by mouth every 2 (two) hours as needed for severe pain. 60 tablet 0  . lidocaine-prilocaine (EMLA) cream Apply to affected area once (Patient taking differently: Apply 1 application topically daily as needed (apply prior to accessing port). ) 30 g 3  . NON FORMULARY 1 Syringe every 7 (seven) days. Allergy Shot    . ondansetron (ZOFRAN) 8 MG tablet Take 1 tablet (8 mg total) by mouth 2 (two) times daily as needed for refractory nausea / vomiting. Start on day 3 after chemotherapy. 30 tablet 1  . potassium chloride (K-DUR) 10 MEQ tablet Take 2 tablets (20 mEq total) by mouth 2 (two) times daily. 20 tablet 0  .  venlafaxine XR (EFFEXOR-XR) 150 MG 24 hr capsule Take 1 capsule (150 mg total) by mouth daily. 30 capsule 5   No current facility-administered medications for this visit.    Facility-Administered Medications Ordered in Other Visits  Medication Dose Route Frequency Provider Last Rate Last Dose  . denosumab (XGEVA) injection 120 mg  120 mg Subcutaneous Once Magrinat, Virgie Dad, MD      . sodium chloride flush (NS) 0.9 % injection 10 mL  10 mL Intracatheter Once Magrinat, Virgie Dad, MD        OBJECTIVE:  Vitals:   05/23/17 1309  BP: 116/77  Pulse: (!) 120  Resp: 17  Temp: 98.3 F (36.8 C)  SpO2: 95%     Body mass index is 25.15 kg/m.    ECOG FS:3 GENERAL: Patient is a tired and chronically ill appearing female in no acute distress in wheel chair exam limited HEENT:  Sclerae icteric.  Oropharynx clear without candidiasis or ulcerations. Neck is supple.  NODES:  No cervical, supraclavicular, or axillary lymphadenopathy palpated.  BREAST EXAM:  Deferred. LUNGS:  Clear to auscultation bilaterally.  No wheezes or rhonchi. HEART:  Regular rate and rhythm. No murmur appreciated. ABDOMEN:  Soft, nontender.  Positive, normoactive bowel sounds. No organomegaly palpated. MSK:  No focal spinal tenderness to palpation.  EXTREMITIES:  No peripheral edema.   NEURO:  Nonfocal. Well oriented.  Flat affect.    LAB RESULTS:  CMP     Component Value Date/Time   NA 137 05/19/2017 0550   NA 140 04/20/2017 0942   K 3.1 (L) 05/19/2017 0550   K 3.4 (L) 04/20/2017 0942   CL 102 05/19/2017 0550   CO2 29 05/19/2017 0550   CO2 24 04/20/2017 0942   GLUCOSE 135 (H) 05/19/2017 0550   GLUCOSE 79 04/20/2017 0942   BUN <5 (L) 05/19/2017 0550   BUN 12.3 04/20/2017 0942   CREATININE 0.55 05/19/2017 0550   CREATININE 0.7 04/20/2017 0942   CALCIUM 8.2 (L) 05/19/2017 0550   CALCIUM 6.2 (LL) 04/20/2017 0942   PROT 6.2 (L) 05/19/2017 0550   PROT 7.1 04/20/2017 0942   ALBUMIN 2.6 (L) 05/19/2017 0550    ALBUMIN 3.7 04/20/2017 0942   AST 22 05/19/2017 0550   AST 135 (H) 04/20/2017 0942   ALT 33 05/19/2017 0550   ALT 101 (H) 04/20/2017 0942   ALKPHOS 55 05/19/2017 0550   ALKPHOS 43 04/20/2017 0942   BILITOT 0.3 05/19/2017 0550   BILITOT  1.29 (H) 04/20/2017 0942   GFRNONAA >60 05/19/2017 0550   GFRAA >60 05/19/2017 0550    INo results found for: SPEP, UPEP  Lab Results  Component Value Date   WBC 11.3 (H) 05/19/2017   NEUTROABS 2.3 05/18/2017   HGB 9.3 (L) 05/19/2017   HCT 29.8 (L) 05/19/2017   MCV 87.4 05/19/2017   PLT 228 05/19/2017      Chemistry      Component Value Date/Time   NA 137 05/19/2017 0550   NA 140 04/20/2017 0942   K 3.1 (L) 05/19/2017 0550   K 3.4 (L) 04/20/2017 0942   CL 102 05/19/2017 0550   CO2 29 05/19/2017 0550   CO2 24 04/20/2017 0942   BUN <5 (L) 05/19/2017 0550   BUN 12.3 04/20/2017 0942   CREATININE 0.55 05/19/2017 0550   CREATININE 0.7 04/20/2017 0942      Component Value Date/Time   CALCIUM 8.2 (L) 05/19/2017 0550   CALCIUM 6.2 (LL) 04/20/2017 0942   ALKPHOS 55 05/19/2017 0550   ALKPHOS 43 04/20/2017 0942   AST 22 05/19/2017 0550   AST 135 (H) 04/20/2017 0942   ALT 33 05/19/2017 0550   ALT 101 (H) 04/20/2017 0942   BILITOT 0.3 05/19/2017 0550   BILITOT 1.29 (H) 04/20/2017 0942       No results found for: LABCA2  No components found for: [id number]  No results for input(s): INR in the last 168 hours.  Urinalysis    Component Value Date/Time   COLORURINE AMBER (A) 05/19/2017 0640   APPEARANCEUR CLEAR 05/19/2017 0640   LABSPEC 1.015 05/19/2017 0640   PHURINE 6.0 05/19/2017 0640   GLUCOSEU NEGATIVE 05/19/2017 0640   HGBUR NEGATIVE 05/19/2017 0640   BILIRUBINUR NEGATIVE 05/19/2017 0640   KETONESUR 20 (A) 05/19/2017 0640   PROTEINUR 30 (A) 05/19/2017 0640   UROBILINOGEN 1.0 03/03/2015 1240   NITRITE NEGATIVE 05/19/2017 0640   LEUKOCYTESUR NEGATIVE 05/19/2017 0640     STUDIES: Mr Lumbar Spine Wo Contrast  Result Date:  04/29/2017 CLINICAL DATA:  Initial evaluation for back pain, buttock pain with weakness. History of metastatic breast cancer. EXAM: MRI LUMBAR SPINE WITHOUT CONTRAST TECHNIQUE: Multiplanar, multisequence MR imaging of the lumbar spine was performed. No intravenous contrast was administered. COMPARISON:  Prior CT from 04/25/2017. FINDINGS: Segmentation: Study moderately degraded by motion artifact. Additionally, patient was unable to tolerate the full length of the exam. No contrast was administered. Normal segmentation with the lowest well-formed disc labeled the L5-S1 level. Alignment: Normal alignment with preservation of the normal lumbar lordosis. No listhesis. Vertebrae: Bone marrow signal intensity is diffusely abnormal, likely related to diffuse osseous metastases. Vertebral body heights are relatively maintained without evidence for pathologic fracture. Large metastatic implant involving the left aspect of the L4 vertebral body with extension into the left pedicle. Implant measures approximately 5.3 x 4.3 cm in greatest dimensions. Extra osseous extension with tumor in the adjacent paraspinous soft tissues. Epidural tumor present within the ventral and left lateral epidural space at this level (series 7, image 24). Extension into the left L3-4 and L4-5 neural foramina. The Additional metastatic implant involving the right sacral ala demonstrates early invasion into the right S1 neural foramen (series 7, image 38). Small amount of epidural tumor just inferiorly as well (series 7, image 40). No other significant epidural tumor identified on this motion degraded exam. Conus medullaris and cauda equina: Conus extends to the L1 level. Conus and cauda equina appear normal. Paraspinal and other soft tissues: Soft tissue  edema within the left paraspinous and psoas musculature adjacent to the left-sided metastasis at the L4 level. Left psoas muscle is displaced anteriorly and laterally. Abnormal edema within the  left iliacus insula as well. Scattered left iliac nodes measure up to 2 cm in short axis, likely nodal metastases. Known hepatic metastases partially visualize. He scattered cystic lesions noted within the kidneys. Disc levels: L1-2:  Unremarkable. L2-3: Diffuse disc bulge. Mild facet hypertrophy. No significant canal stenosis. Mild bilateral L2 foraminal narrowing. L3-4: Diffuse disc bulge with disc desiccation. Tumor involves the left L3-4 neural foramen. Mild spinal stenosis. Moderate to severe left L3 foraminal narrowing due to metastasis. Right neural foramen patent. L4-5: Mild disc bulge. Tumor extends into the left L4-5 neural foramen. Epidural tumor within the ventral and lateral epidural space posterior to the L4 vertebral body. Secondary moderate spinal stenosis. Thecal sac measures 9 mm in AP diameter at its most narrow point. Moderate left L4 foraminal narrowing. No significant right foraminal encroachment. L5-S1: Diffuse disc bulge with disc desiccation. Epidural lipomatosis. No significant stenosis. IMPRESSION: 1. Limited exam due to patient's inability to tolerate the full length of the study. Additionally, images provided are degraded by motion artifact. 2. Findings consistent with diffuse osseous metastases. 3. Large metastatic implant centered at the left aspect of the L4 with associated paraspinous and epidural extension as above. Tumor extends into the left L3-4 and L4-5 neural foramina. Associated moderate spinal stenosis at the level of L4. 4. Additional metastasis at the right sacral ala with extension into the right S1 neural foramen, likely affecting the transiting right S1 nerve. 5. No associated pathologic fracture at this time. 6. Hepatic and left iliac nodal metastases, partially visualized, better evaluated on recent CT. 7. Mild multilevel degenerative changes as above. Electronically Signed   By: Jeannine Boga M.D.   On: 04/29/2017 00:21   Ct Abdomen Pelvis W  Contrast  Result Date: 04/25/2017 CLINICAL DATA:  Urinary retention.History of metastatic disease. EXAM: CT ABDOMEN AND PELVIS WITH CONTRAST TECHNIQUE: Multidetector CT imaging of the abdomen and pelvis was performed using the standard protocol following bolus administration of intravenous contrast. CONTRAST:  163m ISOVUE-300 IOPAMIDOL (ISOVUE-300) INJECTION 61% COMPARISON:  Abdominal MRI 03/01/2017.  CT abdomen 08/14/2015. FINDINGS: Lower chest: Heart unremarkable. Innumerable bilateral pulmonary nodules identified. Dominant nodule in the medial left lung base measures 18 x 12 mm on image 17 of series 7. Hepatobiliary: Multiple liver metastases are evident. 4.4 cm lesion in the medial segment left liver appears to be the same lesion that was about 14 mm on the previous MRI. Many of the hepatic lesions appear new in the interval and most of the lesions range in size from 5 mm up to about 14 mm. Gallbladder surgically absent. No intrahepatic or extrahepatic biliary dilation. Pancreas: No focal mass lesion. No dilatation of the main duct. No intraparenchymal cyst. No peripancreatic edema. Spleen: No splenomegaly. No focal mass lesion. Adrenals/Urinary Tract: No adrenal nodule or mass. Kidneys unremarkable. No evidence for hydroureter. Foley catheter decompresses the urinary bladder. Stomach/Bowel: Stomach is nondistended. No gastric wall thickening. No evidence of outlet obstruction. Duodenum is normally positioned as is the ligament of Treitz. No small bowel wall thickening. No small bowel dilatation. The terminal ileum is normal. The appendix is normal. No gross colonic mass. No colonic wall thickening. No substantial diverticular change. Vascular/Lymphatic: No abdominal aortic aneurysm. No abdominal aortic atherosclerotic calcification. There is no gastrohepatic or hepatoduodenal ligament lymphadenopathy. No intraperitoneal or retroperitoneal lymphadenopathy. 11 mm short axis necrotic  left common iliac lymph  nodes seen image 38 series 3. 19 mm necrotic left iliac lymph node visible on image 46. Reproductive: Uterus surgically absent.  There is no adnexal mass. Other: No intraperitoneal free fluid. Musculoskeletal: Lytic bone lesions are seen scattered through the thoracolumbar spine and bony pelvis. IMPRESSION: 1. Metastatic disease in the liver has progressed since MRI of 03/01/2017. 2. Necrotic left iliac lymphadenopathy compatible with metastatic disease. 3. Innumerable pulmonary nodules consistent with metastatic involvement. 4. Diffuse lytic bone metastases in the thoracolumbar spine and bony pelvis. 5. Foley catheter decompresses the urinary bladder. Electronically Signed   By: Misty Stanley M.D.   On: 04/25/2017 11:20   Dg Chest Portable 1 View  Result Date: 04/25/2017 CLINICAL DATA:  Initial evaluation for acute weakness. History of breast cancer. EXAM: PORTABLE CHEST 1 VIEW COMPARISON:  Prior radiograph from 04/06/2017. FINDINGS: Right-sided Port-A-Cath in place, stable. Cardiac and mediastinal silhouettes are stable in size and contour, and remain within normal limits. Lungs are mildly hypoinflated. No focal infiltrates. No pulmonary edema or pleural effusion. No pneumothorax. No acute osseus abnormality. IMPRESSION: No active cardiopulmonary disease. Electronically Signed   By: Jeannine Boga M.D.   On: 04/25/2017 06:32   Dg Hip Unilat With Pelvis 2-3 Views Left  Result Date: 04/27/2017 CLINICAL DATA:  Acute onset of severe left hip pain which began this morning. No known injury. Current history of stage IV breast cancer for which the patient receiving chemotherapy. EXAM: DG HIP (WITH OR WITHOUT PELVIS) 2-3V LEFT COMPARISON:  None. FINDINGS: No evidence of acute fracture or dislocation. Well preserved joint space. Well preserved bone mineral density. No intrinsic osseous abnormality. Included AP pelvis demonstrates a normal-appearing contralateral right hip. Sacroiliac joints and symphysis  pubis intact. Visualized lower lumbar spine unremarkable. Opaque material is present external to the patient overlying the inner thigh. IMPRESSION: Normal examination. Electronically Signed   By: Evangeline Dakin M.D.   On: 04/27/2017 20:00     ELIGIBLE FOR AVAILABLE RESEARCH PROTOCOL: considered the PREVENT trial: decided against it  ASSESSMENT: 52 y.o. Lindsay [name] woman status post right breast upper outer quadrant biopsy 12/14/2015 for a clinical T1c N0, stage IA  invasive ductal carcinoma, grade 3, triple negative, with an MIB-1 of 70%  (a) this stages as IIB In the 2018 new Prognostic classification  (1) genetics testing negative for mutations within any of 20 genes on the Breast/Ovarian Cancer Panel through [employer].  Additionally, no VUS were found.   (2) neoadjuvant chemotherapy consisting of doxorubicin and cyclophosphamide in dose this fashion 4, completed 02/22/2016, followed by paclitaxel weekly 5 (of 12 treatments planned), starting 03/08/2016, stopped 04/05/2016 after cycle 5 because of neuropathy  (a) cycle 4 of cyclophosphamide and doxorubicin held 1 week because of side effects  (3) status post right lumpectomy and sentinel lymph node sampling 05/05/2016 for a residual ypT2 ypN) invasive ductal carcinoma, grade 3, the repeat prognostic panel again triple negative the residual cancer burden was 2  (4) adjuvant radiation 06/20/16 - 08/04/16 : 50.4 Gy to the right breast plus a 10 Gy boost  (5) left thyroid nodule biopsy 04/14/2016 read as [facility] 1  METASTATIC DISEASE: October 2018 (6) MRI of the abdomen March 01, 2017 shows multiple lesions in the lungs, liver, and bones  (a) the tumor is PD-L1 negative  (7) started capecitabine March 14, 2017; plan is to take 14 days on, 7 days off  (a) discontinued 03/19/2017 with multiple side effects  (8) started denosumab/Xgeva March 10, 2017,  to be repeated every 28 days  (9) pain syndrome: currently on  hydromorphone 2 mg Q4h PRN  (10) started CMF chemotherapy 04/06/2017, to be repeated every 21 days  (1) her tumor is PD-L1 negative (03/22/2017)   PLAN: Lindsay [name] is doing well today and she is feeling improved.  Since her husband cannot be with her today, she declines to receive treatment. [name] [place], her husband will call us with his request for treatment date and we will work hard to accommodate this request.  She will continue with her current pain regimen.  I reviewed her CBC with her today which is stable.  Dr. Jana Hakim also came to see Lindsay [name] today.    We will adjust Kally's appointments once we have a better idea of when she will be treated.  She knows to call between now and her next appointment for any questions or concerns.       Wilber Bihari, NP  05/23/17 1:18 PM Medical Oncology and Hematology [facility] [address] [place], [place] [address] Tel. [phone]    Fax. [phone]     ADDENDUM: Lindsay [name] is just beginning to feel better.  However she did not feel confident enough to get treated today.  She wants her husband to be present when she gets treated.  We did discuss the fact that Lindsay [name] needs to start (or restart) her chemotherapy if she wishes to make progress against her metastatic breast cancer.  I personally saw this patient and performed a substantive portion of this encounter with the listed APP documented above.   Lindsay Lime, MD Medical Oncology and Hematology [facility] [address] [place], [place] [address] Tel. [phone]    Fax. [phone]

## 2017-05-23 NOTE — Progress Notes (Signed)
Patient wanted labs drawn peripherally. Vernette Moise LPN

## 2017-05-24 ENCOUNTER — Encounter: Payer: Self-pay | Admitting: Internal Medicine

## 2017-05-24 ENCOUNTER — Telehealth: Payer: Self-pay | Admitting: Adult Health

## 2017-05-24 NOTE — Telephone Encounter (Signed)
No 11/5 los.  °

## 2017-05-25 NOTE — Progress Notes (Signed)
  Radiation Oncology         [phone] ________________________________  Name: Lindsay [name] MRN: [id number]  Date: 05/17/2017  DOB: Oct 02, 1965  End of Treatment Note  Diagnosis:   52 y.o. female with bone metastases to the lumbar spine / sacrum     Indication for treatment::  palliative       Radiation treatment dates:   05/01/2017 - 05/17/2017  Site/dose:   The lumbar spine and sacrum were each treated to 4 Gy in 1 fractions, followed by an additional 27 Gy in 9 fractions of 3 Gy to yield a total dose of 31 Gy, using the 3-D technique.  Narrative: The patient tolerated radiation treatment relatively well.   She experienced severe fatigue and poor appetite due to worsening nausea. Her nausea was managed with Zofran. She denied any pain or skin changes.  Plan: The patient has completed radiation treatment. The patient will return to radiation oncology clinic for routine followup in one month. I advised the patient to call or return sooner if they have any questions or concerns related to their recovery or treatment. ________________________________  Jodelle Gross, MD, PhD  This document serves as a record of services personally performed by Kyung Rudd, MD. It was created on his behalf by Rae Lips, a trained medical scribe. The creation of this record is based on the scribe's personal observations and the provider's statements to them. This document has been checked and approved by the attending provider.

## 2017-05-30 ENCOUNTER — Emergency Department (HOSPITAL_COMMUNITY): Payer: BLUE CROSS/BLUE SHIELD

## 2017-05-30 ENCOUNTER — Emergency Department (HOSPITAL_COMMUNITY)
Admission: EM | Admit: 2017-05-30 | Discharge: 2017-05-30 | Disposition: A | Discharge status: Hospice | Payer: BLUE CROSS/BLUE SHIELD | Attending: Emergency Medicine | Admitting: Emergency Medicine

## 2017-05-30 ENCOUNTER — Other Ambulatory Visit: Payer: BLUE CROSS/BLUE SHIELD

## 2017-05-30 ENCOUNTER — Ambulatory Visit: Payer: BLUE CROSS/BLUE SHIELD | Admitting: Oncology

## 2017-05-30 ENCOUNTER — Other Ambulatory Visit: Payer: Self-pay

## 2017-05-30 ENCOUNTER — Other Ambulatory Visit: Payer: Self-pay | Admitting: Oncology

## 2017-05-30 ENCOUNTER — Ambulatory Visit: Payer: BLUE CROSS/BLUE SHIELD

## 2017-05-30 ENCOUNTER — Encounter (HOSPITAL_COMMUNITY): Payer: Self-pay

## 2017-05-30 DIAGNOSIS — C7989 Secondary malignant neoplasm of other specified sites: Secondary | ICD-10-CM | POA: Insufficient documentation

## 2017-05-30 DIAGNOSIS — C787 Secondary malignant neoplasm of liver and intrahepatic bile duct: Secondary | ICD-10-CM | POA: Diagnosis present

## 2017-05-30 DIAGNOSIS — Z66 Do not resuscitate: Secondary | ICD-10-CM

## 2017-05-30 DIAGNOSIS — R0603 Acute respiratory distress: Secondary | ICD-10-CM | POA: Diagnosis not present

## 2017-05-30 DIAGNOSIS — Z853 Personal history of malignant neoplasm of breast: Secondary | ICD-10-CM | POA: Diagnosis not present

## 2017-05-30 DIAGNOSIS — I10 Essential (primary) hypertension: Secondary | ICD-10-CM | POA: Diagnosis not present

## 2017-05-30 DIAGNOSIS — R4189 Other symptoms and signs involving cognitive functions and awareness: Secondary | ICD-10-CM | POA: Diagnosis present

## 2017-05-30 DIAGNOSIS — R0602 Shortness of breath: Secondary | ICD-10-CM | POA: Insufficient documentation

## 2017-05-30 DIAGNOSIS — Z79899 Other long term (current) drug therapy: Secondary | ICD-10-CM | POA: Insufficient documentation

## 2017-05-30 DIAGNOSIS — C50411 Malignant neoplasm of upper-outer quadrant of right female breast: Secondary | ICD-10-CM

## 2017-05-30 DIAGNOSIS — E872 Acidosis, unspecified: Secondary | ICD-10-CM

## 2017-05-30 DIAGNOSIS — C7951 Secondary malignant neoplasm of bone: Secondary | ICD-10-CM

## 2017-05-30 DIAGNOSIS — C799 Secondary malignant neoplasm of unspecified site: Secondary | ICD-10-CM

## 2017-05-30 DIAGNOSIS — C7800 Secondary malignant neoplasm of unspecified lung: Secondary | ICD-10-CM

## 2017-05-30 DIAGNOSIS — D696 Thrombocytopenia, unspecified: Secondary | ICD-10-CM | POA: Diagnosis present

## 2017-05-30 DIAGNOSIS — Z171 Estrogen receptor negative status [ER-]: Secondary | ICD-10-CM

## 2017-05-30 DIAGNOSIS — D649 Anemia, unspecified: Secondary | ICD-10-CM

## 2017-05-30 DIAGNOSIS — Z515 Encounter for palliative care: Secondary | ICD-10-CM

## 2017-05-30 DIAGNOSIS — C50919 Malignant neoplasm of unspecified site of unspecified female breast: Secondary | ICD-10-CM | POA: Diagnosis present

## 2017-05-30 LAB — CBC WITH DIFFERENTIAL/PLATELET
Basophils Absolute: 0 10*3/uL (ref 0.0–0.1)
Basophils Relative: 1 %
EOS ABS: 0 10*3/uL (ref 0.0–0.7)
Eosinophils Relative: 1 %
HCT: 18.8 % — ABNORMAL LOW (ref 36.0–46.0)
Hemoglobin: 6.2 g/dL — CL (ref 12.0–15.0)
LYMPHS ABS: 0.5 10*3/uL — AB (ref 0.7–4.0)
Lymphocytes Relative: 17 %
MCH: 28.8 pg (ref 26.0–34.0)
MCHC: 33 g/dL (ref 30.0–36.0)
MCV: 87.4 fL (ref 78.0–100.0)
MONO ABS: 0.2 10*3/uL (ref 0.1–1.0)
MONOS PCT: 6 %
NEUTROS ABS: 2.1 10*3/uL (ref 1.7–7.7)
NRBC: 24 /100{WBCs} — AB
Neutrophils Relative %: 75 %
PLATELETS: 27 10*3/uL — AB (ref 150–400)
RBC: 2.15 MIL/uL — AB (ref 3.87–5.11)
RDW: 18.7 % — ABNORMAL HIGH (ref 11.5–15.5)
WBC: 2.8 10*3/uL — AB (ref 4.0–10.5)

## 2017-05-30 LAB — PROTIME-INR
INR: 1.34
PROTHROMBIN TIME: 16.4 s — AB (ref 11.4–15.2)

## 2017-05-30 LAB — I-STAT CHEM 8, ED
BUN: 27 mg/dL — AB (ref 6–20)
CALCIUM ION: 0.6 mmol/L — AB (ref 1.15–1.40)
Chloride: 99 mmol/L — ABNORMAL LOW (ref 101–111)
Creatinine, Ser: 1.4 mg/dL — ABNORMAL HIGH (ref 0.44–1.00)
Glucose, Bld: 144 mg/dL — ABNORMAL HIGH (ref 65–99)
HEMATOCRIT: 18 % — AB (ref 36.0–46.0)
HEMOGLOBIN: 6.1 g/dL — AB (ref 12.0–15.0)
Potassium: 4 mmol/L (ref 3.5–5.1)
Sodium: 136 mmol/L (ref 135–145)
TCO2: 20 mmol/L — ABNORMAL LOW (ref 22–32)

## 2017-05-30 LAB — URIC ACID: Uric Acid, Serum: 10.9 mg/dL — ABNORMAL HIGH (ref 2.3–6.6)

## 2017-05-30 LAB — LACTATE DEHYDROGENASE: LDH: 6476 U/L — AB (ref 98–192)

## 2017-05-30 LAB — COMPREHENSIVE METABOLIC PANEL
ALK PHOS: 79 U/L (ref 38–126)
ALT: 31 U/L (ref 14–54)
AST: 178 U/L — AB (ref 15–41)
Albumin: 2.5 g/dL — ABNORMAL LOW (ref 3.5–5.0)
Anion gap: 15 (ref 5–15)
BUN: 32 mg/dL — AB (ref 6–20)
CALCIUM: 4.2 mg/dL — AB (ref 8.9–10.3)
CO2: 19 mmol/L — ABNORMAL LOW (ref 22–32)
CREATININE: 1.48 mg/dL — AB (ref 0.44–1.00)
Chloride: 100 mmol/L — ABNORMAL LOW (ref 101–111)
GFR calc Af Amer: 46 mL/min — ABNORMAL LOW (ref >60)
GFR, EST NON AFRICAN AMERICAN: 39 mL/min — AB (ref >60)
GLUCOSE: 145 mg/dL — AB (ref 65–99)
POTASSIUM: 4.4 mmol/L (ref 3.5–5.1)
Sodium: 134 mmol/L — ABNORMAL LOW (ref 135–145)
TOTAL PROTEIN: 5.9 g/dL — AB (ref 6.5–8.1)
Total Bilirubin: 2 mg/dL — ABNORMAL HIGH (ref 0.3–1.2)

## 2017-05-30 LAB — I-STAT CG4 LACTIC ACID, ED: Lactic Acid, Venous: 4.76 mmol/L (ref 0.5–1.9)

## 2017-05-30 LAB — APTT: aPTT: 36 seconds (ref 24–36)

## 2017-05-30 LAB — MAGNESIUM: MAGNESIUM: 1.4 mg/dL — AB (ref 1.7–2.4)

## 2017-05-30 LAB — TROPONIN I: TROPONIN I: 0.05 ng/mL — AB (ref <0.03)

## 2017-05-30 LAB — BRAIN NATRIURETIC PEPTIDE: B Natriuretic Peptide: 20.9 pg/mL (ref 0.0–100.0)

## 2017-05-30 LAB — AMMONIA: AMMONIA: 25 umol/L (ref 9–35)

## 2017-05-30 MED ORDER — HALOPERIDOL LACTATE 2 MG/ML PO CONC
0.5000 mg | ORAL | Status: DC | PRN
  Filled 2017-05-30: qty 0.3

## 2017-05-30 MED ORDER — HALOPERIDOL 1 MG PO TABS: 0.5000 mg | ORAL_TABLET | ORAL | Status: DC | PRN

## 2017-05-30 MED ORDER — ACETAMINOPHEN 325 MG PO TABS: 650.0000 mg | ORAL_TABLET | Freq: Four times a day (QID) | ORAL | 0 refills | Status: AC | PRN

## 2017-05-30 MED ORDER — ALUM & MAG HYDROXIDE-SIMETH 200-200-20 MG/5ML PO SUSP: 30.0000 mL | Freq: Four times a day (QID) | ORAL | 0 refills | Status: AC | PRN

## 2017-05-30 MED ORDER — BIOTENE DRY MOUTH MT LIQD: 15.0000 mL | OROMUCOSAL | Status: DC | PRN

## 2017-05-30 MED ORDER — TEMAZEPAM 15 MG PO CAPS: 15.0000 mg | ORAL_CAPSULE | Freq: Every evening | ORAL | Status: DC | PRN

## 2017-05-30 MED ORDER — MORPHINE SULFATE (CONCENTRATE) 10 MG/0.5ML PO SOLN: 5.0000 mg | ORAL | Status: DC | PRN

## 2017-05-30 MED ORDER — DIPHENHYDRAMINE HCL 50 MG/ML IJ SOLN: 12.5000 mg | INTRAMUSCULAR | Status: DC | PRN

## 2017-05-30 MED ORDER — TEMAZEPAM 15 MG PO CAPS: 15.0000 mg | ORAL_CAPSULE | Freq: Every evening | ORAL | 0 refills | Status: AC | PRN

## 2017-05-30 MED ORDER — MORPHINE SULFATE (PF) 2 MG/ML IV SOLN: 2.0000 mg | INTRAVENOUS | Status: DC | PRN

## 2017-05-30 MED ORDER — IOPAMIDOL (ISOVUE-370) INJECTION 76%
INTRAVENOUS | Status: AC
  Filled 2017-05-30: qty 100

## 2017-05-30 MED ORDER — LORAZEPAM 2 MG/ML IJ SOLN: 1.0000 mg | INTRAMUSCULAR | Status: DC | PRN

## 2017-05-30 MED ORDER — MORPHINE SULFATE (CONCENTRATE) 10 MG/0.5ML PO SOLN: 5.0000 mg | ORAL | 0 refills | Status: AC | PRN

## 2017-05-30 MED ORDER — DIPHENHYDRAMINE HCL 50 MG/ML IJ SOLN: 12.5000 mg | INTRAMUSCULAR | 0 refills | Status: AC | PRN

## 2017-05-30 MED ORDER — HALOPERIDOL 0.5 MG PO TABS: 0.5000 mg | ORAL_TABLET | ORAL | Status: AC | PRN

## 2017-05-30 MED ORDER — POLYVINYL ALCOHOL 1.4 % OP SOLN: 1.0000 [drp] | Freq: Four times a day (QID) | OPHTHALMIC | 0 refills | Status: AC | PRN

## 2017-05-30 MED ORDER — ALBUTEROL SULFATE (2.5 MG/3ML) 0.083% IN NEBU: 2.5000 mg | INHALATION_SOLUTION | RESPIRATORY_TRACT | Status: DC | PRN

## 2017-05-30 MED ORDER — LORAZEPAM 1 MG PO TABS: 1.0000 mg | ORAL_TABLET | ORAL | 0 refills | Status: AC | PRN

## 2017-05-30 MED ORDER — ACETAMINOPHEN 650 MG RE SUPP: 650.0000 mg | Freq: Four times a day (QID) | RECTAL | Status: DC | PRN

## 2017-05-30 MED ORDER — BIOTENE DRY MOUTH MT LIQD: 15.0000 mL | OROMUCOSAL | 0 refills | Status: AC | PRN

## 2017-05-30 MED ORDER — POLYVINYL ALCOHOL 1.4 % OP SOLN: 1.0000 [drp] | Freq: Four times a day (QID) | OPHTHALMIC | Status: DC | PRN

## 2017-05-30 MED ORDER — HALOPERIDOL LACTATE 5 MG/ML IJ SOLN: 0.5000 mg | INTRAMUSCULAR | Status: DC | PRN

## 2017-05-30 MED ORDER — BISACODYL 10 MG RE SUPP: 10.0000 mg | Freq: Every day | RECTAL | 0 refills | Status: AC | PRN

## 2017-05-30 MED ORDER — SODIUM CHLORIDE 0.9 % IV SOLN
10.0000 mL/h | Freq: Once | INTRAVENOUS | Status: AC
  Administered 2017-05-30: 10 mL/h via INTRAVENOUS

## 2017-05-30 MED ORDER — LORAZEPAM 2 MG/ML PO CONC: 1.0000 mg | ORAL | Status: DC | PRN

## 2017-05-30 MED ORDER — MORPHINE SULFATE (PF) 2 MG/ML IV SOLN: 2.0000 mg | INTRAVENOUS | 0 refills | Status: AC | PRN

## 2017-05-30 MED ORDER — BISACODYL 10 MG RE SUPP: 10.0000 mg | Freq: Every day | RECTAL | Status: DC | PRN

## 2017-05-30 MED ORDER — MORPHINE SULFATE (PF) 2 MG/ML IV SOLN
2.0000 mg | Freq: Once | INTRAVENOUS | Status: AC
  Administered 2017-05-30: 2 mg via INTRAVENOUS
  Filled 2017-05-30: qty 1

## 2017-05-30 MED ORDER — LORAZEPAM 2 MG/ML IJ SOLN
1.0000 mg | Freq: Once | INTRAMUSCULAR | Status: AC
  Administered 2017-05-30: 1 mg via INTRAVENOUS
  Filled 2017-05-30: qty 1

## 2017-05-30 MED ORDER — LORAZEPAM 2 MG/ML IJ SOLN: 1.0000 mg | INTRAMUSCULAR | 0 refills | Status: AC | PRN

## 2017-05-30 MED ORDER — LORAZEPAM 1 MG PO TABS: 1.0000 mg | ORAL_TABLET | ORAL | Status: DC | PRN

## 2017-05-30 MED ORDER — ONDANSETRON 4 MG PO TBDP: 4.0000 mg | ORAL_TABLET | Freq: Four times a day (QID) | ORAL | Status: DC | PRN

## 2017-05-30 MED ORDER — ONDANSETRON 4 MG PO TBDP: 4.0000 mg | ORAL_TABLET | Freq: Four times a day (QID) | ORAL | 0 refills | Status: AC | PRN

## 2017-05-30 MED ORDER — ALUM & MAG HYDROXIDE-SIMETH 200-200-20 MG/5ML PO SUSP: 30.0000 mL | Freq: Four times a day (QID) | ORAL | Status: DC | PRN

## 2017-05-30 MED ORDER — ALBUTEROL SULFATE (2.5 MG/3ML) 0.083% IN NEBU: 2.5000 mg | INHALATION_SOLUTION | RESPIRATORY_TRACT | 12 refills | Status: AC | PRN

## 2017-05-30 MED ORDER — ACETAMINOPHEN 325 MG PO TABS: 650.0000 mg | ORAL_TABLET | Freq: Four times a day (QID) | ORAL | Status: DC | PRN

## 2017-05-30 MED ORDER — ONDANSETRON HCL 4 MG/2ML IJ SOLN: 4.0000 mg | Freq: Four times a day (QID) | INTRAMUSCULAR | Status: DC | PRN

## 2017-05-30 NOTE — ED Notes (Signed)
Date and time results received: 05/30/17  (use smartphrase ".now" to insert current time)  Test: TROPONIN Critical Value: 0.05  Name of Provider Notified: NANAVATI  Orders Received? Or Actions Taken?:  NO ORDERS GIVEN AT PRESENT

## 2017-05-30 NOTE — Discharge Instructions (Signed)
Discharge to hospice home in [place]-with comfort measures only.  Patient needs symptom management.  DNR/DNI status

## 2017-05-30 NOTE — ED Provider Notes (Signed)
9:31 AM care transferred to me.  Patient tells me that she is comfortable declines any pain medicine or other supportive care at this time.  Palliative care returned a call and states that there is no available beds at [facility].  However they have discussed with Education officer, museum and we will look for other places nearby such as [facility] or [employer].  Family would be comfortable with this but otherwise would prefer comfort care in the hospital until a place arises.  1:22 PM patient has been accepted to the hospice facility and [facility].  She and family are comfortable with this plan.  She will be transported by EMS.  She continues to decline pain medicine or other treatments in the ER.   Sherwood Gambler, MD 05/30/17 1322

## 2017-05-30 NOTE — Progress Notes (Signed)
Palliative Medicine RN Note: Consult order noted. Patient's family would like her to not be admitted but instead to go straight to inpatient hospice. I spoke with both Dr Kathrynn Humble and Dr Regenia Skeeter; referral to SW for inpt hospice referral placed. I spoke with ED SW. PMT will sign off at this time, as goals are clear and SW is involved for d/c planning.  Please reconsult if new needs arise or if the situation changes and requires PMT involvement. For now, it looks like patient will be going to [facility].  Marjie Skiff Lennard Capek, RN, BSN, [facility] Palliative Medicine Team 05/30/2017 12:16 PM Office [phone]

## 2017-05-30 NOTE — ED Triage Notes (Signed)
Pt to ed via ems, found apathic in bed, and family said she was unresponsiveness.  Pt has a hx of cancer, port, on chemo  Pt is sob and grunting. Pt has rhonchi  Becomes more alert during transport.  V/son arrival 120/64, hr 136, spo2 92 4 liters 02. cbg 177. Labor breathing rr 26.  Iv in right hand 22/. Comes from home and family on the way.

## 2017-05-30 NOTE — Progress Notes (Signed)
Patient able to discharge to [place]. Patient will need to be transported via [facility]. Please call report to [phone]. Family, EDP, MD, and RN all updated.   Kingsley Spittle, [facility] Emergency Room Clinical Social Worker [phone]

## 2017-05-30 NOTE — Progress Notes (Signed)
Lindsay [name]   DOB:06-16-1965   UX#:[id number]   GUR#:[id number]  Subjective:  Lindsay [name] had an appointment with me for follow-up of her metastatic breast cancer.  She has been receiving CMF chemotherapy, but the treatments have been very delayed and interrupted because of intercurrent problems.  She only received 1 cycle, on 04/06/2017, and further treatments had to be repeatedly postponed.  Today instead of a visit I found some documents from hospice to be signed.This was [facility].  This is the hospice service that I am not familiar with  Subsequently I learned that Lindsay [name] was in the emergency room and I stopped by to see her.   Objective: Middle-aged African-American woman examined in bed, with son and mother at bedside Vitals:   05/30/17 1121 05/30/17 1200  BP: 100/65 121/71  Pulse: (!) 128 (!) 127  Resp: (!) 30 (!) 33  Temp:    SpO2: 95% 98%    There is no height or weight on file to calculate BMI. No intake or output data in the 24 hours ending 05/30/17 1326    Lungs no rales or wheezes--auscultated anterolaterally  Heart regular rate and rhythm  Abdomen soft, +BS  Neuro opens eyes and nods to voice and touch  CBG (last 3)  No results for input(s): GLUCAP in the last 72 hours.   Labs:  Lab Results  Component Value Date   WBC 2.8 (L) 05/30/2017   HGB 6.1 (LL) 05/30/2017   HCT 18.0 (L) 05/30/2017   MCV 87.4 05/30/2017   PLT 27 (LL) 05/30/2017   NEUTROABS 2.1 05/30/2017    @LASTCHEMISTRY @  Urine Studies No results for input(s): UHGB, CRYS in the last 72 hours.  Invalid input(s): UACOL, UAPR, USPG, UPH, UTP, UGL, UKET, UBIL, UNIT, UROB, ULEU, UEPI, UWBC, URBC, UBAC, CAST, UCOM, BILUA  Basic Metabolic Panel: Recent Labs  Lab 05/30/17 0724 05/30/17 0759  NA 134* 136  K 4.4 4.0  CL 100* 99*  CO2 19*  --   GLUCOSE 145* 144*  BUN 32* 27*  CREATININE 1.48* 1.40*  CALCIUM 4.2*  --   MG 1.4*  --    GFR Estimated Creatinine Clearance: 45.2 mL/min (A) (by  C-G formula based on SCr of 1.4 mg/dL (H)). Liver Function Tests: Recent Labs  Lab 05/30/17 0724  AST 178*  ALT 31  ALKPHOS 79  BILITOT 2.0*  PROT 5.9*  ALBUMIN 2.5*   No results for input(s): LIPASE, AMYLASE in the last 168 hours. Recent Labs  Lab 05/30/17 0724  AMMONIA 25   Coagulation profile Recent Labs  Lab 05/30/17 0724  INR 1.34    CBC: Recent Labs  Lab 05/30/17 0724 05/30/17 0759  WBC 2.8*  --   NEUTROABS 2.1  --   HGB 6.2* 6.1*  HCT 18.8* 18.0*  MCV 87.4  --   PLT 27*  --    Cardiac Enzymes: Recent Labs  Lab 05/30/17 0724  TROPONINI 0.05*   BNP: Invalid input(s): POCBNP CBG: No results for input(s): GLUCAP in the last 168 hours. D-Dimer No results for input(s): DDIMER in the last 72 hours. Hgb A1c No results for input(s): HGBA1C in the last 72 hours. Lipid Profile No results for input(s): CHOL, HDL, LDLCALC, TRIG, CHOLHDL, LDLDIRECT in the last 72 hours. Thyroid function studies No results for input(s): TSH, T4TOTAL, T3FREE, THYROIDAB in the last 72 hours.  Invalid input(s): FREET3 Anemia work up No results for input(s): VITAMINB12, FOLATE, FERRITIN, TIBC, IRON, RETICCTPCT in the last 72 hours.  Microbiology No results found for this or any previous visit (from the past 240 hour(s)).    Studies:  Dg Chest Port 1 View  Result Date: 05/30/2017 CLINICAL DATA:  Found unresponsive in bed. History of breast cancer on chemotherapy. EXAM: PORTABLE CHEST 1 VIEW COMPARISON:  04/25/2017 FINDINGS: Right IJ Port-A-Cath unchanged. Lungs are adequately inflated with moderate opacification over the right mid to lower lung likely a moderate size effusion with associated atelectasis. Cannot exclude superimposed infection within the right mid to lower lung. Minimal left base opacification likely small effusions/atelectasis. Cardiomediastinal silhouette and remainder of the exam is unchanged. IMPRESSION: Opacification over the right mid to lower lung likely  moderate size effusion with atelectasis as superimposed infection is possible. Mild left base opacification likely small effusions/atelectasis. Right IJ Port-A-Cath unchanged. Electronically Signed   By: Marin Olp M.D.   On: 05/30/2017 08:00    Assessment: 52 y.o. [place] woman status post right breast upper outer quadrant biopsy 12/14/2015 for a clinical T1c N0, stage IA invasive ductal carcinoma, grade 3, triple negative,with an MIB-1 of 70% (a) this stages as IIB In the 2018 new Prognostic classification  (1) genetics testing negative for mutations within any of 20 genes on the Breast/Ovarian Cancer Panel through [employer]. Additionally, no VUS were found.   (2) neoadjuvant chemotherapy consisting of doxorubicin and cyclophosphamide in dose this fashion 4, completed 02/22/2016, followed by paclitaxel weekly 5 (of 12 treatments planned), starting 03/08/2016, stopped 04/05/2016 after cycle 5 because of neuropathy (a) cycle 4 of cyclophosphamide and doxorubicin held 1 week because of side effects  (3) status post right lumpectomy and sentinel lymph node sampling 05/05/2016 for a residual ypT2 ypN) invasive ductal carcinoma, grade 3, the repeat prognostic panel again triple negative the residual cancer burden was 2  (4) adjuvant radiation 06/20/16 - 08/04/16 : 50.4 Gy to the right breast plus a 10 Gy boost  (5) left thyroid nodule biopsy 04/14/2016 read as [place] 1  METASTATIC DISEASE: October 2018 (6) MRI of the abdomen March 01, 2017 shows multiple lesions in the lungs, liver, and bones (a) the tumor is PD-L1 negative--not a candidate for pembrolizumab              (b) CA 27-29 not informative  (7) started capecitabine March 14, 2017;  (a) discontinued 03/19/2017 with multiple side effects              (8) started denosumab/Xgeva March 10, 2017, most recent dose 04/21/2017             (a) complicated by  severe hypocalcemia  (9) pain syndrome: .control complicated by nausea/vomiting on hydromorphone, urinary retention on hydromorphone/fentanyl, generally better pain control on fentanyl  (10) startedCMF chemotherapy 04/06/2017, to be repeated every 21 days             (a) cycle 2 delayed due to admission for uncontrolled pain; next cycle 05/18/2017  (11) severe anemia--no B-12, folate or iron deficiency; very elevated LDH and mild hyperbilirubinemia but negative Coombs and normal haptoglobin; no schistocytes on smear; retics 79.4; nl creatinine (a) RBC underproduction secondary to cancer and chemo; transfuse PRN  (12) electrolyte abnormalities-- being corrected.  (13) opted for supportive/comfort care only, referred to hospice   Plan:  The family would prefer to be treated under hospice and palliative care [facility], but apparently there were no beds so Lindsay [name] will be taken care of by [facility] [place].  This is in [facility] [employer].  Aside from that inconvenience, I  reassured the family that this is a nonprofit hospice and that they do a very good job.  I will be glad to function as the patient's hospice associated MD.  I agree with the plan for comfort care and DO NOT RESUSCITATE orders.  Please let me know if I can be of further help   Bobetta Lime, MD 05/30/2017  1:26 PM Medical Oncology and Hematology [facility] [address] [place], [place] [address] Tel. [phone]    Fax. [phone]

## 2017-05-30 NOTE — ED Notes (Signed)
Bed: RESB Expected date:  Expected time:  Means of arrival:  Comments: EMS Code Sepsis-found apneic BP 60/43

## 2017-05-30 NOTE — Progress Notes (Addendum)
UPDATE 9:50AM: CSW received call from RN Liaison with [facility] does have beds available for today. Liaison will meet with patient/ family today at 12PM to go over further details.   CSW aware of consult. [place] unable to accept patient today however family agreeable to [facility] [place] and potentially [place]. CSW will reach out to these facilities.   Will continue to update.   Kingsley Spittle, [facility] Emergency Room Clinical Social Worker [phone]

## 2017-05-30 NOTE — Progress Notes (Signed)
No show-- patient was seen in the ED and will be transferred to [facility]/inpatient care

## 2017-05-30 NOTE — Consult Note (Signed)
[facility]: Met with pt's husband and extended family to review goals of care and hospice philosophy. The pt's family and husband are wanting to focus on pure comfort care. Discussed with Dr. Ander Purpura our hospice MD and she agrees pt is appropriate for the Hospice home in [facility]. Offered the bed and pt husband has accepted. RN Loma Sousa called ambulance and filled out the transport form for ambulance. Webb Silversmith RN [phone]

## 2017-05-30 NOTE — Consult Note (Signed)
Patient Demographics:    Lindsay [name], is a 52 y.o. female  MRN: [id number]   DOB - 07/31/1965  Admit Date - 05/30/2017  Outpatient Primary MD for the patient is Bernerd Limbo, MD   Assessment & Plan:    Principal Problem:   Metastatic breast cancer [facility]) Active Problems:   Malignant neoplasm of upper-outer quadrant of right breast in female, estrogen receptor negative ([place])   Bone metastases ([place])   Liver metastases ([place])   Lung metastases ([place])   Encounter for palliative care   Thrombocytopenia ([place])   Symptomatic anemia    1) metastatic breast cancer with metastatic to the bones, liver and lungs-patient is more symptomatic, requires admission to inpatient hospice house for further symptom management  2)Disposition- Discharge to hospice home in [place]-with comfort measures only.  Patient needs symptom management.  DNR/DNI status  3)Social- Ethics-extensive conversations with patient, husband, her mother, and HER-2 sisters at bedside.  At this time patient is a DNR/DNI with comfort measures only, hospice input appreciated, awaiting transfer to hospice house for symptom management   Electrolyte abnormalities and blood dyscrasia noted, at this time family declines further blood draws , imaging, further workup or further  treatment modalities.  They have decided on hospice care with comfort measures only  With History of - Reviewed by me  Past Medical History:  Diagnosis Date  . Allergy    seasonal  . Arthritis   . Breast cancer of upper-outer quadrant of right female breast ([place]) 12/16/2015  . Depression    mild depression after death of daughter- no meds  . History of radiation therapy 06/20/17-08/04/16   right breast 50.4 gy in 28 fractions, boost 10 Gy in 5 fractions  . Hot flashes   . Hx of  adenomatous colonic polyps 04/25/2014  . Hypertension       Past Surgical History:  Procedure Laterality Date  . ABDOMINAL HYSTERECTOMY    . BREAST LUMPECTOMY WITH RADIOACTIVE SEED AND SENTINEL LYMPH NODE BIOPSY Right 05/05/2016   Procedure: BREAST LUMPECTOMY WITH RADIOACTIVE SEED AND SENTINEL LYMPH NODE BIOPSY;  Surgeon: Rolm Bookbinder, MD;  Location: [place];  Service: General;  Laterality: Right;  . CHOLECYSTECTOMY N/A 09/09/2015   Procedure: LAPAROSCOPIC CHOLECYSTECTOMY;  Surgeon: Ralene Ok, MD;  Location: [facility];  Service: General;  Laterality: N/A;  . COLONOSCOPY    . KNEE ARTHROSCOPY  2016  . PERIPHERAL VASCULAR CATHETERIZATION Right 05/05/2016   Procedure: PORTA CATH REMOVAL;  Surgeon: Rolm Bookbinder, MD;  Location: [place];  Service: General;  Laterality: Right;  . PORTACATH PLACEMENT Right 12/29/2015   Procedure: INSERTION PORT-A-CATH WITH ULTRASOUND GUIDANCE;  Surgeon: Rolm Bookbinder, MD;  Location: [place];  Service: General;  Laterality: Right;  . PORTACATH PLACEMENT Right 04/06/2017   Procedure: INSERTION PORT-A-CATH WITH [place];  Surgeon: Rolm Bookbinder, MD;  Location: [place];  Service: General;  Laterality: Right;      Chief Complaint  Patient presents with  . Shortness of Breath  . Respiratory Distress      HPI:    Lindsay [name]  is a 52 y.o. female with past medical history relevant for metastatic breast cancer with metastatic to the bones, liver and lungs-patient is more symptomatic this time.  Patient has increased pain, poor sleep, confusional episodes.   Initial ED workup revealed electrolyte abnormalities and blood dyscrasias,  at this time family declines further blood draws , imaging, further workup or further  treatment modalities.  They have decided on hospice care with comfort measures only   Family requesting hospice/comfort care only with DNR/DNI status at this time.      Review of systems:    In  addition to the HPI above,   A full 12 point Review of 10 Systems was done, except as stated above, all other Review of 10 Systems were negative.    Social History:  Reviewed by me    Social History   Tobacco Use  . Smoking status: Never Smoker  . Smokeless tobacco: Never Used  Substance Use Topics  . Alcohol use: No    Alcohol/week: 0.0 oz       Family History :  Reviewed by me    Family History  Problem Relation Age of Onset  . Hypertension Mother   . Breast cancer Maternal Grandfather   . Breast cancer Other   . Breast cancer Cousin   . Sudden death Neg Hx   . Diabetes Neg Hx   . Heart attack Neg Hx   . Hyperlipidemia Neg Hx   . Colon cancer Neg Hx   . Esophageal cancer Neg Hx   . Rectal cancer Neg Hx   . Stomach cancer Neg Hx      Home Medications:   Prior to Admission medications   Medication Sig Start Date End Date Taking? Authorizing Provider  acetaminophen (TYLENOL) 325 MG tablet Take 2 tablets (650 mg total) by mouth every 6 (six) hours as needed for mild pain (or Fever >/= 101). 05/30/17   Bosco Paparella, MD  albuterol (PROVENTIL) (2.5 MG/3ML) 0.083% nebulizer solution Take 3 mLs (2.5 mg total) by nebulization every 2 (two) hours as needed for wheezing. 05/30/17   Roxan Hockey, MD  alum & mag hydroxide-simeth (MAALOX/MYLANTA) 200-200-20 MG/5ML suspension Take 30 mLs by mouth every 6 (six) hours as needed for indigestion or heartburn (dyspepsia). 05/30/17   Roxan Hockey, MD  antiseptic oral rinse (BIOTENE) LIQD Apply 15 mLs topically as needed for dry mouth. 05/30/17   Oddie Kuhlmann, MD  bisacodyl (DULCOLAX) 10 MG suppository Place 1 suppository (10 mg total) rectally daily as needed for moderate constipation. 05/30/17   Roxan Hockey, MD  diphenhydrAMINE (BENADRYL) 12.5 MG/5ML elixir Take 5 mLs (12.5 mg total) by mouth every 6 (six) hours as needed for itching. Patient not taking: Reported on 05/30/2017 05/03/17   Robbie Lis, MD    diphenhydrAMINE (BENADRYL) 50 MG/ML injection Inject 0.25 mLs (12.5 mg total) into the vein every 4 (four) hours as needed for itching. 05/30/17   Roxan Hockey, MD  haloperidol (HALDOL) 0.5 MG tablet Take 1 tablet (0.5 mg total) by mouth every 4 (four) hours as needed for agitation (or delirium). 05/30/17   Roxan Hockey, MD  LORazepam (ATIVAN) 1 MG tablet Take 1 tablet (1 mg total) by mouth every 4 (four) hours as needed for anxiety. 05/30/17   Roxan Hockey, MD  LORazepam (ATIVAN) 2 MG/ML injection Inject 0.5 mLs (1 mg  total) into the vein every 4 (four) hours as needed for seizure. 05/30/17   Roxan Hockey, MD  morphine 2 MG/ML injection Inject 1 mL (2 mg total) into the vein every 4 (four) hours as needed. 05/30/17   Roxan Hockey, MD  Morphine Sulfate (MORPHINE CONCENTRATE) 10 MG/0.5ML SOLN concentrated solution Take 0.25 mLs (5 mg total) by mouth every 2 (two) hours as needed for moderate pain (or dyspnea). 05/30/17   Roxan Hockey, MD  ondansetron (ZOFRAN-ODT) 4 MG disintegrating tablet Take 1 tablet (4 mg total) by mouth every 6 (six) hours as needed for nausea. 05/30/17   Roxan Hockey, MD  polyvinyl alcohol (LIQUIFILM TEARS) 1.4 % ophthalmic solution Place 1 drop into both eyes 4 (four) times daily as needed for dry eyes. 05/30/17   Roxan Hockey, MD  temazepam (RESTORIL) 15 MG capsule Take 1 capsule (15 mg total) by mouth at bedtime as needed for sleep. 05/30/17   Roxan Hockey, MD     Allergies:     Allergies  Allergen Reactions  . Gabapentin Other (See Comments)    "light headed, dizziness, didn't like the way it made me feel"  . Dilaudid [Hydromorphone Hcl] Nausea And Vomiting  . Percocet [Oxycodone-Acetaminophen] Diarrhea and Nausea And Vomiting  . Pollen Extract Other (See Comments)    Runny nose, itchy eyes and sneezing due to seasonal allergies.  . Tramadol Nausea And Vomiting  . Vicodin [Hydrocodone-Acetaminophen] Nausea And Vomiting     Physical  Exam:   Vitals  Blood pressure 121/71, pulse (!) 127, temperature 98.4 F (36.9 C), temperature source Oral, resp. rate (!) 33, SpO2 98 %.  Physical Examination: General appearance -  ill appearing Mental status -awake but disoriented  eyes - exothalmos Neck - supple, no JVD elevation , Chest -diminished in bases without wheezing,  heart - S1 and S2 normal, tachycardic heart rate 120s Abdomen - soft, bowel sounds noted, mildly uncomfortable with palpation Neurological -confused from time to time disoriented from time to time no tremors Extremities -  pedal edema noted, intact peripheral pulses  Skin - warm, dry Psych-intermittent confusion and disorientation   Data Review:    CBC Recent Labs  Lab 05/30/17 0724 05/30/17 0759  WBC 2.8*  --   HGB 6.2* 6.1*  HCT 18.8* 18.0*  PLT 27*  --   MCV 87.4  --   MCH 28.8  --   MCHC 33.0  --   RDW 18.7*  --   LYMPHSABS 0.5*  --   MONOABS 0.2  --   EOSABS 0.0  --   BASOSABS 0.0  --    ------------------------------------------------------------------------------------------------------------------  Chemistries  Recent Labs  Lab 05/30/17 0724 05/30/17 0759  NA 134* 136  K 4.4 4.0  CL 100* 99*  CO2 19*  --   GLUCOSE 145* 144*  BUN 32* 27*  CREATININE 1.48* 1.40*  CALCIUM 4.2*  --   MG 1.4*  --   AST 178*  --   ALT 31  --   ALKPHOS 79  --   BILITOT 2.0*  --    ------------------------------------------------------------------------------------------------------------------ estimated creatinine clearance is 45.2 mL/min (A) (by C-G formula based on SCr of 1.4 mg/dL (H)). ------------------------------------------------------------------------------------------------------------------ No results for input(s): TSH, T4TOTAL, T3FREE, THYROIDAB in the last 72 hours.  Invalid input(s): FREET3   Coagulation profile Recent Labs  Lab 05/30/17 0724  INR 1.34    ------------------------------------------------------------------------------------------------------------------- No results for input(s): DDIMER in the last 72 hours. -------------------------------------------------------------------------------------------------------------------  Cardiac Enzymes Recent Labs  Lab 05/30/17  0724  TROPONINI 0.05*   ------------------------------------------------------------------------------------------------------------------    Component Value Date/Time   BNP 20.9 05/30/2017 0724     ---------------------------------------------------------------------------------------------------------------  Urinalysis    Component Value Date/Time   COLORURINE AMBER (A) 05/19/2017 0640   APPEARANCEUR CLEAR 05/19/2017 0640   LABSPEC 1.015 05/19/2017 0640   PHURINE 6.0 05/19/2017 0640   GLUCOSEU NEGATIVE 05/19/2017 0640   HGBUR NEGATIVE 05/19/2017 0640   BILIRUBINUR NEGATIVE 05/19/2017 0640   KETONESUR 20 (A) 05/19/2017 0640   PROTEINUR 30 (A) 05/19/2017 0640   UROBILINOGEN 1.0 03/03/2015 1240   NITRITE NEGATIVE 05/19/2017 0640   LEUKOCYTESUR NEGATIVE 05/19/2017 0640    ----------------------------------------------------------------------------------------------------------------   Imaging Results:    Dg Chest Port 1 View  Result Date: 05/30/2017 CLINICAL DATA:  Found unresponsive in bed. History of breast cancer on chemotherapy. EXAM: PORTABLE CHEST 1 VIEW COMPARISON:  04/25/2017 FINDINGS: Right IJ Port-A-Cath unchanged. Lungs are adequately inflated with moderate opacification over the right mid to lower lung likely a moderate size effusion with associated atelectasis. Cannot exclude superimposed infection within the right mid to lower lung. Minimal left base opacification likely small effusions/atelectasis. Cardiomediastinal silhouette and remainder of the exam is unchanged. IMPRESSION: Opacification over the right mid to lower lung likely  moderate size effusion with atelectasis as superimposed infection is possible. Mild left base opacification likely small effusions/atelectasis. Right IJ Port-A-Cath unchanged. Electronically Signed   By: Marin Olp M.D.   On: 05/30/2017 08:00    Radiological Exams on Admission: Dg Chest Port 1 View  Result Date: 05/30/2017 CLINICAL DATA:  Found unresponsive in bed. History of breast cancer on chemotherapy. EXAM: PORTABLE CHEST 1 VIEW COMPARISON:  04/25/2017 FINDINGS: Right IJ Port-A-Cath unchanged. Lungs are adequately inflated with moderate opacification over the right mid to lower lung likely a moderate size effusion with associated atelectasis. Cannot exclude superimposed infection within the right mid to lower lung. Minimal left base opacification likely small effusions/atelectasis. Cardiomediastinal silhouette and remainder of the exam is unchanged. IMPRESSION: Opacification over the right mid to lower lung likely moderate size effusion with atelectasis as superimposed infection is possible. Mild left base opacification likely small effusions/atelectasis. Right IJ Port-A-Cath unchanged. Electronically Signed   By: Marin Olp M.D.   On: 05/30/2017 08:00   Discharge Instructions    Bed rest   Complete by:  As directed    Diet general   Complete by:  As directed    Comfort feeding as desired   Discharge instructions   Complete by:  As directed    Discharge to hospice home in [place]-with comfort measures only.  Patient needs symptom management.  DNR/DNI status   Discharge to hospice home in [place]-with comfort measures only.  Patient needs symptom management.  DNR/DNI status      Family Communication: Admission, patients condition and plan of care including tests being ordered have been discussed with the patient and husband, mother and 2 sisters at bedside who indicate understanding and agree with the plan   Code Status - DNR  Likely DC to Hospice House  Condition    prognosis is grave  Roxan Hockey M.D on 05/30/2017 at 12:49 PM   Between 7am to 7pm - Pager - [phone] After 7pm go to [email or web address] - password [email or web address]  [facility] - Office  [phone]  Voice Recognition Viviann Spare dictation system was used to create this note, attempts have been made to correct errors. Please contact the author with questions and/or clarifications.

## 2017-05-30 NOTE — ED Provider Notes (Signed)
[facility] DEPT Provider Note   CSN: [id number] Arrival date & time: 05/30/17  3149     History   Chief Complaint Chief Complaint  Patient presents with  . Shortness of Breath  . Respiratory Distress    HPI Lindsay [name] is a 52 y.o. female.  HPI 52 year old female with history of metastatic breast cancer, with metastases to bone and liver comes in with chief complaint of shortness of breath and unresponsiveness.  Patient reports that she is having shortness of breath for the last 2 or 3 days.  According to family, this morning patient was found to be unresponsive.  When they shook the patient she opened her eyes and had a blank stare.  Patient had no seizure-like activity.  Patient denies any headaches, neck pain, chest pain, cough, nausea, vomiting, abdominal pain, fevers, chills.  Patient is noted to be tachycardic and hypoxic.  In the recent past patient has required admission to the hospital for pancytopenia and required blood transfusion.  Patient does not have any history of PE/DVT.  Patient is seeing hospice team for home health resources.  At this time patient is full code.  Past Medical History:  Diagnosis Date  . Allergy    seasonal  . Arthritis   . Breast cancer of upper-outer quadrant of right female breast ([place]) 12/16/2015  . Depression    mild depression after death of daughter- no meds  . History of radiation therapy 06/20/17-08/04/16   right breast 50.4 gy in 28 fractions, boost 10 Gy in 5 fractions  . Hot flashes   . Hx of adenomatous colonic polyps 04/25/2014  . Hypertension     Patient Active Problem List   Diagnosis Date Noted  . Port-A-Cath in place 05/23/2017  . Diarrhea 05/18/2017  . Depression 05/18/2017  . Thrombocytopenia ([place]) 05/18/2017  . Thrush 05/18/2017  . Symptomatic anemia 05/18/2017  . Anemia of chronic disease 05/18/2017  . Dehydration 05/18/2017  . Pain   . Encounter for palliative care   .  Urinary retention 04/25/2017  . Hypocalcemia 04/25/2017  . Hypokalemia 04/25/2017  . Hypomagnesemia 04/25/2017  . Goals of care, counseling/discussion 03/24/2017  . Bone metastases ([place]) 03/03/2017  . Liver metastases ([place]) 03/03/2017  . Lung metastases ([place]) 03/03/2017  . Genetic testing 03/20/2016  . Family history of breast cancer in female 02/10/2016  . Malignant neoplasm of upper-outer quadrant of right breast in female, estrogen receptor negative ([place]) 12/16/2015  . Hx of adenomatous colonic polyps 04/25/2014  . Neck pain 11/06/2012    Past Surgical History:  Procedure Laterality Date  . ABDOMINAL HYSTERECTOMY    . BREAST LUMPECTOMY WITH RADIOACTIVE SEED AND SENTINEL LYMPH NODE BIOPSY Right 05/05/2016   Procedure: BREAST LUMPECTOMY WITH RADIOACTIVE SEED AND SENTINEL LYMPH NODE BIOPSY;  Surgeon: Rolm Bookbinder, MD;  Location: [facility];  Service: General;  Laterality: Right;  . CHOLECYSTECTOMY N/A 09/09/2015   Procedure: LAPAROSCOPIC CHOLECYSTECTOMY;  Surgeon: Ralene Ok, MD;  Location: [facility];  Service: General;  Laterality: N/A;  . COLONOSCOPY    . KNEE ARTHROSCOPY  2016  . PERIPHERAL VASCULAR CATHETERIZATION Right 05/05/2016   Procedure: PORTA CATH REMOVAL;  Surgeon: Rolm Bookbinder, MD;  Location: [facility];  Service: General;  Laterality: Right;  . PORTACATH PLACEMENT Right 12/29/2015   Procedure: INSERTION PORT-A-CATH WITH ULTRASOUND GUIDANCE;  Surgeon: Rolm Bookbinder, MD;  Location: [facility];  Service: General;  Laterality: Right;  . PORTACATH PLACEMENT Right 04/06/2017   Procedure: INSERTION  PORT-A-CATH WITH [place];  Surgeon: Rolm Bookbinder, MD;  Location: [facility];  Service: General;  Laterality: Right;    OB History    No data available       Home Medications    Prior to Admission medications   Medication Sig Start Date End Date Taking? Authorizing Provider  ALPRAZolam Duanne Moron) 0.5 MG tablet Take 1 tablet (0.5 mg total) by  mouth daily as needed for anxiety or sleep. 05/03/17  Yes Robbie Lis, MD  chlorhexidine (PERIDEX) 0.12 % solution 15 mLs by Mouth Rinse route 2 (two) times daily. 05/03/17  Yes Robbie Lis, MD  dexamethasone (DECADRON) 4 MG tablet Take 2 tablets (8 mg total) by mouth daily. Start the day after chemotherapy for 2 days. Take with food. 04/06/17  Yes Magrinat, Virgie Dad, MD  EPINEPHrine (EPIPEN 2-PAK) 0.3 mg/0.3 mL IJ SOAJ injection Inject 0.3 mg into the muscle once as needed (anaphylaxis allergic reaction).  01/01/14  Yes [provider]  fentaNYL (DURAGESIC - DOSED MCG/HR) 25 MCG/HR patch Place 1 patch (25 mcg total) onto the skin every 3 (three) days. 05/03/17  Yes Robbie Lis, MD  HYDROmorphone (DILAUDID) 4 MG tablet Take 1 tablet (4 mg total) by mouth every 2 (two) hours as needed for severe pain. 05/18/17  Yes Causey, Charlestine Massed, NP  diphenhydrAMINE (BENADRYL) 12.5 MG/5ML elixir Take 5 mLs (12.5 mg total) by mouth every 6 (six) hours as needed for itching. Patient not taking: Reported on 05/30/2017 05/03/17   Robbie Lis, MD  fluconazole (DIFLUCAN) 200 MG tablet Take 1 tablet (200 mg total) by mouth daily. Patient not taking: Reported on 05/30/2017 05/18/17   Gardenia Phlegm, NP  lidocaine-prilocaine (EMLA) cream Apply to affected area once Patient not taking: Reported on 05/30/2017 04/06/17   Magrinat, Virgie Dad, MD  ondansetron (ZOFRAN) 8 MG tablet Take 1 tablet (8 mg total) by mouth 2 (two) times daily as needed for refractory nausea / vomiting. Start on day 3 after chemotherapy. Patient not taking: Reported on 05/30/2017 05/03/17   Robbie Lis, MD  potassium chloride (K-DUR) 10 MEQ tablet Take 2 tablets (20 mEq total) by mouth 2 (two) times daily. Patient not taking: Reported on 05/30/2017 05/18/17   Gardenia Phlegm, NP  venlafaxine XR (EFFEXOR-XR) 150 MG 24 hr capsule Take 1 capsule (150 mg total) by mouth daily. Patient not taking: Reported on  05/30/2017 04/13/17   Gardenia Phlegm, NP    Family History Family History  Problem Relation Age of Onset  . Hypertension Mother   . Breast cancer Maternal Grandfather   . Breast cancer Other   . Breast cancer Cousin   . Sudden death Neg Hx   . Diabetes Neg Hx   . Heart attack Neg Hx   . Hyperlipidemia Neg Hx   . Colon cancer Neg Hx   . Esophageal cancer Neg Hx   . Rectal cancer Neg Hx   . Stomach cancer Neg Hx     Social History Social History   Tobacco Use  . Smoking status: Never Smoker  . Smokeless tobacco: Never Used  Substance Use Topics  . Alcohol use: No    Alcohol/week: 0.0 oz  . Drug use: No     Allergies   Gabapentin; Dilaudid [hydromorphone hcl]; Percocet [oxycodone-acetaminophen]; Pollen extract; Tramadol; and Vicodin [hydrocodone-acetaminophen]   Review of Systems Review of Systems  Constitutional: Positive for activity change.  Respiratory: Positive for shortness of breath.   All other systems  reviewed and are negative.    Physical Exam Updated Vital Signs BP (!) 127/95 (BP Location: Left Arm)   Pulse (!) 128   Temp 98.4 F (36.9 C) (Oral)   Resp (!) 28   SpO2 96%   Physical Exam  Constitutional: She is oriented to person, place, and time. She appears well-developed.  HENT:  Head: Normocephalic and atraumatic.  Eyes: EOM are normal.  Neck: Normal range of motion. Neck supple.  Cardiovascular:  Tachycardia  Pulmonary/Chest: Tachypnea noted. She is in respiratory distress.  Abdominal: Bowel sounds are normal.  Neurological: She is alert and oriented to person, place, and time.  Skin: Skin is warm and dry. Capillary refill takes less than 2 seconds.  Nursing note and vitals reviewed.    ED Treatments / Results  Labs (all labs ordered are listed, but only abnormal results are displayed) Labs Reviewed  COMPREHENSIVE METABOLIC PANEL - Abnormal; Notable for the following components:      Result Value   Sodium 134 (*)     Chloride 100 (*)    CO2 19 (*)    Glucose, Bld 145 (*)    BUN 32 (*)    Creatinine, Ser 1.48 (*)    Calcium 4.2 (*)    Total Protein 5.9 (*)    Albumin 2.5 (*)    AST 178 (*)    Total Bilirubin 2.0 (*)    GFR calc non Af Amer 39 (*)    GFR calc Af Amer 46 (*)    All other components within normal limits  CBC WITH DIFFERENTIAL/PLATELET - Abnormal; Notable for the following components:   WBC 2.8 (*)    RBC 2.15 (*)    Hemoglobin 6.2 (*)    HCT 18.8 (*)    RDW 18.7 (*)    Platelets 27 (*)    nRBC 24 (*)    Lymphs Abs 0.5 (*)    All other components within normal limits  PROTIME-INR - Abnormal; Notable for the following components:   Prothrombin Time 16.4 (*)    All other components within normal limits  TROPONIN I - Abnormal; Notable for the following components:   Troponin I 0.05 (*)    All other components within normal limits  MAGNESIUM - Abnormal; Notable for the following components:   Magnesium 1.4 (*)    All other components within normal limits  LACTATE DEHYDROGENASE - Abnormal; Notable for the following components:   LDH 6,476 (*)    All other components within normal limits  URIC ACID - Abnormal; Notable for the following components:   Uric Acid, Serum 10.9 (*)    All other components within normal limits  I-STAT CG4 LACTIC ACID, ED - Abnormal; Notable for the following components:   Lactic Acid, Venous 4.76 (*)    All other components within normal limits  I-STAT CHEM 8, ED - Abnormal; Notable for the following components:   Chloride 99 (*)    BUN 27 (*)    Creatinine, Ser 1.40 (*)    Glucose, Bld 144 (*)    Calcium, Ion 0.60 (*)    TCO2 20 (*)    Hemoglobin 6.1 (*)    HCT 18.0 (*)    All other components within normal limits  AMMONIA  APTT  BRAIN NATRIURETIC PEPTIDE  I-STAT CG4 LACTIC ACID, ED  TYPE AND SCREEN  PREPARE RBC (CROSSMATCH)    EKG  EKG Interpretation None       Radiology Dg Chest [facility] 1 [address]  Result Date: 05/30/2017 CLINICAL  DATA:  Found unresponsive in bed. History of breast cancer on chemotherapy. EXAM: PORTABLE CHEST 1 VIEW COMPARISON:  04/25/2017 FINDINGS: Right IJ Port-A-Cath unchanged. Lungs are adequately inflated with moderate opacification over the right mid to lower lung likely a moderate size effusion with associated atelectasis. Cannot exclude superimposed infection within the right mid to lower lung. Minimal left base opacification likely small effusions/atelectasis. Cardiomediastinal silhouette and remainder of the exam is unchanged. IMPRESSION: Opacification over the right mid to lower lung likely moderate size effusion with atelectasis as superimposed infection is possible. Mild left base opacification likely small effusions/atelectasis. Right IJ Port-A-Cath unchanged. Electronically Signed   By: Marin Olp M.D.   On: 05/30/2017 08:00    Procedures Procedures (including critical care time)  CRITICAL CARE Performed by: Tria Noguera   Total critical care time: 51 minutes  Critical care time was exclusive of separately billable procedures and treating other patients.  Critical care was necessary to treat or prevent imminent or life-threatening deterioration.  Critical care was time spent personally by me on the following activities: development of treatment plan with patient and/or surrogate as well as nursing, discussions with consultants, evaluation of patient's response to treatment, examination of patient, obtaining history from patient or surrogate, ordering and performing treatments and interventions, ordering and review of laboratory studies, ordering and review of radiographic studies, pulse oximetry and re-evaluation of patient's condition.   Medications Ordered in ED Medications  0.9 %  sodium chloride infusion (not administered)  iopamidol (ISOVUE-370) 76 % injection (not administered)     Initial Impression / Assessment and Plan / ED Course  I have reviewed the triage vital signs  and the nursing notes.  Pertinent labs & imaging results that were available during my care of the patient were reviewed by me and considered in my medical decision making (see chart for details).  Clinical Course as of May 30 925  Tue May 30, 2017  1027 The patient is noted to have a lactate>4. With the current information available to me, I don't think the patient is in septic shock. The lactate>4, is related to respiratory distress/ respiratory failure / work of breathing.  Lactic Acid, Venous: (!!) 4.76 [AN]  [id number] Transfusion for 2 units ordered. Hemoglobin: (!!) 6.1 [AN]  0900 Patient has severe hypocalcemia.  IV calcium to be ordered Calcium: (!!) 4.2 [AN]  [id number] Patient's mother now at bedside.  Both her and the patient's husband are requesting palliative admission.  Patient is already informed them that she would like to die, that she is tired of being sick.  They are already contacted hospice, and were thinking about transitioning to comfort care after their meeting tomorrow.  They do not see any utility in patient getting chemo, and transient Band-Aid-like treatments.  They would prefer that patient be admitted to hospice care or admitted as comfort care in the hospital..... Rather than go home.  CODE STATUS to be DNR/DNI/comfort care only.  I have paged the hospice team, and they will try to coordinate a discharge from the ER to [facility].  [AN]    Clinical Course User Index [AN] Varney Biles, MD   Pt comes in with respiratory distress. Pt has metastatic breast CA, with mets to liver and bones. Patient is noted to be tachycardic, and dyspneic.  Differential diagnosis includes large pleural effusion, pneumonia, PE.  Chest x-ray and CT PE ordered.  Patient also has history of severe anemia that has required transfusion, and  the dyspnea could be because of severe anemia as well.  Goals of care discussion initiated with the patient and the family.  Patient is full code at this  time.  I informed the family that they might want to consider DNR/DNI as an option moving forward.  Explained to them that DNR/DNI does not mean do not treat, and the husband is going to discuss the [place] with family and the patient..  Palliative care consultation has been placed.  Final Clinical Impressions(s) / ED Diagnoses   Final diagnoses:  Acute respiratory distress  Symptomatic anemia  Lactic acidosis  Metastatic cancer [facility])  Hypocalcemia    ED Discharge Orders    None       Varney Biles, MD 05/30/17 [phone]

## 2017-05-30 NOTE — ED Notes (Signed)
Bed: UU82 Expected date:  Expected time:  Means of arrival:  Comments: RES B- Pallative

## 2017-05-30 NOTE — ED Notes (Signed)
Date and time results received: 05/30/17  (use smartphrase ".now" to insert current time)  Test: CALCIUM Critical Value: 4.2  Name of Provider Notified: NANAVATI  Orders Received? Or Actions Taken?:  NO ORDERS REC'D AT PRESENT

## 2017-06-02 ENCOUNTER — Telehealth: Payer: Self-pay | Admitting: *Deleted

## 2017-06-02 ENCOUNTER — Other Ambulatory Visit: Payer: Self-pay | Admitting: Oncology

## 2017-06-03 ENCOUNTER — Encounter: Payer: Self-pay | Admitting: Oncology

## 2017-06-08 ENCOUNTER — Other Ambulatory Visit: Payer: BLUE CROSS/BLUE SHIELD

## 2017-06-08 ENCOUNTER — Ambulatory Visit: Payer: BLUE CROSS/BLUE SHIELD

## 2017-06-09 NOTE — Telephone Encounter (Signed)
This RN received call from Butch Penny at the [facility] wanting to inform Dr Jana Hakim of patients passing. Family is presently with her body.  Butch Penny states death certificate will be signed by [facility] attending physician.  No other needs at this time.  This RN also received call from [facility] - stating passing of Jalexa " and wanted to let you know ".  This RN gave condolences to Oliver as well as validated the care he provided to his wife.  This note will be forwarded to MD and medical records ( for documentation in Epic).

## 2017-06-09 DEATH — deceased

## 2017-06-20 ENCOUNTER — Ambulatory Visit: Payer: Self-pay | Admitting: Radiation Oncology

## 2017-06-29 ENCOUNTER — Other Ambulatory Visit: Payer: BLUE CROSS/BLUE SHIELD

## 2017-06-29 ENCOUNTER — Ambulatory Visit: Payer: BLUE CROSS/BLUE SHIELD

## 2017-07-05 NOTE — Telephone Encounter (Signed)
No entry 

## 2017-07-07 DEATH — deceased

## 2017-07-20 ENCOUNTER — Other Ambulatory Visit: Payer: Self-pay | Admitting: Nurse Practitioner

## 2018-03-24 IMAGING — CR DG LUMBAR SPINE COMPLETE 4+V
5 series · 5 of 5 positions shown · non-contrast
Comparison: None

CLINICAL DATA: Low back pain and LEFT greater than RIGHT hip pain
for 4 weeks, no known injury

EXAM:
LUMBAR SPINE - COMPLETE 4+ VIEW

[t lumbar spine ap]
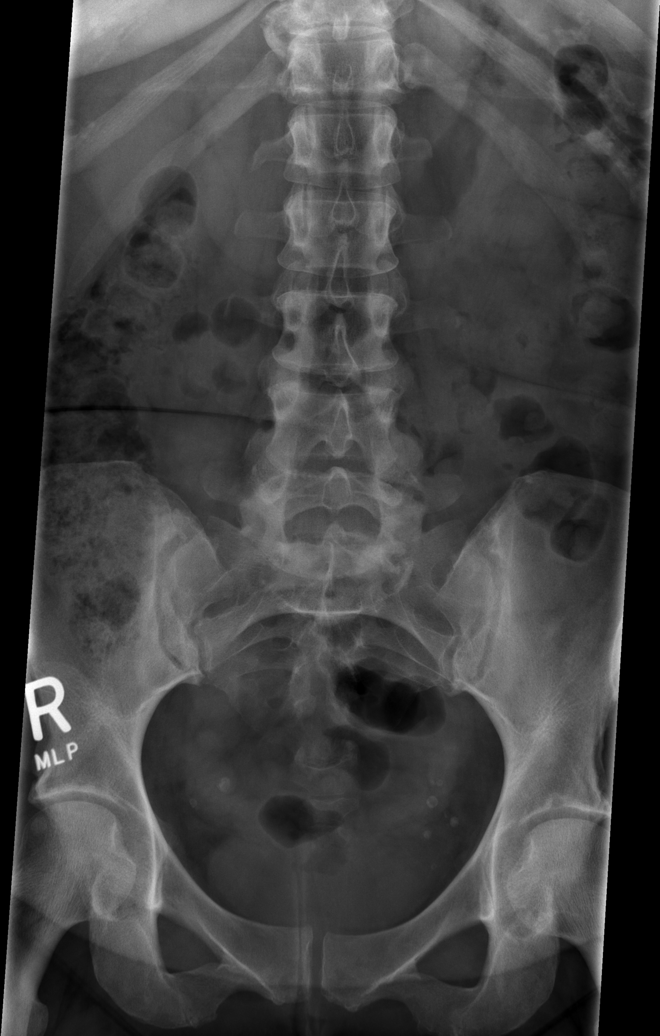

[t lumbar spine obl (1 of 2)]
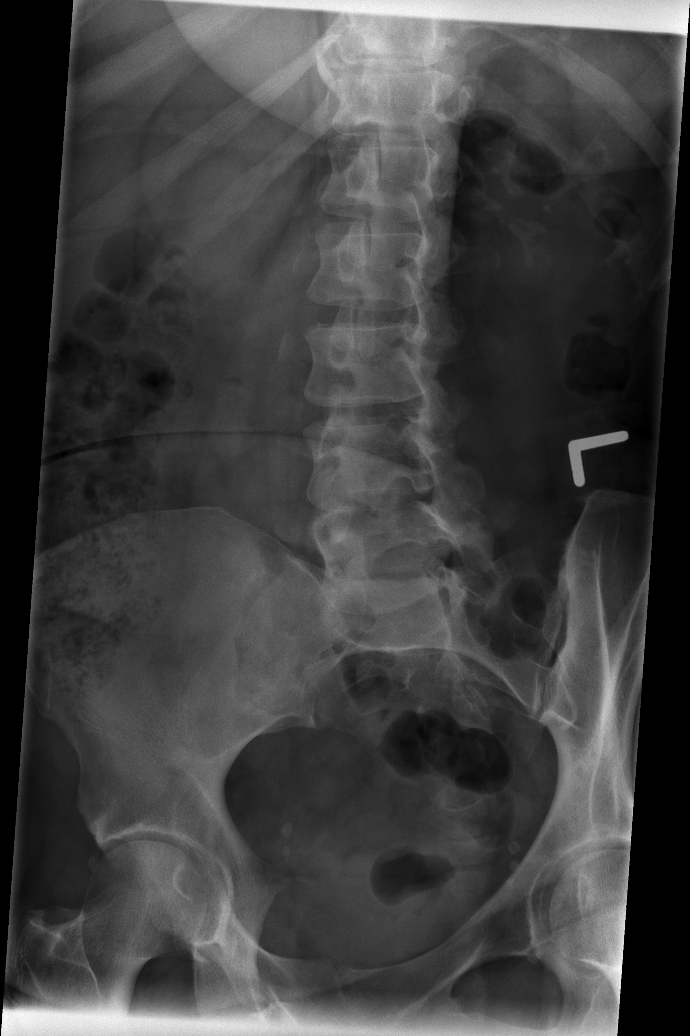

[t lumbar spine obl (2 of 2)]
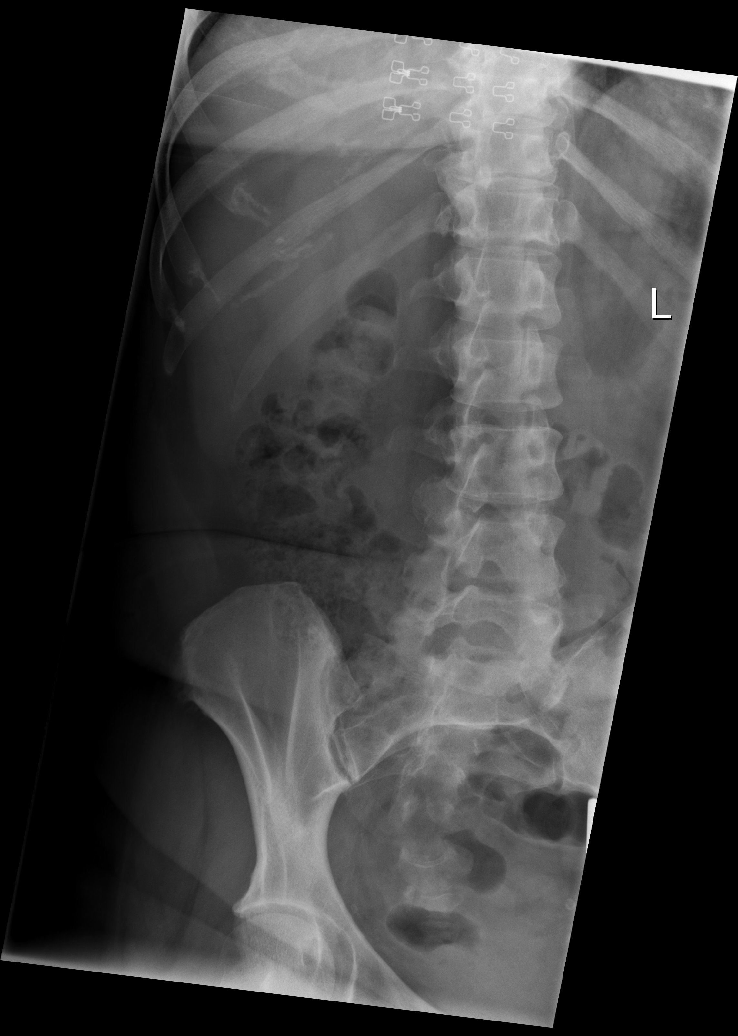

[t lumbar spine lat]
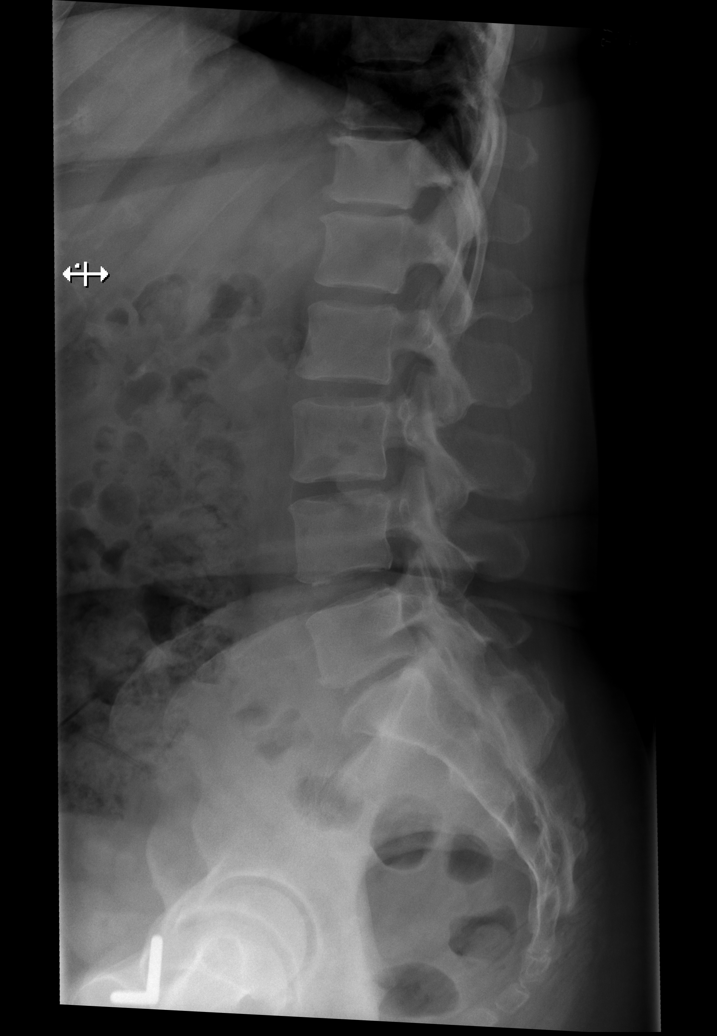

[t lumbar l-5 s-1 spot]
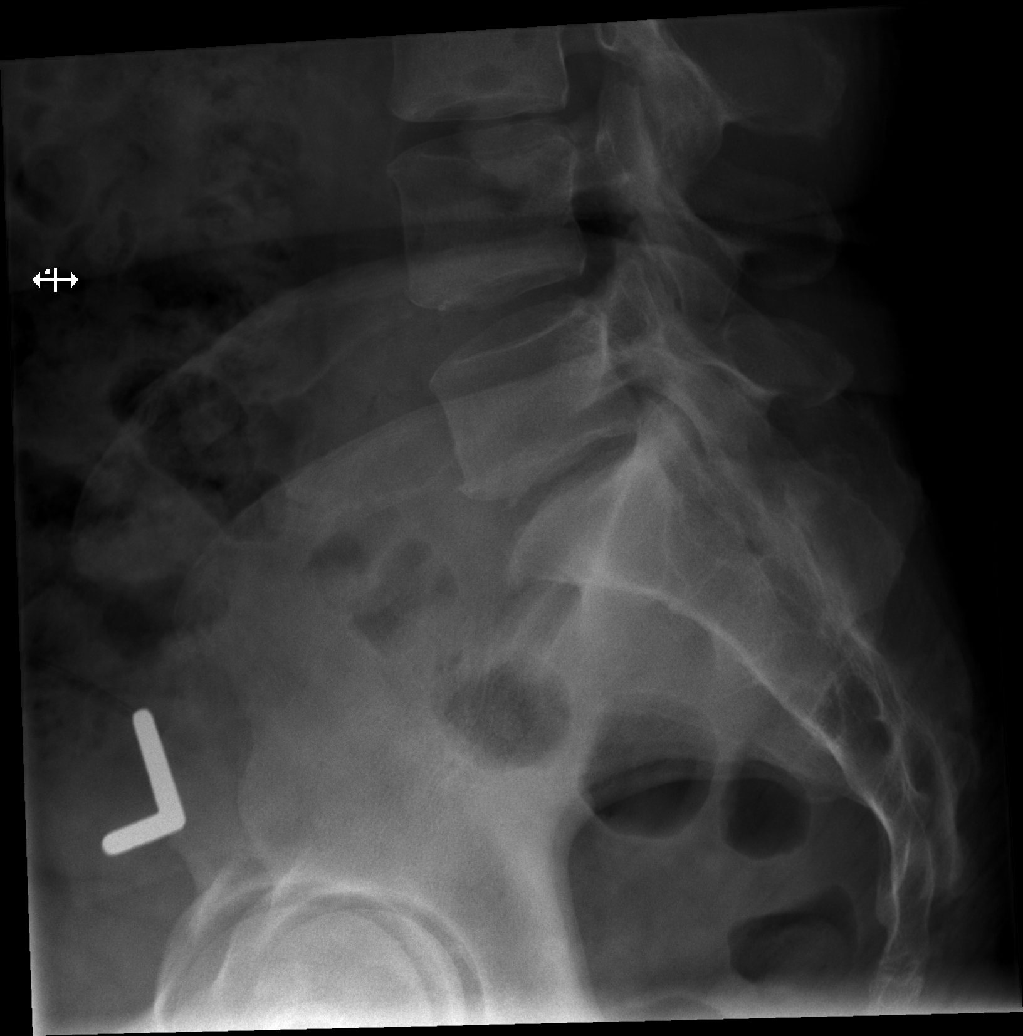

[5 of 5 positions shown; findings below may reference images not displayed]

FINDINGS: Five non-rib-bearing lumbar vertebra.

Osseous mineralization normal for technique.

Vertebral body heights maintained.

Slight disc space narrowing at T11-T12 and T12-L1 with tiny endplate
spurs.

No acute fracture, subluxation or bone destruction.

No spondylolysis.

SI joints preserved.
IMPRESSION: Mild degenerative disc disease changes at thoracolumbar junction.

No acute bony abnormalities.

## 2018-06-05 IMAGING — DX DG HIP (WITH OR WITHOUT PELVIS) 2-3V*L*
3 series · 3 of 3 positions shown · non-contrast
Comparison: None.

CLINICAL DATA: Acute onset of severe left hip pain which began this
morning. No known injury. Current history of stage IV breast cancer
for which the patient receiving chemotherapy.

EXAM:
DG HIP (WITH OR WITHOUT PELVIS) 2-3V LEFT

[pelvis ap]
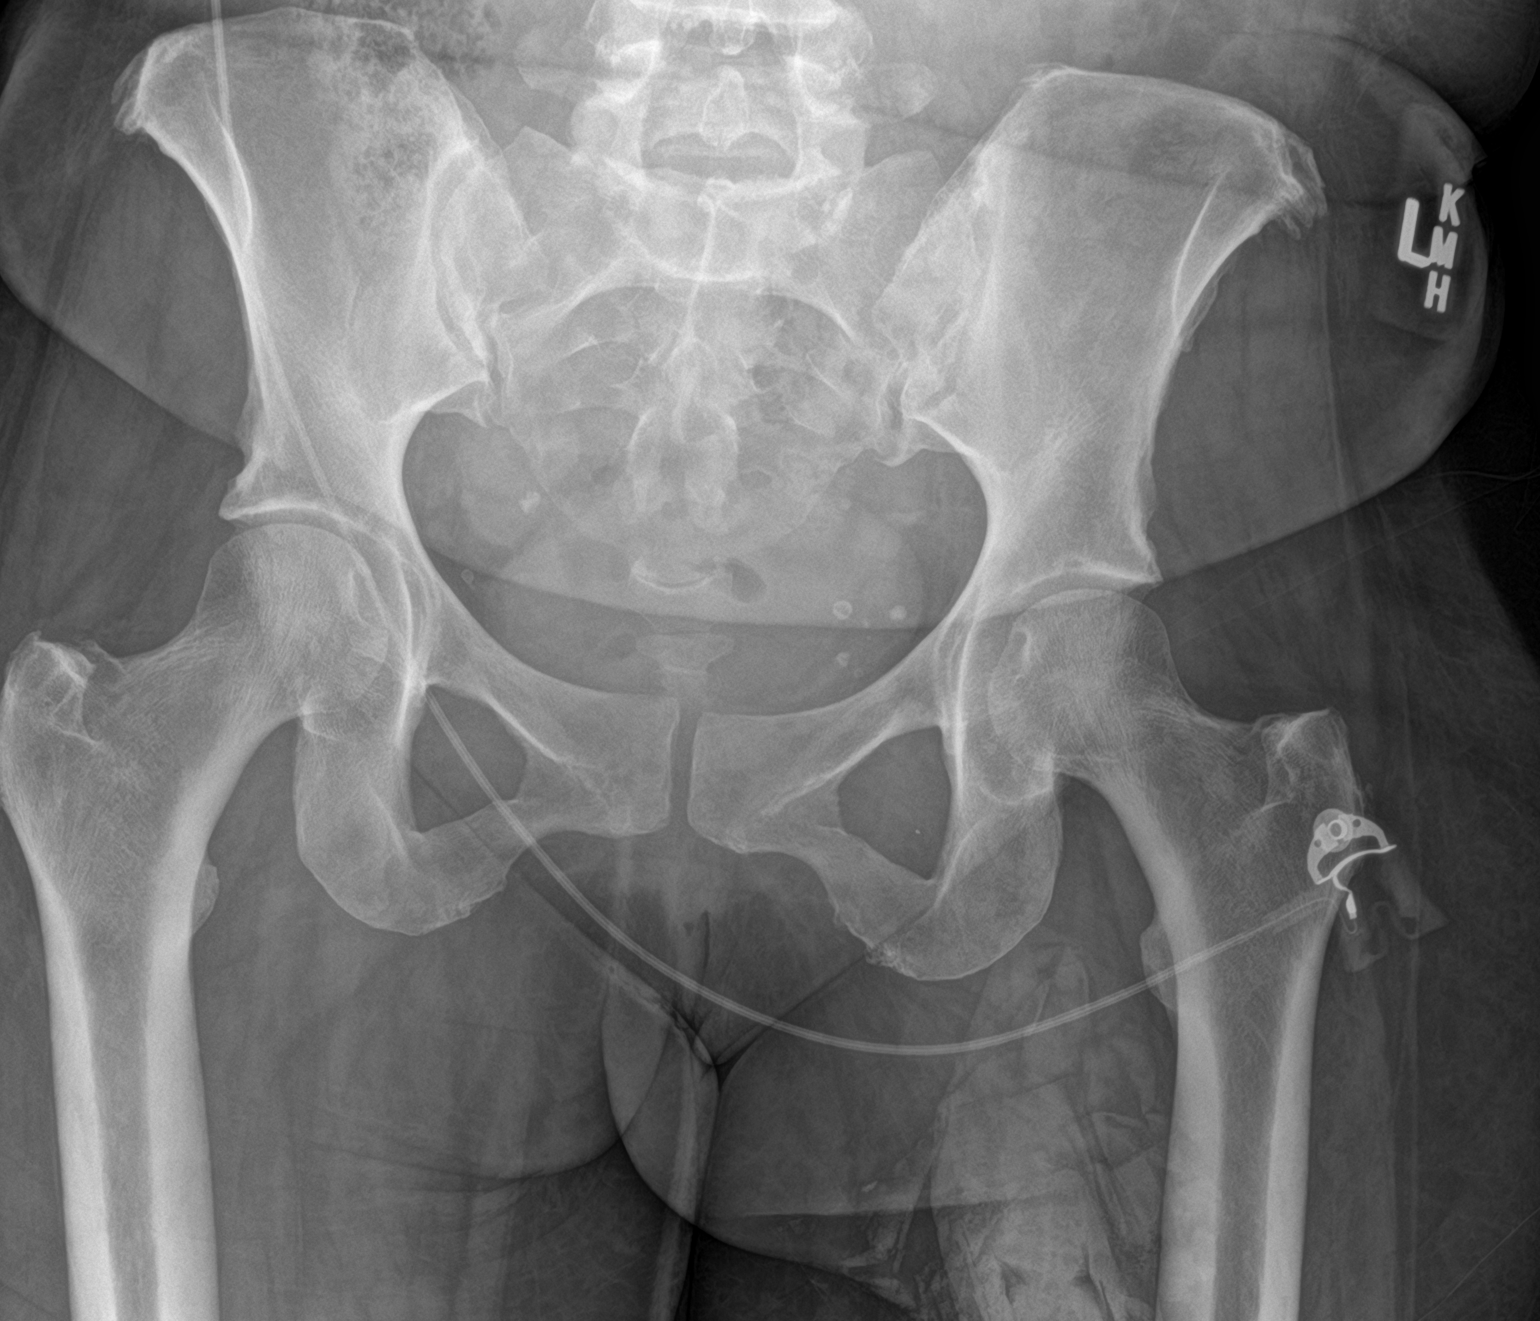

[hip ap]
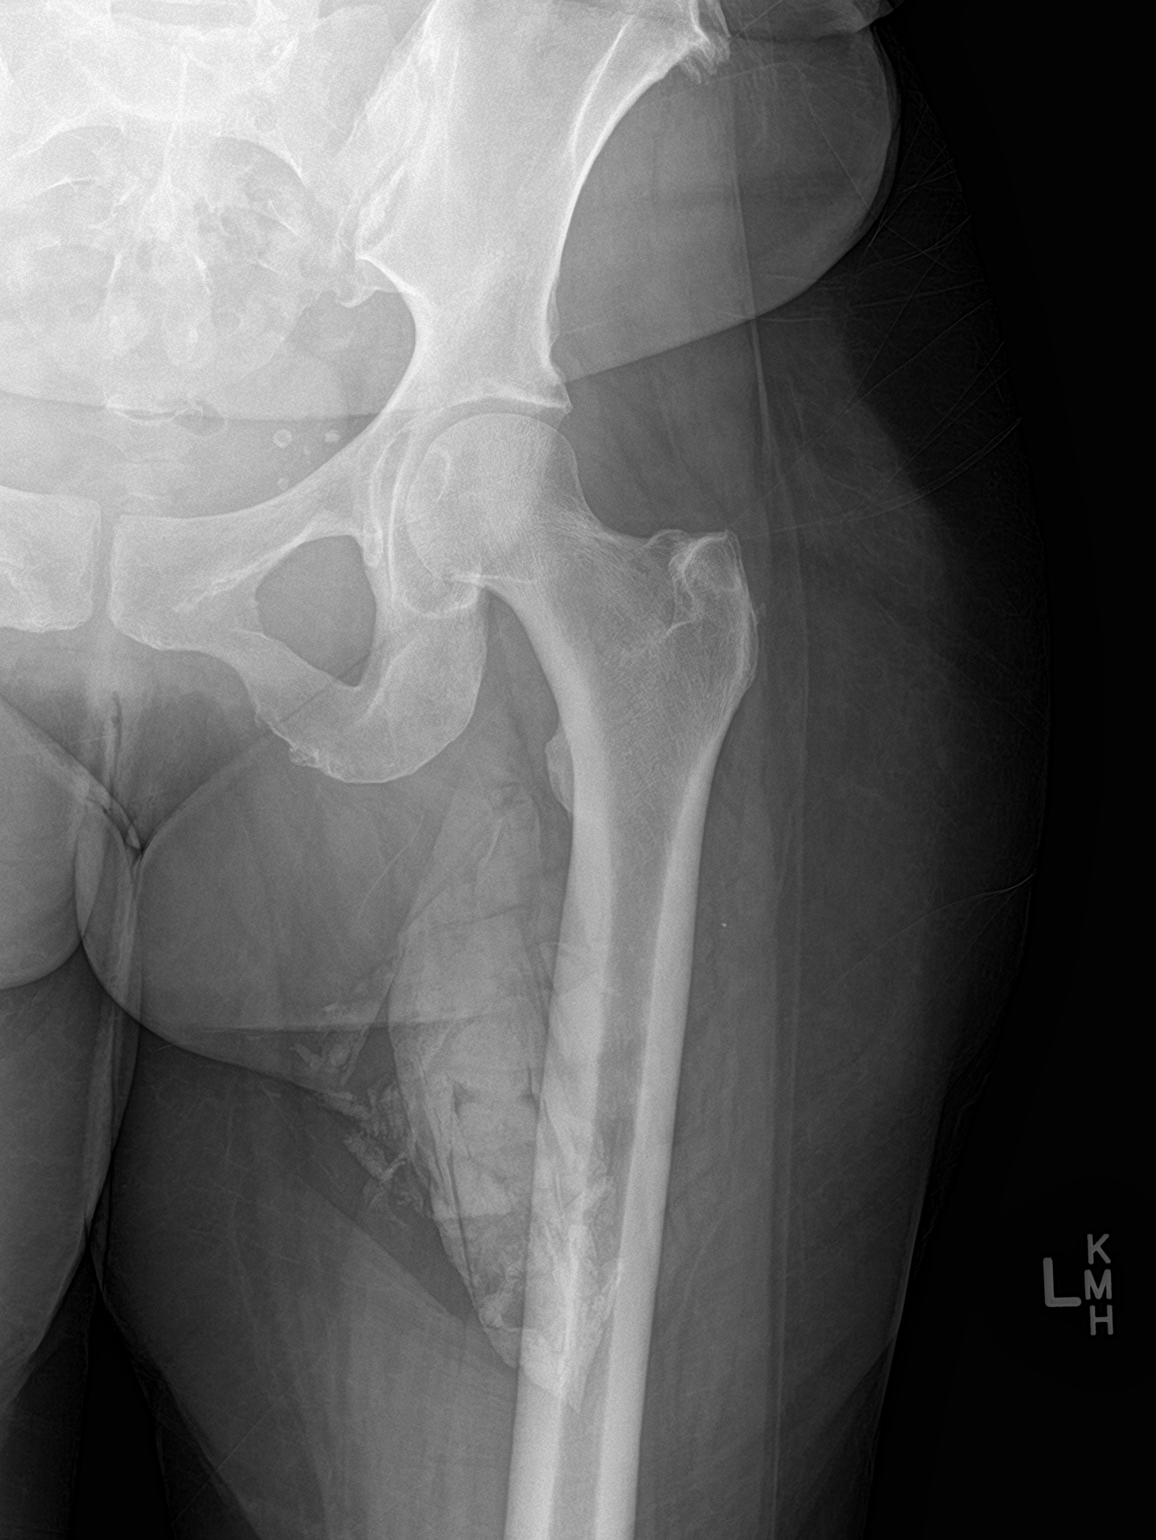

[hip lat]
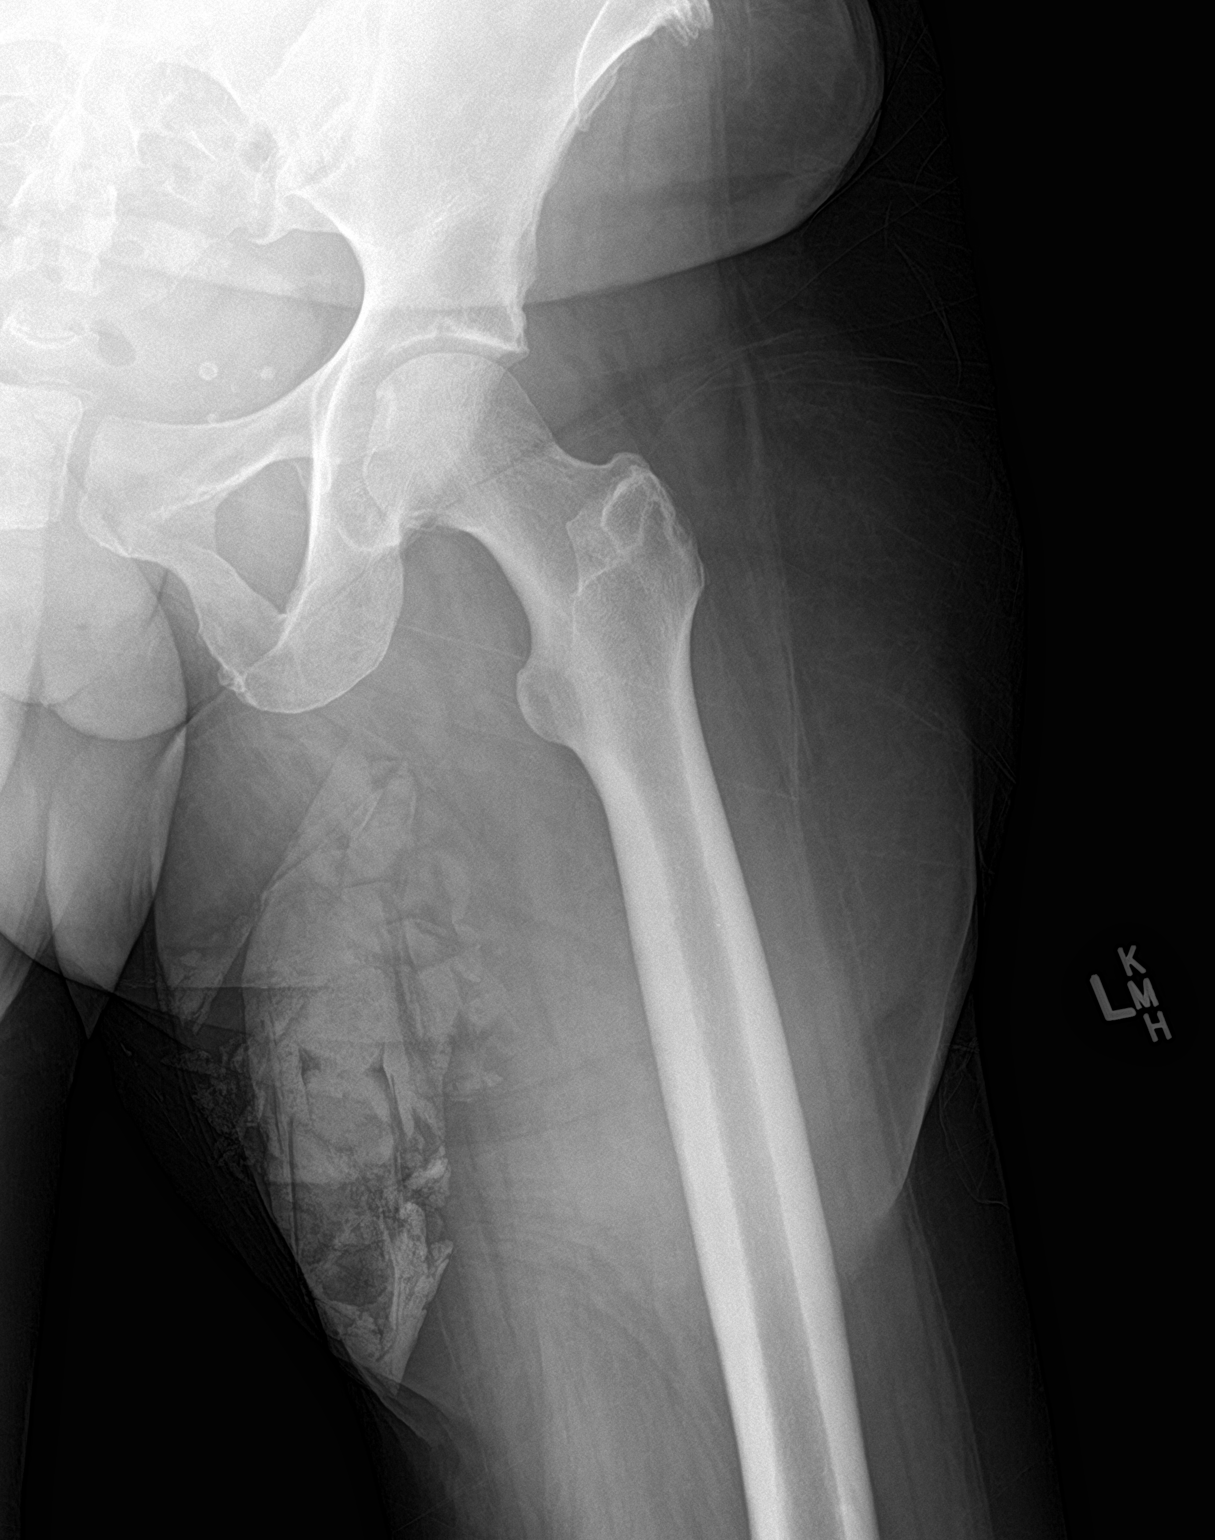

[3 of 3 positions shown; findings below may reference images not displayed]

FINDINGS: No evidence of acute fracture or dislocation. Well preserved joint
space. Well preserved bone mineral density. No intrinsic osseous
abnormality.

Included AP pelvis demonstrates a normal-appearing contralateral
right hip. Sacroiliac joints and symphysis pubis intact. Visualized
lower lumbar spine unremarkable. Opaque material is present external
to the patient overlying the inner thigh.
IMPRESSION: Normal examination.
